# Patient Record
Sex: Male | Born: 1935 | Race: White | Hispanic: No | Marital: Married | Smoking: Former smoker
Health system: Southern US, Community
[De-identification: ages and names within clinical notes are randomized; demographics above are authoritative.]

## PROBLEM LIST (undated history)

## (undated) DIAGNOSIS — C61 Malignant neoplasm of prostate: Secondary | ICD-10-CM

## (undated) DIAGNOSIS — C801 Malignant (primary) neoplasm, unspecified: Secondary | ICD-10-CM

## (undated) DIAGNOSIS — E119 Type 2 diabetes mellitus without complications: Secondary | ICD-10-CM

## (undated) DIAGNOSIS — S32000A Wedge compression fracture of unspecified lumbar vertebra, initial encounter for closed fracture: Secondary | ICD-10-CM

## (undated) DIAGNOSIS — F419 Anxiety disorder, unspecified: Secondary | ICD-10-CM

## (undated) DIAGNOSIS — H269 Unspecified cataract: Secondary | ICD-10-CM

## (undated) DIAGNOSIS — I10 Essential (primary) hypertension: Secondary | ICD-10-CM

## (undated) DIAGNOSIS — G4733 Obstructive sleep apnea (adult) (pediatric): Secondary | ICD-10-CM

## (undated) DIAGNOSIS — F32A Depression, unspecified: Secondary | ICD-10-CM

## (undated) DIAGNOSIS — I951 Orthostatic hypotension: Secondary | ICD-10-CM

## (undated) DIAGNOSIS — I4891 Unspecified atrial fibrillation: Secondary | ICD-10-CM

## (undated) DIAGNOSIS — K219 Gastro-esophageal reflux disease without esophagitis: Secondary | ICD-10-CM

## (undated) DIAGNOSIS — G4730 Sleep apnea, unspecified: Secondary | ICD-10-CM

## (undated) DIAGNOSIS — M25511 Pain in right shoulder: Secondary | ICD-10-CM

## (undated) DIAGNOSIS — E7800 Pure hypercholesterolemia, unspecified: Secondary | ICD-10-CM

## (undated) DIAGNOSIS — M1612 Unilateral primary osteoarthritis, left hip: Secondary | ICD-10-CM

## (undated) DIAGNOSIS — R32 Unspecified urinary incontinence: Secondary | ICD-10-CM

## (undated) DIAGNOSIS — M109 Gout, unspecified: Secondary | ICD-10-CM

## (undated) DIAGNOSIS — N183 Chronic kidney disease, stage 3 unspecified: Secondary | ICD-10-CM

## (undated) HISTORY — DX: Depression, unspecified: F32.A

## (undated) HISTORY — DX: Gout, unspecified: M10.9

## (undated) HISTORY — PX: CHOLECYSTECTOMY, LAPAROSCOPIC: SHX56

## (undated) HISTORY — DX: Unspecified atrial fibrillation: I48.91

## (undated) HISTORY — DX: Malignant (primary) neoplasm, unspecified: C80.1

## (undated) HISTORY — DX: Unspecified urinary incontinence: R32

## (undated) HISTORY — PX: OTHER SURGICAL HISTORY: SHX169

## (undated) HISTORY — DX: Type 2 diabetes mellitus without complications: E11.9

## (undated) HISTORY — DX: Essential (primary) hypertension: I10

## (undated) HISTORY — DX: Unspecified cataract: H26.9

## (undated) HISTORY — DX: Wedge compression fracture of unspecified lumbar vertebra, initial encounter for closed fracture: S32.000A

## (undated) HISTORY — DX: Anxiety disorder, unspecified: F41.9

## (undated) HISTORY — DX: Sleep apnea, unspecified: G47.30

## (undated) HISTORY — DX: Pure hypercholesterolemia, unspecified: E78.00

## (undated) HISTORY — DX: Unilateral primary osteoarthritis, left hip: M16.12

## (undated) HISTORY — DX: Obstructive sleep apnea (adult) (pediatric): G47.33

## (undated) HISTORY — PX: ROBOT ASSISTED LAPAROSCOPIC RADICAL PROSTATECTOMY: SHX5141

## (undated) HISTORY — DX: Pain in right shoulder: M25.511

## (undated) HISTORY — DX: Orthostatic hypotension: I95.1

## (undated) HISTORY — PX: TOTAL KNEE ARTHROPLASTY: SHX125

## (undated) HISTORY — DX: Malignant neoplasm of prostate: C61

## (undated) HISTORY — PX: TRANSTHORACIC ECHOCARDIOGRAM: SHX275

## (undated) HISTORY — PX: ROTATOR CUFF REPAIR: SHX139

## (undated) HISTORY — DX: Gastro-esophageal reflux disease without esophagitis: K21.9

## (undated) HISTORY — DX: Chronic kidney disease, stage 3 unspecified: N18.30

---

## 2010-09-19 DIAGNOSIS — J209 Acute bronchitis, unspecified: Secondary | ICD-10-CM | POA: Insufficient documentation

## 2010-09-19 DIAGNOSIS — J301 Allergic rhinitis due to pollen: Secondary | ICD-10-CM | POA: Insufficient documentation

## 2010-09-19 DIAGNOSIS — J019 Acute sinusitis, unspecified: Secondary | ICD-10-CM | POA: Insufficient documentation

## 2010-09-19 DIAGNOSIS — R059 Cough, unspecified: Secondary | ICD-10-CM | POA: Insufficient documentation

## 2010-09-24 DIAGNOSIS — J9801 Acute bronchospasm: Secondary | ICD-10-CM | POA: Insufficient documentation

## 2016-05-31 DIAGNOSIS — H9193 Unspecified hearing loss, bilateral: Secondary | ICD-10-CM | POA: Insufficient documentation

## 2016-05-31 DIAGNOSIS — Z85828 Personal history of other malignant neoplasm of skin: Secondary | ICD-10-CM | POA: Insufficient documentation

## 2016-05-31 DIAGNOSIS — E559 Vitamin D deficiency, unspecified: Secondary | ICD-10-CM | POA: Insufficient documentation

## 2016-05-31 DIAGNOSIS — F411 Generalized anxiety disorder: Secondary | ICD-10-CM | POA: Insufficient documentation

## 2016-05-31 DIAGNOSIS — Z8546 Personal history of malignant neoplasm of prostate: Secondary | ICD-10-CM | POA: Insufficient documentation

## 2020-02-21 DIAGNOSIS — C61 Malignant neoplasm of prostate: Secondary | ICD-10-CM | POA: Insufficient documentation

## 2020-02-21 DIAGNOSIS — E663 Overweight: Secondary | ICD-10-CM | POA: Insufficient documentation

## 2020-02-21 DIAGNOSIS — I440 Atrioventricular block, first degree: Secondary | ICD-10-CM | POA: Insufficient documentation

## 2020-02-21 DIAGNOSIS — I454 Nonspecific intraventricular block: Secondary | ICD-10-CM | POA: Insufficient documentation

## 2020-08-21 DIAGNOSIS — H52223 Regular astigmatism, bilateral: Secondary | ICD-10-CM | POA: Diagnosis not present

## 2020-08-26 DIAGNOSIS — R918 Other nonspecific abnormal finding of lung field: Secondary | ICD-10-CM | POA: Diagnosis not present

## 2020-08-26 DIAGNOSIS — Z0100 Encounter for examination of eyes and vision without abnormal findings: Secondary | ICD-10-CM | POA: Diagnosis not present

## 2020-08-28 DIAGNOSIS — Z9181 History of falling: Secondary | ICD-10-CM | POA: Diagnosis not present

## 2020-08-28 DIAGNOSIS — E1142 Type 2 diabetes mellitus with diabetic polyneuropathy: Secondary | ICD-10-CM | POA: Diagnosis not present

## 2020-10-28 DIAGNOSIS — E119 Type 2 diabetes mellitus without complications: Secondary | ICD-10-CM | POA: Diagnosis not present

## 2020-10-28 DIAGNOSIS — I4460 Unspecified fascicular block: Secondary | ICD-10-CM | POA: Diagnosis not present

## 2020-10-28 DIAGNOSIS — I712 Thoracic aortic aneurysm, without rupture: Secondary | ICD-10-CM | POA: Diagnosis not present

## 2020-10-28 DIAGNOSIS — I440 Atrioventricular block, first degree: Secondary | ICD-10-CM | POA: Diagnosis not present

## 2020-10-28 DIAGNOSIS — I10 Essential (primary) hypertension: Secondary | ICD-10-CM | POA: Diagnosis not present

## 2020-10-28 DIAGNOSIS — I2510 Atherosclerotic heart disease of native coronary artery without angina pectoris: Secondary | ICD-10-CM | POA: Diagnosis not present

## 2020-10-28 DIAGNOSIS — E7800 Pure hypercholesterolemia, unspecified: Secondary | ICD-10-CM | POA: Diagnosis not present

## 2020-11-04 DIAGNOSIS — E119 Type 2 diabetes mellitus without complications: Secondary | ICD-10-CM | POA: Diagnosis not present

## 2020-12-09 DIAGNOSIS — H16221 Keratoconjunctivitis sicca, not specified as Sjogren's, right eye: Secondary | ICD-10-CM | POA: Diagnosis not present

## 2020-12-18 DIAGNOSIS — E1142 Type 2 diabetes mellitus with diabetic polyneuropathy: Secondary | ICD-10-CM | POA: Diagnosis not present

## 2021-01-15 DIAGNOSIS — Z79899 Other long term (current) drug therapy: Secondary | ICD-10-CM | POA: Diagnosis not present

## 2021-01-15 DIAGNOSIS — E1165 Type 2 diabetes mellitus with hyperglycemia: Secondary | ICD-10-CM | POA: Diagnosis not present

## 2021-01-15 DIAGNOSIS — M109 Gout, unspecified: Secondary | ICD-10-CM | POA: Diagnosis not present

## 2021-01-15 DIAGNOSIS — E7800 Pure hypercholesterolemia, unspecified: Secondary | ICD-10-CM | POA: Diagnosis not present

## 2021-01-15 DIAGNOSIS — E559 Vitamin D deficiency, unspecified: Secondary | ICD-10-CM | POA: Diagnosis not present

## 2021-01-29 DIAGNOSIS — M24812 Other specific joint derangements of left shoulder, not elsewhere classified: Secondary | ICD-10-CM | POA: Diagnosis not present

## 2021-01-29 DIAGNOSIS — I712 Thoracic aortic aneurysm, without rupture: Secondary | ICD-10-CM | POA: Diagnosis not present

## 2021-01-29 DIAGNOSIS — Z8546 Personal history of malignant neoplasm of prostate: Secondary | ICD-10-CM | POA: Diagnosis not present

## 2021-01-29 DIAGNOSIS — L570 Actinic keratosis: Secondary | ICD-10-CM | POA: Diagnosis not present

## 2021-01-29 DIAGNOSIS — M109 Gout, unspecified: Secondary | ICD-10-CM | POA: Diagnosis not present

## 2021-01-29 DIAGNOSIS — I10 Essential (primary) hypertension: Secondary | ICD-10-CM | POA: Diagnosis not present

## 2021-01-29 DIAGNOSIS — E113299 Type 2 diabetes mellitus with mild nonproliferative diabetic retinopathy without macular edema, unspecified eye: Secondary | ICD-10-CM | POA: Diagnosis not present

## 2021-01-29 DIAGNOSIS — I700 Atherosclerosis of aorta: Secondary | ICD-10-CM | POA: Diagnosis not present

## 2021-01-29 DIAGNOSIS — Z1211 Encounter for screening for malignant neoplasm of colon: Secondary | ICD-10-CM | POA: Diagnosis not present

## 2021-01-29 DIAGNOSIS — M1990 Unspecified osteoarthritis, unspecified site: Secondary | ICD-10-CM | POA: Diagnosis not present

## 2021-01-29 DIAGNOSIS — E1140 Type 2 diabetes mellitus with diabetic neuropathy, unspecified: Secondary | ICD-10-CM | POA: Diagnosis not present

## 2021-01-29 DIAGNOSIS — M25819 Other specified joint disorders, unspecified shoulder: Secondary | ICD-10-CM | POA: Diagnosis not present

## 2021-01-29 DIAGNOSIS — Z0000 Encounter for general adult medical examination without abnormal findings: Secondary | ICD-10-CM | POA: Diagnosis not present

## 2021-01-29 DIAGNOSIS — N183 Chronic kidney disease, stage 3 unspecified: Secondary | ICD-10-CM | POA: Diagnosis not present

## 2021-01-29 DIAGNOSIS — H8112 Benign paroxysmal vertigo, left ear: Secondary | ICD-10-CM | POA: Diagnosis not present

## 2021-01-29 DIAGNOSIS — G4733 Obstructive sleep apnea (adult) (pediatric): Secondary | ICD-10-CM | POA: Diagnosis not present

## 2021-01-29 DIAGNOSIS — E7800 Pure hypercholesterolemia, unspecified: Secondary | ICD-10-CM | POA: Diagnosis not present

## 2021-01-29 DIAGNOSIS — Z125 Encounter for screening for malignant neoplasm of prostate: Secondary | ICD-10-CM | POA: Diagnosis not present

## 2021-01-29 DIAGNOSIS — E559 Vitamin D deficiency, unspecified: Secondary | ICD-10-CM | POA: Diagnosis not present

## 2021-01-29 DIAGNOSIS — E1139 Type 2 diabetes mellitus with other diabetic ophthalmic complication: Secondary | ICD-10-CM | POA: Diagnosis not present

## 2021-01-29 DIAGNOSIS — T783XXA Angioneurotic edema, initial encounter: Secondary | ICD-10-CM | POA: Diagnosis not present

## 2021-01-29 DIAGNOSIS — H9193 Unspecified hearing loss, bilateral: Secondary | ICD-10-CM | POA: Diagnosis not present

## 2021-01-29 DIAGNOSIS — K219 Gastro-esophageal reflux disease without esophagitis: Secondary | ICD-10-CM | POA: Diagnosis not present

## 2021-01-29 DIAGNOSIS — Z8673 Personal history of transient ischemic attack (TIA), and cerebral infarction without residual deficits: Secondary | ICD-10-CM | POA: Diagnosis not present

## 2021-01-29 DIAGNOSIS — R69 Illness, unspecified: Secondary | ICD-10-CM | POA: Diagnosis not present

## 2021-01-29 DIAGNOSIS — E1122 Type 2 diabetes mellitus with diabetic chronic kidney disease: Secondary | ICD-10-CM | POA: Diagnosis not present

## 2021-01-29 DIAGNOSIS — Z85828 Personal history of other malignant neoplasm of skin: Secondary | ICD-10-CM | POA: Diagnosis not present

## 2021-01-29 DIAGNOSIS — Z79899 Other long term (current) drug therapy: Secondary | ICD-10-CM | POA: Diagnosis not present

## 2021-01-29 DIAGNOSIS — E1142 Type 2 diabetes mellitus with diabetic polyneuropathy: Secondary | ICD-10-CM | POA: Diagnosis not present

## 2021-03-27 DIAGNOSIS — W1830XA Fall on same level, unspecified, initial encounter: Secondary | ICD-10-CM | POA: Diagnosis not present

## 2021-03-27 DIAGNOSIS — Z743 Need for continuous supervision: Secondary | ICD-10-CM | POA: Diagnosis not present

## 2021-03-27 DIAGNOSIS — R220 Localized swelling, mass and lump, head: Secondary | ICD-10-CM | POA: Diagnosis not present

## 2021-03-27 DIAGNOSIS — S0990XA Unspecified injury of head, initial encounter: Secondary | ICD-10-CM | POA: Diagnosis not present

## 2021-03-27 DIAGNOSIS — R55 Syncope and collapse: Secondary | ICD-10-CM | POA: Diagnosis not present

## 2021-03-27 DIAGNOSIS — Y92511 Restaurant or cafe as the place of occurrence of the external cause: Secondary | ICD-10-CM | POA: Diagnosis not present

## 2021-03-27 DIAGNOSIS — I440 Atrioventricular block, first degree: Secondary | ICD-10-CM | POA: Diagnosis not present

## 2021-03-27 DIAGNOSIS — R42 Dizziness and giddiness: Secondary | ICD-10-CM | POA: Diagnosis not present

## 2021-03-27 DIAGNOSIS — I4510 Unspecified right bundle-branch block: Secondary | ICD-10-CM | POA: Diagnosis not present

## 2021-09-09 ENCOUNTER — Encounter: Payer: Self-pay | Admitting: Family Medicine

## 2021-09-09 ENCOUNTER — Ambulatory Visit (INDEPENDENT_AMBULATORY_CARE_PROVIDER_SITE_OTHER): Payer: Medicare HMO | Admitting: Family Medicine

## 2021-09-09 VITALS — BP 138/82 | HR 100 | Temp 98.1°F | Resp 16 | Ht 73.0 in | Wt 268.4 lb

## 2021-09-09 DIAGNOSIS — R42 Dizziness and giddiness: Secondary | ICD-10-CM | POA: Diagnosis not present

## 2021-09-09 DIAGNOSIS — I712 Thoracic aortic aneurysm, without rupture, unspecified: Secondary | ICD-10-CM

## 2021-09-09 DIAGNOSIS — M109 Gout, unspecified: Secondary | ICD-10-CM

## 2021-09-09 DIAGNOSIS — I10 Essential (primary) hypertension: Secondary | ICD-10-CM

## 2021-09-09 DIAGNOSIS — E1169 Type 2 diabetes mellitus with other specified complication: Secondary | ICD-10-CM

## 2021-09-09 DIAGNOSIS — E669 Obesity, unspecified: Secondary | ICD-10-CM

## 2021-09-09 DIAGNOSIS — Z9181 History of falling: Secondary | ICD-10-CM | POA: Diagnosis not present

## 2021-09-09 DIAGNOSIS — E785 Hyperlipidemia, unspecified: Secondary | ICD-10-CM | POA: Diagnosis not present

## 2021-09-09 LAB — MICROALBUMIN / CREATININE URINE RATIO
Creatinine,U: 243 mg/dL
Microalb Creat Ratio: 17.4 mg/g (ref 0.0–30.0)
Microalb, Ur: 42.4 mg/dL — ABNORMAL HIGH (ref 0.0–1.9)

## 2021-09-09 LAB — COMPREHENSIVE METABOLIC PANEL
ALT: 16 U/L (ref 0–53)
AST: 17 U/L (ref 0–37)
Albumin: 3.9 g/dL (ref 3.5–5.2)
Alkaline Phosphatase: 84 U/L (ref 39–117)
BUN: 18 mg/dL (ref 6–23)
CO2: 24 mEq/L (ref 19–32)
Calcium: 9.2 mg/dL (ref 8.4–10.5)
Chloride: 100 mEq/L (ref 96–112)
Creatinine, Ser: 1.4 mg/dL (ref 0.40–1.50)
GFR: 46.42 mL/min — ABNORMAL LOW (ref >60.00)
Glucose, Bld: 361 mg/dL — ABNORMAL HIGH (ref 70–99)
Potassium: 4.2 mEq/L (ref 3.5–5.1)
Sodium: 135 mEq/L (ref 135–145)
Total Bilirubin: 0.6 mg/dL (ref 0.2–1.2)
Total Protein: 6.7 g/dL (ref 6.0–8.3)

## 2021-09-09 LAB — CBC
HCT: 43.2 % (ref 39.0–52.0)
Hemoglobin: 13.8 g/dL (ref 13.0–17.0)
MCHC: 31.9 g/dL (ref 30.0–36.0)
MCV: 90.4 fl (ref 78.0–100.0)
Platelets: 289 10*3/uL (ref 150.0–400.0)
RBC: 4.78 Mil/uL (ref 4.22–5.81)
RDW: 15 % (ref 11.5–15.5)
WBC: 7.1 10*3/uL (ref 4.0–10.5)

## 2021-09-09 LAB — LIPID PANEL
Cholesterol: 139 mg/dL (ref 0–200)
HDL: 37.4 mg/dL — ABNORMAL LOW (ref >39.00)
LDL Cholesterol: 72 mg/dL (ref 0–99)
NonHDL: 101.91
Total CHOL/HDL Ratio: 4
Triglycerides: 149 mg/dL (ref 0.0–149.0)
VLDL: 29.8 mg/dL (ref 0.0–40.0)

## 2021-09-09 LAB — HEMOGLOBIN A1C: Hgb A1c MFr Bld: 7.5 % — ABNORMAL HIGH (ref 4.6–6.5)

## 2021-09-09 MED ORDER — METOPROLOL TARTRATE 25 MG PO TABS: 25.0000 mg | ORAL_TABLET | Freq: Two times a day (BID) | ORAL | 1 refills | Status: DC

## 2021-09-09 MED ORDER — MECLIZINE HCL 12.5 MG PO TABS: 12.5000 mg | ORAL_TABLET | Freq: Three times a day (TID) | ORAL | 1 refills | Status: DC

## 2021-09-09 MED ORDER — GLIMEPIRIDE 4 MG PO TABS: 4.0000 mg | ORAL_TABLET | Freq: Two times a day (BID) | ORAL | 1 refills | Status: DC

## 2021-09-09 MED ORDER — OMEPRAZOLE 20 MG PO CPDR: 20.0000 mg | DELAYED_RELEASE_CAPSULE | Freq: Every day | ORAL | 1 refills | Status: DC

## 2021-09-09 MED ORDER — ALLOPURINOL 300 MG PO TABS: 300.0000 mg | ORAL_TABLET | Freq: Every day | ORAL | 1 refills | Status: DC

## 2021-09-09 MED ORDER — PIOGLITAZONE HCL 15 MG PO TABS: 15.0000 mg | ORAL_TABLET | Freq: Every day | ORAL | 1 refills | Status: DC

## 2021-09-09 MED ORDER — ATORVASTATIN CALCIUM 40 MG PO TABS: 40.0000 mg | ORAL_TABLET | Freq: Every day | ORAL | 1 refills | Status: DC

## 2021-09-09 MED ORDER — METFORMIN HCL ER 500 MG PO TB24
1000.0000 mg | ORAL_TABLET | Freq: Two times a day (BID) | ORAL | 1 refills | Status: DC
Start: 2021-09-09 — End: 2022-01-15

## 2021-09-09 MED ORDER — LOSARTAN POTASSIUM 100 MG PO TABS: 100.0000 mg | ORAL_TABLET | Freq: Every day | ORAL | 1 refills | Status: DC

## 2021-09-09 NOTE — Patient Instructions (Addendum)
We will need to get a copy of prior CT, but will refer you to cardiology for ongoing monitoring.  Updated labs today, no med changes for now.  I will refer you to ENT to discuss the vertigo/dizziness.  Return to the clinic or go to the nearest emergency room if any of your symptoms worsen or new symptoms occur.  Recheck in 3 months depending on labs.   Thanks for coming in today and nice meeting you.   Fall Prevention in the Home, Adult Falls can cause injuries and affect people of all ages. There are many simple things that you can do to make your home safe and to help prevent falls. Ask for help when making these changes, if needed. What actions can I take to prevent falls? General instructions Use good lighting in all rooms. Replace any light bulbs that burn out, turn on lights if it is dark, and use night-lights. Place frequently used items in easy-to-reach places. Lower the shelves around your home if necessary. Set up furniture so that there are clear paths around it. Avoid moving your furniture around. Remove throw rugs and other tripping hazards from the floor. Avoid walking on wet floors. Fix any uneven floor surfaces. Add color or contrast paint or tape to grab bars and handrails in your home. Place contrasting color strips on the first and last steps of staircases. When you use a stepladder, make sure that it is completely opened and that the sides and supports are firmly locked. Have someone hold the ladder while you are using it. Do not climb a closed stepladder. Know where your pets are when moving through your home. What can I do in the bathroom?   Keep the floor dry. Immediately clean up any water that is on the floor. Remove soap buildup in the tub or shower regularly. Use nonskid mats or decals on the floor of the tub or shower. Attach bath mats securely with double-sided, nonslip rug tape. If you need to sit down while you are in the shower, use a plastic, nonslip  stool. Install grab bars by the toilet and in the tub and shower. Do not use towel bars as grab bars. What can I do in the bedroom? Make sure that a bedside light is easy to reach. Do not use oversized bedding that reaches the floor. Have a firm chair that has side arms to use for getting dressed. What can I do in the kitchen? Clean up any spills right away. If you need to reach for something above you, use a sturdy step stool that has a grab bar. Keep electrical cables out of the way. Do not use floor polish or wax that makes floors slippery. If you must use wax, make sure that it is non-skid floor wax. What can I do with my stairs? Do not leave any items on the stairs. Make sure that you have a light switch at the top and the bottom of the stairs. Have them installed if you do not have them. Make sure that there are handrails on both sides of the stairs. Fix handrails that are broken or loose. Make sure that handrails are as long as the staircases. Install non-slip stair treads on all stairs in your home. Avoid having throw rugs at the top or bottom of stairs, or secure the rugs with carpet tape to prevent them from moving. Choose a carpet design that does not hide the edge of steps on the stairs. Check any carpeting to  make sure that it is firmly attached to the stairs. Fix any carpet that is loose or worn. What can I do on the outside of my home? Use bright outdoor lighting. Regularly repair the edges of walkways and driveways and fix any cracks. Remove high doorway thresholds. Trim any shrubbery on the main path into your home. Regularly check that handrails are securely fastened and in good repair. Both sides of all steps should have handrails. Install guardrails along the edges of any raised decks or porches. Clear walkways of debris and clutter, including tools and rocks. Have leaves, snow, and ice cleared regularly. Use sand or salt on walkways during winter months. In the  garage, clean up any spills right away, including grease or oil spills. What other actions can I take? Wear closed-toe shoes that fit well and support your feet. Wear shoes that have rubber soles or low heels. Use mobility aids as needed, such as canes, walkers, scooters, and crutches. Review your medicines with your health care provider. Some medicines can cause dizziness or changes in blood pressure, which increase your risk of falling. Talk with your health care provider about other ways that you can decrease your risk of falls. This may include working with a physical therapist or trainer to improve your strength, balance, and endurance. Where to find more information Centers for Disease Control and Prevention, STEADI: http://www.wolf.info/ National Institute on Aging: http://kim-miller.com/ Contact a health care provider if: You are afraid of falling at home. You feel weak, drowsy, or dizzy at home. You fall at home. Summary There are many simple things that you can do to make your home safe and to help prevent falls. Ways to make your home safe include removing tripping hazards and installing grab bars in the bathroom. Ask for help when making these changes in your home. This information is not intended to replace advice given to you by your health care provider. Make sure you discuss any questions you have with your health care provider. Document Revised: 03/05/2020 Document Reviewed: 03/05/2020 Elsevier Patient Education  [place].

## 2021-09-09 NOTE — Progress Notes (Signed)
Subjective:  Patient ID: Jason [name], male    DOB: [birth year]  Age: 86 y.o. MRN: [id number]  CC:  Chief Complaint  Patient presents with   New Patient (Initial Visit)    Pt here to establish care, reports moved here from [facility] reports he is due for several refills today     HPI Jason [name] presents for   New patient to establish care. Prior  PCP in [place]. Moved from [name], [name]. Last visit 6-8 months ago. Moved here to be closer to family - daughter here locally.  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  Prior Social research officer, government - 4 years.   Hypertension: Losartan 100 mg daily, Lopressor 25 mg twice daily Hx of OSA on CPAP nightly. Working well.  No new med side effects.  Home readings: diastolic in 30'S.  No hx of MI/PTCA/CAD known  BP Readings from Last 3 Encounters:  09/09/21 138/82   No results found for: CREATININE  Hyperlipidemia: Lipitor 40mg  qd. No new myalgias/side effects.  No results found for: CHOL, HDL, LDLCALC, LDLDIRECT, TRIG, CHOLHDL No results found for: ALT, AST, GGT, ALKPHOS, BILITOT  GERD Controlled with daily omeprazole. Takes B12 supplement otc QD. Vit D supplement otc.   Diabetes: With obesity, no other complications.  Treated with Metformin, amaryl, actos.  On statin, ARB.  Home readings:  Fasting 158 today.  Postprandial - none.  No symptomatic lows.   No results found for: HGBA1C No results found for: GLUF, MICROALBUR, LDLCALC, CREATININE  Gout: Last flare: none recently.  Daily meds:allopurinol 300mg  qd.  Prn med: none needed.  No results found for: LABURIC  Vertigo: Past few years, with falls in past. Has discussed with prior PCP - treated with meclizine. Unsure if helps. Room spinning sensation.  Dizzy at times. Resolves with rest, no CP or palpitations.  No change in hearing - wears hearing aids. 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 Improves with hearing aids.   Aortic  aneurysm: Thoracic, Found on imaging in past, treated by cardiology in [place]. Had CT, advised he is due for repeat CT chest. Unknown prior measurements.   Health maintenance: Flu vaccine in September 2022.  Covid vaccine and booster, most recent booster 06/26/20 - bivalent booster recommended. Plans to receive.   History There are no problems to display for this patient.  Past Medical History:  Diagnosis Date   Cancer ([place])    Cataract    Diabetes mellitus without complication ([place])    Hypertension    Sleep apnea    History reviewed. No pertinent surgical history. No Known Allergies Prior to Admission medications   Medication Sig Start Date End Date Taking? Authorizing Provider  allopurinol (ZYLOPRIM) 300 MG tablet Take 1 tablet by mouth daily. 06/07/12  Yes [provider]  Ascorbic Acid (VITAMIN C) 100 MG tablet Take 100 mg by mouth daily.   Yes [provider]  aspirin 81 MG chewable tablet Chew by mouth daily.   Yes [provider]  atorvastatin (LIPITOR) 40 MG tablet Take 1 tablet by mouth daily. 05/01/21  Yes [provider]  Cholecalciferol (VITAMIN D3) 1.25 MG (50000 UT) CAPS Take by mouth.   Yes [provider]  Cobalamin Combinations (B-12) [phone] MCG SUBL Take 1 tablet by mouth daily.   Yes [provider]  glimepiride (AMARYL) 4 MG tablet Take 1 tablet by mouth in the morning and at bedtime. 05/06/20  Yes [provider]  losartan (COZAAR) 100 MG tablet Take 1 tablet by mouth daily. 02/21/20  Yes [provider]  meclizine (ANTIVERT) 12.5 MG tablet Take 1 tablet by mouth in the morning, at noon, and at bedtime.   Yes [provider]  metFORMIN (GLUCOPHAGE-XR) 500 MG 24 hr tablet Take 1,000 mg by mouth in the morning and at bedtime. 05/01/21  Yes [provider]  metoprolol tartrate (LOPRESSOR) 25 MG tablet Take 25 mg by mouth 2 (two) times daily. 08/25/21  Yes [provider]   omeprazole (PRILOSEC) 20 MG capsule Take 20 mg by mouth daily. 08/18/21  Yes [provider]  pioglitazone (ACTOS) 15 MG tablet Take 1 tablet by mouth daily.   Yes [provider]   Social History   Socioeconomic History   Marital status: Married    Spouse name: Not on file   Number of children: Not on file   Years of education: Not on file   Highest education level: Not on file  Occupational History   Not on file  Tobacco Use   Smoking status: Former    Types: Pipe   Smokeless tobacco: Never  Substance and Sexual Activity   Alcohol use: Yes    Alcohol/week: 2.0 standard drinks    Types: 2 Glasses of wine per week   Drug use: Never   Sexual activity: Never  Other Topics Concern   Not on file  Social History Narrative   Not on file   Social Determinants of Health   Financial Resource Strain: Not on file  Food Insecurity: Not on file  Transportation Needs: Not on file  Physical Activity: Not on file  Stress: Not on file  Social Connections: Not on file  Intimate Partner Violence: Not on file    Review of Systems  Constitutional:  Negative for fatigue and unexpected weight change.  HENT:  Positive for tinnitus.   Eyes:  Negative for visual disturbance.  Respiratory:  Negative for cough, chest tightness and shortness of breath.   Cardiovascular:  Negative for chest pain, palpitations and leg swelling.  Gastrointestinal:  Negative for abdominal pain and blood in stool.  Neurological:  Positive for dizziness (vertigo,  intermittent.). Negative for facial asymmetry, speech difficulty, weakness, light-headedness and headaches.    Objective:   Vitals:   09/09/21 0920  BP: 138/82  Pulse: 100  Resp: 16  Temp: 98.1 F (36.7 C)  TempSrc: Temporal  SpO2: 97%  Weight: 268 lb 6.4 oz (121.7 kg)  Height: 6\' 1"  (1.854 m)     Physical Exam Vitals reviewed.  Constitutional:      Appearance: He is well-developed. He is obese.  HENT:     Head:  Normocephalic and atraumatic.     Right Ear: Ear canal and external ear normal.     Left Ear: Ear canal and external ear normal.  Eyes:     Extraocular Movements: Extraocular movements intact.     Pupils: Pupils are equal, round, and reactive to light.  Neck:     Vascular: No carotid bruit or JVD.  Cardiovascular:     Rate and Rhythm: Normal rate and regular rhythm.     Heart sounds: Normal heart sounds. No murmur heard. Pulmonary:     Effort: Pulmonary effort is normal.     Breath sounds: Normal breath sounds. No rales.  Abdominal:     General: Abdomen is flat.     Tenderness: There is no abdominal tenderness.  Musculoskeletal:  Right lower leg: No edema.     Left lower leg: No edema.  Skin:    General: Skin is warm and dry.  Neurological:     General: No focal deficit present.     Mental Status: He is alert and oriented to person, place, and time.     Cranial Nerves: No dysarthria.     Motor: No weakness or pronator drift.     Coordination: Finger-Nose-Finger Test and Heel to [place] Test normal.     Gait: Gait is intact.  Psychiatric:        Mood and Affect: Mood normal.        Behavior: Behavior normal.       Assessment & Plan:  Jason [name] is a 86 y.o. male . Type 2 diabetes mellitus with obesity ([place]) - Plan: Comprehensive metabolic panel, Hemoglobin A1c, Microalbumin / creatinine urine ratio, glimepiride (AMARYL) 4 MG tablet  -Check updated labs.  Continue same med regimen for now.  Essential hypertension - Plan: Comprehensive metabolic panel  -Stable, continue same meds, check labs  Hyperlipidemia, unspecified hyperlipidemia type - Plan: Lipid panel, atorvastatin (LIPITOR) 40 MG tablet  - tolerating statin, continue same dose.  Check lipids.  Vertigo - Plan: Ambulatory referral to ENT, CBC, meclizine (ANTIVERT) 12.5 MG tablet History of fall  -Reports history of vertigo diagnosis but with persistent symptoms, recurrence of falls, will refer to ENT for further  evaluation and decision on other testing or imaging.  Nonfocal exam at present.  Continue meclizine for now.  RTC/ER precautions and fall precautions given on handout.  Thoracic aortic aneurysm without rupture, unspecified part - Plan: Ambulatory referral to Cardiology  -Refer to cardiology for ongoing monitoring and likely will need repeat imaging ordered.  Gout, unspecified cause, unspecified chronicity, unspecified site - Plan: allopurinol (ZYLOPRIM) 300 MG tablet  -Stable without recent flare on allopurinol.  Continue same.  Meds ordered this encounter  Medications   glimepiride (AMARYL) 4 MG tablet    Sig: Take 1 tablet (4 mg total) by mouth in the morning and at bedtime.    Dispense:  180 tablet    Refill:  1   meclizine (ANTIVERT) 12.5 MG tablet    Sig: Take 1 tablet (12.5 mg total) by mouth in the morning, at noon, and at bedtime.    Dispense:  90 tablet    Refill:  1   allopurinol (ZYLOPRIM) 300 MG tablet    Sig: Take 1 tablet (300 mg total) by mouth daily.    Dispense:  90 tablet    Refill:  1   atorvastatin (LIPITOR) 40 MG tablet    Sig: Take 1 tablet (40 mg total) by mouth daily.    Dispense:  90 tablet    Refill:  1   losartan (COZAAR) 100 MG tablet    Sig: Take 1 tablet (100 mg total) by mouth daily.    Dispense:  90 tablet    Refill:  1   pioglitazone (ACTOS) 15 MG tablet    Sig: Take 1 tablet (15 mg total) by mouth daily.    Dispense:  90 tablet    Refill:  1   metFORMIN (GLUCOPHAGE-XR) 500 MG 24 hr tablet    Sig: Take 2 tablets (1,000 mg total) by mouth in the morning and at bedtime.    Dispense:  360 tablet    Refill:  1   omeprazole (PRILOSEC) 20 MG capsule    Sig: Take 1 capsule (20 mg total) by mouth  daily.    Dispense:  90 capsule    Refill:  1   metoprolol tartrate (LOPRESSOR) 25 MG tablet    Sig: Take 1 tablet (25 mg total) by mouth 2 (two) times daily.    Dispense:  180 tablet    Refill:  1   Patient Instructions  We will need to get a copy of  prior CT, but will refer you to cardiology for ongoing monitoring.  Updated labs today, no med changes for now.  I will refer you to ENT to discuss the vertigo/dizziness.  Return to the clinic or go to the nearest emergency room if any of your symptoms worsen or new symptoms occur.  Recheck in 3 months depending on labs.   Thanks for coming in today and nice meeting you.   Fall Prevention in the Home, Adult Falls can cause injuries and affect people of all ages. There are many simple things that you can do to make your home safe and to help prevent falls. Ask for help when making these changes, if needed. What actions can I take to prevent falls? General instructions Use good lighting in all rooms. Replace any light bulbs that burn out, turn on lights if it is dark, and use night-lights. Place frequently used items in easy-to-reach places. Lower the shelves around your home if necessary. Set up furniture so that there are clear paths around it. Avoid moving your furniture around. Remove throw rugs and other tripping hazards from the floor. Avoid walking on wet floors. Fix any uneven floor surfaces. Add color or contrast paint or tape to grab bars and handrails in your home. Place contrasting color strips on the first and last steps of staircases. When you use a stepladder, make sure that it is completely opened and that the sides and supports are firmly locked. Have someone hold the ladder while you are using it. Do not climb a closed stepladder. Know where your pets are when moving through your home. What can I do in the bathroom?   Keep the floor dry. Immediately clean up any water that is on the floor. Remove soap buildup in the tub or shower regularly. Use nonskid mats or decals on the floor of the tub or shower. Attach bath mats securely with double-sided, nonslip rug tape. If you need to sit down while you are in the shower, use a plastic, nonslip stool. Install grab bars by the  toilet and in the tub and shower. Do not use towel bars as grab bars. What can I do in the bedroom? Make sure that a bedside light is easy to reach. Do not use oversized bedding that reaches the floor. Have a firm chair that has side arms to use for getting dressed. What can I do in the kitchen? Clean up any spills right away. If you need to reach for something above you, use a sturdy step stool that has a grab bar. Keep electrical cables out of the way. Do not use floor polish or wax that makes floors slippery. If you must use wax, make sure that it is non-skid floor wax. What can I do with my stairs? Do not leave any items on the stairs. Make sure that you have a light switch at the top and the bottom of the stairs. Have them installed if you do not have them. Make sure that there are handrails on both sides of the stairs. Fix handrails that are broken or loose. Make sure that handrails are  as long as the staircases. Install non-slip stair treads on all stairs in your home. Avoid having throw rugs at the top or bottom of stairs, or secure the rugs with carpet tape to prevent them from moving. Choose a carpet design that does not hide the edge of steps on the stairs. Check any carpeting to make sure that it is firmly attached to the stairs. Fix any carpet that is loose or worn. What can I do on the outside of my home? Use bright outdoor lighting. Regularly repair the edges of walkways and driveways and fix any cracks. Remove high doorway thresholds. Trim any shrubbery on the main path into your home. Regularly check that handrails are securely fastened and in good repair. Both sides of all steps should have handrails. Install guardrails along the edges of any raised decks or porches. Clear walkways of debris and clutter, including tools and rocks. Have leaves, snow, and ice cleared regularly. Use sand or salt on walkways during winter months. In the garage, clean up any spills right  away, including grease or oil spills. What other actions can I take? Wear closed-toe shoes that fit well and support your feet. Wear shoes that have rubber soles or low heels. Use mobility aids as needed, such as canes, walkers, scooters, and crutches. Review your medicines with your health care provider. Some medicines can cause dizziness or changes in blood pressure, which increase your risk of falling. Talk with your health care provider about other ways that you can decrease your risk of falls. This may include working with a physical therapist or trainer to improve your strength, balance, and endurance. Where to find more information Centers for Disease Control and Prevention, STEADI: http://www.wolf.info/ National Institute on Aging: http://kim-miller.com/ Contact a health care provider if: You are afraid of falling at home. You feel weak, drowsy, or dizzy at home. You fall at home. Summary There are many simple things that you can do to make your home safe and to help prevent falls. Ways to make your home safe include removing tripping hazards and installing grab bars in the bathroom. Ask for help when making these changes in your home. This information is not intended to replace advice given to you by your health care provider. Make sure you discuss any questions you have with your health care provider. Document Revised: 03/05/2020 Document Reviewed: 03/05/2020 Elsevier Patient Education  2022 [place],   Merri Ray, MD [place], [facility] 09/09/21 10:31 AM

## 2021-11-05 ENCOUNTER — Encounter (HOSPITAL_BASED_OUTPATIENT_CLINIC_OR_DEPARTMENT_OTHER): Payer: Self-pay

## 2021-11-09 DIAGNOSIS — I951 Orthostatic hypotension: Secondary | ICD-10-CM | POA: Insufficient documentation

## 2021-11-09 DIAGNOSIS — H903 Sensorineural hearing loss, bilateral: Secondary | ICD-10-CM | POA: Insufficient documentation

## 2021-11-09 DIAGNOSIS — R2689 Other abnormalities of gait and mobility: Secondary | ICD-10-CM | POA: Diagnosis not present

## 2021-11-12 DIAGNOSIS — H353131 Nonexudative age-related macular degeneration, bilateral, early dry stage: Secondary | ICD-10-CM | POA: Diagnosis not present

## 2021-12-09 ENCOUNTER — Ambulatory Visit (INDEPENDENT_AMBULATORY_CARE_PROVIDER_SITE_OTHER): Payer: Medicare HMO

## 2021-12-09 DIAGNOSIS — Z0000 Encounter for general adult medical examination without abnormal findings: Secondary | ICD-10-CM

## 2021-12-09 NOTE — Progress Notes (Signed)
? ?Subjective:  ? Jason [name] is a 86 y.o. male who presents for an subsequent  Medicare Annual Wellness Visit. ? ?I connected with Andric Kerce today by telephone and verified that I am speaking with the correct person using two identifiers. ?Location patient: home ?Location provider: work ?Persons participating in the virtual visit: patient, provider. ?  ?I discussed the limitations, risks, security and privacy concerns of performing an evaluation and management service by telephone and the availability of in person appointments. I also discussed with the patient that there may be a patient responsible charge related to this service. The patient expressed understanding and verbally consented to this telephonic visit.  ?  ?Interactive audio and video telecommunications were attempted between this provider and patient, however failed, due to patient having technical difficulties OR patient did not have access to video capability.  We continued and completed visit with audio only. ? ?  ?Review of Systems    ? ?Cardiac Risk Factors include: advanced age (>65mn, >>30women);diabetes mellitus;dyslipidemia;male gender;hypertension ? ?   ?Objective:  ?  ?Today's Vitals  ? ?There is no height or weight on file to calculate BMI. ? ? ?  12/09/2021  ?  1:12 PM  ?Advanced Directives  ?Does Patient Have a Medical Advance Directive? Yes  ?Type of [place] will  ?[place] of [place]? No - copy requested  ? ? ?Current Medications (verified) ?Outpatient Encounter Medications as of 12/09/2021  ?Medication Sig  ? allopurinol (ZYLOPRIM) 300 MG tablet Take 1 tablet (300 mg total) by mouth daily.  ? Ascorbic Acid (VITAMIN C) 100 MG tablet Take 100 mg by mouth daily.  ? aspirin 81 MG chewable tablet Chew by mouth daily.  ? atorvastatin (LIPITOR) 40 MG tablet Take 1 tablet (40 mg total) by mouth daily.  ? Cholecalciferol (VITAMIN D3) 1.25 MG (50000 UT) CAPS Take by mouth.  ?  Cobalamin Combinations (B-12) [phone] MCG SUBL Take 1 tablet by mouth daily.  ? glimepiride (AMARYL) 4 MG tablet Take 1 tablet (4 mg total) by mouth in the morning and at bedtime.  ? losartan (COZAAR) 100 MG tablet Take 1 tablet (100 mg total) by mouth daily.  ? meclizine (ANTIVERT) 12.5 MG tablet Take 1 tablet (12.5 mg total) by mouth in the morning, at noon, and at bedtime.  ? metFORMIN (GLUCOPHAGE-XR) 500 MG 24 hr tablet Take 2 tablets (1,000 mg total) by mouth in the morning and at bedtime.  ? metoprolol tartrate (LOPRESSOR) 25 MG tablet Take 1 tablet (25 mg total) by mouth 2 (two) times daily. (Patient taking differently: Take 50 mg by mouth 2 (two) times daily.)  ? omeprazole (PRILOSEC) 20 MG capsule Take 1 capsule (20 mg total) by mouth daily.  ? pioglitazone (ACTOS) 15 MG tablet Take 1 tablet (15 mg total) by mouth daily.  ? ?No facility-administered encounter medications on file as of 12/09/2021.  ? ? ?Allergies (verified) ?Patient has no known allergies.  ? ?History: ?Past Medical History:  ?Diagnosis Date  ? Cancer ([facility]   ? Cataract   ? Diabetes mellitus without complication ([place]   ? Hypertension   ? Sleep apnea   ? ?History reviewed. No pertinent surgical history. ?Family History  ?Problem Relation Age of Onset  ? Cancer Father   ? Cancer Sister   ? ?Social History  ? ?Socioeconomic History  ? Marital status: Married  ?  Spouse name: Not on file  ? Number of children: Not  on file  ? Years of education: Not on file  ? Highest education level: Not on file  ?Occupational History  ? Not on file  ?Tobacco Use  ? Smoking status: Former  ?  Types: Pipe  ? Smokeless tobacco: Never  ?Substance and Sexual Activity  ? Alcohol use: Yes  ?  Alcohol/week: 2.0 standard drinks  ?  Types: 2 Glasses of wine per week  ? Drug use: Never  ? Sexual activity: Never  ?Other Topics Concern  ? Not on file  ?Social History Narrative  ? Not on file  ? ?Social Determinants of Health  ? ?Financial Resource Strain: Low Risk   ?  Difficulty of Paying Living Expenses: Not hard at all  ?Food Insecurity: No Food Insecurity  ? Worried About Charity fundraiser in the Last Year: Never true  ? Ran Out of Food in the Last Year: Never true  ?Transportation Needs: No Transportation Needs  ? Lack of Transportation (Medical): No  ? Lack of Transportation (Non-Medical): No  ?Physical Activity: Insufficiently Active  ? Days of Exercise per Week: 2 days  ? Minutes of Exercise per Session: 30 min  ?Stress: No Stress Concern Present  ? Feeling of Stress : Not at all  ?Social Connections: Moderately Integrated  ? Frequency of Communication with Friends and Family: Three times a week  ? Frequency of Social Gatherings with Friends and Family: Three times a week  ? Attends Religious Services: Never  ? Active Member of Clubs or Organizations: Yes  ? Attends Archivist Meetings: More than 4 times per year  ? Marital Status: Married  ? ? ?Tobacco Counseling ?Counseling given: Not Answered ? ? ?Clinical Intake: ? ?Pre-visit preparation completed: Yes ? ?Pain : No/denies pain ? ?  ? ?Nutritional Risks: None ?Diabetes: Yes ?CBG done?: No ?Did pt. bring in CBG monitor from home?: No ? ?How often do you need to have someone help you when you read instructions, pamphlets, or other written materials from your doctor or pharmacy?: 1 - Never ?What is the last grade level you completed in school?: PHD ? ?Diabetic?yes ?Nutrition Risk Assessment: ? ?Has the patient had any N/V/D within the last 2 months?  No  ?Does the patient have any non-healing wounds?  No  ?Has the patient had any unintentional weight loss or weight gain?  No  ? ?Diabetes: ? ?Is the patient diabetic?  Yes  ?If diabetic, was a CBG obtained today?  No  ?Did the patient bring in their glucometer from home?  No  ?How often do you monitor your CBG's? Daily .  ? ?Financial Strains and Diabetes Management: ? ?Are you having any financial strains with the device, your supplies or your medication? No .   ?Does the patient want to be seen by Chronic Care Management for management of their diabetes?  No  ?Would the patient like to be referred to a Nutritionist or for Diabetic Management?  No  ? ?Diabetic Exams: ? ?Diabetic Eye Exam: Completed 10/2021 ?Diabetic Foot Exam: Overdue, Pt has been advised about the importance in completing this exam. Pt is scheduled for diabetic foot exam on next office visit . ? ? ?Interpreter Needed?: No ? ?Information entered by :: [email or web address] ? ? ?Activities of Daily Living ? ?  12/09/2021  ?  1:13 PM  ?In your present state of health, do you have any difficulty performing the following activities:  ?Hearing? 0  ?Vision? 0  ?Difficulty concentrating or making decisions?  0  ?Walking or climbing stairs? 0  ?Dressing or bathing? 0  ?Doing errands, shopping? 0  ?Preparing Food and eating ? N  ?Using the Toilet? N  ?In the past six months, have you accidently leaked urine? N  ?Do you have problems with loss of bowel control? N  ?Managing your Medications? N  ?Managing your Finances? N  ?Housekeeping or managing your Housekeeping? N  ? ? ?Patient Care Team: ?[name], MD as PCP - General (Family Medicine) ? ?Indicate any recent Medical Services you may have received from other than [facility] providers in the past year (date may be approximate). ? ?   ?Assessment:  ? This is a routine wellness examination for Jason [name]. ? ?Hearing/Vision screen ?Vision Screening - Comments:: Annual eye exams wear glasses  ? ?Dietary issues and exercise activities discussed: ?Current Exercise Habits: The patient does not participate in regular exercise at present, Type of exercise: walking, Time (Minutes): 30, Frequency (Times/Week): 3, Weekly Exercise (Minutes/Week): 90, Intensity: Mild, Exercise limited by: orthopedic condition(s) ? ? Goals Addressed   ?None ?  ? ?Depression Screen ? ?  12/09/2021  ?  1:13 PM 12/09/2021  ?  1:11 PM  ?PHQ 2/9 Scores  ?PHQ - 2 Score 0 0  ?  ?Fall Risk ? ?  12/09/2021  ?  1:14 PM  12/09/2021  ?  1:13 PM  ?Fall Risk   ?Falls in the past year? 0 0  ?Number falls in past yr: 0 0  ?Injury with Fall? 0 0  ?Follow up Falls evaluation completed Falls evaluation completed  ? ? ?[place]

## 2021-12-09 NOTE — Patient Instructions (Signed)
[name] , ?Thank you for taking time to come for your Medicare Wellness Visit. I appreciate your ongoing commitment to your health goals. Please review the following plan we discussed and let me know if I can assist you in the future.  ? ?Screening recommendations/referrals: ?Colonoscopy: no longer required  ?Recommended yearly ophthalmology/optometry visit for glaucoma screening and checkup ?Recommended yearly dental visit for hygiene and checkup ? ?Vaccinations: ?Influenza vaccine: completed  ?Pneumococcal vaccine: completed  ?Tdap vaccine: due  ?Shingles vaccine: completed [facility]    ? ?Advanced directives: yes  ? ?Conditions/risks identified: none  ? ?Next appointment: none  ? ?Preventive Care 35 Years and Older, Male ?Preventive care refers to lifestyle choices and visits with your health care provider that can promote health and wellness. ?What does preventive care include? ?A yearly physical exam. This is also called an annual well check. ?Dental exams once or twice a year. ?Routine eye exams. Ask your health care provider how often you should have your eyes checked. ?Personal lifestyle choices, including: ?Daily care of your teeth and gums. ?Regular physical activity. ?Eating a healthy diet. ?Avoiding tobacco and drug use. ?Limiting alcohol use. ?Practicing safe sex. ?Taking low doses of aspirin every day. ?Taking vitamin and mineral supplements as recommended by your health care provider. ?What happens during an annual well check? ?The services and screenings done by your health care provider during your annual well check will depend on your age, overall health, lifestyle risk factors, and family history of disease. ?Counseling  ?Your health care provider may ask you questions about your: ?Alcohol use. ?Tobacco use. ?Drug use. ?Emotional well-being. ?Home and relationship well-being. ?Sexual activity. ?Eating habits. ?History of falls. ?Memory and ability to understand (cognition). ?Work and work  Statistician. ?Screening  ?You may have the following tests or measurements: ?Height, weight, and BMI. ?Blood pressure. ?Lipid and cholesterol levels. These may be checked every 5 years, or more frequently if you are over 33 years old. ?Skin check. ?Lung cancer screening. You may have this screening every year starting at age 50 if you have a 30-pack-year history of smoking and currently smoke or have quit within the past 15 years. ?Fecal occult blood test (FOBT) of the stool. You may have this test every year starting at age 19. ?Flexible sigmoidoscopy or colonoscopy. You may have a sigmoidoscopy every 5 years or a colonoscopy every 10 years starting at age 68. ?Prostate cancer screening. Recommendations will vary depending on your family history and other risks. ?Hepatitis C blood test. ?Hepatitis B blood test. ?Sexually transmitted disease (STD) testing. ?Diabetes screening. This is done by checking your blood sugar (glucose) after you have not eaten for a while (fasting). You may have this done every 1-3 years. ?Abdominal aortic aneurysm (AAA) screening. You may need this if you are a current or former smoker. ?Osteoporosis. You may be screened starting at age 66 if you are at high risk. ?Talk with your health care provider about your test results, treatment options, and if necessary, the need for more tests. ?Vaccines  ?Your health care provider may recommend certain vaccines, such as: ?Influenza vaccine. This is recommended every year. ?Tetanus, diphtheria, and acellular pertussis (Tdap, Td) vaccine. You may need a Td booster every 10 years. ?Zoster vaccine. You may need this after age 84. ?Pneumococcal 13-valent conjugate (PCV13) vaccine. One dose is recommended after age 23. ?Pneumococcal polysaccharide (PPSV23) vaccine. One dose is recommended after age 33. ?Talk to your health care provider about which screenings and vaccines you need  and how often you need them. ?This information is not intended to replace  advice given to you by your health care provider. Make sure you discuss any questions you have with your health care provider. ?Document Released: 08/29/2015 Document Revised: 04/21/2016 Document Reviewed: 06/03/2015 ?Elsevier Interactive Patient Education ? 2017 [place]. ? ?Fall Prevention in the Home ?Falls can cause injuries. They can happen to people of all ages. There are many things you can do to make your home safe and to help prevent falls. ?What can I do on the outside of my home? ?Regularly fix the edges of walkways and driveways and fix any cracks. ?Remove anything that might make you trip as you walk through a door, such as a raised step or threshold. ?Trim any bushes or trees on the path to your home. ?Use bright outdoor lighting. ?Clear any walking paths of anything that might make someone trip, such as rocks or tools. ?Regularly check to see if handrails are loose or broken. Make sure that both sides of any steps have handrails. ?Any raised decks and porches should have guardrails on the edges. ?Have any leaves, snow, or ice cleared regularly. ?Use sand or salt on walking paths during winter. ?Clean up any spills in your garage right away. This includes oil or grease spills. ?What can I do in the bathroom? ?Use night lights. ?Install grab bars by the toilet and in the tub and shower. Do not use towel bars as grab bars. ?Use non-skid mats or decals in the tub or shower. ?If you need to sit down in the shower, use a plastic, non-slip stool. ?Keep the floor dry. Clean up any water that spills on the floor as soon as it happens. ?Remove soap buildup in the tub or shower regularly. ?Attach bath mats securely with double-sided non-slip rug tape. ?Do not have throw rugs and other things on the floor that can make you trip. ?What can I do in the bedroom? ?Use night lights. ?Make sure that you have a light by your bed that is easy to reach. ?Do not use any sheets or blankets that are too big for your bed.  They should not hang down onto the floor. ?Have a firm chair that has side arms. You can use this for support while you get dressed. ?Do not have throw rugs and other things on the floor that can make you trip. ?What can I do in the kitchen? ?Clean up any spills right away. ?Avoid walking on wet floors. ?Keep items that you use a lot in easy-to-reach places. ?If you need to reach something above you, use a strong step stool that has a grab bar. ?Keep electrical cords out of the way. ?Do not use floor polish or wax that makes floors slippery. If you must use wax, use non-skid floor wax. ?Do not have throw rugs and other things on the floor that can make you trip. ?What can I do with my stairs? ?Do not leave any items on the stairs. ?Make sure that there are handrails on both sides of the stairs and use them. Fix handrails that are broken or loose. Make sure that handrails are as long as the stairways. ?Check any carpeting to make sure that it is firmly attached to the stairs. Fix any carpet that is loose or worn. ?Avoid having throw rugs at the top or bottom of the stairs. If you do have throw rugs, attach them to the floor with carpet tape. ?Make sure that you  have a light switch at the top of the stairs and the bottom of the stairs. If you do not have them, ask someone to add them for you. ?What else can I do to help prevent falls? ?Wear shoes that: ?Do not have high heels. ?Have rubber bottoms. ?Are comfortable and fit you well. ?Are closed at the toe. Do not wear sandals. ?If you use a stepladder: ?Make sure that it is fully opened. Do not climb a closed stepladder. ?Make sure that both sides of the stepladder are locked into place. ?Ask someone to hold it for you, if possible. ?Clearly mark and make sure that you can see: ?Any grab bars or handrails. ?First and last steps. ?Where the edge of each step is. ?Use tools that help you move around (mobility aids) if they are needed. These  include: ?Canes. ?Walkers. ?Scooters. ?Crutches. ?Turn on the lights when you go into a dark area. Replace any light bulbs as soon as they burn out. ?Set up your furniture so you have a clear path. Avoid moving your furniture around. ?If an

## 2021-12-10 ENCOUNTER — Ambulatory Visit (INDEPENDENT_AMBULATORY_CARE_PROVIDER_SITE_OTHER): Payer: Medicare HMO | Admitting: Family Medicine

## 2021-12-10 ENCOUNTER — Ambulatory Visit (INDEPENDENT_AMBULATORY_CARE_PROVIDER_SITE_OTHER)
Admission: RE | Admit: 2021-12-10 | Discharge: 2021-12-10 | Disposition: A | Discharge status: Home / Self Care | Payer: Medicare HMO | Source: Ambulatory Visit | Attending: Family Medicine | Admitting: Family Medicine

## 2021-12-10 ENCOUNTER — Encounter: Payer: Self-pay | Admitting: Family Medicine

## 2021-12-10 VITALS — BP 136/76 | HR 70 | Temp 97.6°F | Resp 18 | Ht 73.0 in | Wt 276.2 lb

## 2021-12-10 DIAGNOSIS — R0981 Nasal congestion: Secondary | ICD-10-CM | POA: Diagnosis not present

## 2021-12-10 DIAGNOSIS — R739 Hyperglycemia, unspecified: Secondary | ICD-10-CM | POA: Diagnosis not present

## 2021-12-10 DIAGNOSIS — R931 Abnormal findings on diagnostic imaging of heart and coronary circulation: Secondary | ICD-10-CM | POA: Diagnosis not present

## 2021-12-10 DIAGNOSIS — R0600 Dyspnea, unspecified: Secondary | ICD-10-CM | POA: Diagnosis not present

## 2021-12-10 DIAGNOSIS — J849 Interstitial pulmonary disease, unspecified: Secondary | ICD-10-CM

## 2021-12-10 DIAGNOSIS — E669 Obesity, unspecified: Secondary | ICD-10-CM

## 2021-12-10 DIAGNOSIS — R062 Wheezing: Secondary | ICD-10-CM

## 2021-12-10 DIAGNOSIS — E1169 Type 2 diabetes mellitus with other specified complication: Secondary | ICD-10-CM | POA: Diagnosis not present

## 2021-12-10 LAB — CBC
HCT: 42.4 % (ref 39.0–52.0)
Hemoglobin: 14.1 g/dL (ref 13.0–17.0)
MCHC: 33.2 g/dL (ref 30.0–36.0)
MCV: 91.3 fl (ref 78.0–100.0)
Platelets: 212 10*3/uL (ref 150.0–400.0)
RBC: 4.64 Mil/uL (ref 4.22–5.81)
RDW: 15.6 % — ABNORMAL HIGH (ref 11.5–15.5)
WBC: 9.5 10*3/uL (ref 4.0–10.5)

## 2021-12-10 LAB — GLUCOSE, POCT (MANUAL RESULT ENTRY): POC Glucose: 282 mg/dl — AB (ref 70–99)

## 2021-12-10 MED ORDER — AZITHROMYCIN 250 MG PO TABS: ORAL_TABLET | ORAL | 0 refills | Status: AC

## 2021-12-10 MED ORDER — AMOXICILLIN-POT CLAVULANATE 875-125 MG PO TABS: 1.0000 | ORAL_TABLET | Freq: Two times a day (BID) | ORAL | 0 refills | Status: DC

## 2021-12-10 NOTE — Progress Notes (Signed)
? ?Subjective:  ?Patient ID: Jason [name], male    DOB: 10-Aug-1936  Age: 86 y.o. MRN: [id number] ? ?CC:  ?Chief Complaint  ?Patient presents with  ? Follow-up  ?  Patient states he is here for 3 month follow up on medication and lab work.  ? ? ?HPI ?Jason [name] presents for  ? ?Wheezing: ?Past few weeks. Some dyspnea with activity, sometimes at rest. No chest pains. No leg swelling. No hx of asthma, no allergies or meds for allergies in past, but sneezing and runny nose daily past few months. No treatments. No hx of CHF. Notices wheeze with lying down. No PND. Using CPAP - working well.  ?No orthopnea, 1-2 pillows ok.  ?Not wheezing today.  ?No fever.  ?Some phlegm in am coughed up.  ?Seen by cardiology at [facility], recent cardiology eval with CT of aorta. - note from cardiologist reviewed from 11/18/2021.  Coronary calcium score 340.  Advised that the aortic aneurysm only needs periodic follow-up, no intervention.  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  FFR was low likelihood of lesion specific ischemia.  Plan for aggressive risk modification with control of cholesterol and continued statin. Metoprolol dose increased to '50mg'$  earlier this month. Dr. Hillard Danker. Did not mention wheeze to cardiology.  ?Some persitent fatigue - walking for exercise.  ?Saw ent - thought to have orthostatic hypotension, vertigo - referred to therapy. Less dizzy on higher dose metoprolol.  ? ? ?BP Readings from Last 3 Encounters:  ?12/10/21 136/76  ?09/09/21 138/82  ? ?Diabetes: ?With obesity, hyperglycemia.  A1c slightly elevated in January, he was on metformin, Amaryl, Actos at that time.  He is on statin with Lipitor, ARB with losartan. ?Glucose 361 at last office visit, A1c up to 7.5. ?Microalbumin: Elevated at 42 on 09/09/2021, normal ratio. ?Optho, foot exam, pneumovax: Up-to-date.  ?Home readings 158 this am. No 200's, no lows.  ?Stopped taking metformin a week ago due to concerns  about what he read.  ? ? ? ?Lab Results  ?Component Value Date  ? HGBA1C 7.5 (H) 09/09/2021  ? ?Lab Results  ?Component Value Date  ? MICROALBUR 42.4 (H) 09/09/2021  ? [place] 72 09/09/2021  ? CREATININE 1.40 09/09/2021  ? ? ?History ?There are no problems to display for this patient. ? ?Past Medical History:  ?Diagnosis Date  ? Cancer [facility])   ? Cataract   ? Diabetes mellitus without complication ([place])   ? Hypertension   ? Sleep apnea   ? ?No past surgical history on file. ?No Known Allergies ?Prior to Admission medications   ?Medication Sig Start Date End Date Taking? Authorizing Provider  ?allopurinol (ZYLOPRIM) 300 MG tablet Take 1 tablet (300 mg total) by mouth daily. 09/09/21  Yes [name], MD  ?Ascorbic Acid (VITAMIN C) 100 MG tablet Take 100 mg by mouth daily.   Yes [provider]  ?aspirin 81 MG chewable tablet Chew by mouth daily.   Yes [provider]  ?atorvastatin (LIPITOR) 40 MG tablet Take 1 tablet (40 mg total) by mouth daily. 09/09/21  Yes [name], MD  ?Cholecalciferol (VITAMIN D3) 1.25 MG (50000 UT) CAPS Take by mouth.   Yes [provider]  ?Cobalamin Combinations (B-12) [phone] MCG SUBL Take 1 tablet by mouth daily.   Yes [provider]  ?glimepiride (AMARYL) 4 MG tablet Take 1 tablet (4 mg total) by mouth in the morning and at bedtime. 09/09/21  Yes [name], MD  ?losartan (COZAAR) 100 MG tablet Take 1 tablet (100 mg total) by mouth daily. 09/09/21  Yes [name], MD  ?meclizine (ANTIVERT) 12.5 MG tablet Take 1 tablet (12.5 mg total) by mouth in the morning, at noon, and at bedtime. 09/09/21  Yes [name], MD  ?metFORMIN (GLUCOPHAGE-XR) 500 MG 24 hr tablet Take 2 tablets (1,000 mg total) by mouth in the morning and at bedtime. 09/09/21  Yes [name], MD  ?metoprolol tartrate (LOPRESSOR) 25 MG tablet Take 1 tablet (25 mg total) by mouth 2 (two) times daily. ?Patient taking differently: Take 50 mg by mouth 2  (two) times daily. 09/09/21  Yes [name], MD  ?omeprazole (PRILOSEC) 20 MG capsule Take 1 capsule (20 mg total) by mouth daily. 09/09/21  Yes [name], MD  ?pioglitazone (ACTOS) 15 MG tablet Take 1 tablet (15 mg total) by mouth daily. 09/09/21  Yes [name], MD  ? ?Social History  ? ?Socioeconomic History  ? Marital status: Married  ?  Spouse name: Not on file  ? Number of children: Not on file  ? Years of education: Not on file  ? Highest education level: Not on file  ?Occupational History  ? Not on file  ?Tobacco Use  ? Smoking status: Former  ?  Types: Pipe  ? Smokeless tobacco: Never  ?Substance and Sexual Activity  ? Alcohol use: Yes  ?  Alcohol/week: 2.0 standard drinks  ?  Types: 2 Glasses of wine per week  ? Drug use: Never  ? Sexual activity: Never  ?Other Topics Concern  ? Not on file  ?Social History Narrative  ? Not on file  ? ?Social Determinants of Health  ? ?Financial Resource Strain: Low Risk   ? Difficulty of Paying Living Expenses: Not hard at all  ?Food Insecurity: No Food Insecurity  ? Worried About Charity fundraiser in the Last Year: Never true  ? Ran Out of Food in the Last Year: Never true  ?Transportation Needs: No Transportation Needs  ? Lack of Transportation (Medical): No  ? Lack of Transportation (Non-Medical): No  ?Physical Activity: Insufficiently Active  ? Days of Exercise per Week: 2 days  ? Minutes of Exercise per Session: 30 min  ?Stress: No Stress Concern Present  ? Feeling of Stress : Not at all  ?Social Connections: Moderately Integrated  ? Frequency of Communication with Friends and Family: Three times a week  ? Frequency of Social Gatherings with Friends and Family: Three times a week  ? Attends Religious Services: Never  ? Active Member of Clubs or Organizations: Yes  ? Attends Archivist Meetings: More than 4 times per year  ? Marital Status: Married  ?Intimate Partner Violence: Not At Risk  ? Fear of Current or Ex-Partner: No  ?  Emotionally Abused: No  ? Physically Abused: No  ? Sexually Abused: No  ? ? ?Review of Systems ?Per HPI.  ? ?Objective:  ? ?Vitals:  ? 12/10/21 1057  ?BP: 136/76  ?Pulse: 70  ?Resp: 18  ?Temp: 97.6 ?F (36.4 ?C)  ?TempSrc: Temporal  ?SpO2: 97%  ?Weight: 276 lb 3.2 oz (125.3 kg)  ?Height: '6\' 1"'$  (1.854 m)  ? ? ? ?Physical Exam ?Vitals reviewed.  ?Constitutional:   ?   General: He is not in acute distress. ?   Appearance: He is well-developed. He is not ill-appearing or diaphoretic.  ?HENT:  ?   Head:  Normocephalic and atraumatic.  ?Neck:  ?   Vascular: No carotid bruit or JVD.  ?Cardiovascular:  ?   Rate and Rhythm: Normal rate and regular rhythm.  ?   Heart sounds: Normal heart sounds. No murmur heard. ?Pulmonary:  ?   Effort: Pulmonary effort is normal. No respiratory distress.  ?   Breath sounds: Normal breath sounds. No wheezing, rhonchi or rales.  ?Musculoskeletal:  ?   Right lower leg: No edema.  ?   Left lower leg: No edema.  ?Skin: ?   General: Skin is warm and dry.  ?Neurological:  ?   Mental Status: He is alert and oriented to person, place, and time.  ?Psychiatric:     ?   Mood and Affect: Mood normal.  ? ? ?Results for orders placed or performed in visit on 12/10/21  ?CBC  ?Result Value Ref Range  ? WBC 9.5 4.0 - 10.5 K/uL  ? RBC 4.64 4.22 - 5.81 Mil/uL  ? Platelets 212.0 Repeated and verified X2. 150.0 - 400.0 K/uL  ? Hemoglobin 14.1 13.0 - 17.0 g/dL  ? HCT 42.4 39.0 - 52.0 %  ? MCV 91.3 78.0 - 100.0 fl  ? MCHC 33.2 30.0 - 36.0 g/dL  ? RDW 15.6 (H) 11.5 - 15.5 %  ?POCT glucose (manual entry)  ?Result Value Ref Range  ? POC Glucose 282 (A) 70 - 99 mg/dl  ? ? ?DG Chest 2 View ? ?Result Date: 12/10/2021 ?CLINICAL DATA:  Wheezing, dyspnea EXAM: CHEST - 2 VIEW COMPARISON:  None. FINDINGS: Transverse diameter of heart is within normal limits. Thoracic aorta is tortuous. There are no signs of alveolar pulmonary edema. Small patchy infiltrates are seen in the left parahilar region and left lower lung fields. There  is no significant pleural effusion or pneumothorax. There is possible small calcific density in the right upper lung fields suggesting healed granuloma. Postsurgical changes noted in the head of the right humerus. IM

## 2021-12-10 NOTE — Patient Instructions (Addendum)
Okay to try over-the-counter Flonase for now in case some of your symptoms may be related to allergies.  Please have chest x-ray done today or tomorrow at the latest to make sure there is not a sign of congestive heart failure or fluid within the lungs contributing to your symptoms.  I will also check some blood work to look into this cause.Return to the clinic or go to the nearest emergency room if any of your symptoms worsen or new symptoms occur -especially if you notice more wheezing.  Lungs were clear today. ? ?Jason [name] for xray ?Walk in 8:30-4:30 during weekdays, no appointment needed ?[address]  ?[place], [place] [address] ? ?Blood sugar in the office is much higher than your home readings.  I am not sure your home meter may be working effectively.  Please bring that with you to office visit in the next week.   I do recommend restarting metformin for now.  We can discuss further changes at that follow-up visit. ? ? ?If you have lab work done today you will be contacted with your lab results within the next 2 weeks.  If you have not heard from [place] then please contact us. The fastest way to get your results is to register for My Chart. ? ? ?IF you received an x-ray today, you will receive an invoice from [facility]. Please contact [facility] at [phone] with questions or concerns regarding your invoice.  ? ?IF you received labwork today, you will receive an invoice from [facility]. Please contact [facility] at [phone] with questions or concerns regarding your invoice.  ? ?Our billing staff will not be able to assist you with questions regarding bills from these companies. ? ?You will be contacted with the lab results as soon as they are available. The fastest way to get your results is to activate your My Chart account. Instructions are located on the last page of this paperwork. If you have not heard from [place] regarding the results in 2 weeks, please contact this office. ?  ? ? ?

## 2021-12-14 ENCOUNTER — Encounter: Payer: Self-pay | Admitting: Registered Nurse

## 2021-12-14 ENCOUNTER — Other Ambulatory Visit: Payer: Self-pay

## 2021-12-14 ENCOUNTER — Ambulatory Visit (INDEPENDENT_AMBULATORY_CARE_PROVIDER_SITE_OTHER): Payer: Medicare HMO | Admitting: Registered Nurse

## 2021-12-14 VITALS — BP 138/72 | HR 62 | Temp 98.1°F | Resp 17 | Ht 73.0 in | Wt 272.6 lb

## 2021-12-14 DIAGNOSIS — R0600 Dyspnea, unspecified: Secondary | ICD-10-CM

## 2021-12-14 DIAGNOSIS — E1169 Type 2 diabetes mellitus with other specified complication: Secondary | ICD-10-CM

## 2021-12-14 DIAGNOSIS — R062 Wheezing: Secondary | ICD-10-CM | POA: Diagnosis not present

## 2021-12-14 DIAGNOSIS — E669 Obesity, unspecified: Secondary | ICD-10-CM | POA: Diagnosis not present

## 2021-12-14 NOTE — Patient Instructions (Signed)
Jason [name] -  ? ?Great to meet you ? ?Continue augmentin even if feeling better. ? ?Keep your appt with Dr. Carlota Raspberry on Thursday. ? ?Do some deep breathing throughout the day ? ?Call me if things get worse ? ?Thank you ? ?Rich  ?

## 2021-12-14 NOTE — Progress Notes (Signed)
? ?Established Patient Office Visit ? ?Subjective:  ?Patient ID: Jason [name], male    DOB: May 28, 1936  Age: 86 y.o. MRN: [id number] ? ?CC:  ?Chief Complaint  ?Patient presents with  ? Follow-up  ?  Patient is here for a follow up because he has pneumonia.  ? ? ?HPI ?Jason [name] presents for follow up  ? ?Breathing and wheezing have been stable.  ?Finished z pack , still going on augmentin ?No worsening symptoms or side effects from medication ? ?Notes his sugars have been stable, low to mid 100s per his home meter. ?He will bring this back on Thursday when he sees Dr. Carlota Raspberry again. ? ?No new symptoms or concerns at this time.  ? ?Outpatient Medications Prior to Visit  ?Medication Sig Dispense Refill  ? allopurinol (ZYLOPRIM) 300 MG tablet Take 1 tablet (300 mg total) by mouth daily. 90 tablet 1  ? amoxicillin-clavulanate (AUGMENTIN) 875-125 MG tablet Take 1 tablet by mouth 2 (two) times daily. 20 tablet 0  ? Ascorbic Acid (VITAMIN C) 100 MG tablet Take 100 mg by mouth daily.    ? aspirin 81 MG chewable tablet Chew by mouth daily.    ? atorvastatin (LIPITOR) 40 MG tablet Take 1 tablet (40 mg total) by mouth daily. 90 tablet 1  ? azithromycin (ZITHROMAX) 250 MG tablet Take 2 tablets on day 1, then 1 tablet daily on days 2 through 5 6 tablet 0  ? Cholecalciferol (VITAMIN D3) 1.25 MG (50000 UT) CAPS Take by mouth.    ? Cobalamin Combinations (B-12) [phone] MCG SUBL Take 1 tablet by mouth daily.    ? glimepiride (AMARYL) 4 MG tablet Take 1 tablet (4 mg total) by mouth in the morning and at bedtime. 180 tablet 1  ? losartan (COZAAR) 100 MG tablet Take 1 tablet (100 mg total) by mouth daily. 90 tablet 1  ? meclizine (ANTIVERT) 12.5 MG tablet Take 1 tablet (12.5 mg total) by mouth in the morning, at noon, and at bedtime. 90 tablet 1  ? metFORMIN (GLUCOPHAGE-XR) 500 MG 24 hr tablet Take 2 tablets (1,000 mg total) by mouth in the morning and at bedtime. 360 tablet 1  ? metoprolol tartrate (LOPRESSOR) 25 MG tablet Take 1 tablet  (25 mg total) by mouth 2 (two) times daily. (Patient taking differently: Take 50 mg by mouth 2 (two) times daily.) 180 tablet 1  ? omeprazole (PRILOSEC) 20 MG capsule Take 1 capsule (20 mg total) by mouth daily. 90 capsule 1  ? pioglitazone (ACTOS) 15 MG tablet Take 1 tablet (15 mg total) by mouth daily. 90 tablet 1  ? ?No facility-administered medications prior to visit.  ? ? ?Review of Systems ?Per hpi  ?  ?Objective:  ?  ? ?BP 138/72   Pulse 62   Temp 98.1 ?F (36.7 ?C) (Temporal)   Resp 17   Ht '6\' 1"'$  (1.854 m)   Wt 272 lb 9.6 oz (123.7 kg)   SpO2 97%   BMI 35.97 kg/m?  ? ?Wt Readings from Last 3 Encounters:  ?12/14/21 272 lb 9.6 oz (123.7 kg)  ?12/10/21 276 lb 3.2 oz (125.3 kg)  ?09/09/21 268 lb 6.4 oz (121.7 kg)  ? ?Physical Exam ?Constitutional:   ?   General: He is not in acute distress. ?   Appearance: Normal appearance. He is normal weight. He is not ill-appearing, toxic-appearing or diaphoretic.  ?Cardiovascular:  ?   Rate and Rhythm: Normal rate and regular rhythm.  ?   Heart sounds: Normal  heart sounds. No murmur heard. ?  No friction rub. No gallop.  ?Pulmonary:  ?   Effort: Pulmonary effort is normal. No respiratory distress.  ?   Breath sounds: Normal breath sounds. No stridor. No wheezing, rhonchi or rales.  ?   Comments: CTA through all fields ?Chest:  ?   Chest wall: No tenderness.  ?Neurological:  ?   General: No focal deficit present.  ?   Mental Status: He is alert and oriented to person, place, and time. Mental status is at baseline.  ?Psychiatric:     ?   Mood and Affect: Mood normal.     ?   Behavior: Behavior normal.     ?   Thought Content: Thought content normal.     ?   Judgment: Judgment normal.  ? ? ?No results found for any visits on 12/14/21. ? ? ? ?The ASCVD Risk score (Arnett DK, et al., 2019) failed to calculate for the following reasons: ?  The 2019 ASCVD risk score is only valid for ages 12 to 60 ? ?  ?Assessment & Plan:  ? ?Problem List Items Addressed This Visit    ?None ?Visit Diagnoses   ? ? Wheezing    -  Primary  ? Dyspnea, unspecified type      ? Type 2 diabetes mellitus with obesity ([place])      ? ?  ? ? ?No orders of the defined types were placed in this encounter. ? ? ?Return if symptoms worsen or fail to improve.  ? ?PLAN ?Pt stable from previous visit per PCP note ?Continue augmentin. Deep breathing exercises advised.  ?Ok to use OTC nasal spray ?Keep follow up with PCP on Thursday. ?Patient encouraged to call clinic with any questions, comments, or concerns. ? ? ?Maximiano Coss, NP ?

## 2021-12-15 DIAGNOSIS — H04523 Eversion of bilateral lacrimal punctum: Secondary | ICD-10-CM | POA: Diagnosis not present

## 2021-12-15 DIAGNOSIS — D485 Neoplasm of uncertain behavior of skin: Secondary | ICD-10-CM | POA: Diagnosis not present

## 2021-12-15 DIAGNOSIS — H02135 Senile ectropion of left lower eyelid: Secondary | ICD-10-CM | POA: Diagnosis not present

## 2021-12-15 DIAGNOSIS — H02003 Unspecified entropion of right eye, unspecified eyelid: Secondary | ICD-10-CM | POA: Diagnosis not present

## 2021-12-15 DIAGNOSIS — H04203 Unspecified epiphora, bilateral lacrimal glands: Secondary | ICD-10-CM | POA: Diagnosis not present

## 2021-12-15 DIAGNOSIS — Z01818 Encounter for other preprocedural examination: Secondary | ICD-10-CM | POA: Diagnosis not present

## 2021-12-17 ENCOUNTER — Ambulatory Visit (INDEPENDENT_AMBULATORY_CARE_PROVIDER_SITE_OTHER): Payer: Medicare HMO | Admitting: Family Medicine

## 2021-12-17 VITALS — BP 144/80 | HR 66 | Temp 98.0°F | Resp 16 | Ht 73.0 in | Wt 273.2 lb

## 2021-12-17 DIAGNOSIS — E669 Obesity, unspecified: Secondary | ICD-10-CM | POA: Diagnosis not present

## 2021-12-17 DIAGNOSIS — R739 Hyperglycemia, unspecified: Secondary | ICD-10-CM

## 2021-12-17 DIAGNOSIS — E1169 Type 2 diabetes mellitus with other specified complication: Secondary | ICD-10-CM

## 2021-12-17 DIAGNOSIS — I10 Essential (primary) hypertension: Secondary | ICD-10-CM

## 2021-12-17 DIAGNOSIS — R5383 Other fatigue: Secondary | ICD-10-CM

## 2021-12-17 DIAGNOSIS — E785 Hyperlipidemia, unspecified: Secondary | ICD-10-CM | POA: Diagnosis not present

## 2021-12-17 DIAGNOSIS — J849 Interstitial pulmonary disease, unspecified: Secondary | ICD-10-CM

## 2021-12-17 DIAGNOSIS — R062 Wheezing: Secondary | ICD-10-CM | POA: Diagnosis not present

## 2021-12-17 LAB — POCT GLYCOSYLATED HEMOGLOBIN (HGB A1C): Hemoglobin A1C: 7.5 % — AB (ref 4.0–5.6)

## 2021-12-17 LAB — GLUCOSE, POCT (MANUAL RESULT ENTRY): POC Glucose: 178 mg/dl — AB (ref 70–99)

## 2021-12-17 NOTE — Progress Notes (Signed)
? ?Subjective:  ?Patient ID: Jason [name], male    DOB: 1935/11/30  Age: 86 y.o. MRN: [id number] ? ?CC:  ?Chief Complaint  ?Patient presents with  ? Diabetes  ?  Pt here for for recheck notes 141 BG when he woke up this am apx 7:30 pt reports has had only 1 cup black coffee this morning BG was 141 at home and 159 in office both with pt meter, office meter reading 178   ? ? ?HPI ?Jason [name] presents for  ?Diabetes: ?Follow-up from April 27.  Hyperglycemia discussed at that time.  Question of accuracy of home meter.  Restarted metformin (had been off for a month)  As above Home reading 141 this morning.  159 in office with his meter versus 178 on our testing. ?Still on actos. No symptomatic lows.  ? ?Lab Results  ?Component Value Date  ? HGBA1C 7.5 (A) 12/17/2021  ? HGBA1C 7.5 (H) 09/09/2021  ? ?Lab Results  ?Component Value Date  ? MICROALBUR 42.4 (H) 09/09/2021  ? [place] 72 09/09/2021  ? CREATININE 1.40 09/09/2021  ? ?Pneumonia ?Wheezing noted at his last visit April 27.  Chest x-ray indicated likely interstitial pneumonia -left mid and left lower lung fields.  Treated with Augmentin, azithromycin.  Follow-up with my colleague 3 days ago.  Improving at that time.  With O2 sat 97% and afebrile. ?No recent fever.  ?Less wheeze. Occasional cough. Improving. ?No se's with augmentin.  No diarrhea.  ?Some persistent fatigue. No chest pains.  ? ?Hypertension: ?No missed dose of BP meds or new side effects.  ?BP Readings from Last 3 Encounters:  ?12/17/21 (!) 144/80  ?12/14/21 138/72  ?12/10/21 136/76  ? ?Lab Results  ?Component Value Date  ? CREATININE 1.40 09/09/2021  ? ? ? ? ?History ?There are no problems to display for this patient. ? ?Past Medical History:  ?Diagnosis Date  ? Cancer [facility])   ? Cataract   ? Diabetes mellitus without complication ([place])   ? Hypertension   ? Sleep apnea   ? ?No past surgical history on file. ?No Known Allergies ?Prior to Admission medications   ?Medication Sig Start Date End Date Taking?  Authorizing Provider  ?allopurinol (ZYLOPRIM) 300 MG tablet Take 1 tablet (300 mg total) by mouth daily. 09/09/21  Yes Jason Agreste, MD  ?Ascorbic Acid (VITAMIN C) 100 MG tablet Take 100 mg by mouth daily.   Yes [provider]  ?aspirin 81 MG chewable tablet Chew by mouth daily.   Yes [provider]  ?atorvastatin (LIPITOR) 40 MG tablet Take 1 tablet (40 mg total) by mouth daily. 09/09/21  Yes Jason Agreste, MD  ?Cholecalciferol (VITAMIN D3) 1.25 MG (50000 UT) CAPS Take by mouth.   Yes [provider]  ?Cobalamin Combinations (B-12) [phone] MCG SUBL Take 1 tablet by mouth daily.   Yes [provider]  ?glimepiride (AMARYL) 4 MG tablet Take 1 tablet (4 mg total) by mouth in the morning and at bedtime. 09/09/21  Yes Jason Agreste, MD  ?losartan (COZAAR) 100 MG tablet Take 1 tablet (100 mg total) by mouth daily. 09/09/21  Yes Jason Agreste, MD  ?meclizine (ANTIVERT) 12.5 MG tablet Take 1 tablet (12.5 mg total) by mouth in the morning, at noon, and at bedtime. 09/09/21  Yes Jason Agreste, MD  ?metFORMIN (GLUCOPHAGE-XR) 500 MG 24 hr tablet Take 2 tablets (1,000 mg total) by mouth in the morning and at bedtime. 09/09/21  Yes Jason Agreste, MD  ?  metoprolol tartrate (LOPRESSOR) 25 MG tablet Take 1 tablet (25 mg total) by mouth 2 (two) times daily. ?Patient taking differently: Take 50 mg by mouth 2 (two) times daily. 09/09/21  Yes Jason Agreste, MD  ?omeprazole (PRILOSEC) 20 MG capsule Take 1 capsule (20 mg total) by mouth daily. 09/09/21  Yes Jason Agreste, MD  ?pioglitazone (ACTOS) 15 MG tablet Take 1 tablet (15 mg total) by mouth daily. 09/09/21  Yes Jason Agreste, MD  ?amoxicillin-clavulanate (AUGMENTIN) 875-125 MG tablet Take 1 tablet by mouth 2 (two) times daily. ?Patient not taking: Reported on 12/17/2021 12/10/21   Jason Agreste, MD  ? ?Social History  ? ?Socioeconomic History  ? Marital status: Married  ?  Spouse name: Not on file  ? Number of  children: Not on file  ? Years of education: Not on file  ? Highest education level: Not on file  ?Occupational History  ? Not on file  ?Tobacco Use  ? Smoking status: Former  ?  Types: Pipe  ? Smokeless tobacco: Never  ?Substance and Sexual Activity  ? Alcohol use: Yes  ?  Alcohol/week: 2.0 standard drinks  ?  Types: 2 Glasses of wine per week  ? Drug use: Never  ? Sexual activity: Never  ?Other Topics Concern  ? Not on file  ?Social History Narrative  ? Not on file  ? ?Social Determinants of Health  ? ?Financial Resource Strain: Low Risk   ? Difficulty of Paying Living Expenses: Not hard at all  ?Food Insecurity: No Food Insecurity  ? Worried About Charity fundraiser in the Last Year: Never true  ? Ran Out of Food in the Last Year: Never true  ?Transportation Needs: No Transportation Needs  ? Lack of Transportation (Medical): No  ? Lack of Transportation (Non-Medical): No  ?Physical Activity: Insufficiently Active  ? Days of Exercise per Week: 2 days  ? Minutes of Exercise per Session: 30 min  ?Stress: No Stress Concern Present  ? Feeling of Stress : Not at all  ?Social Connections: Moderately Integrated  ? Frequency of Communication with Friends and Family: Three times a week  ? Frequency of Social Gatherings with Friends and Family: Three times a week  ? Attends Religious Services: Never  ? Active Member of Clubs or Organizations: Yes  ? Attends Archivist Meetings: More than 4 times per year  ? Marital Status: Married  ?Intimate Partner Violence: Not At Risk  ? Fear of Current or Ex-Partner: No  ? Emotionally Abused: No  ? Physically Abused: No  ? Sexually Abused: No  ? ? ?Review of Systems ?Per HPI.  ? ?Objective:  ? ?Vitals:  ? 12/17/21 0909 12/17/21 0919  ?BP: (!) 148/80 (!) 144/80  ?Pulse: 66   ?Resp: 16   ?Temp: 98 ?F (36.7 ?C)   ?TempSrc: Temporal   ?SpO2: 98%   ?Weight: 273 lb 3.2 oz (123.9 kg)   ?Height: '6\' 1"'$  (1.854 m)   ?No missed doses BP meds.  ? ? ?Physical Exam ?Vitals reviewed.   ?Constitutional:   ?   Appearance: He is well-developed.  ?HENT:  ?   Head: Normocephalic and atraumatic.  ?Neck:  ?   Vascular: No carotid bruit or JVD.  ?Cardiovascular:  ?   Rate and Rhythm: Normal rate and regular rhythm.  ?   Heart sounds: Normal heart sounds. No murmur heard. ?Pulmonary:  ?   Effort: Pulmonary effort is normal. No respiratory distress.  ?  Breath sounds: Normal breath sounds. No wheezing, rhonchi or rales.  ?Musculoskeletal:  ?   Right lower leg: No edema.  ?   Left lower leg: No edema.  ?Skin: ?   General: Skin is warm and dry.  ?Neurological:  ?   Mental Status: He is alert and oriented to person, place, and time.  ?Psychiatric:     ?   Mood and Affect: Mood normal.  ? ?Results for orders placed or performed in visit on 12/17/21  ?POCT glycosylated hemoglobin (Hb A1C)  ?Result Value Ref Range  ? Hemoglobin A1C 7.5 (A) 4.0 - 5.6 %  ? HbA1c POC (<> result, manual entry)    ? HbA1c, POC (prediabetic range)    ? HbA1c, POC (controlled diabetic range)    ?POCT glucose (manual entry)  ?Result Value Ref Range  ? POC Glucose 178 (A) 70 - 99 mg/dl  ? ? ?Assessment & Plan:  ?Jason [name] is a 86 y.o. male . ?Type 2 diabetes mellitus with obesity ([place]) - Plan: POCT glycosylated hemoglobin (Hb A1C), POCT glucose (manual entry), Ambulatory referral to Cardiology ? -Overall A1c stable.  Continue metformin, Actos, glimepiride at current doses. ? ?Hyperglycemia ? -Likely related to missed metformin.  No med changes based on above. ? 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 -Improved, lungs clear on exam.  Symptomatically improving.  RTC precautions, no change in meds for now.  Continue antibiotic until completed, ? ?Hyperlipidemia, unspecified hyperlipidemia type - Plan: Ambulatory referral to Cardiology ?Fatigue, unspecified type - Plan: Ambulatory referral to Cardiology ? -Persistent fatigue with some cardiac disease risk factors.  Refer to cardiology to discuss symptoms further and decide on further  testing, ER/RTC precautions given if acute worsening. ? ?Essential hypertension ? -Slight higher reading in office.  Home monitoring recommended with RTC precautions if over 140/80.  Continue same dose of losartan, [place]

## 2021-12-17 NOTE — Patient Instructions (Addendum)
Continue antibiotic until completed. Glad to hear that cough/wheeze is better.  ? ?Please discuss fatigue with cardiology, especially if persists after lungs/cough improves.  ? ?Keep a record of your blood pressures outside of the office and if over 140/80 - please be seen.  ? ?Return to the clinic or go to the nearest emergency room if any of your symptoms worsen or new symptoms occur. ? ?

## 2021-12-18 ENCOUNTER — Encounter: Payer: Self-pay | Admitting: Family Medicine

## 2021-12-31 ENCOUNTER — Other Ambulatory Visit: Payer: Self-pay | Admitting: Family Medicine

## 2021-12-31 DIAGNOSIS — E669 Obesity, unspecified: Secondary | ICD-10-CM

## 2021-12-31 DIAGNOSIS — M109 Gout, unspecified: Secondary | ICD-10-CM

## 2022-01-13 DIAGNOSIS — H04523 Eversion of bilateral lacrimal punctum: Secondary | ICD-10-CM | POA: Diagnosis not present

## 2022-01-13 DIAGNOSIS — D485 Neoplasm of uncertain behavior of skin: Secondary | ICD-10-CM | POA: Diagnosis not present

## 2022-01-13 DIAGNOSIS — H02132 Senile ectropion of right lower eyelid: Secondary | ICD-10-CM | POA: Diagnosis not present

## 2022-01-13 DIAGNOSIS — H02003 Unspecified entropion of right eye, unspecified eyelid: Secondary | ICD-10-CM | POA: Diagnosis not present

## 2022-01-13 DIAGNOSIS — L919 Hypertrophic disorder of the skin, unspecified: Secondary | ICD-10-CM | POA: Diagnosis not present

## 2022-01-13 DIAGNOSIS — H04521 Eversion of right lacrimal punctum: Secondary | ICD-10-CM | POA: Diagnosis not present

## 2022-01-13 DIAGNOSIS — D23112 Other benign neoplasm of skin of right lower eyelid, including canthus: Secondary | ICD-10-CM | POA: Diagnosis not present

## 2022-01-15 ENCOUNTER — Encounter: Payer: Self-pay | Admitting: Family Medicine

## 2022-01-15 ENCOUNTER — Ambulatory Visit (INDEPENDENT_AMBULATORY_CARE_PROVIDER_SITE_OTHER): Payer: Medicare HMO | Admitting: Family Medicine

## 2022-01-15 VITALS — BP 138/82 | HR 60 | Temp 97.6°F | Resp 16 | Ht 73.0 in | Wt 272.6 lb

## 2022-01-15 DIAGNOSIS — E1169 Type 2 diabetes mellitus with other specified complication: Secondary | ICD-10-CM | POA: Diagnosis not present

## 2022-01-15 DIAGNOSIS — J849 Interstitial pulmonary disease, unspecified: Secondary | ICD-10-CM | POA: Diagnosis not present

## 2022-01-15 DIAGNOSIS — I10 Essential (primary) hypertension: Secondary | ICD-10-CM

## 2022-01-15 DIAGNOSIS — E669 Obesity, unspecified: Secondary | ICD-10-CM | POA: Diagnosis not present

## 2022-01-15 DIAGNOSIS — K5900 Constipation, unspecified: Secondary | ICD-10-CM

## 2022-01-15 DIAGNOSIS — E785 Hyperlipidemia, unspecified: Secondary | ICD-10-CM

## 2022-01-15 DIAGNOSIS — G4733 Obstructive sleep apnea (adult) (pediatric): Secondary | ICD-10-CM | POA: Diagnosis not present

## 2022-01-15 DIAGNOSIS — R5383 Other fatigue: Secondary | ICD-10-CM | POA: Diagnosis not present

## 2022-01-15 MED ORDER — METFORMIN HCL 500 MG PO TABS: 1000.0000 mg | ORAL_TABLET | Freq: Two times a day (BID) | ORAL | 1 refills | Status: DC

## 2022-01-15 NOTE — Progress Notes (Signed)
Subjective:  Patient ID: Jason [name], male    DOB: [birth year]  Age: 86 y.o. MRN: [id number]  CC:  Chief Complaint  Patient presents with   Hypertension    Pt presents for follow up.    Constipation    Pt states he has been constipated for 2 to 3 weeks.   Diabetes    Pt states his blood sugar this morning was 70.    HPI [name] presents for   Diabetes:  Follow-up from May 4.  Question of accuracy of home meter previously.  Metformin had been restarted prior to last visit as he had been off for 1 month.  Home reading 73 after 66 today - no new symptoms. Some persistent fatigue.  Usually in 140-150. Missed meals 2 days ago.  No symptomatic lows, not usually low.  Still some concern about metformin - received some emails about other natural products.  Taking metformin '500mg'$  - taking 4 in am.  Actos '15mg'$  qam Amaryl '4mg'$  BID.   Persistent fatigue with cardiac risk factors discussed at his last visit, referred to cardiology.  Appointment June 9 with Dr. Oval Linsey. Lab Results  Component Value Date   HGBA1C 7.5 (A) 12/17/2021   HGBA1C 7.5 (H) 09/09/2021   Lab Results  Component Value Date   MICROALBUR 42.4 (H) 09/09/2021   LDLCALC 72 09/09/2021   CREATININE 1.40 09/09/2021   Interstitial pneumonia With wheeze.  Chest x-ray April 27 with interstitial markings increased in the left mid and left lower lung fields.  Symptomatically improving at last visit, continued Augmentin to completion. Min cough. Better. No recent fever/dyspnea.   Hypertension: Higher reading in office last visit, home monitoring recommended and continue same dose of losartan, Lopressor. No recent home readings: BP 133/60 at eye eye specialist 2 days ago - had lower lid surgery. Some pains with this procedure - may be contributing to BP. Tylenol every 6 hours. No advil.  Cardiology appt in 1 week.   BP Readings from Last 3 Encounters:  01/15/22 138/82  12/17/21 (!) 144/80  12/14/21 138/72   Lab Results   Component Value Date   CREATININE 1.40 09/09/2021   Constipation Past 4 weeks, using glycerin suppository. BM every 3 days, hard stool straining. No other meds. No recent narcotic meds.  Drinking water- 4-6 glasses.  Cereal daily, no fiber.     History There are no problems to display for this patient.  Past Medical History:  Diagnosis Date   Cancer ([place])    Cataract    Diabetes mellitus without complication ([place])    Hypertension    Sleep apnea    No past surgical history on file. No Known Allergies Prior to Admission medications   Medication Sig Start Date End Date Taking? Authorizing Provider  allopurinol (ZYLOPRIM) 300 MG tablet TAKE 1 TABLET BY MOUTH EVERY DAY 12/31/21  Yes [name], MD  Ascorbic Acid (VITAMIN C) 100 MG tablet Take 100 mg by mouth daily.   Yes [provider]  aspirin 81 MG chewable tablet Chew by mouth daily.   Yes [provider]  atorvastatin (LIPITOR) 40 MG tablet Take 1 tablet (40 mg total) by mouth daily. 09/09/21  Yes [name], MD  Cholecalciferol (VITAMIN D3) 1.25 MG (50000 UT) CAPS Take by mouth.   Yes [provider]  Cobalamin Combinations (B-12) [id number] MCG SUBL Take 1 tablet by mouth daily.   Yes [provider]  glimepiride (AMARYL) 4 MG tablet TAKE 1 TABLET (  4 MG TOTAL) BY MOUTH IN THE MORNING AND AT BEDTIME 12/31/21  Yes [name], MD  losartan (COZAAR) 100 MG tablet TAKE 1 TABLET BY MOUTH EVERY DAY 12/31/21  Yes [name], MD  meclizine (ANTIVERT) 12.5 MG tablet Take 1 tablet (12.5 mg total) by mouth in the morning, at noon, and at bedtime. 09/09/21  Yes [name], MD  metFORMIN (GLUCOPHAGE-XR) 500 MG 24 hr tablet Take 2 tablets (1,000 mg total) by mouth in the morning and at bedtime. 09/09/21  Yes [name], MD  metoprolol tartrate (LOPRESSOR) 25 MG tablet Take 1 tablet (25 mg total) by mouth 2 (two) times daily. Patient taking differently: Take 50 mg by mouth  2 (two) times daily. 09/09/21  Yes [name], MD  omeprazole (PRILOSEC) 20 MG capsule Take 1 capsule (20 mg total) by mouth daily. 09/09/21  Yes [name], MD  pioglitazone (ACTOS) 15 MG tablet Take 1 tablet (15 mg total) by mouth daily. 09/09/21  Yes [name], MD  amoxicillin-clavulanate (AUGMENTIN) 875-125 MG tablet Take 1 tablet by mouth 2 (two) times daily. Patient not taking: Reported on 12/17/2021 12/10/21   [name], MD   Social History   Socioeconomic History   Marital status: Married    Spouse name: Not on file   Number of children: Not on file   Years of education: Not on file   Highest education level: Not on file  Occupational History   Not on file  Tobacco Use   Smoking status: Former    Types: Pipe   Smokeless tobacco: Never  Substance and Sexual Activity   Alcohol use: Yes    Alcohol/week: 2.0 standard drinks    Types: 2 Glasses of wine per week   Drug use: Never   Sexual activity: Never  Other Topics Concern   Not on file  Social History Narrative   Not on file   Social Determinants of Health   Financial Resource Strain: Low Risk    Difficulty of Paying Living Expenses: Not hard at all  Food Insecurity: No Food Insecurity   Worried About Charity fundraiser in the Last Year: Never true   [place] in the Last Year: Never true  Transportation Needs: No Transportation Needs   Lack of Transportation (Medical): No   Lack of Transportation (Non-Medical): No  Physical Activity: Insufficiently Active   Days of Exercise per Week: 2 days   Minutes of Exercise per Session: 30 min  Stress: No Stress Concern Present   Feeling of Stress : Not at all  Social Connections: Moderately Integrated   Frequency of Communication with Friends and Family: Three times a week   Frequency of Social Gatherings with Friends and Family: Three times a week   Attends Religious Services: Never   Active Member of Clubs or Organizations: Yes   Attends  Music therapist: More than 4 times per year   Marital Status: Married  Human resources officer Violence: Not At Risk   Fear of Current or Ex-Partner: No   Emotionally Abused: No   Physically Abused: No   Sexually Abused: No    Review of Systems  Constitutional:  Negative for fatigue and unexpected weight change.  Eyes:  Negative for visual disturbance.  Respiratory:  Negative for cough, chest tightness and shortness of breath.   Cardiovascular:  Negative for chest pain, palpitations and leg swelling.  Gastrointestinal:  Positive for constipation. Negative for abdominal  pain and blood in stool.  Neurological:  Positive for headaches (with eye procedure only.). Negative for dizziness and light-headedness.    Objective:   Vitals:   01/15/22 0943  BP: 138/82  Pulse: 60  Resp: 16  Temp: 97.6 F (36.4 C)  TempSrc: Temporal  SpO2: 97%  Weight: 272 lb 9.6 oz (123.7 kg)  Height: '6\' 1"'$  (1.854 m)     Physical Exam Vitals reviewed.  Constitutional:      Appearance: He is well-developed.  HENT:     Head: Normocephalic and atraumatic.  Eyes:     Comments: Ecchymosis  below R eye.   Neck:     Vascular: No carotid bruit or JVD.  Cardiovascular:     Rate and Rhythm: Normal rate and regular rhythm.     Heart sounds: Normal heart sounds. No murmur heard. Pulmonary:     Effort: Pulmonary effort is normal.     Breath sounds: Normal breath sounds. No rales.  Musculoskeletal:     Right lower leg: No edema.     Left lower leg: No edema.  Skin:    General: Skin is warm and dry.  Neurological:     Mental Status: He is alert and oriented to person, place, and time.  Psychiatric:        Mood and Affect: Mood normal.       Assessment & Plan:  Josie Mesa is a 86 y.o. male . Type 2 diabetes mellitus with obesity ([place]) - Plan: metFORMIN (GLUCOPHAGE) 500 MG tablet  -Borderline control but some concern with persistent fatigue.  We will try changing the metformin to twice daily  dosing instead of 2000 mg dose all in the morning.  Keep follow-up with cardiology as planned.  Watch for symptomatic lows and if it goes occur, decrease glimepiride.  RTC precautions.  Interstitial pneumonia ([place]) - Plan: DG Chest 2 View Symptomatically improved, updated chest x-ray ordered.  Fatigue, unspecified type Follow-up planned with cardiology.  Less likely diabetic cause.  Monitor readings, monitor for hypoglycemia, change in metformin dosing as above.  No other changes for now.  Essential hypertension Borderline, but pain with recent eye surgery.  Home monitoring with RTC precautions discussed, no med changes for now.  Constipation, unspecified constipation type Daily fiber, fluids discussed with MiraLAX temporarily as needed.  Handout given.  RTC precautions  Meds ordered this encounter  Medications   metFORMIN (GLUCOPHAGE) 500 MG tablet    Sig: Take 2 tablets (1,000 mg total) by mouth 2 (two) times daily with a meal.    Dispense:  180 tablet    Refill:  1   Patient Instructions  Repeat xray at [facility]: [facility] in 8:30-4:30 during weekdays, no appointment needed [place].  [place], [place] [address]  Blood pressure borderline today, but no changes. As eye pain improves, that should improve as well.   Start fiber supplement - citrucel or metamucil daily.  Miralax over the counter up to twice per day until soft stools. Can be taken daily.   Change metformin to 2 pills twice per day. If any further low readings let me know and we can decrease the glimepiride if needed.   Return to the clinic or go to the nearest emergency room if any of your symptoms worsen or new symptoms occur.  Constipation, Adult Constipation is when a person has fewer than three bowel movements in a week, has difficulty having a bowel movement, or has stools (feces) that are dry,  hard, or larger than normal. Constipation may be caused by an underlying condition. It may become worse  with age if a person takes certain medicines and does not take in enough fluids. Follow these instructions at home: Eating and drinking  Eat foods that have a lot of fiber, such as beans, whole grains, and fresh fruits and vegetables. Limit foods that are low in fiber and high in fat and processed sugars, such as fried or sweet foods. These include french fries, hamburgers, cookies, candies, and soda. Drink enough fluid to keep your urine pale yellow. General instructions Exercise regularly or as told by your health care provider. Try to do 150 minutes of moderate exercise each week. Use the bathroom when you have the urge to go. Do not hold it in. Take over-the-counter and prescription medicines only as told by your health care provider. This includes any fiber supplements. During bowel movements: Practice deep breathing while relaxing the lower abdomen. Practice pelvic floor relaxation. Watch your condition for any changes. Let your health care provider know about them. Keep all follow-up visits as told by your health care provider. This is important. Contact a health care provider if: You have pain that gets worse. You have a fever. You do not have a bowel movement after 4 days. You vomit. You are not hungry or you lose weight. You are bleeding from the opening between the buttocks (anus). You have thin, pencil-like stools. Get help right away if: You have a fever and your symptoms suddenly get worse. You leak stool or have blood in your stool. Your abdomen is bloated. You have severe pain in your abdomen. You feel dizzy or you faint. Summary Constipation is when a person has fewer than three bowel movements in a week, has difficulty having a bowel movement, or has stools (feces) that are dry, hard, or larger than normal. Eat foods that have a lot of fiber, such as beans, whole grains, and fresh fruits and vegetables. Drink enough fluid to keep your urine pale yellow. Take  over-the-counter and prescription medicines only as told by your health care provider. This includes any fiber supplements. This information is not intended to replace advice given to you by your health care provider. Make sure you discuss any questions you have with your health care provider. Document Revised: 06/20/2019 Document Reviewed: 06/20/2019 Elsevier Patient Education  [place],   Merri Ray, MD [place], [facility] 01/15/22 10:10 AM

## 2022-01-15 NOTE — Patient Instructions (Addendum)
Repeat xray at [facility]: [place] in 8:30-4:30 during weekdays, no appointment needed [place].  [place], [place] [address]  Blood pressure borderline today, but no changes. As eye pain improves, that should improve as well.   Start fiber supplement - citrucel or metamucil daily.  Miralax over the counter up to twice per day until soft stools. Can be taken daily.   Change metformin to 2 pills twice per day. If any further low readings let me know and we can decrease the glimepiride if needed.   Return to the clinic or go to the nearest emergency room if any of your symptoms worsen or new symptoms occur.  Constipation, Adult Constipation is when a person has fewer than three bowel movements in a week, has difficulty having a bowel movement, or has stools (feces) that are dry, hard, or larger than normal. Constipation may be caused by an underlying condition. It may become worse with age if a person takes certain medicines and does not take in enough fluids. Follow these instructions at home: Eating and drinking  Eat foods that have a lot of fiber, such as beans, whole grains, and fresh fruits and vegetables. Limit foods that are low in fiber and high in fat and processed sugars, such as fried or sweet foods. These include french fries, hamburgers, cookies, candies, and soda. Drink enough fluid to keep your urine pale yellow. General instructions Exercise regularly or as told by your health care provider. Try to do 150 minutes of moderate exercise each week. Use the bathroom when you have the urge to go. Do not hold it in. Take over-the-counter and prescription medicines only as told by your health care provider. This includes any fiber supplements. During bowel movements: Practice deep breathing while relaxing the lower abdomen. Practice pelvic floor relaxation. Watch your condition for any changes. Let your health care provider know about them. Keep all follow-up visits as  told by your health care provider. This is important. Contact a health care provider if: You have pain that gets worse. You have a fever. You do not have a bowel movement after 4 days. You vomit. You are not hungry or you lose weight. You are bleeding from the opening between the buttocks (anus). You have thin, pencil-like stools. Get help right away if: You have a fever and your symptoms suddenly get worse. You leak stool or have blood in your stool. Your abdomen is bloated. You have severe pain in your abdomen. You feel dizzy or you faint. Summary Constipation is when a person has fewer than three bowel movements in a week, has difficulty having a bowel movement, or has stools (feces) that are dry, hard, or larger than normal. Eat foods that have a lot of fiber, such as beans, whole grains, and fresh fruits and vegetables. Drink enough fluid to keep your urine pale yellow. Take over-the-counter and prescription medicines only as told by your health care provider. This includes any fiber supplements. This information is not intended to replace advice given to you by your health care provider. Make sure you discuss any questions you have with your health care provider. Document Revised: 06/20/2019 Document Reviewed: 06/20/2019 Elsevier Patient Education  [place].

## 2022-01-17 DIAGNOSIS — I10 Essential (primary) hypertension: Secondary | ICD-10-CM | POA: Insufficient documentation

## 2022-01-17 DIAGNOSIS — E119 Type 2 diabetes mellitus without complications: Secondary | ICD-10-CM | POA: Insufficient documentation

## 2022-01-17 DIAGNOSIS — E785 Hyperlipidemia, unspecified: Secondary | ICD-10-CM | POA: Insufficient documentation

## 2022-01-17 DIAGNOSIS — G4733 Obstructive sleep apnea (adult) (pediatric): Secondary | ICD-10-CM | POA: Insufficient documentation

## 2022-01-17 DIAGNOSIS — E1169 Type 2 diabetes mellitus with other specified complication: Secondary | ICD-10-CM | POA: Insufficient documentation

## 2022-01-18 ENCOUNTER — Ambulatory Visit (INDEPENDENT_AMBULATORY_CARE_PROVIDER_SITE_OTHER)
Admission: RE | Admit: 2022-01-18 | Discharge: 2022-01-18 | Disposition: A | Discharge status: Home / Self Care | Payer: Medicare HMO | Source: Ambulatory Visit | Attending: Family Medicine | Admitting: Family Medicine

## 2022-01-18 DIAGNOSIS — J189 Pneumonia, unspecified organism: Secondary | ICD-10-CM | POA: Diagnosis not present

## 2022-01-18 DIAGNOSIS — J849 Interstitial pulmonary disease, unspecified: Secondary | ICD-10-CM | POA: Diagnosis not present

## 2022-01-20 ENCOUNTER — Ambulatory Visit (HOSPITAL_BASED_OUTPATIENT_CLINIC_OR_DEPARTMENT_OTHER): Payer: Medicare HMO | Admitting: Cardiovascular Disease

## 2022-01-22 ENCOUNTER — Encounter (HOSPITAL_BASED_OUTPATIENT_CLINIC_OR_DEPARTMENT_OTHER): Payer: Self-pay

## 2022-01-22 ENCOUNTER — Encounter (HOSPITAL_BASED_OUTPATIENT_CLINIC_OR_DEPARTMENT_OTHER): Payer: Self-pay | Admitting: Cardiovascular Disease

## 2022-01-22 ENCOUNTER — Telehealth: Payer: Self-pay | Admitting: Family Medicine

## 2022-01-22 ENCOUNTER — Ambulatory Visit (HOSPITAL_BASED_OUTPATIENT_CLINIC_OR_DEPARTMENT_OTHER): Payer: Medicare HMO | Admitting: Cardiovascular Disease

## 2022-01-22 VITALS — BP 152/78 | HR 54 | Ht 73.0 in | Wt 271.8 lb

## 2022-01-22 DIAGNOSIS — I7121 Aneurysm of the ascending aorta, without rupture: Secondary | ICD-10-CM | POA: Diagnosis not present

## 2022-01-22 DIAGNOSIS — I712 Thoracic aortic aneurysm, without rupture, unspecified: Secondary | ICD-10-CM | POA: Insufficient documentation

## 2022-01-22 DIAGNOSIS — E785 Hyperlipidemia, unspecified: Secondary | ICD-10-CM | POA: Diagnosis not present

## 2022-01-22 DIAGNOSIS — R2681 Unsteadiness on feet: Secondary | ICD-10-CM | POA: Diagnosis not present

## 2022-01-22 DIAGNOSIS — I2510 Atherosclerotic heart disease of native coronary artery without angina pectoris: Secondary | ICD-10-CM

## 2022-01-22 DIAGNOSIS — I10 Essential (primary) hypertension: Secondary | ICD-10-CM

## 2022-01-22 HISTORY — DX: Atherosclerotic heart disease of native coronary artery without angina pectoris: I25.10

## 2022-01-22 HISTORY — DX: Aneurysm of the ascending aorta, without rupture: I71.21

## 2022-01-22 HISTORY — DX: Unsteadiness on feet: R26.81

## 2022-01-22 MED ORDER — METOPROLOL TARTRATE 25 MG PO TABS: 25.0000 mg | ORAL_TABLET | Freq: Two times a day (BID) | ORAL | 3 refills | Status: DC

## 2022-01-22 MED ORDER — AMLODIPINE BESYLATE 5 MG PO TABS: 5.0000 mg | ORAL_TABLET | Freq: Every day | ORAL | 3 refills | Status: DC

## 2022-01-22 NOTE — Progress Notes (Unsigned)
Cardiology Office Note   Date:  01/22/2022   ID:  Jason [name], DOB 02-Oct-1935, MRN [id number]  PCP:  [name], MD  Cardiologist:   Skeet Latch, MD   No chief complaint on file.   History of Present Illness: Jason [name] is a 86 y.o. male with non-obstructive CAD, mild ascending aortic aneuryms., OSA on CPAP, hypertension, hyperlipidemia, and diabetes who is being seen today for the evaluation of fatigue at the request of [name], MD. he saw Dr. Nyoka Cowden on 01/15/2022 and reported persistent fatigue.  Given his underlying risk factors he was referred for cardiology evaluation.  He also receives care at the [facility] and had a coronary CTA.  He had scattered plaque with mild stenosis in the LAD, moderate disease in the distal RCA, and mild to moderate disease in the left circumflex.  FFR CT was normal.  Coronary calcium score of 340.  His ascending aorta was 3.9 cm.  Lately he has been feeling well.  His wife notes that he gets somewhat short of breath and breathes heavily when he walks.  This is why the CT scan was performed.  He has no exertional chest pain or shortness of breath.  He doesn't get much exercise. He feels unstable on his feet and is afraid of falling.  At times he uses a cane.  He uses his CPAP faithfully.  He has been working on his diet because his wife is doing [employer] and he eats what ever she gives him.  He is unsure about his blood pressures at home.  When he checks that he does not feel that it is accurate.  He notices that lately his blood glucose has been in the 70s which is unusually low for him.  He plans to follow-up with Dr. Carlota Raspberry about this.  He gets care at the [facility].  When he last saw cardiologist there they increased metoprolol to 50 mg but he is unsure why.  He thinks that his balance has been worse since making that change.  Past Medical History:  Diagnosis Date   Ascending aortic aneurysm ([place]) 01/22/2022   CAD in  native artery 01/22/2022   Cancer [facility])    Cataract    Diabetes mellitus without complication ([place])    Gait instability 01/22/2022   Hypertension    Sleep apnea     No past surgical history on file.   Current Outpatient Medications  Medication Sig Dispense Refill   allopurinol (ZYLOPRIM) 300 MG tablet TAKE 1 TABLET BY MOUTH EVERY DAY 90 tablet 1   amLODipine (NORVASC) 5 MG tablet Take 1 tablet (5 mg total) by mouth daily. 90 tablet 3   Ascorbic Acid (VITAMIN C) 100 MG tablet Take 100 mg by mouth daily.     aspirin 81 MG chewable tablet Chew by mouth daily.     atorvastatin (LIPITOR) 40 MG tablet Take 1 tablet (40 mg total) by mouth daily. 90 tablet 1   Cholecalciferol (VITAMIN D3) 1.25 MG (50000 UT) CAPS Take by mouth.     Cobalamin Combinations (B-12) [phone] MCG SUBL Take 1 tablet by mouth daily.     glimepiride (AMARYL) 4 MG tablet TAKE 1 TABLET (4 MG TOTAL) BY MOUTH IN THE MORNING AND AT BEDTIME 180 tablet 1   losartan (COZAAR) 100 MG tablet TAKE 1 TABLET BY MOUTH EVERY DAY 90 tablet 1   meclizine (ANTIVERT) 12.5 MG tablet Take 1 tablet (12.5 mg total) by mouth  in the morning, at noon, and at bedtime. 90 tablet 1   metFORMIN (GLUCOPHAGE) 500 MG tablet Take 2 tablets (1,000 mg total) by mouth 2 (two) times daily with a meal. 180 tablet 1   omeprazole (PRILOSEC) 20 MG capsule Take 1 capsule (20 mg total) by mouth daily. 90 capsule 1   pioglitazone (ACTOS) 15 MG tablet Take 1 tablet (15 mg total) by mouth daily. 90 tablet 1   metoprolol tartrate (LOPRESSOR) 25 MG tablet Take 1 tablet (25 mg total) by mouth 2 (two) times daily. 180 tablet 3   No current facility-administered medications for this visit.    Allergies:   Lisinopril    Social History:  The patient  reports that he has quit smoking. His smoking use included pipe. He has never used smokeless tobacco. He reports current alcohol use of about 2.0 standard drinks of alcohol per week. He reports that he does not use drugs.    Family History:  The patient's family history includes Cancer in his father and sister; Heart attack in his mother.    ROS:  Please see the history of present illness.   Otherwise, review of systems are positive for none.   All other systems are reviewed and negative.    PHYSICAL EXAM: VS:  BP (!) 152/78 (BP Location: Right Arm, Patient Position: Sitting, Cuff Size: Normal)   Pulse (!) 54   Ht '6\' 1"'$  (1.854 m)   Wt 271 lb 12.8 oz (123.3 kg)   BMI 35.86 kg/m  , BMI Body mass index is 35.86 kg/m. GENERAL:  Well appearing HEENT:  Pupils equal round and reactive, fundi not visualized, oral mucosa unremarkable NECK:  No jugular venous distention, waveform within normal limits, carotid upstroke brisk and symmetric, no bruits, no thyromegaly LYMPHATICS:  No cervical adenopathy LUNGS:  Clear to auscultation bilaterally HEART:  RRR.  PMI not displaced or sustained,S1 and S2 within normal limits, no S3, no S4, no clicks, no rubs, *** murmurs ABD:  Flat, positive bowel sounds normal in frequency in pitch, no bruits, no rebound, no guarding, no midline pulsatile mass, no hepatomegaly, no splenomegaly EXT:  2 plus pulses throughout, no edema, no cyanosis no clubbing SKIN:  No rashes no nodules NEURO:  Cranial nerves II through XII grossly intact, motor grossly intact throughout PSYCH:  Cognitively intact, oriented to person place and time    EKG:  EKG is ordered today. The ekg ordered today demonstrates sinus bradycardia.  Rate 54 bpm.  First-degree AV block.  Right bundle branch block.  LAFB.  Cannot rule out prior inferior infarct.   Recent Labs: 09/09/2021: ALT 16; BUN 18; Creatinine, Ser 1.40; Potassium 4.2; Sodium 135 12/10/2021: Hemoglobin 14.1; Platelets 212.0 Repeated and verified X2.    Lipid Panel    Component Value Date/Time   CHOL 139 09/09/2021 1035   TRIG 149.0 09/09/2021 1035   HDL 37.40 (L) 09/09/2021 1035   CHOLHDL 4 09/09/2021 1035   VLDL 29.8 09/09/2021 1035    LDLCALC 72 09/09/2021 1035      Wt Readings from Last 3 Encounters:  01/22/22 271 lb 12.8 oz (123.3 kg)  01/15/22 272 lb 9.6 oz (123.7 kg)  12/17/21 273 lb 3.2 oz (123.9 kg)      ASSESSMENT AND PLAN:  Patient Active Problem List   Diagnosis Date Noted   CAD in native artery 01/22/2022   Ascending aortic aneurysm ([facility]) 01/22/2022   Gait instability 01/22/2022   Essential hypertension 01/17/2022   Type 2 diabetes  mellitus with obesity ([place]) 01/17/2022   HLD (hyperlipidemia) 01/17/2022   Obstructive sleep apnea 01/17/2022      Current medicines are reviewed at length with the patient today.  The patient does not have concerns regarding medicines.  The following changes have been made: Reduce metoprolol and add amlodipine  Labs/ tests ordered today include:   Orders Placed This Encounter  Procedures   Ambulatory referral to Physical Therapy   AMB Referral to [facility] Pharm-D   EKG 12-Lead     Disposition:   FU with Tyliyah Mcmeekin C. Oval Linsey, MD, [facility] in 1 year.  PharmD or APP in 1 month     Signed, Azel Gumina C. Oval Linsey, MD, [facility]  01/22/2022 10:37 AM    [facility]

## 2022-01-22 NOTE — Telephone Encounter (Signed)
Patient calling to inform Dr Nyoka Cowden his Blood sugar has been on the 97s.for the past 7-8 days -   No further action needed.

## 2022-01-22 NOTE — Assessment & Plan Note (Signed)
Referral to PT.  Once he completes PT he would be a good candidate for the prep program.

## 2022-01-22 NOTE — Telephone Encounter (Signed)
Pt reports last 8-10 days has had sugars in 70s noted has not had any physical sxs

## 2022-01-22 NOTE — Patient Instructions (Signed)
Medication Instructions:  DECREASE YOUR METOPROLOL TO 25 MG TWICE A DAY   START AMLODIPINE 5 MG DAILY   *If you need a refill on your cardiac medications before your next appointment, please call your pharmacy*   Lab Work: NONE If you have labs (blood work) drawn today and your tests are completely normal, you will receive your results only by: [place] (if you have MyChart) OR A paper copy in the mail If you have any lab test that is abnormal or we need to change your treatment, we will call you to review the results.   Testing/Procedures: Your physician has requested that you have an echocardiogram. Echocardiography is a painless test that uses sound waves to create images of your heart. It provides your doctor with information about the size and shape of your heart and how well your heart's chambers and valves are working. This procedure takes approximately one hour. There are no restrictions for this procedure. 1 YEAR A FEW DAYS PRIOR TO FOLLOW UP  Follow-Up: At [facility], you and your health needs are our priority.  As part of our continuing mission to provide you with exceptional heart care, we have created designated Provider Care Teams.  These Care Teams include your primary Cardiologist (physician) and Advanced Practice Providers (APPs -  Physician Assistants and Nurse Practitioners) who all work together to provide you with the care you need, when you need it.  We recommend signing up for the patient portal called "MyChart".  Sign up information is provided on this After Visit Summary.  MyChart is used to connect with patients for Virtual Visits (Telemedicine).  Patients are able to view lab/test results, encounter notes, upcoming appointments, etc.  Non-urgent messages can be sent to your provider as well.   To learn more about what you can do with MyChart, go to [email or web address].    Your next appointment:   12 month(s)  The format for your next appointment:    In Person  Provider:   Skeet Latch, MD   1 MONTH WITH PHARM D   Other Instructions MONITOR AND LOG YOUR BLOOD PRESSURE DAILY. BRING LOG AND MACHINE TO FOLLOW UP IN 1 MONTH   You have been referred to PHYSICAL THERAPY DOWNSTAIRS. IF YOU DO NOT HEAR FROM THEM IN 2 WEEKS PLEASE CALL THE OFFICE TO FOLLOW UP

## 2022-01-22 NOTE — Telephone Encounter (Signed)
Called pt back to discuss, LM to call back so that we can discuss if he has had any lightheaded dizzy spells, nausea, sweating or other symptoms when he is having these lows?

## 2022-01-22 NOTE — Assessment & Plan Note (Signed)
Nonobstructive CAD on coronary CTA performed at the [facility] 11/2021.  Continue aspirin and statin.  Reducing metoprolol as above.

## 2022-01-22 NOTE — Assessment & Plan Note (Signed)
3.9 cm on chest CT 11/2021.  We will get an echo in a year.  He is on a beta-blocker and we are working on blood pressure control as above.

## 2022-01-22 NOTE — Assessment & Plan Note (Addendum)
Blood pressure was very elevated today both initially and on repeat.  We will add amlodipine 5 mg daily.  Reduce metoprolol back to 25 mg as he is bradycardic today and I suspect it may be contributing to his dizziness.  He has both orthostatic symptoms but more significantly vertigo.  For now I would aim to get his blood pressure at least under 140/90 and then continue to aim for 130/80 if he tolerates it.  Continue losartan.  Would consider switching this to a more potent ARB if needed.  We can also consider switching metoprolol to carvedilol for improved blood pressure control and less heart rate effect.

## 2022-01-22 NOTE — Assessment & Plan Note (Signed)
Continue CPAP.  

## 2022-01-22 NOTE — Assessment & Plan Note (Signed)
Lipids are nearly controlled.  Continue atorvastatin.  His LDL needs to be less than 70.  Getting him more active and continue working on his diet.  If lipids remain above goal at follow-up would increase to 80 mg.

## 2022-01-23 ENCOUNTER — Encounter (HOSPITAL_BASED_OUTPATIENT_CLINIC_OR_DEPARTMENT_OTHER): Payer: Self-pay | Admitting: Cardiovascular Disease

## 2022-01-23 NOTE — Telephone Encounter (Signed)
Decrease amaryl to once per day for now and if any further readings below 100 - stop Amaryl. Keep me posted.

## 2022-01-25 NOTE — Telephone Encounter (Signed)
Pt has called back and was informed I requested that the pt call back with readings Friday as well to track this trend pt did report a 63 this am without sxs

## 2022-01-25 NOTE — Telephone Encounter (Signed)
Called LM to call back so we can discuss change in Amaryl directions

## 2022-01-26 ENCOUNTER — Other Ambulatory Visit (HOSPITAL_BASED_OUTPATIENT_CLINIC_OR_DEPARTMENT_OTHER): Payer: Self-pay | Admitting: *Deleted

## 2022-01-26 DIAGNOSIS — I7121 Aneurysm of the ascending aorta, without rupture: Secondary | ICD-10-CM

## 2022-01-28 ENCOUNTER — Telehealth: Payer: Self-pay | Admitting: Family Medicine

## 2022-01-28 NOTE — Telephone Encounter (Signed)
Pt called stating he woke up to pain from his ear down to his jaw line. I tried to get pt to book appt. Pt just want to talk to someone.

## 2022-01-28 NOTE — Telephone Encounter (Signed)
Called to update pt on this dosing but pt was unavailable and I could not leave a message will call again later

## 2022-01-28 NOTE — Telephone Encounter (Signed)
Called pt today to check on status as well as other phone note concern.  Pt reports BG on Monday of 63 this was the first day he took only 1 glimepiride. Tuesday was 101 Wednesday 97 Thursday 99, notes first thing in the morning prior to a meal

## 2022-01-28 NOTE — Telephone Encounter (Signed)
He will be going to the Urgent care on [address] due to no availability with the office next 2 days.

## 2022-01-28 NOTE — Telephone Encounter (Signed)
Pt reports pain from Rt ear down to jaw, notes has been consistent pain since waking this am, took ibuprofen about 1.5 hours ago. Pt wanted to know about walk in clinic. Asked about also seeing Dr Carlota Raspberry today

## 2022-01-28 NOTE — Telephone Encounter (Signed)
Great.  Thanks for the update.  Based on his readings can try decreasing to just one half of the glimepiride in the morning and follow readings on that dose.  If minimal change may be able to stop glimepiride altogether.  Keep me posted over the next 1 week.

## 2022-01-29 NOTE — Telephone Encounter (Signed)
Called pt and discussed reported this am was 110 will start half tablet dose Saturday morning 01/30/22 and I will follow up with pt next week

## 2022-02-12 ENCOUNTER — Other Ambulatory Visit: Payer: Self-pay | Admitting: Family Medicine

## 2022-02-12 DIAGNOSIS — R42 Dizziness and giddiness: Secondary | ICD-10-CM

## 2022-02-22 ENCOUNTER — Encounter: Payer: Self-pay | Admitting: Pharmacist

## 2022-02-22 ENCOUNTER — Ambulatory Visit: Payer: Medicare HMO | Admitting: Pharmacist

## 2022-02-22 VITALS — BP 130/66 | HR 68 | Wt 278.4 lb

## 2022-02-22 DIAGNOSIS — M25519 Pain in unspecified shoulder: Secondary | ICD-10-CM | POA: Insufficient documentation

## 2022-02-22 DIAGNOSIS — E7800 Pure hypercholesterolemia, unspecified: Secondary | ICD-10-CM | POA: Insufficient documentation

## 2022-02-22 DIAGNOSIS — R0602 Shortness of breath: Secondary | ICD-10-CM | POA: Insufficient documentation

## 2022-02-22 DIAGNOSIS — I10 Essential (primary) hypertension: Secondary | ICD-10-CM | POA: Diagnosis not present

## 2022-02-22 DIAGNOSIS — R5381 Other malaise: Secondary | ICD-10-CM | POA: Insufficient documentation

## 2022-02-22 DIAGNOSIS — M109 Gout, unspecified: Secondary | ICD-10-CM | POA: Insufficient documentation

## 2022-02-22 DIAGNOSIS — K219 Gastro-esophageal reflux disease without esophagitis: Secondary | ICD-10-CM | POA: Insufficient documentation

## 2022-02-22 DIAGNOSIS — E291 Testicular hypofunction: Secondary | ICD-10-CM | POA: Insufficient documentation

## 2022-02-22 DIAGNOSIS — E1169 Type 2 diabetes mellitus with other specified complication: Secondary | ICD-10-CM

## 2022-02-22 DIAGNOSIS — H353132 Nonexudative age-related macular degeneration, bilateral, intermediate dry stage: Secondary | ICD-10-CM | POA: Insufficient documentation

## 2022-02-22 DIAGNOSIS — E669 Obesity, unspecified: Secondary | ICD-10-CM

## 2022-02-22 DIAGNOSIS — H8110 Benign paroxysmal vertigo, unspecified ear: Secondary | ICD-10-CM | POA: Insufficient documentation

## 2022-02-22 NOTE — Progress Notes (Signed)
Patient ID: Jason [name]                 DOB: 05/14/1936                      MRN: [id number]     HPI: Jason [name] is a 86 y.o. male referred by Dr. Oval Linsey to HTN clinic. PMH is significant for HTN, CAD, T2DM, and HLD. At last visit with Dr Oval Linsey amlodipine was added and metoprolol was decreased due to bradycardia.  Patient presents today with wife in good spirits. Biggest complaint is he often feels tired.  Has not noticed any significant changes in his BP since adding amlodipine and decreasing metoprolol.   Uses a [employer] BP cuff he received from the [place] about a year ago.    Had patient take BP with home cuff: 163/80, pulse 73.  Checked BP with office cuff: 130/66.   Checked manually: 122/64.  Reports blood sugar has been much better controlled since PCP adjusted DM medications.  Diet is consistent.  Mornings are cereal with banana, lunch is cottage cheese or a salad, and dinner is whatever wife makes.    Current HTN meds:  Amlodipine 5 mg daily Metoprolol tartrate '25mg'$  BID Losartan '100mg'$  daily  Wt Readings from Last 3 Encounters:  01/22/22 271 lb 12.8 oz (123.3 kg)  01/15/22 272 lb 9.6 oz (123.7 kg)  12/17/21 273 lb 3.2 oz (123.9 kg)   BP Readings from Last 3 Encounters:  01/22/22 (!) 152/78  01/15/22 138/82  12/17/21 (!) 144/80   Pulse Readings from Last 3 Encounters:  01/22/22 (!) 54  01/15/22 60  12/17/21 66    Renal function: CrCl cannot be calculated (Patient's most recent lab result is older than the maximum 21 days allowed.).  Past Medical History:  Diagnosis Date   Ascending aortic aneurysm ([place]) 01/22/2022   CAD in native artery 01/22/2022   Cancer ([place])    Cataract    Diabetes mellitus without complication ([place])    Gait instability 01/22/2022   Hypertension    Sleep apnea     Current Outpatient Medications on File Prior to Visit  Medication Sig Dispense Refill   allopurinol (ZYLOPRIM) 300 MG tablet Take 1 tablet by mouth daily.     aspirin EC 81 MG  tablet Take 1 tablet every day by oral route.     atorvastatin (LIPITOR) 40 MG tablet Take 1 tablet by mouth daily.     metFORMIN (GLUCOPHAGE-XR) 500 MG 24 hr tablet Take 4 tablets by mouth daily.     amLODipine (NORVASC) 5 MG tablet Take 1 tablet (5 mg total) by mouth daily. 90 tablet 3   Ascorbic Acid (VITAMIN C) 100 MG tablet Take 100 mg by mouth daily.     atorvastatin (LIPITOR) 40 MG tablet Take 1 tablet (40 mg total) by mouth daily. 90 tablet 1   Cholecalciferol (VITAMIN D3) 1.25 MG (50000 UT) CAPS Take by mouth.     Cobalamin Combinations (B-12) [phone] MCG SUBL Take 1 tablet by mouth daily.     colchicine 0.6 MG tablet 2 tablets initially, then 1 one hour later; maintain 1 PO Q12hrs prn pain while active flare up     erythromycin ophthalmic ointment SMARTSIG:In Eye(s)     glimepiride (AMARYL) 4 MG tablet TAKE 1 TABLET (4 MG TOTAL) BY MOUTH IN THE MORNING AND AT BEDTIME 180 tablet 1   indomethacin (INDOCIN) 50 MG capsule TK ONE C PO TID PRN  levalbuterol (XOPENEX HFA) 45 MCG/ACT inhaler INL 2 PFS PO Q 6 H     lisinopril-hydrochlorothiazide (ZESTORETIC) 20-25 MG tablet Take 1 tablet by mouth daily.     loratadine (CLARITIN) 10 MG tablet TK 1 T PO D     losartan (COZAAR) 100 MG tablet TAKE 1 TABLET BY MOUTH EVERY DAY 90 tablet 1   meclizine (ANTIVERT) 12.5 MG tablet TAKE 1 TABLET (12.5 MG TOTAL) BY MOUTH IN THE MORNING, AT NOON, AND AT BEDTIME. 90 tablet 1   metFORMIN (GLUCOPHAGE) 500 MG tablet Take 2 tablets (1,000 mg total) by mouth 2 (two) times daily with a meal. 180 tablet 1   metoprolol tartrate (LOPRESSOR) 25 MG tablet Take 1 tablet (25 mg total) by mouth 2 (two) times daily. 180 tablet 3   omeprazole (PRILOSEC) 20 MG capsule Take 1 capsule (20 mg total) by mouth daily. 90 capsule 1   pioglitazone (ACTOS) 15 MG tablet Take 1 tablet (15 mg total) by mouth daily. 90 tablet 1   predniSONE (DELTASONE) 5 MG tablet Take 1 package every day by oral route for 6 days.     scopolamine  (TRANSDERM SCOP, 1.5 MG,) 1 MG/3DAYS UNWRAP AND APPLY 1 PATCH TO SKIN AS DIRECTED     simvastatin (ZOCOR) 40 MG tablet Take 1 tablet every day by oral route.     sitaGLIPtin (JANUVIA) 50 MG tablet Take 1 tablet by mouth daily.     sitaGLIPtin-metformin (JANUMET) 50-500 MG tablet Take 1 tablet twice a day by oral route.     triamcinolone cream (KENALOG) 0.5 % APP AA BID     venlafaxine (EFFEXOR) 75 MG tablet Take 1 tablet by mouth daily.     No current facility-administered medications on file prior to visit.    Allergies  Allergen Reactions   Lisinopril Swelling and Cough     Assessment/Plan:  1. Hypertension -  Patient BP in room 130/66 which is at goal of <140/90.  Home reading much more elevated and home cuff read 30-40 points higher.  Patient home cuff may not be accurate.  Recommended purchasing a new cuff and gave list of recommended [employer] cuffs.  Since patient reports occasional dizziness, will reduce metoprolol at this time to 12.'5mg'$  BID and to continue to monitor at home. Recommended he check BP and BG if he feels tired or dizzy to make sure he is not hypotensive or hypoglycemic.  Patient voiced understanding.   Continue amlodipine '5mg'$  daily Continue losartan '100mg'$  daily Reduce metoprolol to 12.'5mg'$  BID Recheck in 4 weeks  Karren Cobble, PharmD, [place], [place], [place], [place], [place], [address] Phone: [phone], Fax: [phone]

## 2022-02-22 NOTE — Patient Instructions (Addendum)
It was nice meeting you two today  We would like your blood pressure to be less than 140/90  Please continue your amlodipine '5mg'$  once a day and losartan '100mg'$  once a day  Reduce metoprolol to 12.'5mg'$  (1/2 of your '25mg'$  tablet) twice a day  If you feel dizzy or tired, check your blood pressure and your blood sugar  The blood pressure cuffs we recommend are made by [employer].  We recommend the 3 Series or higher  Karren Cobble, PharmD, [place], [place], [place], [place], [place], [address] Phone: [phone], Fax: [phone]

## 2022-02-23 ENCOUNTER — Encounter (HOSPITAL_BASED_OUTPATIENT_CLINIC_OR_DEPARTMENT_OTHER): Payer: Self-pay | Admitting: Physical Therapy

## 2022-02-23 ENCOUNTER — Ambulatory Visit (HOSPITAL_BASED_OUTPATIENT_CLINIC_OR_DEPARTMENT_OTHER): Payer: Medicare HMO | Attending: Cardiovascular Disease | Admitting: Physical Therapy

## 2022-02-23 DIAGNOSIS — R42 Dizziness and giddiness: Secondary | ICD-10-CM | POA: Insufficient documentation

## 2022-02-23 DIAGNOSIS — R2681 Unsteadiness on feet: Secondary | ICD-10-CM

## 2022-02-23 DIAGNOSIS — R262 Difficulty in walking, not elsewhere classified: Secondary | ICD-10-CM | POA: Insufficient documentation

## 2022-02-23 NOTE — Therapy (Deleted)
OUTPATIENT PHYSICAL THERAPY LOWER EXTREMITY EVALUATION   Patient Name: Jason [name] MRN: [id number] DOB:1936/06/21, 86 y.o., male Today's Date: 02/23/2022    Past Medical History:  Diagnosis Date   Ascending aortic aneurysm ([place]) 01/22/2022   CAD in native artery 01/22/2022   Cancer [facility])    Cataract    Diabetes mellitus without complication ([place])    Gait instability 01/22/2022   Hypertension    Sleep apnea    No past surgical history on file. 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   Gastroesophageal reflux disease 02/22/2022   Gout 02/22/2022   Benign paroxysmal positional vertigo 02/22/2022   CAD in native artery 01/22/2022   Ascending aortic aneurysm ([place]) 01/22/2022   Gait instability 01/22/2022   Essential hypertension 01/17/2022   Type 2 diabetes mellitus with obesity ([place]) 01/17/2022   HLD (hyperlipidemia) 01/17/2022   Obstructive sleep apnea 01/17/2022   Sensorineural hearing loss (SNHL), bilateral 11/09/2021   Orthostatic hypotension 11/09/2021   Imbalance 11/09/2021   Overweight 02/21/2020   First degree heart block 02/21/2020   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   Vitamin D deficiency 05/31/2016   History of malignant neoplasm of prostate 05/31/2016   History of malignant neoplasm of skin 05/31/2016   Bilateral hearing loss 05/31/2016   Anxiety state 05/31/2016   Bronchospasm 09/24/2010   Cough 09/19/2010   Acute bronchitis 09/19/2010   Allergic rhinitis due to pollen 09/19/2010   Acute sinusitis 09/19/2010    PCP: ***  REFERRING PROVIDER: ***  REFERRING DIAG: ***  THERAPY DIAG:  No diagnosis found.  Rationale for Evaluation and Treatment {HABREHAB:27488}  ONSET  DATE: ***  SUBJECTIVE:   SUBJECTIVE STATEMENT: ***  PERTINENT HISTORY: ***  PAIN:  Are you having pain? {OPRCPAIN:27236}  PRECAUTIONS: {Therapy precautions:24002}  WEIGHT BEARING RESTRICTIONS {Yes ***/No:24003}  FALLS:  Has patient fallen in last 6 months? {fallsyesno:27318}  LIVING ENVIRONMENT: Lives with: {OPRC lives with:25569::"lives with their family"} Lives in: {Lives in:25570} Stairs: {opstairs:27293} Has following equipment at home: {Assistive devices:23999}  OCCUPATION: ***  PLOF: {PLOF:24004}  PATIENT GOALS ***   OBJECTIVE:   DIAGNOSTIC FINDINGS: ***  PATIENT SURVEYS:  {rehab surveys:24030}  COGNITION:  Overall cognitive status: {cognition:24006}     SENSATION: {sensation:27233}  EDEMA:  {edema:24020}  MUSCLE LENGTH: Hamstrings: Right *** deg; Left *** deg Thomas test: Right *** deg; Left *** deg  POSTURE: {posture:25561}  PALPATION: ***  LOWER EXTREMITY ROM:  {AROM/PROM:27142} ROM Right eval Left eval  Hip flexion    Hip extension    Hip abduction    Hip adduction    Hip internal rotation    Hip external rotation    Knee flexion    Knee extension    Ankle dorsiflexion    Ankle plantarflexion    Ankle inversion    Ankle eversion     (Blank rows = not tested)  LOWER EXTREMITY MMT:  MMT Right eval Left eval  Hip flexion    Hip extension    Hip abduction    Hip adduction    Hip internal rotation    Hip external rotation    Knee flexion    Knee extension    Ankle dorsiflexion    Ankle plantarflexion    Ankle inversion  Ankle eversion     (Blank rows = not tested)  LOWER EXTREMITY SPECIAL TESTS:  {LEspecialtests:26242}  FUNCTIONAL TESTS:  {Functional tests:24029}  GAIT: Distance walked: *** Assistive device utilized: {Assistive devices:23999} Level of assistance: {Levels of assistance:24026} Comments: ***    TODAY'S TREATMENT: ***   PATIENT EDUCATION:  Education details: *** Person educated:  {Person educated:25204} Education method: {Education Method:25205} Education comprehension: {Education Comprehension:25206}   HOME EXERCISE PROGRAM: ***  ASSESSMENT:  CLINICAL IMPRESSION: Patient is a *** y.o. *** who was seen today for physical therapy evaluation and treatment for ***.    OBJECTIVE IMPAIRMENTS {opptimpairments:25111}.   ACTIVITY LIMITATIONS {activitylimitations:27494}  PARTICIPATION LIMITATIONS: {participationrestrictions:25113}  PERSONAL FACTORS {Personal factors:25162} are also affecting patient's functional outcome.   REHAB POTENTIAL: {rehabpotential:25112}  CLINICAL DECISION MAKING: {clinical decision making:25114}  EVALUATION COMPLEXITY: {Evaluation complexity:25115}   GOALS: Goals reviewed with patient? {yes/no:20286}  SHORT TERM GOALS: Target date: {follow up:25551}  *** Baseline: Goal status: {GOALSTATUS:25110}  2.  *** Baseline:  Goal status: {GOALSTATUS:25110}  3.  *** Baseline:  Goal status: {GOALSTATUS:25110}  4.  *** Baseline:  Goal status: {GOALSTATUS:25110}  5.  *** Baseline:  Goal status: {GOALSTATUS:25110}  6.  *** Baseline:  Goal status: {GOALSTATUS:25110}  LONG TERM GOALS: Target date: {follow up:25551}   *** Baseline:  Goal status: {GOALSTATUS:25110}  2.  *** Baseline:  Goal status: {GOALSTATUS:25110}  3.  *** Baseline:  Goal status: {GOALSTATUS:25110}  4.  *** Baseline:  Goal status: {GOALSTATUS:25110}  5.  *** Baseline:  Goal status: {GOALSTATUS:25110}  6.  *** Baseline:  Goal status: {GOALSTATUS:25110}   PLAN: PT FREQUENCY: {rehab frequency:25116}  PT DURATION: {rehab duration:25117}  PLANNED INTERVENTIONS: {rehab planned interventions:25118::"Therapeutic exercises","Therapeutic activity","Neuromuscular re-education","Balance training","Gait training","Patient/Family education","Joint mobilization"}  PLAN FOR NEXT SESSION: ***   Carney Living, PT 02/23/2022, 1:37 PM

## 2022-02-23 NOTE — Therapy (Incomplete)
OUTPATIENT PHYSICAL THERAPY LOWER EXTREMITY EVALUATION   Patient Name: Jason [name] MRN: [id number] DOB:Feb 16, 1936, 86 y.o., male Today's Date: 02/23/2022    Past Medical History:  Diagnosis Date   Ascending aortic aneurysm ([place]) 01/22/2022   CAD in native artery 01/22/2022   Cancer [facility])    Cataract    Diabetes mellitus without complication ([place])    Gait instability 01/22/2022   Hypertension    Sleep apnea    No past surgical history on file. 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   Gastroesophageal reflux disease 02/22/2022   Gout 02/22/2022   Benign paroxysmal positional vertigo 02/22/2022   CAD in native artery 01/22/2022   Ascending aortic aneurysm ([place]) 01/22/2022   Gait instability 01/22/2022   Essential hypertension 01/17/2022   Type 2 diabetes mellitus with obesity ([place]) 01/17/2022   HLD (hyperlipidemia) 01/17/2022   Obstructive sleep apnea 01/17/2022   Sensorineural hearing loss (SNHL), bilateral 11/09/2021   Orthostatic hypotension 11/09/2021   Imbalance 11/09/2021   Overweight 02/21/2020   First degree heart block 02/21/2020   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   Vitamin D deficiency 05/31/2016   History of malignant neoplasm of prostate 05/31/2016   History of malignant neoplasm of skin 05/31/2016   Bilateral hearing loss 05/31/2016   Anxiety state 05/31/2016   Bronchospasm 09/24/2010   Cough 09/19/2010   Acute bronchitis 09/19/2010   Allergic rhinitis due to pollen 09/19/2010   Acute sinusitis 09/19/2010    PCP: [name], MD  REFERRING PROVIDER: Skeet Latch, MD  REFERRING DIAG: ***  THERAPY DIAG:  No diagnosis found.  Rationale for Evaluation and  Treatment Rehabilitation  ONSET DATE: 1 year ago  SUBJECTIVE:   SUBJECTIVE STATEMENT: Patient reports an insidious onset of generalized LE weakness and balance deficits that began approx 1 year ago. He   PERTINENT HISTORY: ***  PAIN:  Are you having pain? No  PRECAUTIONS: None  WEIGHT BEARING RESTRICTIONS No  FALLS:  Has patient fallen in last 6 months? Yes. Number of falls 3  LIVING ENVIRONMENT: Lives with: lives with their spouse Lives in: House/apartment Stairs: No Has following equipment at home: Single point cane  OCCUPATION: Retired  PLOF: Independent  PATIENT GOALS  Improve walking, fatigue   OBJECTIVE:    VITALS  HR 77     SPO2 97 BP 129/71  Vitals assessed in seated following 10 minutes of rest while taking subjective and history.  BP 98/61 Assessed after immediately after standing.   DIAGNOSTIC FINDINGS: ***  Chest x-ray, 01/18/22 IMPRESSION: No acute cardiopulmonary findings. Stable left basilar scarring changes.  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  EDEMA:  {edema:24020}  POSTURE: {posture:25561}   LOWER EXTREMITY ROM:  Active ROM Right eval Left eval  Hip flexion    Hip extension    Hip abduction    Hip adduction    Hip internal rotation    Hip external rotation    Knee flexion    Knee extension    Ankle dorsiflexion    Ankle plantarflexion    Ankle inversion    Ankle eversion     (  Blank rows = not tested)  LOWER EXTREMITY MMT:  MMT Right eval Left eval  Hip flexion 20.7  20.9  Hip extension    Hip abduction 49.6 43.6  Hip adduction    Hip internal rotation    Hip external rotation    Knee flexion 43.3 39.2  Knee extension 41.8 36.2  Ankle dorsiflexion 48.4 46.8  Ankle plantarflexion    Ankle inversion    Ankle eversion     (Blank rows = not tested)    Grip  strength: R 70  L 55   GAIT:    TODAY'S TREATMENT:    PATIENT EDUCATION:  Education details: *** Person educated: Patient Education method: Explanation Education comprehension: verbalized understanding   HOME EXERCISE PROGRAM: ***  ASSESSMENT:  CLINICAL IMPRESSION: Patient is a 86 y.o. M who was seen today for physical therapy evaluation and treatment for ***.    OBJECTIVE IMPAIRMENTS cardiopulmonary status limiting activity, decreased balance, difficulty walking, decreased ROM, decreased strength, and impaired sensation.   ACTIVITY LIMITATIONS sitting, standing, and locomotion level  PARTICIPATION LIMITATIONS: community activity and exercise routine  PERSONAL FACTORS Age, Fitness, and Time since onset of injury/illness/exacerbation are also affecting patient's functional outcome.   REHAB POTENTIAL: Good  CLINICAL DECISION MAKING: Evolving/moderate complexity  EVALUATION COMPLEXITY: Moderate   GOALS: Goals reviewed with patient? Yes  SHORT TERM GOALS: Target date: {follow up:25551}  *** Baseline: Goal status: {GOALSTATUS:25110}  2.  *** Baseline:  Goal status: {GOALSTATUS:25110}  3.  *** Baseline:  Goal status: {GOALSTATUS:25110}  4.  *** Baseline:  Goal status: {GOALSTATUS:25110}  5.  *** Baseline:  Goal status: {GOALSTATUS:25110}  6.  *** Baseline:  Goal status: {GOALSTATUS:25110}  LONG TERM GOALS: Target date: {follow up:25551}   *** Baseline:  Goal status: {GOALSTATUS:25110}  2.  *** Baseline:  Goal status: {GOALSTATUS:25110}  3.  *** Baseline:  Goal status: {GOALSTATUS:25110}  4.  *** Baseline:  Goal status: {GOALSTATUS:25110}  5.  *** Baseline:  Goal status: {GOALSTATUS:25110}  6.  *** Baseline:  Goal status: {GOALSTATUS:25110}   PLAN: PT FREQUENCY: 1-2x/week  PT DURATION: 6 weeks  PLANNED INTERVENTIONS: Therapeutic exercises, Therapeutic activity, Neuromuscular re-education, Balance training, Gait training,  Patient/Family education, Joint mobilization, Aquatic Therapy, Dry Needling, Cryotherapy, Moist heat, and Ionotophoresis '4mg'$ /ml Dexamethasone  PLAN FOR NEXT SESSION: BERG, 6MWT   Lenell Antu, Student-PT 02/23/2022, 1:43 PM

## 2022-02-24 NOTE — Therapy (Signed)
OUTPATIENT PHYSICAL THERAPY LOWER EXTREMITY EVALUATION   Patient Name: Jason [name] MRN: [id number] DOB:14-Feb-1936, 86 y.o., male Today's Date: 02/24/2022   PT End of Session - 02/24/22 0828     Visit Number 1    Number of Visits 13    Date for PT Re-Evaluation 04/07/22    PT Start Time 7672    PT Stop Time 1427    PT Time Calculation (min) 44 min    Activity Tolerance Patient tolerated treatment well    Behavior During Therapy [facility] for tasks assessed/performed             Past Medical History:  Diagnosis Date   Ascending aortic aneurysm ([place]) 01/22/2022   CAD in native artery 01/22/2022   Cancer [facility])    Cataract    Diabetes mellitus without complication ([place])    Gait instability 01/22/2022   Hypertension    Sleep apnea    History reviewed. No pertinent surgical history. 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   Gastroesophageal reflux disease 02/22/2022   Gout 02/22/2022   Benign paroxysmal positional vertigo 02/22/2022   CAD in native artery 01/22/2022   Ascending aortic aneurysm ([place]) 01/22/2022   Gait instability 01/22/2022   Essential hypertension 01/17/2022   Type 2 diabetes mellitus with obesity ([place]) 01/17/2022   HLD (hyperlipidemia) 01/17/2022   Obstructive sleep apnea 01/17/2022   Sensorineural hearing loss (SNHL), bilateral 11/09/2021   Orthostatic hypotension 11/09/2021   Imbalance 11/09/2021   Overweight 02/21/2020   First degree heart block 02/21/2020   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   Vitamin D deficiency 05/31/2016   History of malignant neoplasm of prostate 05/31/2016   History of malignant neoplasm of skin 05/31/2016   Bilateral hearing loss 05/31/2016    Anxiety state 05/31/2016   Bronchospasm 09/24/2010   Cough 09/19/2010   Acute bronchitis 09/19/2010   Allergic rhinitis due to pollen 09/19/2010   Acute sinusitis 09/19/2010    PCP: [name], MD  REFERRING PROVIDER: Skeet Latch, MD  REFERRING DIAG: R26.81 (ICD-10-CM) - Gait instability  THERAPY DIAG:  Unsteadiness on feet  Difficulty in walking, not elsewhere classified  Dizziness and giddiness  Rationale for Evaluation and Treatment Rehabilitation  ONSET DATE: 1 year ago  SUBJECTIVE:   SUBJECTIVE STATEMENT: Patient reports an insidious onset of generalized LE weakness and balance deficits that began approx 1 year ago. He has noticed a general decline in his functional activity as a result - he has stopped playing golf and walking for exercise since. He also reports dizziness and lightheadedness when first getting out of bed or a chair; which he attributes to fluctuating blood pressure and medications. He reports multiple falls in the past few months as a result; no significant injuries associated. He reports most of his physical activity is walking to mailbox/trash can and back, approx 157f. He feels winded and fatigued following. He denies any shortness of breath or chest pains with activity. He wants to improve his strength, endurance, and balance to improve function and generalized fitness.   PERTINENT HISTORY: CAD, ascending aortic aneurysm, T2DM, HTN, sleep apnea, orthostatic hypotension, gout  PAIN:  Are you having pain? No  PRECAUTIONS: None  WEIGHT BEARING RESTRICTIONS No  FALLS:  Has patient fallen  in last 6 months? Yes. Number of falls 3  LIVING ENVIRONMENT: Lives with: lives with their spouse Lives in: House/apartment Stairs: No Has following equipment at home: Single point cane  OCCUPATION: Retired  PLOF: Independent  PATIENT GOALS  Improve walking, fatigue   OBJECTIVE:    VITALS  HR 77     SPO2 97 BP 129/71  Vitals assessed  in seated following 10 minutes of rest while taking subjective and history.  BP 98/61 Assessed after immediately after standing.   DIAGNOSTIC FINDINGS:   Chest x-ray, 01/18/22 IMPRESSION: No acute cardiopulmonary findings. Stable left basilar scarring changes.  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  POSTURE: rounded shoulders and increased lumbar lordosis   LOWER EXTREMITY ROM:  Active ROM Right eval Left eval  Hip flexion    Hip extension    Hip abduction    Hip adduction    Hip internal rotation    Hip external rotation    Knee flexion    Knee extension WNL WNL  Ankle dorsiflexion WNL WNL  Ankle plantarflexion    Ankle inversion    Ankle eversion     (Blank rows = not tested)  LOWER EXTREMITY MMT:  MMT Right eval Left eval  Hip flexion 20.7  20.9  Hip extension    Hip abduction 49.6 43.6  Hip adduction    Hip internal rotation    Hip external rotation    Knee flexion 43.3 39.2  Knee extension 41.8 36.2  Ankle dorsiflexion 48.4 46.8  Ankle plantarflexion    Ankle inversion    Ankle eversion     (Blank rows = not tested)    Grip strength: R 70, L 55   GAIT:  Patient ambulates with bil Trendelenburg pattern, decreased stride length and stance time, L>R. He has moderate difficulty turning in place.   TODAY'S TREATMENT:  Eval Seated marches, LAQ, ankle pumps, bicep curls - given instructions to perform at EOB or seated before standing to increase BP.  PATIENT EDUCATION:  Education details: PT plan of care, HEP, orthostatic hypotension symptom monitoring Person educated: Patient Education method: Explanation Education comprehension: verbalized understanding   HOME EXERCISE PROGRAM: Access Code: [email or web address] URL: [email or web address] Date:  02/24/2022 Prepared by: Carolyne Littles  ASSESSMENT:  CLINICAL IMPRESSION: Patient is a 86 y.o. M who was seen today for physical therapy evaluation and treatment for gait instability. He presents with decreased strength, ROM, static and dynamic balance, cardiovascular endurance, and function. He has a drop in BP from sit to stand from 129/71 to 98/61, consistent with orthostatic hypotension. He reported some dizziness that subsided after approx 1 min. He discussed medications with his primary day prior and has made adjustments. He will gain most benefit from therapy to address balance, stability, and posture in standing and walking. He will also need education on proper gait mechanics and safety with ambulation/standing activities. He will benefit from skilled therapy to address impairments, improve function, and increase aerobic fitness.   OBJECTIVE IMPAIRMENTS cardiopulmonary status limiting activity, decreased balance, difficulty walking, decreased ROM, decreased strength, and impaired sensation.   ACTIVITY LIMITATIONS sitting, standing, and locomotion level  PARTICIPATION LIMITATIONS: community activity and exercise routine  PERSONAL FACTORS Age, Fitness, and Time since onset of injury/illness/exacerbation are also affecting patient's functional outcome.   REHAB POTENTIAL: Good  CLINICAL DECISION  MAKING: Evolving/moderate complexity  EVALUATION COMPLEXITY: Moderate   GOALS: Goals reviewed with patient? Yes  SHORT TERM GOALS: Target date:  03/16/2022 Patient will improve bil LE strength by 20%. Baseline: Goal status: INITIAL  2.  Patient will be able to stand from normal height chair without use of UE or reported symptoms of dizziness. Baseline:  Goal status: INITIAL    LONG TERM GOALS: Target date: 04/06/2022 Patient will be able to walk to and from mailbox without fatigue or loss of balance. Baseline:  Goal status: INITIAL  2.  Patient will be independent with exercise  routine to maintain and improve level of fitness. Baseline:  Goal status: INITIAL   Additional short and long term goals to be made based on scoring on BERG and 6MWT assessments to be performed at next visit.   PLAN: PT FREQUENCY: 1-2x/week  PT DURATION: 6 weeks  PLANNED INTERVENTIONS: Therapeutic exercises, Therapeutic activity, Neuromuscular re-education, Balance training, Gait training, Patient/Family education, Joint mobilization, Aquatic Therapy, Dry Needling, Cryotherapy, Moist heat, and Ionotophoresis '4mg'$ /ml Dexamethasone  PLAN FOR NEXT SESSION: BERG, 6MWT. Create goals accordingly. On subsequent visits begin interval training and balance training. Talk to the patient about the pool for balance. General strengthening; advance HEP. Monitor B/P RPE and HR with interval training    Carney Living, PT 02/24/2022, 10:22 AM   Lenell Antu, Student-PT During this treatment session, the therapist was present, participating in and directing the treatment.

## 2022-02-26 ENCOUNTER — Ambulatory Visit (HOSPITAL_BASED_OUTPATIENT_CLINIC_OR_DEPARTMENT_OTHER): Payer: Medicare HMO | Admitting: Physical Therapy

## 2022-02-26 DIAGNOSIS — R42 Dizziness and giddiness: Secondary | ICD-10-CM

## 2022-02-26 DIAGNOSIS — R262 Difficulty in walking, not elsewhere classified: Secondary | ICD-10-CM | POA: Diagnosis not present

## 2022-02-26 DIAGNOSIS — R2681 Unsteadiness on feet: Secondary | ICD-10-CM

## 2022-02-26 NOTE — Therapy (Signed)
OUTPATIENT PHYSICAL THERAPY LOWER EXTREMITY Treatment    Patient Name: Jason [name] MRN: [id number] DOB:[birth year], 86 y.o., male Today's Date: 03/01/2022   PT End of Session - 03/01/22 0735     Visit Number 2    Number of Visits 13    Date for PT Re-Evaluation 04/07/22    PT Start Time 3154    PT Stop Time 1228    PT Time Calculation (min) 43 min    Activity Tolerance Patient tolerated treatment well    Behavior During Therapy [facility] for tasks assessed/performed              Past Medical History:  Diagnosis Date   Ascending aortic aneurysm ([place]) 01/22/2022   CAD in native artery 01/22/2022   Cancer [facility])    Cataract    Diabetes mellitus without complication ([place])    Gait instability 01/22/2022   Hypertension    Sleep apnea    History reviewed. No pertinent surgical history. 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   Gastroesophageal reflux disease 02/22/2022   Gout 02/22/2022   Benign paroxysmal positional vertigo 02/22/2022   CAD in native artery 01/22/2022   Ascending aortic aneurysm ([place]) 01/22/2022   Gait instability 01/22/2022   Essential hypertension 01/17/2022   Type 2 diabetes mellitus with obesity ([place]) 01/17/2022   HLD (hyperlipidemia) 01/17/2022   Obstructive sleep apnea 01/17/2022   Sensorineural hearing loss (SNHL), bilateral 11/09/2021   Orthostatic hypotension 11/09/2021   Imbalance 11/09/2021   Overweight 02/21/2020   First degree heart block 02/21/2020   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   Vitamin D deficiency 05/31/2016   History of malignant neoplasm of prostate 05/31/2016   History of malignant neoplasm of skin 05/31/2016   Bilateral hearing loss 05/31/2016    Anxiety state 05/31/2016   Bronchospasm 09/24/2010   Cough 09/19/2010   Acute bronchitis 09/19/2010   Allergic rhinitis due to pollen 09/19/2010   Acute sinusitis 09/19/2010    PCP: [name], MD  REFERRING PROVIDER: [name], MD  REFERRING DIAG: R26.81 (ICD-10-CM) - Gait instability  THERAPY DIAG:  Unsteadiness on feet  Difficulty in walking, not elsewhere classified  Dizziness and giddiness  Rationale for Evaluation and Treatment Rehabilitation  ONSET DATE: 1 year ago  SUBJECTIVE:   SUBJECTIVE STATEMENT: The patient has no new complaints today. He reports no pain or issues with his HEP . He is motivated to get stronger.   PERTINENT HISTORY: CAD, ascending aortic aneurysm, T2DM, HTN, sleep apnea, orthostatic hypotension, gout  PAIN:  Are you having pain? No  PRECAUTIONS: None  WEIGHT BEARING RESTRICTIONS No  FALLS:  Has patient fallen in last 6 months? Yes. Number of falls 3  LIVING ENVIRONMENT: Lives with: lives with their spouse Lives in: House/apartment Stairs: No Has following equipment at home: Single point cane  OCCUPATION: Retired  PLOF: Independent  PATIENT GOALS  Improve walking, fatigue   OBJECTIVE:    VITALS 7/14  HR 77     SPO2 97 BP 159/82  After 6 min walk 160/88 HR 78  Sao2 97      DIAGNOSTIC FINDINGS:   Chest x-ray, 01/18/22 IMPRESSION: No acute cardiopulmonary findings. Stable left basilar scarring changes.  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  POSTURE: rounded shoulders and increased lumbar lordosis   LOWER EXTREMITY ROM:  Active ROM Right eval Left eval  Hip flexion    Hip extension    Hip abduction    Hip adduction    Hip internal rotation    Hip external rotation    Knee flexion    Knee extension WNL WNL   Ankle dorsiflexion WNL WNL  Ankle plantarflexion    Ankle inversion    Ankle eversion     (Blank rows = not tested)  LOWER EXTREMITY MMT:  MMT Right eval Left eval  Hip flexion 20.7  20.9  Hip extension    Hip abduction 49.6 43.6  Hip adduction    Hip internal rotation    Hip external rotation    Knee flexion 43.3 39.2  Knee extension 41.8 36.2  Ankle dorsiflexion 48.4 46.8  Ankle plantarflexion    Ankle inversion    Ankle eversion     (Blank rows = not tested)    Grip strength: R 70, L 55   GAIT:  Patient ambulates with bil Trendelenburg pattern, decreased stride length and stance time, L>R. He has moderate difficulty turning in place.   TODAY'S TREATMENT: 7/17  Berg balance testing: see flow sheet score 41/57  6 min walk test: 815 ft with 1 40 sec seated rest break. No major change in HR or B/P   Eval Seated marches, LAQ, ankle pumps, bicep curls - given instructions to perform at EOB or seated before standing to increase BP.  PATIENT EDUCATION:  Education details: PT plan of care, HEP, orthostatic hypotension symptom monitoring Person educated: Patient Education method: Explanation Education comprehension: verbalized understanding   HOME EXERCISE PROGRAM: Access Code: [email or web address] URL: [email or web address] Date: 02/24/2022 Prepared by: Carolyne Littles  BERG Balance Test          Date:   Sit to Stand   Standing unsupported   Sitting with back unsupported but feet supported   Stand to sit    Transfers    Standing unsupported with eyes closed   Standing unsupported feet together   From standing position, reach forward with outstretched arm   From standing position, pick up object from floor   From standing position, turn and look behind over each shoulder   Turn 360   Standing unsupported, alternately place foot on step   Standing unsupported, one foot in front   Standing on one leg   Total:       ASSESSMENT:  CLINICAL  IMPRESSION: The patient required CGA for 6 min walk test. He required one short seated rest break. He had less fluctuation in his B/P today with activity. His balance is fair accorsing to BERG balancetesting. Next visit we will expand his HEP and begin balance and endurance training.  OBJECTIVE IMPAIRMENTS cardiopulmonary status limiting activity, decreased balance, difficulty walking, decreased ROM, decreased strength, and impaired sensation.   ACTIVITY LIMITATIONS sitting, standing, and locomotion level  PARTICIPATION LIMITATIONS: community activity and exercise routine  PERSONAL FACTORS Age, Fitness, and Time since onset of injury/illness/exacerbation are also affecting patient's functional outcome.   REHAB POTENTIAL: Good  CLINICAL DECISION MAKING: Evolving/moderate complexity  EVALUATION COMPLEXITY: Moderate   GOALS: Goals reviewed with patient? Yes  SHORT TERM GOALS: Target date:  03/16/2022 Patient will improve bil LE strength by 20%.  Baseline: Goal status: INITIAL  2.  Patient will be able to stand from normal height chair without use of UE or reported symptoms of dizziness. Baseline:  Goal status: INITIAL  3.  Patient will increase BERG score by 4 points  Baseline:  Goal status: INITIAL   4.  Patient will increase 6 min walk test by 120'  Baseline:  Goal status: INITIAL     LONG TERM GOALS: Target date: 04/06/2022 Patient will be able to walk to and from mailbox without fatigue or loss of balance. Baseline:  Goal status: INITIAL  2.  Patient will be independent with exercise routine to maintain and improve level of fitness. Baseline:  Goal status: INITIAL  3.  Patient's BERG score will be >52 in order to demonstrate a decreased fall risk Baseline:  Goal status: INITIAL    Additional short and long term goals to be made based on scoring on BERG and 6MWT assessments to be performed at next visit.   PLAN: PT FREQUENCY: 1-2x/week  PT DURATION: 6  weeks  PLANNED INTERVENTIONS: Therapeutic exercises, Therapeutic activity, Neuromuscular re-education, Balance training, Gait training, Patient/Family education, Joint mobilization, Aquatic Therapy, Dry Needling, Cryotherapy, Moist heat, and Ionotophoresis '4mg'$ /ml Dexamethasone  PLAN FOR NEXT SESSION: BERG, 6MWT consider light interval training 1 1/2 to 2 min intervals while monitoring the patients HR and potential syncope; consider static balance training although the patient had more trouble with dynamic movements on the BERG; consider an UE or LE seated series for his HEP.   Carney Living, PT 03/01/2022, 7:39 AM   Lenell Antu, Student-PT During this treatment session, the therapist was present, participating in and directing the treatment.

## 2022-03-01 ENCOUNTER — Encounter (HOSPITAL_BASED_OUTPATIENT_CLINIC_OR_DEPARTMENT_OTHER): Payer: Self-pay | Admitting: Physical Therapy

## 2022-03-02 ENCOUNTER — Encounter (HOSPITAL_BASED_OUTPATIENT_CLINIC_OR_DEPARTMENT_OTHER): Payer: Self-pay | Admitting: Physical Therapy

## 2022-03-02 ENCOUNTER — Telehealth: Payer: Self-pay | Admitting: Family Medicine

## 2022-03-02 ENCOUNTER — Ambulatory Visit (HOSPITAL_BASED_OUTPATIENT_CLINIC_OR_DEPARTMENT_OTHER): Payer: Medicare HMO | Admitting: Physical Therapy

## 2022-03-02 DIAGNOSIS — R262 Difficulty in walking, not elsewhere classified: Secondary | ICD-10-CM

## 2022-03-02 DIAGNOSIS — R2681 Unsteadiness on feet: Secondary | ICD-10-CM

## 2022-03-02 DIAGNOSIS — R42 Dizziness and giddiness: Secondary | ICD-10-CM

## 2022-03-02 NOTE — Telephone Encounter (Signed)
Called pt to move appointment to sooner date

## 2022-03-02 NOTE — Telephone Encounter (Signed)
Contacted by patient's physical therapist, orthostatic during treatment today.  Concern for possible clonus.  Please have him check his blood pressures at home and if readings below 253 systolic should be seen right away.  Make sure he is drinking sufficient fluids, and is not taking any additional blood pressure medicine as his blood pressure looked okay in the office last visit.  Appears he has a visit with me in August, but let's see if we can get him in within the next week for an office visit with blood pressure check and orthostatics.  Let me know if there are questions.

## 2022-03-02 NOTE — Therapy (Signed)
OUTPATIENT PHYSICAL THERAPY LOWER EXTREMITY Treatment    Patient Name: Jason [name] MRN: [id number] DOB:November 07, 1935, 86 y.o., male Today's Date: 03/02/2022   PT End of Session - 03/02/22 1039     Visit Number 3    Number of Visits 13    Date for PT Re-Evaluation 04/07/22    PT Start Time 1017    PT Stop Time 1057    PT Time Calculation (min) 40 min    Activity Tolerance Patient tolerated treatment well    Behavior During Therapy [facility] for tasks assessed/performed               Past Medical History:  Diagnosis Date   Ascending aortic aneurysm ([place]) 01/22/2022   CAD in native artery 01/22/2022   Cancer [facility])    Cataract    Diabetes mellitus without complication ([place])    Gait instability 01/22/2022   Hypertension    Sleep apnea    History reviewed. No pertinent surgical history. 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   Gastroesophageal reflux disease 02/22/2022   Gout 02/22/2022   Benign paroxysmal positional vertigo 02/22/2022   CAD in native artery 01/22/2022   Ascending aortic aneurysm ([place]) 01/22/2022   Gait instability 01/22/2022   Essential hypertension 01/17/2022   Type 2 diabetes mellitus with obesity ([place]) 01/17/2022   HLD (hyperlipidemia) 01/17/2022   Obstructive sleep apnea 01/17/2022   Sensorineural hearing loss (SNHL), bilateral 11/09/2021   Orthostatic hypotension 11/09/2021   Imbalance 11/09/2021   Overweight 02/21/2020   First degree heart block 02/21/2020   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   Vitamin D deficiency 05/31/2016   History of malignant neoplasm of prostate 05/31/2016   History of malignant neoplasm of skin 05/31/2016   Bilateral hearing loss 05/31/2016    Anxiety state 05/31/2016   Bronchospasm 09/24/2010   Cough 09/19/2010   Acute bronchitis 09/19/2010   Allergic rhinitis due to pollen 09/19/2010   Acute sinusitis 09/19/2010    PCP: [name], MD  REFERRING PROVIDER: [name], MD  REFERRING DIAG: R26.81 (ICD-10-CM) - Gait instability  THERAPY DIAG:  Unsteadiness on feet  Difficulty in walking, not elsewhere classified  Dizziness and giddiness  Rationale for Evaluation and Treatment Rehabilitation  ONSET DATE: 1 year ago  SUBJECTIVE:   SUBJECTIVE STATEMENT: I'm doing OK, I overslept a bit. Felt OK after last session. BP is still on the high side, I did not take my pressure this morning I usually do but I did not today bc I was so off my routine. I'd like you to check my orthostatic BP today just in case.   PERTINENT HISTORY: CAD, ascending aortic aneurysm, T2DM, HTN, sleep apnea, orthostatic hypotension, gout  PAIN:  Are you having pain? No "just tired"   PRECAUTIONS: None  WEIGHT BEARING RESTRICTIONS No  FALLS:  Has patient fallen in last 6 months? Yes. Number of falls 3  LIVING ENVIRONMENT: Lives with: lives with their spouse Lives in: House/apartment Stairs: No Has following equipment at home: Single point cane  OCCUPATION: Retired  PLOF: Independent  PATIENT GOALS  Improve walking, fatigue   7/18  BP 110/70 manual cuff sitting  BP standing 80/60 standing x3 minutes symptomatic   Bridges with red TB 1x15 2  second holds  Supine clams 1x15 red TB  Bridge + clam 1x10 red TB  Sidelying hip ABD 1x10 B   Took 2nd sitting BP 130/80        OBJECTIVE:    VITALS 7/14  HR 77     SPO2 97 BP 159/82  After 6 min walk 160/88 HR 78  Sao2 97      DIAGNOSTIC FINDINGS:   Chest x-ray, 01/18/22 IMPRESSION: No acute cardiopulmonary findings. Stable left basilar scarring changes.  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  POSTURE: rounded shoulders and increased lumbar lordosis   LOWER EXTREMITY ROM:  Active ROM Right eval Left eval  Hip flexion    Hip extension    Hip abduction    Hip adduction    Hip internal rotation    Hip external rotation    Knee flexion    Knee extension WNL WNL  Ankle dorsiflexion WNL WNL  Ankle plantarflexion    Ankle inversion    Ankle eversion     (Blank rows = not tested)  LOWER EXTREMITY MMT:  MMT Right eval Left eval  Hip flexion 20.7  20.9  Hip extension    Hip abduction 49.6 43.6  Hip adduction    Hip internal rotation    Hip external rotation    Knee flexion 43.3 39.2  Knee extension 41.8 36.2  Ankle dorsiflexion 48.4 46.8  Ankle plantarflexion    Ankle inversion    Ankle eversion     (Blank rows = not tested)    Grip strength: R 70, L 55   GAIT:  Patient ambulates with bil Trendelenburg pattern, decreased stride length and stance time, L>R. He has moderate difficulty turning in place.   TODAY'S TREATMENT: 7/17  Berg balance testing: see flow sheet score 41/57  6 min walk test: 815 ft with 1 40 sec seated rest break. No major change in HR or B/P   Eval Seated marches, LAQ, ankle pumps, bicep curls - given instructions to perform at EOB or seated before standing to increase BP.  PATIENT EDUCATION:  Education details: education on orthostatic BPs, monitor BP and keep an eye out for any other symptoms through the day, f/u with PCP due to BPs today and clonus noted  Person educated: Patient Education method: Explanation Education comprehension: verbalized understanding   HOME EXERCISE PROGRAM: Access Code: [email or web address] URL: [email or web address] Date: 02/24/2022 Prepared by: Carolyne Littles  BERG Balance Test          Date:   Sit to Stand   Standing unsupported   Sitting with back unsupported  but feet supported   Stand to sit    Transfers    Standing unsupported with eyes closed   Standing unsupported feet together   From standing position, reach forward with outstretched arm   From standing position, pick up object from floor   From standing position, turn and look behind over each shoulder   Turn 360   Standing unsupported, alternately place foot on step   Standing unsupported, one foot in front   Standing on one leg   Total:       ASSESSMENT:  CLINICAL IMPRESSION:  Letroy arrives today doing well, didn't check BP today and asked me to do so. Sitting 110/70, standing x2 minutes 80/60 and symptomatic. Educated  on possible outcomes of OH BP, encouraged him to f/u with PCP. We worked on supine and sitting tasks for hip and ankle strength to try to help mm involved in balance, did not really do anything in standing today. Will continue to monitor BPs and progress as able/tolerated. Also noted some ataxia with open chain exercises, clonus + BLEs but no other neurological signs noted, advised him to monitor for anything unusual.   OBJECTIVE IMPAIRMENTS cardiopulmonary status limiting activity, decreased balance, difficulty walking, decreased ROM, decreased strength, and impaired sensation.   ACTIVITY LIMITATIONS sitting, standing, and locomotion level  PARTICIPATION LIMITATIONS: community activity and exercise routine  PERSONAL FACTORS Age, Fitness, and Time since onset of injury/illness/exacerbation are also affecting patient's functional outcome.   REHAB POTENTIAL: Good  CLINICAL DECISION MAKING: Evolving/moderate complexity  EVALUATION COMPLEXITY: Moderate   GOALS: Goals reviewed with patient? Yes  SHORT TERM GOALS: Target date:  03/16/2022 Patient will improve bil LE strength by 20%. Baseline: Goal status: INITIAL  2.  Patient will be able to stand from normal height chair without use of UE or reported symptoms of dizziness. Baseline:  Goal status: INITIAL  3.   Patient will increase BERG score by 4 points  Baseline:  Goal status: INITIAL   4.  Patient will increase 6 min walk test by 120'  Baseline:  Goal status: INITIAL     LONG TERM GOALS: Target date: 04/06/2022 Patient will be able to walk to and from mailbox without fatigue or loss of balance. Baseline:  Goal status: INITIAL  2.  Patient will be independent with exercise routine to maintain and improve level of fitness. Baseline:  Goal status: INITIAL  3.  Patient's BERG score will be >52 in order to demonstrate a decreased fall risk Baseline:  Goal status: INITIAL    Additional short and long term goals to be made based on scoring on BERG and 6MWT assessments to be performed at next visit.   PLAN: PT FREQUENCY: 1-2x/week  PT DURATION: 6 weeks  PLANNED INTERVENTIONS: Therapeutic exercises, Therapeutic activity, Neuromuscular re-education, Balance training, Gait training, Patient/Family education, Joint mobilization, Aquatic Therapy, Dry Needling, Cryotherapy, Moist heat, and Ionotophoresis '4mg'$ /ml Dexamethasone  PLAN FOR NEXT SESSION: BERG, 6MWT consider light interval training 1 1/2 to 2 min intervals while monitoring the patients HR and potential syncope; consider static balance training although the patient had more trouble with dynamic movements on the BERG; consider an UE or LE seated series for his HEP.    Ann Lions PT DPT PN2  03/02/2022, 10:57 AM

## 2022-03-03 ENCOUNTER — Other Ambulatory Visit: Payer: Self-pay | Admitting: Family Medicine

## 2022-03-03 DIAGNOSIS — E785 Hyperlipidemia, unspecified: Secondary | ICD-10-CM

## 2022-03-04 ENCOUNTER — Ambulatory Visit (INDEPENDENT_AMBULATORY_CARE_PROVIDER_SITE_OTHER): Payer: Medicare HMO | Admitting: Family Medicine

## 2022-03-04 ENCOUNTER — Ambulatory Visit (HOSPITAL_BASED_OUTPATIENT_CLINIC_OR_DEPARTMENT_OTHER): Payer: Medicare HMO | Admitting: Physical Therapy

## 2022-03-04 ENCOUNTER — Encounter: Payer: Self-pay | Admitting: Family Medicine

## 2022-03-04 VITALS — BP 138/68 | HR 82 | Temp 97.6°F | Resp 20 | Ht 73.0 in | Wt 271.4 lb

## 2022-03-04 DIAGNOSIS — E669 Obesity, unspecified: Secondary | ICD-10-CM

## 2022-03-04 DIAGNOSIS — K219 Gastro-esophageal reflux disease without esophagitis: Secondary | ICD-10-CM | POA: Diagnosis not present

## 2022-03-04 DIAGNOSIS — Z8679 Personal history of other diseases of the circulatory system: Secondary | ICD-10-CM | POA: Diagnosis not present

## 2022-03-04 DIAGNOSIS — E1169 Type 2 diabetes mellitus with other specified complication: Secondary | ICD-10-CM

## 2022-03-04 DIAGNOSIS — E785 Hyperlipidemia, unspecified: Secondary | ICD-10-CM | POA: Diagnosis not present

## 2022-03-04 DIAGNOSIS — I10 Essential (primary) hypertension: Secondary | ICD-10-CM | POA: Diagnosis not present

## 2022-03-04 MED ORDER — AMLODIPINE BESYLATE 5 MG PO TABS: 5.0000 mg | ORAL_TABLET | Freq: Every day | ORAL | 3 refills | Status: DC

## 2022-03-04 MED ORDER — METFORMIN HCL 500 MG PO TABS: 1000.0000 mg | ORAL_TABLET | Freq: Two times a day (BID) | ORAL | 1 refills | Status: DC

## 2022-03-04 MED ORDER — OMEPRAZOLE 20 MG PO CPDR: 20.0000 mg | DELAYED_RELEASE_CAPSULE | Freq: Every day | ORAL | 1 refills | Status: DC

## 2022-03-04 MED ORDER — LOSARTAN POTASSIUM 100 MG PO TABS: 100.0000 mg | ORAL_TABLET | Freq: Every day | ORAL | 1 refills | Status: DC

## 2022-03-04 NOTE — Progress Notes (Signed)
Subjective:  Patient ID: Jason [name], male    DOB: 04/18/1936  Age: 86 y.o. MRN: [id number]  CC:  Chief Complaint  Patient presents with   Hypertension    Follow up from rehab fluctuating bp, fatigue    Gastroesophageal Reflux   Diabetes    HPI Jason [name] presents for   Hypertension: With hypotension, orthostasis.  I was recently contacted by his physical therapist 2 days ago regarding some orthostatic blood pressures during his treatment.  Per review of PT note, blood pressure 110/70 with a manual cuff sitting, 80/60 standing on July 18.  Repeat blood pressure sitting 130/80. Orthostatic blood pressures today 142/76 supine, sitting 136/70, standing 130/72, standing 134/72 with pulse  80, 78, 86, 83. Medication adjustment June 9 with cardiology, metoprolol decreased to 25 mg twice daily, start amlodipine 5 mg daily. Notes he did skip breakfast day of PT. Not typical.  Home readings:146/80 yesterday.  BP Readings from Last 3 Encounters:  03/04/22 138/68  02/22/22 130/66  01/22/22 (!) 152/78   Lab Results  Component Value Date   CREATININE 1.40 09/09/2021    Diabetes: With obesity, hyperglycemia.  Currently under care of physical therapy for unsteadiness/instability, referred by cardiology.  Question of clonus at recent PT eval.  No leq weakness. No falls.  Question of accuracy of home meter previously.  Has been treated with metformin, Amaryl, Actos.  He is on statin and ARB.  Had previously stopped metformin temporarily due to concerns on what he had read about that medication, but restarted, and then at last visit June 2 adjusted his dosing from 4 pills in the morning to 2 pills twice per day, 500 mg each.  Option to decreased glimepiride low home readings. Taking metformin '1000mg'$  BID, 1/2 glimepiride in the morning ('2mg'$  dose). Actos '15mg'$  qd.  Home readings: Fasting 100-130, usually low 100's. no further lows on lower dose glimepiride. Highest 150.  Microalbumin 42 on 09/09/2021  with normal ratio. Lab Results  Component Value Date   HGBA1C 7.5 (A) 12/17/2021   HGBA1C 7.5 (H) 09/09/2021   Lab Results  Component Value Date   MICROALBUR 42.4 (H) 09/09/2021   LDLCALC 72 09/09/2021   CREATININE 1.40 09/09/2021   GERD Gerd stable with daily omeprazole. No breakthrough sx's.    History 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   Gastroesophageal reflux disease 02/22/2022   Gout 02/22/2022   Benign paroxysmal positional vertigo 02/22/2022   CAD in native artery 01/22/2022   Ascending aortic aneurysm ([place]) 01/22/2022   Gait instability 01/22/2022   Essential hypertension 01/17/2022   Type 2 diabetes mellitus with obesity ([place]) 01/17/2022   HLD (hyperlipidemia) 01/17/2022   Obstructive sleep apnea 01/17/2022   Sensorineural hearing loss (SNHL), bilateral 11/09/2021   Orthostatic hypotension 11/09/2021   Imbalance 11/09/2021   Overweight 02/21/2020   First degree heart block 02/21/2020   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   Vitamin D deficiency 05/31/2016   History of malignant neoplasm of prostate 05/31/2016   History of malignant neoplasm of skin 05/31/2016   Bilateral hearing loss 05/31/2016   Anxiety state 05/31/2016   Bronchospasm 09/24/2010   Cough 09/19/2010   Acute bronchitis 09/19/2010   Allergic rhinitis due to pollen 09/19/2010   Acute  sinusitis 09/19/2010   Past Medical History:  Diagnosis Date   Ascending aortic aneurysm ([place]) 01/22/2022   CAD in native artery 01/22/2022   Cancer [facility])    Cataract    Diabetes mellitus without complication ([place])    Gait instability 01/22/2022   Hypertension    Sleep apnea    No past surgical history on file. Allergies   Allergen Reactions   Lisinopril Swelling and Cough   Prior to Admission medications   Medication Sig Start Date End Date Taking? Authorizing Provider  allopurinol (ZYLOPRIM) 300 MG tablet Take 1 tablet by mouth daily. 03/05/20  Yes [provider]  amLODipine (NORVASC) 5 MG tablet Take 1 tablet (5 mg total) by mouth daily. 01/22/22  Yes Skeet Latch, MD  aspirin EC 81 MG tablet Take 1 tablet every day by oral route. 02/21/20  Yes [provider]  atorvastatin (LIPITOR) 40 MG tablet TAKE 1 TABLET BY MOUTH EVERY DAY 03/03/22  Yes [name], MD  Cholecalciferol (VITAMIN D3) 1.25 MG (50000 UT) CAPS Take by mouth.   Yes [provider]  Cobalamin Combinations (B-12) [phone] MCG SUBL Take 1 tablet by mouth daily.   Yes [provider]  glimepiride (AMARYL) 4 MG tablet Take 1/2 tablet by mouth once a day 02/22/22  Yes Pavero, Harrell Gave, RPH  indomethacin (INDOCIN) 50 MG capsule    Yes [provider]  losartan (COZAAR) 100 MG tablet TAKE 1 TABLET BY MOUTH EVERY DAY 12/31/21  Yes [name], MD  meclizine (ANTIVERT) 12.5 MG tablet TAKE 1 TABLET (12.5 MG TOTAL) BY MOUTH IN THE MORNING, AT NOON, AND AT BEDTIME. 02/12/22  Yes [name], MD  metFORMIN (GLUCOPHAGE) 500 MG tablet Take 2 tablets (1,000 mg total) by mouth 2 (two) times daily with a meal. 01/15/22  Yes [name], MD  metoprolol tartrate (LOPRESSOR) 25 MG tablet Take 12.5 mg by mouth 2 (two) times daily. 1/2 tablet 2 times daily 02/22/22  Yes Skeet Latch, MD  omeprazole (PRILOSEC) 20 MG capsule Take 1 capsule (20 mg total) by mouth daily. 09/09/21  Yes [name], MD  pioglitazone (ACTOS) 15 MG tablet TAKE 1 TABLET (15 MG TOTAL) BY MOUTH DAILY. 03/03/22  Yes [name], MD  venlafaxine (EFFEXOR) 75 MG tablet Take 1 tablet by mouth daily.   Yes [provider]  Ascorbic Acid (VITAMIN C) 100 MG tablet Take 100 mg by mouth daily. Patient not taking:  Reported on 03/04/2022    [provider]  colchicine 0.6 MG tablet 2 tablets initially, then 1 one hour later; maintain 1 PO Q12hrs prn pain while active flare up Patient not taking: Reported on 02/23/2022    [provider]  erythromycin ophthalmic ointment SMARTSIG:In Eye(s) 02/12/22   [provider]  levalbuterol (XOPENEX HFA) 45 MCG/ACT inhaler INL 2 PFS PO Q 6 H Patient not taking: Reported on 02/23/2022    [provider]  loratadine (CLARITIN) 10 MG tablet TK 1 T PO D Patient not taking: Reported on 02/23/2022    [provider]  scopolamine (TRANSDERM SCOP, 1.5 MG,) 1 MG/3DAYS UNWRAP AND APPLY 1 PATCH TO SKIN AS DIRECTED Patient not taking: Reported on 03/04/2022    [provider]  triamcinolone cream (KENALOG) 0.5 % APP AA BID Patient not taking: Reported on 03/04/2022    [provider]   Social History   Socioeconomic History   Marital status: Married    Spouse name: Not on file  Number of children: Not on file   Years of education: Not on file   Highest education level: Not on file  Occupational History   Not on file  Tobacco Use   Smoking status: Former    Types: Pipe   Smokeless tobacco: Never  Substance and Sexual Activity   Alcohol use: Yes    Alcohol/week: 2.0 standard drinks of alcohol    Types: 2 Glasses of wine per week   Drug use: Never   Sexual activity: Never  Other Topics Concern   Not on file  Social History Narrative   Not on file   Social Determinants of Health   Financial Resource Strain: Low Risk  (12/09/2021)   Overall Financial Resource Strain (CARDIA)    Difficulty of Paying Living Expenses: Not hard at all  Food Insecurity: No Food Insecurity (12/09/2021)   Hunger Vital Sign    Worried About Running Out of Food in the Last Year: Never true    Ran Out of Food in the Last Year: Never true  Transportation Needs: No Transportation Needs (12/09/2021)   PRAPARE - Civil engineer, contracting (Medical): No    Lack of Transportation (Non-Medical): No  Physical Activity: Insufficiently Active (12/09/2021)   Exercise Vital Sign    Days of Exercise per Week: 2 days    Minutes of Exercise per Session: 30 min  Stress: No Stress Concern Present (12/09/2021)   [place]    Feeling of Stress : Not at all  Social Connections: Moderately Integrated (12/09/2021)   Social Connection and Isolation Panel [NHANES]    Frequency of Communication with Friends and Family: Three times a week    Frequency of Social Gatherings with Friends and Family: Three times a week    Attends Religious Services: Never    Active Member of Clubs or Organizations: Yes    Attends Archivist Meetings: More than 4 times per year    Marital Status: Married  Human resources officer Violence: Not At Risk (12/09/2021)   Humiliation, Afraid, Rape, and Kick questionnaire    Fear of Current or Ex-Partner: No    Emotionally Abused: No    Physically Abused: No    Sexually Abused: No    Review of Systems  Constitutional:  Negative for fatigue and unexpected weight change.  Eyes:  Negative for visual disturbance.  Respiratory:  Negative for cough, chest tightness and shortness of breath.   Cardiovascular:  Negative for chest pain, palpitations and leg swelling.  Gastrointestinal:  Negative for abdominal pain and blood in stool.  Neurological:  Negative for dizziness, light-headedness and headaches.     Objective:   Vitals:   03/04/22 0908  BP: 138/68  Pulse: 82  Resp: 20  Temp: 97.6 F (36.4 C)  TempSrc: Oral  SpO2: 97%  Weight: 271 lb 6.4 oz (123.1 kg)  Height: '6\' 1"'$  (1.854 m)     Physical Exam Vitals reviewed.  Constitutional:      Appearance: He is well-developed. He is obese.  HENT:     Head: Normocephalic and atraumatic.  Neck:     Vascular: No carotid bruit or JVD.  Cardiovascular:     Rate and Rhythm:  Normal rate and regular rhythm.     Heart sounds: Normal heart sounds. No murmur heard. Pulmonary:     Effort: Pulmonary effort is normal.     Breath sounds: Normal breath sounds. No rales.  Musculoskeletal:  Right lower leg: No edema.     Left lower leg: No edema.  Skin:    General: Skin is warm and dry.  Neurological:     Mental Status: He is alert and oriented to person, place, and time.     Comments: Lower extremity reflexes 1+ at patella, Achilles without clonus appreciated on my exam  Psychiatric:        Mood and Affect: Mood normal.        Assessment & Plan:  Beni Turrell is a 86 y.o. male . Essential hypertension - Plan: losartan (COZAAR) 100 MG tablet, amLODipine (NORVASC) 5 MG tablet History of hypotension  -Episode of hypotension/orthostasis at physical therapy likely related to decreased p.o. intake that day.  Blood pressure stable currently including orthostats in office.  No medication changes for now.  RTC precautions given if repeat low blood pressure and importance of regular meals, fluids discussed.  Type 2 diabetes mellitus with obesity ([place]) - Plan: metFORMIN (GLUCOPHAGE) 500 MG tablet, Hemoglobin A1c  -Improved home readings without recent lows.  Plan for A1c at lab only visit in the next few weeks and medication adjustment accordingly.  Hyperlipidemia, unspecified hyperlipidemia type - Plan: Comprehensive metabolic panel, Lipid panel Tolerating statin, check labs next few weeks.  Continue same, adjust medications accordingly based on results  Gastroesophageal reflux disease, unspecified whether esophagitis present - Plan: omeprazole (PRILOSEC) 20 MG capsule Stable with current dose Prilosec.  Continue same.  Continue physical therapy for unsteadiness as ordered by cardiology.  RTC precautions if persistent symptoms or not improving with PT.   Meds ordered this encounter  Medications   losartan (COZAAR) 100 MG tablet    Sig: Take 1 tablet (100 mg total)  by mouth daily.    Dispense:  90 tablet    Refill:  1   omeprazole (PRILOSEC) 20 MG capsule    Sig: Take 1 capsule (20 mg total) by mouth daily.    Dispense:  90 capsule    Refill:  1   metFORMIN (GLUCOPHAGE) 500 MG tablet    Sig: Take 2 tablets (1,000 mg total) by mouth 2 (two) times daily with a meal.    Dispense:  180 tablet    Refill:  1   amLODipine (NORVASC) 5 MG tablet    Sig: Take 1 tablet (5 mg total) by mouth daily.    Dispense:  90 tablet    Refill:  3   Patient Instructions  Make sure you are drinking fluids and sufficient meals during the day. Symptoms at PT may have been due to skipped meal. If low pressures again, follow up to look at other causes.  Return to the clinic or go to the nearest emergency room if any of your symptoms worsen or new symptoms occur.  Fasting lab visit after August 4.  Follow-up with me in 3 months.  If any medication changes needed in your labs I will let you know.  Return to the clinic or go to the nearest emergency room if any of your symptoms worsen or new symptoms occur.      Signed,   Merri Ray, MD [place] 03/04/22 10:02 AM

## 2022-03-04 NOTE — Patient Instructions (Addendum)
Make sure you are drinking fluids and sufficient meals during the day. Symptoms at PT may have been due to skipped meal. If low pressures again, follow up to look at other causes.  Return to the clinic or go to the nearest emergency room if any of your symptoms worsen or new symptoms occur.  Fasting lab visit after August 4.  Follow-up with me in 3 months.  If any medication changes needed in your labs I will let you know.  Return to the clinic or go to the nearest emergency room if any of your symptoms worsen or new symptoms occur.

## 2022-03-09 ENCOUNTER — Ambulatory Visit (HOSPITAL_BASED_OUTPATIENT_CLINIC_OR_DEPARTMENT_OTHER): Payer: Medicare HMO | Admitting: Physical Therapy

## 2022-03-09 ENCOUNTER — Encounter (HOSPITAL_BASED_OUTPATIENT_CLINIC_OR_DEPARTMENT_OTHER): Payer: Self-pay | Admitting: Physical Therapy

## 2022-03-09 DIAGNOSIS — R42 Dizziness and giddiness: Secondary | ICD-10-CM

## 2022-03-09 DIAGNOSIS — R262 Difficulty in walking, not elsewhere classified: Secondary | ICD-10-CM | POA: Diagnosis not present

## 2022-03-09 DIAGNOSIS — R2681 Unsteadiness on feet: Secondary | ICD-10-CM

## 2022-03-09 NOTE — Therapy (Signed)
OUTPATIENT PHYSICAL THERAPY LOWER EXTREMITY Treatment    Patient Name: Jason [name] MRN: [id number] DOB:1936-01-10, 86 y.o., male Today's Date: 03/09/2022   PT End of Session - 03/09/22 1450     Visit Number 4    Number of Visits 13    Date for PT Re-Evaluation 04/07/22    PT Start Time 1432    PT Stop Time 1514    PT Time Calculation (min) 42 min    Activity Tolerance Patient tolerated treatment well    Behavior During Therapy [facility] for tasks assessed/performed                Past Medical History:  Diagnosis Date   Ascending aortic aneurysm ([place]) 01/22/2022   CAD in native artery 01/22/2022   Cancer [facility])    Cataract    Diabetes mellitus without complication ([place])    Gait instability 01/22/2022   Hypertension    Sleep apnea    History reviewed. No pertinent surgical history. 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   Gastroesophageal reflux disease 02/22/2022   Gout 02/22/2022   Benign paroxysmal positional vertigo 02/22/2022   CAD in native artery 01/22/2022   Ascending aortic aneurysm ([place]) 01/22/2022   Gait instability 01/22/2022   Essential hypertension 01/17/2022   Type 2 diabetes mellitus with obesity ([place]) 01/17/2022   HLD (hyperlipidemia) 01/17/2022   Obstructive sleep apnea 01/17/2022   Sensorineural hearing loss (SNHL), bilateral 11/09/2021   Orthostatic hypotension 11/09/2021   Imbalance 11/09/2021   Overweight 02/21/2020   First degree heart block 02/21/2020   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   Vitamin D deficiency 05/31/2016   History of malignant neoplasm of prostate 05/31/2016   History of malignant neoplasm of skin 05/31/2016   Bilateral hearing loss 05/31/2016    Anxiety state 05/31/2016   Bronchospasm 09/24/2010   Cough 09/19/2010   Acute bronchitis 09/19/2010   Allergic rhinitis due to pollen 09/19/2010   Acute sinusitis 09/19/2010    PCP: [name], MD  REFERRING PROVIDER: [name], MD  REFERRING DIAG: R26.81 (ICD-10-CM) - Gait instability  THERAPY DIAG:  Unsteadiness on feet  Difficulty in walking, not elsewhere classified  Dizziness and giddiness  Rationale for Evaluation and Treatment Rehabilitation  ONSET DATE: 1 year ago  SUBJECTIVE:   SUBJECTIVE STATEMENT: Thank you for getting ahold of my doctor, he worked me up and felt my symptoms last session were from not eating breakfast/low intake and told me to be careful with this. On Saturday I was going to get a haircut and stepping in a low area and fell, hit a guidewire and some ladies had to help me get up. Yesterday, I was going out to my car and saw a lizard and I fell right on my face/nose trying to drop a bag of charcoal on top of it. My eyesight and equilibrium are screwed up right now.   PERTINENT HISTORY: CAD, ascending aortic aneurysm, T2DM, HTN, sleep apnea, orthostatic hypotension, gout  PAIN:  Are you having pain? No and Yes: NPRS scale: 2/10 Pain location: nose  Pain description: sore Aggravating factors: nothing Relieving factors: nothing    PRECAUTIONS: None  WEIGHT BEARING RESTRICTIONS No  FALLS:  Has patient fallen in last 6 months? Yes.  Number of falls 3  LIVING ENVIRONMENT: Lives with: lives with their spouse Lives in: House/apartment Stairs: No Has following equipment at home: Single point cane  OCCUPATION: Retired  PLOF: Independent  PATIENT GOALS  Improve walking, fatigue   7/25  At rest  HR/SpO2 WNL on RA 125/87 BPM sitting; 103/71 after standing for 3 minutes asymptomatic   Nustep L5 x6 minutes BLEs only   Sit to stands 8# 1x15 Tandem stance 3x30 seconds B  Step ups on 4 inch box forward 1x10 B U UE support on  counter Min-Mod A for balance intermittently Narrow BOS blue foam pad 3x30 seconds EO               Backwards walking 2x36f min guard     OBJECTIVE:    VITALS 7/14  HR 77     SPO2 97 BP 159/82  After 6 min walk 160/88 HR 78  Sao2 97      DIAGNOSTIC FINDINGS:   Chest x-ray, 01/18/22 IMPRESSION: No acute cardiopulmonary findings. Stable left basilar scarring changes.  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  POSTURE: rounded shoulders and increased lumbar lordosis   LOWER EXTREMITY ROM:  Active ROM Right eval Left eval  Hip flexion    Hip extension    Hip abduction    Hip adduction    Hip internal rotation    Hip external rotation    Knee flexion    Knee extension WNL WNL  Ankle dorsiflexion WNL WNL  Ankle plantarflexion    Ankle inversion    Ankle eversion     (Blank rows = not tested)  LOWER EXTREMITY MMT:  MMT Right eval Left eval  Hip flexion 20.7  20.9  Hip extension    Hip abduction 49.6 43.6  Hip adduction    Hip internal rotation    Hip external rotation    Knee flexion 43.3 39.2  Knee extension 41.8 36.2  Ankle dorsiflexion 48.4 46.8  Ankle plantarflexion    Ankle inversion    Ankle eversion     (Blank rows = not tested)    Grip strength: R 70, L 55   GAIT:  Patient ambulates with bil Trendelenburg pattern, decreased stride length and stance time, L>R. He has moderate difficulty turning in place.   TODAY'S TREATMENT: 7/17  Berg balance testing: see flow sheet score 41/57  6 min walk test: 815 ft with 1 40 sec seated rest break. No major change in HR or B/P   Eval Seated marches, LAQ, ankle pumps, bicep curls - given instructions to perform at EOB or seated before standing to increase BP.  PATIENT EDUCATION:  Education details: recommend use of RW  to reduce fall risk until we can improve functional status in therapy  Person educated: Patient Education method: Explanation Education comprehension: verbalized understanding   HOME EXERCISE PROGRAM: Access Code: [email or web address] URL: [email or web address] Date: 02/24/2022 Prepared by: DCarolyne Littles BERG Balance Test          Date:   Sit to Stand   Standing unsupported   Sitting with back unsupported but feet supported   Stand to sit    Transfers    Standing unsupported with eyes closed   Standing unsupported feet together   From standing position, reach forward with outstretched arm   From  standing position, pick up object from floor   From standing position, turn and look behind over each shoulder   Turn 360   Standing unsupported, alternately place foot on step   Standing unsupported, one foot in front   Standing on one leg   Total:       ASSESSMENT:  CLINICAL IMPRESSION:    Michial arrives today doing OK, had a lot of falls over the weekend unfortunately. Took BPs first (see above), monitored PRN during session today. Able to work a bit more on balance, functional activity tolerance, and general strengthening today but easily fatigued and needed a lot of rest breaks. Did discuss potential use of RW to help reduce falls since this has been happening quite frequently recently. Will continue to progress as able and tolerated.   OBJECTIVE IMPAIRMENTS cardiopulmonary status limiting activity, decreased balance, difficulty walking, decreased ROM, decreased strength, and impaired sensation.   ACTIVITY LIMITATIONS sitting, standing, and locomotion level  PARTICIPATION LIMITATIONS: community activity and exercise routine  PERSONAL FACTORS Age, Fitness, and Time since onset of injury/illness/exacerbation are also affecting patient's functional outcome.   REHAB POTENTIAL: Good  CLINICAL DECISION MAKING: Evolving/moderate complexity  EVALUATION COMPLEXITY:  Moderate   GOALS: Goals reviewed with patient? Yes  SHORT TERM GOALS: Target date:  03/16/2022 Patient will improve bil LE strength by 20%. Baseline: Goal status: INITIAL  2.  Patient will be able to stand from normal height chair without use of UE or reported symptoms of dizziness. Baseline:  Goal status: INITIAL  3.  Patient will increase BERG score by 4 points  Baseline:  Goal status: INITIAL   4.  Patient will increase 6 min walk test by 120'  Baseline:  Goal status: INITIAL     LONG TERM GOALS: Target date: 04/06/2022 Patient will be able to walk to and from mailbox without fatigue or loss of balance. Baseline:  Goal status: INITIAL  2.  Patient will be independent with exercise routine to maintain and improve level of fitness. Baseline:  Goal status: INITIAL  3.  Patient's BERG score will be >52 in order to demonstrate a decreased fall risk Baseline:  Goal status: INITIAL    Additional short and long term goals to be made based on scoring on BERG and 6MWT assessments to be performed at next visit.   PLAN: PT FREQUENCY: 1-2x/week  PT DURATION: 6 weeks  PLANNED INTERVENTIONS: Therapeutic exercises, Therapeutic activity, Neuromuscular re-education, Balance training, Gait training, Patient/Family education, Joint mobilization, Aquatic Therapy, Dry Needling, Cryotherapy, Moist heat, and Ionotophoresis '4mg'$ /ml Dexamethasone  PLAN FOR NEXT SESSION: BERG, 6MWT consider light interval training 1 1/2 to 2 min intervals while monitoring the patients HR and potential syncope; consider static balance training although the patient had more trouble with dynamic movements on the BERG; consider an UE or LE seated series for his HEP. F/u on getting a RW to reduce fall risk    Antolin Belsito U PT DPT PN2  03/09/2022, 3:25 PM

## 2022-03-16 ENCOUNTER — Encounter (HOSPITAL_BASED_OUTPATIENT_CLINIC_OR_DEPARTMENT_OTHER): Payer: Self-pay | Admitting: Physical Therapy

## 2022-03-16 ENCOUNTER — Ambulatory Visit (HOSPITAL_BASED_OUTPATIENT_CLINIC_OR_DEPARTMENT_OTHER): Payer: Medicare HMO | Attending: Cardiovascular Disease | Admitting: Physical Therapy

## 2022-03-16 DIAGNOSIS — R2681 Unsteadiness on feet: Secondary | ICD-10-CM | POA: Insufficient documentation

## 2022-03-16 DIAGNOSIS — R42 Dizziness and giddiness: Secondary | ICD-10-CM | POA: Diagnosis not present

## 2022-03-16 DIAGNOSIS — R262 Difficulty in walking, not elsewhere classified: Secondary | ICD-10-CM | POA: Diagnosis not present

## 2022-03-16 NOTE — Therapy (Addendum)
OUTPATIENT PHYSICAL THERAPY LOWER EXTREMITY Treatment/discharge   Patient Name: Jason [name] MRN: [id number] DOB:[birth year], 86 y.o., male Today's Date: 03/16/2022   PT End of Session - 03/16/22 0926     Visit Number 5    Number of Visits 13    Date for PT Re-Evaluation 04/07/22    PT Start Time 0845    PT Stop Time 0920   could only tolerate 35 min   PT Time Calculation (min) 35 min    Activity Tolerance Patient tolerated treatment well    Behavior During Therapy [facility] for tasks assessed/performed                 Past Medical History:  Diagnosis Date   Ascending aortic aneurysm ([place]) 01/22/2022   CAD in native artery 01/22/2022   Cancer [facility])    Cataract    Diabetes mellitus without complication ([place])    Gait instability 01/22/2022   Hypertension    Sleep apnea    History reviewed. No pertinent surgical history. 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   Gastroesophageal reflux disease 02/22/2022   Gout 02/22/2022   Benign paroxysmal positional vertigo 02/22/2022   CAD in native artery 01/22/2022   Ascending aortic aneurysm ([place]) 01/22/2022   Gait instability 01/22/2022   Essential hypertension 01/17/2022   Type 2 diabetes mellitus with obesity ([place]) 01/17/2022   HLD (hyperlipidemia) 01/17/2022   Obstructive sleep apnea 01/17/2022   Sensorineural hearing loss (SNHL), bilateral 11/09/2021   Orthostatic hypotension 11/09/2021   Imbalance 11/09/2021   Overweight 02/21/2020   First degree heart block 02/21/2020   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   Vitamin D deficiency 05/31/2016   History of malignant neoplasm of prostate 05/31/2016   History of malignant neoplasm of skin 05/31/2016    Bilateral hearing loss 05/31/2016   Anxiety state 05/31/2016   Bronchospasm 09/24/2010   Cough 09/19/2010   Acute bronchitis 09/19/2010   Allergic rhinitis due to pollen 09/19/2010   Acute sinusitis 09/19/2010    PCP: [name], MD  REFERRING PROVIDER: [name], MD  REFERRING DIAG: R26.81 (ICD-10-CM) - Gait instability  THERAPY DIAG:  Unsteadiness on feet  Difficulty in walking, not elsewhere classified  Dizziness and giddiness  Rationale for Evaluation and Treatment Rehabilitation  ONSET DATE: 1 year ago  SUBJECTIVE:   SUBJECTIVE STATEMENT: The patient comes in today again feeling syncopal. He  PERTINENT HISTORY: CAD, ascending aortic aneurysm, T2DM, HTN, sleep apnea, orthostatic hypotension, gout  PAIN:  Are you having pain? No 8/1 Pain location: nose  Pain description: sore Aggravating factors: nothing Relieving factors: nothing   PRECAUTIONS: None  WEIGHT BEARING RESTRICTIONS No  FALLS:  Has patient fallen in last 6 months? Yes. Number of falls 3  LIVING ENVIRONMENT: Lives with: lives with their spouse Lives in: House/apartment Stairs: No Has following equipment at home: Single point cane  OCCUPATION: Retired  PLOF: Independent  PATIENT GOALS  Improve walking, fatigue      OBJECTIVE:    VITALS 7/14  HR 77     SPO2 97 BP 159/82  After 6 min walk 160/88 HR 78  Sao2 97      DIAGNOSTIC FINDINGS:   Chest x-ray, 01/18/22 IMPRESSION: No acute cardiopulmonary findings. Stable left  basilar scarring changes.  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  POSTURE: rounded shoulders and increased lumbar lordosis   LOWER EXTREMITY ROM:  Active ROM Right eval Left eval  Hip flexion    Hip extension    Hip abduction    Hip adduction    Hip  internal rotation    Hip external rotation    Knee flexion    Knee extension WNL WNL  Ankle dorsiflexion WNL WNL  Ankle plantarflexion    Ankle inversion    Ankle eversion     (Blank rows = not tested)  LOWER EXTREMITY MMT:  MMT Right eval Left eval  Hip flexion 20.7  20.9  Hip extension    Hip abduction 49.6 43.6  Hip adduction    Hip internal rotation    Hip external rotation    Knee flexion 43.3 39.2  Knee extension 41.8 36.2  Ankle dorsiflexion 48.4 46.8  Ankle plantarflexion    Ankle inversion    Ankle eversion     (Blank rows = not tested)    Grip strength: R 70, L 55   GAIT:  Patient ambulates with bil Trendelenburg pattern, decreased stride length and stance time, L>R. He has moderate difficulty turning in place.   TODAY'S TREATMENT: 8/1 Seated  144/87  Standing 97/66  Seated exercises:  Hip abduction green 2x15  Seated March 2x15 red   Bilateral ER 2x15 red  Bilateral horizontal abduction 2x15  red  Shoulder flexion red 2x10   Re-assessed orthosttics   Seated 137/87  Standing 88/66       7/25  At rest  HR/SpO2 WNL on RA 125/87 BPM sitting; 103/71 after standing for 3 minutes asymptomatic   Nustep L5 x6 minutes BLEs only   Sit to stands 8# 1x15 Tandem stance 3x30 seconds B  Step ups on 4 inch box forward 1x10 B U UE support on counter Min-Mod A for balance intermittently Narrow BOS blue foam pad 3x30 seconds EO               Backwards walking 2x68ft min guard    7/17  Berg balance testing: see flow sheet score 41/57  6 min walk test: 815 ft with 1 40 sec seated rest break. No major change in HR or B/P   Eval Seated marches, LAQ, ankle pumps, bicep curls - given instructions to perform at EOB or seated before standing to increase BP.  PATIENT EDUCATION:  Education details: recommend use of RW to reduce fall risk until we can improve functional status in therapy  Person educated: Patient Education method:  Explanation Education comprehension: verbalized understanding   HOME EXERCISE PROGRAM: Access Code: [email or web address] URL: [email or web address] Date: 02/24/2022 Prepared by: Carolyne Littles  BERG Balance Test          Date:   Sit to Stand   Standing unsupported   Sitting with back unsupported but feet supported   Stand to sit    Transfers    Standing unsupported with eyes closed   Standing unsupported feet together   From standing position, reach forward with outstretched arm   From standing position, pick up object from floor   From standing position, turn and look behind over each shoulder   Turn 360   Standing unsupported, alternately place foot on step  Standing unsupported, one foot in front   Standing on one leg   Total:       ASSESSMENT:  CLINICAL IMPRESSION:    The patient continues to have significant fluctuations in his blood pressure particularly when he comes in symptomatic like he did today. He reports he did not eat much again. He was advised next visit to eat before he comes in. He did have a granola bar and a V8. Therapy assessed his blood pressure which fluctuated significantly. He then performed exercises with the hope that it may improve with activity. It remained significantly ortho static. No balance exercises performed today 2nd to the patient being syncopal prior to activity. He will see his MD for lab work on Thursday.   OBJECTIVE IMPAIRMENTS cardiopulmonary status limiting activity, decreased balance, difficulty walking, decreased ROM, decreased strength, and impaired sensation.   ACTIVITY LIMITATIONS sitting, standing, and locomotion level  PARTICIPATION LIMITATIONS: community activity and exercise routine  PERSONAL FACTORS Age, Fitness, and Time since onset of injury/illness/exacerbation are also affecting patient's functional outcome.   REHAB POTENTIAL: Good  CLINICAL DECISION MAKING: Evolving/moderate complexity  EVALUATION COMPLEXITY:  Moderate   GOALS: Goals reviewed with patient? Yes  SHORT TERM GOALS: Target date:  03/16/2022 Patient will improve bil LE strength by 20%. Baseline: Goal status: INITIAL  2.  Patient will be able to stand from normal height chair without use of UE or reported symptoms of dizziness. Baseline:  Goal status: INITIAL  3.  Patient will increase BERG score by 4 points  Baseline:  Goal status: INITIAL   4.  Patient will increase 6 min walk test by 120'  Baseline:  Goal status: INITIAL     LONG TERM GOALS: Target date: 04/06/2022 Patient will be able to walk to and from mailbox without fatigue or loss of balance. Baseline:  Goal status: INITIAL  2.  Patient will be independent with exercise routine to maintain and improve level of fitness. Baseline:  Goal status: INITIAL  3.  Patient's BERG score will be >52 in order to demonstrate a decreased fall risk Baseline:  Goal status: INITIAL    Additional short and long term goals to be made based on scoring on BERG and 6MWT assessments to be performed at next visit.   PLAN: PT FREQUENCY: 1-2x/week  PT DURATION: 6 weeks  PLANNED INTERVENTIONS: Therapeutic exercises, Therapeutic activity, Neuromuscular re-education, Balance training, Gait training, Patient/Family education, Joint mobilization, Aquatic Therapy, Dry Needling, Cryotherapy, Moist heat, and Ionotophoresis 4mg /ml Dexamethasone  PLAN FOR NEXT SESSION: BERG, 6MWT consider light interval training 1 1/2 to 2 min intervals while monitoring the patients HR and potential syncope; consider static balance training although the patient had more trouble with dynamic movements on the BERG; consider an UE or LE seated series for his HEP. F/u on getting a RW to reduce fall risk   PHYSICAL THERAPY DISCHARGE SUMMARY  Visits from Start of Care: 5  Current functional level related to goals / functional outcomes:  Patient did not return for last visit    Remaining deficits: Unknown    Education / Equipment: HEP    Patient agrees to discharge. Patient goals were not met. Patient is being discharged due to not returning since the last visit.   Carolyne Littles PT DPT  03/16/2022, 9:27 AM

## 2022-03-17 ENCOUNTER — Ambulatory Visit: Payer: Medicare HMO | Admitting: Family Medicine

## 2022-03-18 ENCOUNTER — Other Ambulatory Visit (INDEPENDENT_AMBULATORY_CARE_PROVIDER_SITE_OTHER): Payer: Medicare HMO

## 2022-03-18 DIAGNOSIS — E669 Obesity, unspecified: Secondary | ICD-10-CM | POA: Diagnosis not present

## 2022-03-18 DIAGNOSIS — E785 Hyperlipidemia, unspecified: Secondary | ICD-10-CM

## 2022-03-18 DIAGNOSIS — E1169 Type 2 diabetes mellitus with other specified complication: Secondary | ICD-10-CM

## 2022-03-18 LAB — COMPREHENSIVE METABOLIC PANEL
ALT: 16 U/L (ref 0–53)
AST: 17 U/L (ref 0–37)
Albumin: 4.1 g/dL (ref 3.5–5.2)
Alkaline Phosphatase: 75 U/L (ref 39–117)
BUN: 24 mg/dL — ABNORMAL HIGH (ref 6–23)
CO2: 24 mEq/L (ref 19–32)
Calcium: 9.4 mg/dL (ref 8.4–10.5)
Chloride: 101 mEq/L (ref 96–112)
Creatinine, Ser: 1.25 mg/dL (ref 0.40–1.50)
GFR: 52.98 mL/min — ABNORMAL LOW (ref >60.00)
Glucose, Bld: 141 mg/dL — ABNORMAL HIGH (ref 70–99)
Potassium: 4.6 mEq/L (ref 3.5–5.1)
Sodium: 135 mEq/L (ref 135–145)
Total Bilirubin: 0.4 mg/dL (ref 0.2–1.2)
Total Protein: 7.1 g/dL (ref 6.0–8.3)

## 2022-03-18 LAB — HEMOGLOBIN A1C: Hgb A1c MFr Bld: 7.3 % — ABNORMAL HIGH (ref 4.6–6.5)

## 2022-03-18 LAB — LIPID PANEL
Cholesterol: 144 mg/dL (ref 0–200)
HDL: 42.5 mg/dL (ref >39.00)
LDL Cholesterol: 71 mg/dL (ref 0–99)
NonHDL: 101.59
Total CHOL/HDL Ratio: 3
Triglycerides: 153 mg/dL — ABNORMAL HIGH (ref 0.0–149.0)
VLDL: 30.6 mg/dL (ref 0.0–40.0)

## 2022-03-19 ENCOUNTER — Ambulatory Visit (HOSPITAL_BASED_OUTPATIENT_CLINIC_OR_DEPARTMENT_OTHER): Payer: Medicare HMO | Admitting: Physical Therapy

## 2022-03-19 ENCOUNTER — Encounter (HOSPITAL_BASED_OUTPATIENT_CLINIC_OR_DEPARTMENT_OTHER): Payer: Self-pay

## 2022-03-25 ENCOUNTER — Encounter: Payer: Self-pay | Admitting: Pharmacist Clinician (PhC)/ Clinical Pharmacy Specialist

## 2022-03-25 ENCOUNTER — Ambulatory Visit: Payer: Medicare HMO | Admitting: Pharmacist Clinician (PhC)/ Clinical Pharmacy Specialist

## 2022-03-25 DIAGNOSIS — I10 Essential (primary) hypertension: Secondary | ICD-10-CM | POA: Diagnosis not present

## 2022-03-25 NOTE — Progress Notes (Signed)
03/25/2022 Khush Pasion Apr 24, 1936 [id number]   HPI:  Jason [name] is a 86 y.o. male patient of Dr Oval Linsey, with a [place] below who presents today for hypertension clinic evaluation.  He was first referred to cardiology in June for symptoms of fatigue.  Patient reported that the [place] had recently increased metoprolol to 50 mg bid, but felt this was making him more unsteady on his feet.  She decreased it back to 25 mg bid and added amlodipine 5 mg daily.   She suggested to start with goal of < 140/90, then down to <130/80 if he tolerates the change.  He was then seen by Karren Cobble PharmD last month and pressure in office had improved to 130/66.  At that time he was still reporting occasional dizziness and the metoprolol was further decreased to 12.5 mg bid  Today he returns for follow up.  He was working with PT and they checked orthostatic BP readings earlier this week.  Discovered his pressure drops around 40 points systolic within a minute of standing.  Lowest orthostatic reading was 96/69.  He does feel some unsteadiness with this.     Past Medical History: CAD CAC score 340 (39th percentile)  OSA Uses CPAP regularly  hyperlipidemia 8/23 LDL 71 on   DM2 8/23 A1c 7.3, down from 7.5 3 months ago;      Blood Pressure Goal:  130/80  Current Medications: losartan 100 mg qd - am, amlodipine 5 mg qd - am, metoprolol tart 12.5 mg bid  Social Hx: no tobacco,  occasional alcohol - maybe wine 1-2 times per week; coffee 1 cup per day  Diet: wife on [employer] - he eats whatever she gives him  Exercise: equillibruim problems, not able to exercise much  Home BP readings:   146/81 sitting 108/71 standing 135/82 sitting  96/59 standing 135/85 sitting  96/69 standing  Intolerances: lisinopril - swelling, cough  Labs: 8/23: Na 135, K 4.6, Glu 141, BUN 24, SCr 1.25, GFR 52.98   Wt Readings from Last 3 Encounters:  03/04/22 271 lb 6.4 oz (123.1 kg)  02/22/22 278 lb 6.4 oz (126.3 kg)   01/22/22 271 lb 12.8 oz (123.3 kg)   BP Readings from Last 3 Encounters:  03/25/22 118/78  03/04/22 138/68  02/22/22 130/66   Pulse Readings from Last 3 Encounters:  03/25/22 77  03/04/22 82  02/22/22 68    Current Outpatient Medications  Medication Sig Dispense Refill   allopurinol (ZYLOPRIM) 300 MG tablet Take 1 tablet by mouth daily.     amLODipine (NORVASC) 5 MG tablet Take 1 tablet (5 mg total) by mouth daily. 90 tablet 3   Ascorbic Acid (VITAMIN C) 100 MG tablet Take 100 mg by mouth daily.     aspirin EC 81 MG tablet Take 1 tablet every day by oral route.     atorvastatin (LIPITOR) 40 MG tablet TAKE 1 TABLET BY MOUTH EVERY DAY 90 tablet 1   glimepiride (AMARYL) 4 MG tablet Take 1/2 tablet by mouth once a day 180 tablet 1   losartan (COZAAR) 100 MG tablet Take 1 tablet (100 mg total) by mouth daily. 90 tablet 1   meclizine (ANTIVERT) 12.5 MG tablet TAKE 1 TABLET (12.5 MG TOTAL) BY MOUTH IN THE MORNING, AT NOON, AND AT BEDTIME. 90 tablet 1   metFORMIN (GLUCOPHAGE) 500 MG tablet Take 2 tablets (1,000 mg total) by mouth 2 (two) times daily with a meal. 180 tablet 1   metoprolol tartrate (  LOPRESSOR) 25 MG tablet Take 12.5 mg by mouth 2 (two) times daily. 1/2 tablet 2 times daily 180 tablet 3   omeprazole (PRILOSEC) 20 MG capsule Take 1 capsule (20 mg total) by mouth daily. 90 capsule 1   pioglitazone (ACTOS) 15 MG tablet TAKE 1 TABLET (15 MG TOTAL) BY MOUTH DAILY. 90 tablet 1   venlafaxine (EFFEXOR) 75 MG tablet Take 1 tablet by mouth daily.     No current facility-administered medications for this visit.    Allergies  Allergen Reactions   Lisinopril Swelling and Cough    Past Medical History:  Diagnosis Date   Ascending aortic aneurysm ([place]) 01/22/2022   CAD in native artery 01/22/2022   Cancer [facility])    Cataract    Diabetes mellitus without complication ([place])    Gait instability 01/22/2022   Hypertension    Sleep apnea     Blood pressure 118/78, pulse 77.  Essential  hypertension Patient with orthostatic hypotension superimposed on essential hypertension.  Systolic pressure dropped 41 points from sitting to standing in the office today.  He has noticed similar drops with PT therapist, with systolic dropping to < 283 on several occasions.  He does use a cane for stability, but admits to having a fall last week.  Will have him move amlodipine to evenings and continue losartan in the mornings.  Discussed the challenges of keeping BP from being too high as well as dropping too low when he stands.  No changes to medications today and he will continue to monitor.  Will move amlodipine to evenings, better balance of medications and maybe can keep morning orthostatic readings from dropping below 100.  We will see him back in 3 months for follow up.     Tommy Medal PharmD CPP [facility] [address] [place], [place] [address] [phone]

## 2022-03-25 NOTE — Assessment & Plan Note (Signed)
Patient with orthostatic hypotension superimposed on essential hypertension.  Systolic pressure dropped 41 points from sitting to standing in the office today.  He has noticed similar drops with PT therapist, with systolic dropping to < 539 on several occasions.  He does use a cane for stability, but admits to having a fall last week.  Will have him move amlodipine to evenings and continue losartan in the mornings.  Discussed the challenges of keeping BP from being too high as well as dropping too low when he stands.  No changes to medications today and he will continue to monitor.  Will move amlodipine to evenings, better balance of medications and maybe can keep morning orthostatic readings from dropping below 100.  We will see him back in 3 months for follow up.

## 2022-03-25 NOTE — Patient Instructions (Signed)
Return for a a follow up appointment November 16 at 10:30 am  Check your blood pressure at home a few times each week and keep record of the readings.  Take your BP meds as follows:  Move amlodipine to evenings starting tomorrow.  Continue with all other medications.  Bring all of your meds, your BP cuff and your record of home blood pressures to your next appointment.  Exercise as you're able, try to walk approximately 30 minutes per day.  Keep salt intake to a minimum, especially watch canned and prepared boxed foods.  Eat more fresh fruits and vegetables and fewer canned items.  Avoid eating in fast food restaurants.    HOW TO TAKE YOUR BLOOD PRESSURE: Rest 5 minutes before taking your blood pressure.  Don't smoke or drink caffeinated beverages for at least 30 minutes before. Take your blood pressure before (not after) you eat. Sit comfortably with your back supported and both feet on the floor (don't cross your legs). Elevate your arm to heart level on a table or a desk. Use the proper sized cuff. It should fit smoothly and snugly around your bare upper arm. There should be enough room to slip a fingertip under the cuff. The bottom edge of the cuff should be 1 inch above the crease of the elbow. Ideally, take 3 measurements at one sitting and record the average.

## 2022-03-26 ENCOUNTER — Encounter (HOSPITAL_BASED_OUTPATIENT_CLINIC_OR_DEPARTMENT_OTHER): Payer: Self-pay | Admitting: Physical Therapy

## 2022-03-29 ENCOUNTER — Encounter (HOSPITAL_BASED_OUTPATIENT_CLINIC_OR_DEPARTMENT_OTHER): Payer: Self-pay

## 2022-03-29 ENCOUNTER — Ambulatory Visit (HOSPITAL_BASED_OUTPATIENT_CLINIC_OR_DEPARTMENT_OTHER): Payer: Medicare HMO | Admitting: Physical Therapy

## 2022-03-29 ENCOUNTER — Other Ambulatory Visit: Payer: Self-pay | Admitting: Family Medicine

## 2022-03-31 ENCOUNTER — Ambulatory Visit (HOSPITAL_BASED_OUTPATIENT_CLINIC_OR_DEPARTMENT_OTHER): Payer: Medicare HMO | Admitting: Physical Therapy

## 2022-04-05 ENCOUNTER — Telehealth: Payer: Self-pay

## 2022-04-05 NOTE — Telephone Encounter (Signed)
Patient states he would like call back to discuss BP.

## 2022-04-05 NOTE — Telephone Encounter (Signed)
Pt reports BP has had some lows, 135/85 sitting > 96/69 standing, 101/56 sitting > 85/58 standing, 142/88 sitting > 88/63 standing  3 consecutive days took these readings, also notes a similar incident 2 years ago where he had been dehydrated and had similar feelings and readings, I advised he increase fluid intake and see if that changes how he feels while we wait to hear back directions from provider

## 2022-04-05 NOTE — Telephone Encounter (Signed)
Agree with increased fluids, but needs office visit 8/22 with any provider for BP check and likely labs.

## 2022-04-06 ENCOUNTER — Telehealth: Payer: Self-pay | Admitting: Family Medicine

## 2022-04-06 NOTE — Telephone Encounter (Signed)
error 

## 2022-04-07 ENCOUNTER — Ambulatory Visit (INDEPENDENT_AMBULATORY_CARE_PROVIDER_SITE_OTHER): Payer: Medicare HMO | Admitting: Family Medicine

## 2022-04-07 ENCOUNTER — Encounter: Payer: Self-pay | Admitting: Family Medicine

## 2022-04-07 VITALS — BP 110/72 | HR 60 | Temp 98.0°F | Resp 17 | Ht 73.0 in | Wt 272.0 lb

## 2022-04-07 DIAGNOSIS — I10 Essential (primary) hypertension: Secondary | ICD-10-CM | POA: Diagnosis not present

## 2022-04-07 DIAGNOSIS — Z8679 Personal history of other diseases of the circulatory system: Secondary | ICD-10-CM

## 2022-04-07 NOTE — Progress Notes (Signed)
Subjective:  Patient ID: Jason [name], male    DOB: 1935-10-23  Age: 86 y.o. MRN: [id number]  CC:  Chief Complaint  Patient presents with   Hypertension    Fluctuating  bp from sitting to standing, been going on a couple of months, taking bp meds regularly     HPI Jason [name] presents for    Hypertension: With orthostasis, hypotension.  On last visit in July.  PT had noted drop in BP down to 80/60 with treatment.  Orthostatic blood pressures in the office looked okay.  Thought to have been related to decreased p.o. intake on day of physical therapy.  His metoprolol has been decreased to '25mg'$ , then ultimately 12.5 mg twice daily and started amlodipine by cardiology in June.  Maintenance of hydration discussed.  Recent labs on August 3 with stable renal function. See recent phone message from 2 days ago.  Blood pressures dropped to 96/69 standing, 85/58 standing, 88/63 standing.  Currently taking amlodipine 5 mg daily  - now taking at night, losartan 100 mg daily, metoprolol 25 mg twice daily.  Has been trying to drink more water.  Still dropping lower at times. 142/81 sitting, 86/67 standing this morning. Slight lightheadedness. No syncope. No chest pain.  Highest reading 162/91 earlier today.  Using [employer] BP machine - upper arm.  Has not discussed these readings with cardiologist.   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   Gastroesophageal reflux disease 02/22/2022   Gout 02/22/2022   Benign paroxysmal positional vertigo  02/22/2022   CAD in native artery 01/22/2022   Ascending aortic aneurysm ([place]) 01/22/2022   Gait instability 01/22/2022   Essential hypertension 01/17/2022   Type 2 diabetes mellitus with obesity ([place]) 01/17/2022   HLD (hyperlipidemia) 01/17/2022   Obstructive sleep apnea 01/17/2022   Sensorineural hearing loss (SNHL), bilateral 11/09/2021   Orthostatic hypotension 11/09/2021   Imbalance 11/09/2021   Overweight 02/21/2020   First degree heart block 02/21/2020   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   Vitamin D deficiency 05/31/2016   History of malignant neoplasm of prostate 05/31/2016   History of malignant neoplasm of skin 05/31/2016   Bilateral hearing loss 05/31/2016   Anxiety state 05/31/2016   Bronchospasm 09/24/2010   Cough 09/19/2010   Acute bronchitis 09/19/2010   Allergic rhinitis due to pollen 09/19/2010   Acute sinusitis 09/19/2010   Past Medical History:  Diagnosis Date   Ascending aortic aneurysm ([place]) 01/22/2022   CAD in native artery 01/22/2022   Cancer [facility])    Cataract    Diabetes mellitus without complication ([place])    Gait instability 01/22/2022   Hypertension    Sleep apnea    No past surgical history on file. Allergies  Allergen Reactions   Lisinopril Swelling and Cough   Prior to Admission medications   Medication Sig Start Date End Date Taking? Authorizing Provider  allopurinol (ZYLOPRIM) 300 MG tablet Take 1 tablet by mouth daily. 03/05/20   [provider]  amLODipine (NORVASC) 5 MG tablet Take 1 tablet (5 mg total) by mouth  daily. 03/04/22   [name], MD  Ascorbic Acid (VITAMIN C) 100 MG tablet Take 100 mg by mouth daily.    [provider]  aspirin EC 81 MG tablet Take 1 tablet every day by oral route. 02/21/20   [provider]  atorvastatin (LIPITOR) 40 MG tablet TAKE 1 TABLET BY MOUTH EVERY DAY 03/03/22   [name], MD  glimepiride (AMARYL) 4 MG tablet Take 1/2 tablet by mouth once a day 02/22/22   Pavero,  Harrell Gave, RPH  losartan (COZAAR) 100 MG tablet Take 1 tablet (100 mg total) by mouth daily. 03/04/22   [name], MD  meclizine (ANTIVERT) 12.5 MG tablet TAKE 1 TABLET (12.5 MG TOTAL) BY MOUTH IN THE MORNING, AT NOON, AND AT BEDTIME. 02/12/22   [name], MD  metFORMIN (GLUCOPHAGE) 500 MG tablet Take 2 tablets (1,000 mg total) by mouth 2 (two) times daily with a meal. 03/04/22   [name], MD  metoprolol tartrate (LOPRESSOR) 25 MG tablet Take 12.5 mg by mouth 2 (two) times daily. 1/2 tablet 2 times daily 02/22/22   Skeet Latch, MD  omeprazole (PRILOSEC) 20 MG capsule Take 1 capsule (20 mg total) by mouth daily. 03/04/22   [name], MD  pioglitazone (ACTOS) 15 MG tablet TAKE 1 TABLET (15 MG TOTAL) BY MOUTH DAILY. 03/03/22   [name], MD  venlafaxine (EFFEXOR) 75 MG tablet TAKE 1 TABLET BY MOUTH EVERY DAY 03/29/22   [name], MD   Social History   Socioeconomic History   Marital status: Married    Spouse name: Not on file   Number of children: Not on file   Years of education: Not on file   Highest education level: Not on file  Occupational History   Not on file  Tobacco Use   Smoking status: Former    Types: Pipe   Smokeless tobacco: Never  Substance and Sexual Activity   Alcohol use: Yes    Alcohol/week: 2.0 standard drinks of alcohol    Types: 2 Glasses of wine per week   Drug use: Never   Sexual activity: Never  Other Topics Concern   Not on file  Social History Narrative   Not on file   Social Determinants of Health   Financial Resource Strain: Low Risk  (12/09/2021)   Overall Financial Resource Strain (CARDIA)    Difficulty of Paying Living Expenses: Not hard at all  Food Insecurity: No Food Insecurity (12/09/2021)   Hunger Vital Sign    Worried About Running Out of Food in the Last Year: Never true    Ran Out of Food in the Last Year: Never true  Transportation Needs: No Transportation Needs (12/09/2021)   PRAPARE -  Hydrologist (Medical): No    Lack of Transportation (Non-Medical): No  Physical Activity: Insufficiently Active (12/09/2021)   Exercise Vital Sign    Days of Exercise per Week: 2 days    Minutes of Exercise per Session: 30 min  Stress: No Stress Concern Present (12/09/2021)   [place]    Feeling of Stress : Not at all  Social Connections: Moderately Integrated (12/09/2021)   Social Connection and Isolation Panel [NHANES]    Frequency of Communication with Friends and Family: Three times a week    Frequency of Social Gatherings with Friends and Family: Three times a week    Attends Religious Services: Never  Active Member of Clubs or Organizations: Yes    Attends Archivist Meetings: More than 4 times per year    Marital Status: Married  Human resources officer Violence: Not At Risk (12/09/2021)   Humiliation, Afraid, Rape, and Kick questionnaire    Fear of Current or Ex-Partner: No    Emotionally Abused: No    Physically Abused: No    Sexually Abused: No    Review of Systems Per HPI.   Objective:   Vitals:   04/07/22 0811 04/07/22 0829  BP: (!) 140/82 110/72  Pulse: 60   Resp: 17   Temp: 98 F (36.7 C)   TempSrc: Oral   SpO2: 95%   Weight: 272 lb (123.4 kg)   Height: '6\' 1"'$  (1.854 m)     Physical Exam Vitals reviewed.  Constitutional:      Appearance: He is well-developed.  HENT:     Head: Normocephalic and atraumatic.  Neck:     Vascular: No carotid bruit or JVD.  Cardiovascular:     Rate and Rhythm: Normal rate and regular rhythm.     Heart sounds: Normal heart sounds. No murmur heard.    Comments: Distant heart sounds but regular rhythm. Pulmonary:     Effort: Pulmonary effort is normal.     Breath sounds: Normal breath sounds. No rales.  Musculoskeletal:     Right lower leg: No edema.     Left lower leg: No edema.  Skin:    General: Skin is warm and dry.   Neurological:     Mental Status: He is alert and oriented to person, place, and time.  Psychiatric:        Mood and Affect: Mood normal.      Orthostatic VS for the past 24 hrs (Last 3 readings):  BP- Sitting BP- Standing at 0 minutes BP- Standing at 3 minutes  04/07/22 0816 140/82 152/82 110/80   8:36 AM Standing  Assessment & Plan:  Jason [name] is a 86 y.o. male . Essential hypertension  History of hypotension History of orthostatic hypotension.  Lower readings at home with slight lightheadedness, orthostatic but blood pressure only dropped to 110/80 range here in the office, much different than his home readings.  Has been having some elevated readings at times as well.  I am hesitant to decrease medications further at this time given the elevated blood pressures.  He does report maintaining hydration, no new symptoms, no chest pains.  No med changes today.  Asked that he follow-up with cardiology and he obtain an appointment while in the office.  Advised to bring his blood pressure meter to that visit to make sure it is reading accurately.  RTC/ER precautions.  No orders of the defined types were placed in this encounter.  Patient Instructions  I would like you to call your cardiologist to discuss the blood pressure variations you are experiencing.  In the office the blood pressure did not drop as you are experiencing at home.  Based on the borderline elevated readings I am hesitant to change medications at this time.  Make sure to bring your meter to your next visit to verify accuracy. Continue to drink sufficient fluids Return to the clinic or go to the nearest emergency room if any of your symptoms worsen or new symptoms occur.     Signed,   Merri Ray, MD [place] 04/07/22 8:36 AM

## 2022-04-07 NOTE — Patient Instructions (Addendum)
I would like you to call your cardiologist to discuss the blood pressure variations you are experiencing.  In the office the blood pressure did not drop as you are experiencing at home.  Based on the borderline elevated readings I am hesitant to change medications at this time.  Make sure to bring your meter to your next visit to verify accuracy. Continue to drink sufficient fluids Return to the clinic or go to the nearest emergency room if any of your symptoms worsen or new symptoms occur.

## 2022-04-12 ENCOUNTER — Ambulatory Visit (HOSPITAL_BASED_OUTPATIENT_CLINIC_OR_DEPARTMENT_OTHER): Payer: Medicare HMO | Admitting: Family

## 2022-04-12 ENCOUNTER — Encounter (HOSPITAL_BASED_OUTPATIENT_CLINIC_OR_DEPARTMENT_OTHER): Payer: Self-pay | Admitting: Family

## 2022-04-12 VITALS — BP 113/67 | HR 69 | Ht 73.0 in | Wt 277.3 lb

## 2022-04-12 DIAGNOSIS — D519 Vitamin B12 deficiency anemia, unspecified: Secondary | ICD-10-CM | POA: Diagnosis not present

## 2022-04-12 DIAGNOSIS — G9332 Myalgic encephalomyelitis/chronic fatigue syndrome: Secondary | ICD-10-CM | POA: Diagnosis not present

## 2022-04-12 DIAGNOSIS — I25118 Atherosclerotic heart disease of native coronary artery with other forms of angina pectoris: Secondary | ICD-10-CM

## 2022-04-12 DIAGNOSIS — I10 Essential (primary) hypertension: Secondary | ICD-10-CM | POA: Diagnosis not present

## 2022-04-12 DIAGNOSIS — R5382 Chronic fatigue, unspecified: Secondary | ICD-10-CM | POA: Diagnosis not present

## 2022-04-12 DIAGNOSIS — I951 Orthostatic hypotension: Secondary | ICD-10-CM

## 2022-04-12 DIAGNOSIS — E538 Deficiency of other specified B group vitamins: Secondary | ICD-10-CM | POA: Diagnosis not present

## 2022-04-12 MED ORDER — LOSARTAN POTASSIUM 25 MG PO TABS: 25.0000 mg | ORAL_TABLET | Freq: Two times a day (BID) | ORAL | 2 refills | Status: DC

## 2022-04-12 NOTE — Progress Notes (Signed)
Office Visit    Patient Name: Jason [name] Date of Encounter: 04/12/2022  PCP:  [name], MD   [place]  Cardiologist:  Skeet Latch, MD  Advanced Practice Provider:  No care team member to display Electrophysiologist:  None   Chief Complaint    Jason [name] is a 86 y.o. male presents today for hypotension   Past Medical History    Past Medical History:  Diagnosis Date   Ascending aortic aneurysm ([place]) 01/22/2022   CAD in native artery 01/22/2022   Cancer [facility])    Cataract    Diabetes mellitus without complication ([place])    Gait instability 01/22/2022   Hypertension    Sleep apnea    History reviewed. No pertinent surgical history.  Allergies  Allergies  Allergen Reactions   Lisinopril Swelling and Cough    History of Present Illness    [name] is a 86 y.o. male with a hx of CAD, mild ascending aortic aneurysm, OSA on CPAP, HTN, HLD, DM2 last seen 03/25/22.  Prior CTA 11/2021 at the [facility] with scattered plaque with mild stenosis in the LAD, moderate disease in distal RCA, mild to moderate disease in LCx with normal FFR.  Coronary calcium score 340.  Ascending aorta 3.9 cm.  Established with Dr. Oval Linsey 01/22/2022.  Blood pressure elevated on initial and repeat.  Amlodipine 5 mg daily.  Metoprolol reduced to 25 mg due to bradycardia which was presumed to be contributing to dizziness.  BP goal less than 140/90 and then continue to aim for 130/80 if he tolerates.  At follow up with PharmD 02/22/22 Metoprolol reduced to 12.'5mg'$  QD due to persistent dizziness. Seen 03/25/22 with persistent dizziness and Amlodipine moved to evening.   He presents today for follow up.  Notes generalized fatigue which has persisted despite reduced dose of metoprolol.  Even in the morning will wake up feeling tired and have to take an hour nap.  Wears CPAP regularly and reports no difficulty with usage.  Checking blood pressure at home with arm on arm cuff which is found  to be accurate in clinic today. SBP as low as 80s/60s while standing symptomatic with lightheadedness. No near syncope, syncope.   EKGs/Labs/Other Studies Reviewed:   The following studies were reviewed today:  EKG:  No EKG today  Recent Labs: 12/10/2021: Hemoglobin 14.1; Platelets 212.0 Repeated and verified X2. 03/18/2022: ALT 16; BUN 24; Creatinine, Ser 1.25; Potassium 4.6; Sodium 135  Recent Lipid Panel    Component Value Date/Time   CHOL 144 03/18/2022 0801   TRIG 153.0 (H) 03/18/2022 0801   HDL 42.50 03/18/2022 0801   CHOLHDL 3 03/18/2022 0801   VLDL 30.6 03/18/2022 0801   LDLCALC 71 03/18/2022 0801   Home Medications   Current Meds  Medication Sig   allopurinol (ZYLOPRIM) 300 MG tablet Take 1 tablet by mouth daily.   amLODipine (NORVASC) 5 MG tablet Take 1 tablet (5 mg total) by mouth daily.   aspirin EC 81 MG tablet Take 1 tablet every day by oral route.   atorvastatin (LIPITOR) 40 MG tablet TAKE 1 TABLET BY MOUTH EVERY DAY   glimepiride (AMARYL) 4 MG tablet Take 1/2 tablet by mouth once a day   losartan (COZAAR) 25 MG tablet Take 1 tablet (25 mg total) by mouth in the morning and at bedtime.   meclizine (ANTIVERT) 12.5 MG tablet TAKE 1 TABLET (12.5 MG TOTAL) BY MOUTH IN THE MORNING, AT NOON, AND AT BEDTIME.  metFORMIN (GLUCOPHAGE) 500 MG tablet Take 2 tablets (1,000 mg total) by mouth 2 (two) times daily with a meal.   metoprolol tartrate (LOPRESSOR) 25 MG tablet Take 12.5 mg by mouth 2 (two) times daily. 1/2 tablet 2 times daily   omeprazole (PRILOSEC) 20 MG capsule Take 1 capsule (20 mg total) by mouth daily.   pioglitazone (ACTOS) 15 MG tablet TAKE 1 TABLET (15 MG TOTAL) BY MOUTH DAILY.   venlafaxine (EFFEXOR) 75 MG tablet TAKE 1 TABLET BY MOUTH EVERY DAY   [DISCONTINUED] losartan (COZAAR) 100 MG tablet Take 1 tablet (100 mg total) by mouth daily.     Review of Systems      All other systems reviewed and are otherwise negative except as noted above.  Physical  Exam    VS:  BP 113/67 (BP Location: Left Arm, Patient Position: Sitting, Cuff Size: Normal)   Pulse 69   Ht '6\' 1"'$  (1.854 m)   Wt 277 lb 4.8 oz (125.8 kg)   SpO2 98%   BMI 36.59 kg/m  , BMI Body mass index is 36.59 kg/m.  Wt Readings from Last 3 Encounters:  04/12/22 277 lb 4.8 oz (125.8 kg)  04/07/22 272 lb (123.4 kg)  03/04/22 271 lb 6.4 oz (123.1 kg)    GEN: Well nourished, well developed, in no acute distress. HEENT: normal. Neck: Supple, no JVD, carotid bruits, or masses. Cardiac: RRR, no murmurs, rubs, or gallops. No clubbing, cyanosis, edema.  Radials/PT 2+ and equal bilaterally.  Respiratory:  Respirations regular and unlabored, clear to auscultation bilaterally. GI: Soft, nontender, nondistended. MS: No deformity or atrophy. Skin: Warm and dry, no rash. Neuro:  Strength and sensation are intact. Psych: Normal affect.  Assessment & Plan    Hypertension / Orthostatic hypotension - Significant orthostasis in clinic and at home associated with lightheadedness but no syncope. Reduce Losartan from '100mg'$  QD to '25mg'$   BID. Continue Amloidpine '5mg'$  QHS, Metoprolol Tartrate 12.'5mg'$  BID. Stays well hydrated, eats regular meals. If symptoms do not improve with reduction of medication, consider echo. Once hypotensive symptoms resolve, plan to refer to PREP. Education provided on orthostatic precautions: Stay well hydrated, eat regular meals, wear compression socks, with position changes slowly.   Orthostatic VS for the past 24 hrs (Last 3 readings):  BP- Lying Pulse- Lying BP- Sitting Pulse- Sitting BP- Standing at 0 minutes Pulse- Standing at 0 minutes BP- Standing at 3 minutes Pulse- Standing at 3 minutes  04/12/22 1019 137/80 66 113/73 68 (!) 87/53 59 90/61 81     OSA- CPAP compliance encouraged. Endorses using regularly.  Fatigue - Not improved since reduced dose Metoprolol. Previous B12 deficiency not presently on supplement. Update CBC, B12, vitamin D, thyroid panel. To assess  for etiology of fatigue.  CAD - Nonobstructive by CTA 11/2021. Stable with no anginal symptoms. No indication for ischemic evaluation.  GDMT aspirin, atorvastatin, metoprolol. Heart healthy diet and regular cardiovascular exercise encouraged.     HLD, LDL goal less than 70 - Continue Atorvastatin '40mg'$  QD.  Ascending aortic aneurysm -3.9 cm on chest CT 11/2021.  Repeat echo 11/2022 for monitoring already ordered.          Disposition: Follow up  as scheduled  with Skeet Latch, MD or APP.  Signed, Loel Dubonnet, NP 04/12/2022, 11:59 AM [place]

## 2022-04-12 NOTE — Patient Instructions (Signed)
Medication Instructions:  Your physician has recommended you make the following change in your medication:    STOP Losartan '100mg'$  daily  START Losartan '25mg'$  twice daily  *take about 12 hours apart with your Metoprolol  *If you need a refill on your cardiac medications before your next appointment, please call your pharmacy*   Lab Work: Your physician recommends that you return for lab work today: vitamin D level, vitamin B12, thyroid panel, CBC  If you have labs (blood work) drawn today and your tests are completely normal, you will receive your results only by: [place] (if you have MyChart) OR A paper copy in the mail If you have any lab test that is abnormal or we need to change your treatment, we will call you to review the results.   Testing/Procedures: None ordered today.    Follow-Up: At [facility], you and your health needs are our priority.  As part of our continuing mission to provide you with exceptional heart care, we have created designated Provider Care Teams.  These Care Teams include your primary Cardiologist (physician) and Advanced Practice Providers (APPs -  Physician Assistants and Nurse Practitioners) who all work together to provide you with the care you need, when you need it.  We recommend signing up for the patient portal called "MyChart".  Sign up information is provided on this After Visit Summary.  MyChart is used to connect with patients for Virtual Visits (Telemedicine).  Patients are able to view lab/test results, encounter notes, upcoming appointments, etc.  Non-urgent messages can be sent to your provider as well.   To learn more about what you can do with MyChart, go to [email or web address].    Your next appointment:   As scheduled  Other Instructions  Orthostatic Hypotension Blood pressure is a measurement of how strongly, or weakly, your circulating blood is pressing against the walls of your arteries. Orthostatic  hypotension is a drop in blood pressure that can happen when you change positions, such as when you go from lying down to standing. Arteries are blood vessels that carry blood from your heart throughout your body. When blood pressure is too low, you may not get enough blood to your brain or to the rest of your organs. Orthostatic hypotension can cause light-headedness, sweating, rapid heartbeat, blurred vision, and fainting. These symptoms require further investigation into the cause. What are the causes? Orthostatic hypotension can be caused by many things, including: Sudden changes in posture, such as standing up quickly after you have been sitting or lying down. Loss of blood (anemia) or loss of body fluids (dehydration). Heart problems, neurologic problems, or hormone problems. Pregnancy. Aging. The risk for this condition increases as you get older. Severe infection (sepsis). Certain medicines, such as medicines for high blood pressure or medicines that make the body lose excess fluids (diuretics). What are the signs or symptoms? Symptoms of this condition may include: Weakness, light-headedness, or dizziness. Sweating. Blurred vision. Tiredness (fatigue). Rapid heartbeat. Fainting, in severe cases. How is this diagnosed? This condition is diagnosed based on: Your symptoms and medical history. Your blood pressure measurements. Your health care provider will check your blood pressure when you are: Lying down. Sitting. Standing. A blood pressure reading is recorded as two numbers, such as "120 over 80" (or 120/80). The first ("top") number is called the systolic pressure. It is a measure of the pressure in your arteries as your heart beats. The second ("bottom") number is called the diastolic pressure. It is  a measure of the pressure in your arteries when your heart relaxes between beats. Blood pressure is measured in a unit called mmHg. Healthy blood pressure for most adults is 120/80  mmHg. Orthostatic hypotension is defined as a 20 mmHg drop in systolic pressure or a 10 mmHg drop in diastolic pressure within 3 minutes of standing. Other information or tests that may be used to diagnose orthostatic hypotension include: Your other vital signs, such as your heart rate and temperature. Blood tests. An electrocardiogram (ECG) or echocardiogram. A Holter monitor. This is a device you wear that records your heart rhythm continuously, usually for 24-48 hours. Tilt table test. For this test, you will be safely secured to a table that moves you from a lying position to an upright position. Your heart rhythm and blood pressure will be monitored during the test. How is this treated? This condition may be treated by: Changing your diet. This may involve eating more salt (sodium) or drinking more water. Changing the dosage of certain medicines you are taking that might be lowering your blood pressure. Correcting the underlying reason for the orthostatic hypotension. Wearing compression stockings. Taking medicines to raise your blood pressure. Avoiding actions that trigger symptoms. Follow these instructions at home: Medicines Take over-the-counter and prescription medicines only as told by your health care provider. Follow instructions from your health care provider about changing the dosage of your current medicines, if this applies. Do not stop or adjust any of your medicines on your own. Eating and drinking  Drink enough fluid to keep your urine pale yellow. Eat extra salt only as directed. Do not add extra salt to your diet unless advised by your health care provider. Eat frequent, small meals. Avoid standing up suddenly after eating. General instructions  Get up slowly from lying down or sitting positions. This gives your blood pressure a chance to adjust. Avoid hot showers and excessive heat as directed by your health care provider. Engage in regular physical activity as  directed by your health care provider. If you have compression stockings, wear them as told. Keep all follow-up visits. This is important. Contact a health care provider if: You have a fever for more than 2-3 days. You feel more thirsty than usual. You feel dizzy or weak. Get help right away if: You have chest pain. You have a fast or irregular heartbeat. You become sweaty or feel light-headed. You feel short of breath. You faint. You have any symptoms of a stroke. "BE FAST" is an easy way to remember the main warning signs of a stroke: B - Balance. Signs are dizziness, sudden trouble walking, or loss of balance. E - Eyes. Signs are trouble seeing or a sudden change in vision. F - Face. Signs are sudden weakness or numbness of the face, or the face or eyelid drooping on one side. A - Arms. Signs are weakness or numbness in an arm. This happens suddenly and usually on one side of the body. S - Speech. Signs are sudden trouble speaking, slurred speech, or trouble understanding what people say. T - Time. Time to call emergency services. Write down what time symptoms started. You have other signs of a stroke, such as: A sudden, severe headache with no known cause. Nausea or vomiting. Seizure. These symptoms may represent a serious problem that is an emergency. Do not wait to see if the symptoms will go away. Get medical help right away. Call your local emergency services (911 in the [place] Do not drive  yourself to the hospital. Summary Orthostatic hypotension is a sudden drop in blood pressure. It can cause light-headedness, sweating, rapid heartbeat, blurred vision, and fainting. Orthostatic hypotension can be diagnosed by having your blood pressure taken while lying down, sitting, and then standing. Treatment may involve changing your diet, wearing compression stockings, sitting up slowly, adjusting your medicines, or correcting the underlying reason for the orthostatic hypotension. Get  help right away if you have chest pain, a fast or irregular heartbeat, or symptoms of a stroke. This information is not intended to replace advice given to you by your health care provider. Make sure you discuss any questions you have with your health care provider. Document Revised: 10/16/2020 Document Reviewed: 10/16/2020 Elsevier Patient Education  [place].

## 2022-04-13 DIAGNOSIS — E119 Type 2 diabetes mellitus without complications: Secondary | ICD-10-CM | POA: Diagnosis not present

## 2022-04-18 LAB — CBC
Hematocrit: 39.3 % (ref 37.5–51.0)
Hemoglobin: 13 g/dL (ref 13.0–17.7)
MCH: 29.7 pg (ref 26.6–33.0)
MCHC: 33.1 g/dL (ref 31.5–35.7)
MCV: 90 fL (ref 79–97)
Platelets: 275 10*3/uL (ref 150–450)
RBC: 4.37 x10E6/uL (ref 4.14–5.80)
RDW: 13.1 % (ref 11.6–15.4)
WBC: 8.9 10*3/uL (ref 3.4–10.8)

## 2022-04-18 LAB — THYROID PANEL WITH TSH
Free Thyroxine Index: 2.1 (ref 1.2–4.9)
T3 Uptake Ratio: 29 % (ref 24–39)
T4, Total: 7.1 ug/dL (ref 4.5–12.0)
TSH: 3.41 u[IU]/mL (ref 0.450–4.500)

## 2022-04-18 LAB — VITAMIN D 1,25 DIHYDROXY
Vitamin D 1, 25 (OH)2 Total: 29 pg/mL
Vitamin D2 1, 25 (OH)2: <10 pg/mL
Vitamin D3 1, 25 (OH)2: 29 pg/mL

## 2022-04-18 LAB — B12 AND FOLATE PANEL
Folate: 18.5 ng/mL (ref >3.0)
Vitamin B-12: 422 pg/mL (ref 232–1245)

## 2022-05-07 DIAGNOSIS — H04123 Dry eye syndrome of bilateral lacrimal glands: Secondary | ICD-10-CM | POA: Diagnosis not present

## 2022-05-07 DIAGNOSIS — D3132 Benign neoplasm of left choroid: Secondary | ICD-10-CM | POA: Diagnosis not present

## 2022-05-07 DIAGNOSIS — H353131 Nonexudative age-related macular degeneration, bilateral, early dry stage: Secondary | ICD-10-CM | POA: Diagnosis not present

## 2022-05-26 ENCOUNTER — Other Ambulatory Visit: Payer: Self-pay | Admitting: Family Medicine

## 2022-05-26 DIAGNOSIS — R42 Dizziness and giddiness: Secondary | ICD-10-CM

## 2022-05-31 DIAGNOSIS — M7541 Impingement syndrome of right shoulder: Secondary | ICD-10-CM | POA: Diagnosis not present

## 2022-05-31 DIAGNOSIS — M75101 Unspecified rotator cuff tear or rupture of right shoulder, not specified as traumatic: Secondary | ICD-10-CM | POA: Diagnosis not present

## 2022-06-23 DIAGNOSIS — H02135 Senile ectropion of left lower eyelid: Secondary | ICD-10-CM | POA: Diagnosis not present

## 2022-06-24 ENCOUNTER — Other Ambulatory Visit (HOSPITAL_BASED_OUTPATIENT_CLINIC_OR_DEPARTMENT_OTHER): Payer: Self-pay | Admitting: Family

## 2022-06-24 ENCOUNTER — Other Ambulatory Visit: Payer: Self-pay | Admitting: Family Medicine

## 2022-06-24 ENCOUNTER — Telehealth: Payer: Self-pay

## 2022-06-24 DIAGNOSIS — M109 Gout, unspecified: Secondary | ICD-10-CM

## 2022-06-24 DIAGNOSIS — I10 Essential (primary) hypertension: Secondary | ICD-10-CM

## 2022-06-24 DIAGNOSIS — I951 Orthostatic hypotension: Secondary | ICD-10-CM

## 2022-06-24 DIAGNOSIS — E785 Hyperlipidemia, unspecified: Secondary | ICD-10-CM

## 2022-06-24 NOTE — Telephone Encounter (Signed)
Rx request sent to pharmacy.  

## 2022-06-24 NOTE — Telephone Encounter (Signed)
Filled out handicap parking placard and placed in your sign folder

## 2022-06-24 NOTE — Telephone Encounter (Signed)
Paperwork completed and placed in CMA bin at back nurse station.  Initially completed for 71-monthtemporary placard for neurologic/orthopedic condition, but can extend that if needed at that time depending on any continued issues.  Let me know if there are questions.

## 2022-06-25 NOTE — Telephone Encounter (Signed)
Called patient and informed him his form is ready for pick up or mailing, patient states he will come by today and pick this up.  Placed in designated pick up file cabinet

## 2022-07-01 ENCOUNTER — Ambulatory Visit: Payer: Medicare HMO

## 2022-07-07 ENCOUNTER — Other Ambulatory Visit: Payer: Self-pay | Admitting: Family Medicine

## 2022-07-07 DIAGNOSIS — E1169 Type 2 diabetes mellitus with other specified complication: Secondary | ICD-10-CM

## 2022-07-12 DIAGNOSIS — M7541 Impingement syndrome of right shoulder: Secondary | ICD-10-CM | POA: Diagnosis not present

## 2022-07-12 DIAGNOSIS — M75101 Unspecified rotator cuff tear or rupture of right shoulder, not specified as traumatic: Secondary | ICD-10-CM | POA: Diagnosis not present

## 2022-07-14 ENCOUNTER — Encounter: Payer: Self-pay | Admitting: Family Medicine

## 2022-07-14 ENCOUNTER — Ambulatory Visit (INDEPENDENT_AMBULATORY_CARE_PROVIDER_SITE_OTHER): Payer: Medicare HMO | Admitting: Family Medicine

## 2022-07-14 VITALS — BP 128/60 | HR 75 | Temp 97.8°F | Ht 73.0 in | Wt 278.2 lb

## 2022-07-14 DIAGNOSIS — E162 Hypoglycemia, unspecified: Secondary | ICD-10-CM | POA: Diagnosis not present

## 2022-07-14 DIAGNOSIS — I951 Orthostatic hypotension: Secondary | ICD-10-CM | POA: Diagnosis not present

## 2022-07-14 DIAGNOSIS — E669 Obesity, unspecified: Secondary | ICD-10-CM

## 2022-07-14 DIAGNOSIS — E1169 Type 2 diabetes mellitus with other specified complication: Secondary | ICD-10-CM

## 2022-07-14 DIAGNOSIS — R5383 Other fatigue: Secondary | ICD-10-CM | POA: Diagnosis not present

## 2022-07-14 LAB — POCT GLYCOSYLATED HEMOGLOBIN (HGB A1C): Hemoglobin A1C: 7.5 % — AB (ref 4.0–5.6)

## 2022-07-14 LAB — GLUCOSE, POCT (MANUAL RESULT ENTRY): POC Glucose: 155 mg/dl — AB (ref 70–99)

## 2022-07-14 NOTE — Progress Notes (Signed)
Subjective:  Patient ID: Jason [name], male    DOB: [birth year]  Age: 86 y.o. MRN: [id number]  CC:  Chief Complaint  Patient presents with   Hypertension    Pt states there are times where he checks his blood pressure and its really low along with his sugars, he states that he is always tired      HPI Jason [name] presents for   Hypotension, hypoglycemia, fatigue As above.  Last visit with me in August.   Blood pressure 110/72 at that time.  previously was hypotensive therapy, down to 80/60 with treatment. with physical initially thought to be due to decreased p.o. intake on the day of PT.  His metoprolol had been decreased to 25 mg and ultimately 12.5 mg twice daily and started on amlodipine by cardiology in June with recommendations of continued hydration/maintenance of hydration.  Stable renal function in August.  Repeat low blood pressures in August, and at that time was taking amlodipine 5 mg, losartan 100 mg, metoprolol 25 mg (twice per day).  There was some discrepancy of an office readings versus home readings.  He had some elevated readings at times as well, was hesitant to decrease medications further given his elevated blood pressures.  Asked that he follow-up with his cardiologist to adjust medications further.  He was seen by cardiology August 28.  Losartan was decreased from 100 mg daily to 25 mg twice daily.  Continue amlodipine 5 mg nightly, metoprolol at 12.5 mg twice daily.  Option of echo if symptoms do not improve.  Updated labs also ordered with vitamin D 29, B12 normal at 422, TSH normal at 3.4, CBC normal with hemoglobin 13.   Reports 4 episodes of lower BP in evening past few weeks. Walking with cane. Never feels completely stable.  BP 116/60, similar readings the other times. felt lightheaded, but no syncope. Notes after sitting for awhile and then standing, notes later in day. No chest pain. Higher readings of 160/83 at times as well.  Taking 12.'5mg'$  metoprolol BID, amlodipine  '5mg'$  qd, losartan '25mg'$  BID - no missed or additional doses.  Using pill box/minder.  Drinking fluids. Electrolytes at times. Regular meals.   BP Readings from Last 3 Encounters:  07/14/22 128/60  04/12/22 113/67  04/07/22 110/72    Orhtostatics: 100/70 - left arm. 130/80 R arm Sitting, 88/62 standing.   Diabetes: As above, concern of low blood sugars.  No recent A1c, last discussed diabetes in July.  Treated with metformin, Amaryl, Actos.  We discusse decreased glimepiride dosing if hypoglycemia, was on 2 mg dose in the morning at that visit.  Metformin 1000 mg twice daily, Actos 15 mg daily. Still on same doses.  Has some low readings at times. 4 in past month. Down to 60, 59. Feels lightheaded,. Fatigue. Improves with juice, rest.  Usual blood sugars 110-120 on home readings.   Lab Results  Component Value Date   HGBA1C 7.3 (H) 03/18/2022   HGBA1C 7.5 (A) 12/17/2021   HGBA1C 7.5 (H) 09/09/2021   Lab Results  Component Value Date   MICROALBUR 42.4 (H) 09/09/2021   LDLCALC 71 03/18/2022   CREATININE 1.25 03/18/2022      History 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  Gastroesophageal reflux disease 02/22/2022   Gout 02/22/2022   Benign paroxysmal positional vertigo 02/22/2022   CAD in native artery 01/22/2022   Ascending aortic aneurysm ([place]) 01/22/2022   Gait instability 01/22/2022   Essential hypertension 01/17/2022   Type 2 diabetes mellitus with obesity ([place]) 01/17/2022   HLD (hyperlipidemia) 01/17/2022   Obstructive sleep apnea 01/17/2022   Sensorineural hearing loss (SNHL), bilateral 11/09/2021   Orthostatic hypotension 11/09/2021   Imbalance 11/09/2021   Overweight  02/21/2020   First degree heart block 02/21/2020   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   Vitamin D deficiency 05/31/2016   History of malignant neoplasm of prostate 05/31/2016   History of malignant neoplasm of skin 05/31/2016   Bilateral hearing loss 05/31/2016   Anxiety state 05/31/2016   Bronchospasm 09/24/2010   Cough 09/19/2010   Acute bronchitis 09/19/2010   Allergic rhinitis due to pollen 09/19/2010   Acute sinusitis 09/19/2010   Past Medical History:  Diagnosis Date   Ascending aortic aneurysm ([place]) 01/22/2022   CAD in native artery 01/22/2022   Cancer [facility])    Cataract    Diabetes mellitus without complication ([place])    Gait instability 01/22/2022   Hypertension    Sleep apnea    No past surgical history on file. Allergies  Allergen Reactions   Lisinopril Cough, Swelling and Other (See Comments)   Prior to Admission medications   Medication Sig Start Date End Date Taking? Authorizing Provider  allopurinol (ZYLOPRIM) 300 MG tablet TAKE 1 TABLET BY MOUTH EVERY DAY 06/24/22  Yes [name], MD  amLODipine (NORVASC) 5 MG tablet Take 1 tablet (5 mg total) by mouth daily. 03/04/22  Yes [name], MD  aspirin EC 81 MG tablet Take 1 tablet every day by oral route. 02/21/20  Yes [provider]  atorvastatin (LIPITOR) 40 MG tablet TAKE 1 TABLET BY MOUTH EVERY DAY 06/24/22  Yes [name], MD  glimepiride (AMARYL) 4 MG tablet Take 1/2 tablet by mouth once a day 02/22/22  Yes Pavero, Harrell Gave, RPH  losartan (COZAAR) 25 MG tablet TAKE 1 TABLET (25 MG) BY MOUTH IN THE MORNING AND AT BEDTIME 06/24/22  Yes Skeet Latch, MD  meclizine (ANTIVERT) 12.5 MG tablet TAKE 1 TABLET (12.5 MG TOTAL) BY MOUTH IN THE MORNING, AT NOON, AND AT BEDTIME. 05/26/22  Yes [name], MD  metFORMIN (GLUCOPHAGE) 500 MG tablet TAKE 2 TABLETS (1,000 MG TOTAL) BY MOUTH 2 (TWO) TIMES DAILY WITH A MEAL. 07/07/22  Yes [name], MD  metoprolol tartrate (LOPRESSOR) 25 MG tablet  Take 12.5 mg by mouth 2 (two) times daily. 1/2 tablet 2 times daily 02/22/22  Yes Skeet Latch, MD  omeprazole (PRILOSEC) 20 MG capsule Take 1 capsule (20 mg total) by mouth daily. 03/04/22  Yes [name], MD  pioglitazone (ACTOS) 15 MG tablet TAKE 1 TABLET (15 MG TOTAL) BY MOUTH DAILY. 06/24/22  Yes [name], MD  venlafaxine (EFFEXOR) 75 MG tablet TAKE 1 TABLET BY MOUTH EVERY DAY 03/29/22  Yes [name], MD   Social History   Socioeconomic History   Marital status: Married    Spouse name: Not on file   Number of children: Not on file   Years of education: Not on file   Highest education level: Not on file  Occupational History   Not on file  Tobacco Use   Smoking status: Former    Types: Pipe   Smokeless tobacco: Never  Substance and Sexual Activity  Alcohol use: Yes    Alcohol/week: 2.0 standard drinks of alcohol    Types: 2 Glasses of wine per week   Drug use: Never   Sexual activity: Never  Other Topics Concern   Not on file  Social History Narrative   Not on file   Social Determinants of Health   Financial Resource Strain: Low Risk  (12/09/2021)   Overall Financial Resource Strain (CARDIA)    Difficulty of Paying Living Expenses: Not hard at all  Food Insecurity: No Food Insecurity (12/09/2021)   Hunger Vital Sign    Worried About Running Out of Food in the Last Year: Never true    Ran Out of Food in the Last Year: Never true  Transportation Needs: No Transportation Needs (12/09/2021)   PRAPARE - Hydrologist (Medical): No    Lack of Transportation (Non-Medical): No  Physical Activity: Insufficiently Active (12/09/2021)   Exercise Vital Sign    Days of Exercise per Week: 2 days    Minutes of Exercise per Session: 30 min  Stress: No Stress Concern Present (12/09/2021)   [place]    Feeling of Stress : Not at all  Social Connections: Moderately  Integrated (12/09/2021)   Social Connection and Isolation Panel [NHANES]    Frequency of Communication with Friends and Family: Three times a week    Frequency of Social Gatherings with Friends and Family: Three times a week    Attends Religious Services: Never    Active Member of Clubs or Organizations: Yes    Attends Archivist Meetings: More than 4 times per year    Marital Status: Married  Human resources officer Violence: Not At Risk (12/09/2021)   Humiliation, Afraid, Rape, and Kick questionnaire    Fear of Current or Ex-Partner: No    Emotionally Abused: No    Physically Abused: No    Sexually Abused: No    Review of Systems Per HPI.   Objective:   Vitals:   07/14/22 1521  BP: 128/60  Pulse: 75  Temp: 97.8 F (36.6 C)  SpO2: 96%  Weight: 278 lb 3.2 oz (126.2 kg)  Height: '6\' 1"'$  (1.854 m)     Physical Exam Vitals reviewed.  Constitutional:      Appearance: He is well-developed.  HENT:     Head: Normocephalic and atraumatic.  Neck:     Vascular: No carotid bruit or JVD.  Cardiovascular:     Rate and Rhythm: Normal rate and regular rhythm.     Heart sounds: Normal heart sounds. No murmur heard. Pulmonary:     Effort: Pulmonary effort is normal.     Breath sounds: Normal breath sounds. No rales.  Musculoskeletal:     Right lower leg: No edema.     Left lower leg: No edema.  Skin:    General: Skin is warm and dry.  Neurological:     Mental Status: He is alert and oriented to person, place, and time.  Psychiatric:        Mood and Affect: Mood normal.      Results for orders placed or performed in visit on 07/14/22  POCT glucose (manual entry)  Result Value Ref Range   POC Glucose 155 (A) 70 - 99 mg/dl  POCT glycosylated hemoglobin (Hb A1C)  Result Value Ref Range   Hemoglobin A1C 7.5 (A) 4.0 - 5.6 %   HbA1c POC (<> result, manual entry)  HbA1c, POC (prediabetic range)     HbA1c, POC (controlled diabetic range)       Assessment & Plan:  Jason [name] is a 86 y.o. male . Type 2 diabetes mellitus with obesity ([place]) - Plan: Basic metabolic panel, POCT glucose (manual entry), POCT glycosylated hemoglobin (Hb A1C) Hypoglycemia  -Episodes of hypoglycemia as above, overall stable A1c.  Stop sulfonylurea at this time with home monitoring, recheck in the next 2 weeks.  Regular meals discussed.  ER precautions discussed.  Orthostatic hypotension Fatigue  -Difficult situation, history of orthostatic hypotension, previously with med adjustment at cardiology, appears to have return of similar symptoms.  Orthostatic in office as above.  However he has had some elevated readings at home as well, I am hesitant to decrease meds at this time.  Asked that he call his cardiologist to discuss meds and changes, may need acute visit.  Regular meals, fluids and slow standing from seated position with use of assistive device for now.  Orthostasis precautions.  ER/911 precautions given. No orders of the defined types were placed in this encounter.  Patient Instructions  I would recommend discussing your blood pressure meds with cardiology as some high readings with low readings.  Make sure to continue to drink plenty of fluids.     Signed,   Merri Ray, MD [place] 07/14/22 4:30 PM  +

## 2022-07-14 NOTE — Patient Instructions (Addendum)
I would recommend discussing your blood pressure meds with cardiology as some high readings with low readings.  You may need to have echo as planned with cardiology. Make sure to continue to drink plenty of fluids.  Get up slowly form seated position for now until discussing with cardiology. Call cardiology tomorrow.   Stop the glimepiride completely for now. No other changes for now. If any return of low blodd sugars off the glimepiride, let me know.   Return to the clinic or go to the nearest emergency room if any of your symptoms worsen or new symptoms occur.

## 2022-07-15 LAB — BASIC METABOLIC PANEL
BUN: 32 mg/dL — ABNORMAL HIGH (ref 6–23)
CO2: 27 mEq/L (ref 19–32)
Calcium: 9.3 mg/dL (ref 8.4–10.5)
Chloride: 104 mEq/L (ref 96–112)
Creatinine, Ser: 1.42 mg/dL (ref 0.40–1.50)
GFR: 45.36 mL/min — ABNORMAL LOW (ref >60.00)
Glucose, Bld: 145 mg/dL — ABNORMAL HIGH (ref 70–99)
Potassium: 5.3 mEq/L — ABNORMAL HIGH (ref 3.5–5.1)
Sodium: 138 mEq/L (ref 135–145)

## 2022-07-19 ENCOUNTER — Encounter: Payer: Self-pay | Admitting: Nurse Practitioner

## 2022-07-19 ENCOUNTER — Ambulatory Visit: Payer: Medicare HMO | Attending: Nurse Practitioner | Admitting: Nurse Practitioner

## 2022-07-19 VITALS — BP 126/72 | HR 76 | Ht 73.0 in | Wt 275.2 lb

## 2022-07-19 DIAGNOSIS — I7121 Aneurysm of the ascending aorta, without rupture: Secondary | ICD-10-CM

## 2022-07-19 DIAGNOSIS — E669 Obesity, unspecified: Secondary | ICD-10-CM

## 2022-07-19 DIAGNOSIS — E785 Hyperlipidemia, unspecified: Secondary | ICD-10-CM

## 2022-07-19 DIAGNOSIS — I951 Orthostatic hypotension: Secondary | ICD-10-CM | POA: Diagnosis not present

## 2022-07-19 DIAGNOSIS — G4733 Obstructive sleep apnea (adult) (pediatric): Secondary | ICD-10-CM | POA: Diagnosis not present

## 2022-07-19 DIAGNOSIS — E1169 Type 2 diabetes mellitus with other specified complication: Secondary | ICD-10-CM

## 2022-07-19 DIAGNOSIS — E119 Type 2 diabetes mellitus without complications: Secondary | ICD-10-CM

## 2022-07-19 DIAGNOSIS — I25118 Atherosclerotic heart disease of native coronary artery with other forms of angina pectoris: Secondary | ICD-10-CM

## 2022-07-19 DIAGNOSIS — I10 Essential (primary) hypertension: Secondary | ICD-10-CM | POA: Diagnosis not present

## 2022-07-19 NOTE — Progress Notes (Signed)
Office Visit    Patient Name: Jason [name] Date of Encounter: 07/19/2022  Primary Care Provider:  [name], MD Primary Cardiologist:  Skeet Latch, MD  Chief Complaint    86 year old male with a history of CAD, mild ascending aortic aneurysm, hypertension, orthostatic hypotension, hyperlipidemia, type 2 diabetes, and OSA on CPAP who presents for follow-up related to orthostatic hypotension.  Past Medical History    Past Medical History:  Diagnosis Date   Ascending aortic aneurysm ([place]) 01/22/2022   CAD in native artery 01/22/2022   [facility])    Cataract    Diabetes mellitus without complication ([place])    Gait instability 01/22/2022   Hypertension    Sleep apnea    No past surgical history on file.  Allergies  Allergies  Allergen Reactions   Lisinopril Cough, Swelling and Other (See Comments)    History of Present Illness    86 year old male with the above past medical history including CAD, mild ascending aortic aneurysm, hypertension, orthostatic hypotension, hyperlipidemia, type 2 diabetes, and OSA on CPAP.  Prior coronary CTA in 11/2021 at the [facility] showed scattered plaque with mild stenosis in the LAD, moderate disease in the distal RCA, mild to moderate disease in the left circumflex with normal FFR, coronary calcium score of 340, dilated ascending aorta measuring 3.9 cm.  He established care with Dr. Oval Linsey in June 2023.  BP was elevated at the time.  He noted intermittent dizziness.  Metoprolol was reduced in the setting of bradycardia.  He was last seen in the office on 04/12/2022 and noted persistent dizziness, generalized fatigue.  He was noted to be severely orthostatic, BP in the 80s/60s while standing, with associated symptoms of lightheadedness.  Losartan was decreased from 100 mg daily to 25 mg twice daily.  He was advised to take his amlodipine at the evening.  Low dose metoprolol was continued.  Including CBC, thyroid panel, B12, folate, and vitamin D were  unremarkable.  He saw his PCP on 07/14/2022 and noted ongoing episodes of low BP, lightheadedness.  He denied syncope.   He presents today for follow-up.  Since his last visit he notes ongoing symptomatic orthostatic hypotension. Essentially every time he stands he feels lightheaded, and his legs feel weak as if he may fall.  He also notes generalized tiredness.  He denies chest pain, dyspnea, palpitations, dizziness, presyncope, syncope, edema, PND, orthopnea, weight gain.    Home Medications    Current Outpatient Medications  Medication Sig Dispense Refill   allopurinol (ZYLOPRIM) 300 MG tablet TAKE 1 TABLET BY MOUTH EVERY DAY 90 tablet 1   aspirin EC 81 MG tablet Take 1 tablet every day by oral route.     atorvastatin (LIPITOR) 40 MG tablet TAKE 1 TABLET BY MOUTH EVERY DAY 90 tablet 1   losartan (COZAAR) 25 MG tablet TAKE 1 TABLET (25 MG) BY MOUTH IN THE MORNING AND AT BEDTIME 180 tablet 1   meclizine (ANTIVERT) 12.5 MG tablet TAKE 1 TABLET (12.5 MG TOTAL) BY MOUTH IN THE MORNING, AT NOON, AND AT BEDTIME. 90 tablet 1   metFORMIN (GLUCOPHAGE) 500 MG tablet TAKE 2 TABLETS (1,000 MG TOTAL) BY MOUTH 2 (TWO) TIMES DAILY WITH A MEAL. 180 tablet 1   metoprolol tartrate (LOPRESSOR) 25 MG tablet Take 12.5 mg by mouth 2 (two) times daily. 1/2 tablet 2 times daily 180 tablet 3   omeprazole (PRILOSEC) 20 MG capsule Take 1 capsule (20 mg total) by mouth daily. 90 capsule 1  pioglitazone (ACTOS) 15 MG tablet TAKE 1 TABLET (15 MG TOTAL) BY MOUTH DAILY. 90 tablet 1   venlafaxine (EFFEXOR) 75 MG tablet TAKE 1 TABLET BY MOUTH EVERY DAY 90 tablet 1   No current facility-administered medications for this visit.     Review of Systems    He denies chest pain, palpitations, dyspnea, pnd, orthopnea, n, v, dizziness, syncope, edema, weight gain, or early satiety. All other systems reviewed and are otherwise negative except as noted above.   Physical Exam    VS:  BP 126/72   Pulse 76   Ht '6\' 1"'$  (1.854 m)    Wt 275 lb 3.2 oz (124.8 kg)   SpO2 96%   BMI 36.31 kg/m  GEN: Well nourished, well developed, in no acute distress. HEENT: normal. Neck: Supple, no JVD, carotid bruits, or masses. Cardiac: RRR, no murmurs, rubs, or gallops. No clubbing, cyanosis, edema.  Radials/DP/PT 2+ and equal bilaterally.  Respiratory:  Respirations regular and unlabored, clear to auscultation bilaterally. GI: Soft, nontender, nondistended, BS + x 4. MS: no deformity or atrophy. Skin: warm and dry, no rash. Neuro:  Strength and sensation are intact. Psych: Normal affect.  Accessory Clinical Findings    ECG personally reviewed by me today -no EKG in office today.   Lab Results  Component Value Date   WBC 8.9 04/12/2022   HGB 13.0 04/12/2022   HCT 39.3 04/12/2022   MCV 90 04/12/2022   PLT 275 04/12/2022   Lab Results  Component Value Date   CREATININE 1.42 07/14/2022   BUN 32 (H) 07/14/2022   NA 138 07/14/2022   K 5.3 No hemolysis seen (H) 07/14/2022   CL 104 07/14/2022   CO2 27 07/14/2022   Lab Results  Component Value Date   ALT 16 03/18/2022   AST 17 03/18/2022   ALKPHOS 75 03/18/2022   BILITOT 0.4 03/18/2022   Lab Results  Component Value Date   CHOL 144 03/18/2022   HDL 42.50 03/18/2022   LDLCALC 71 03/18/2022   TRIG 153.0 (H) 03/18/2022   CHOLHDL 3 03/18/2022    Lab Results  Component Value Date   HGBA1C 7.5 (A) 07/14/2022    Assessment & Plan    1. Hypertension/orthostatic hypotension: Ongoing severe symptomatic orthostatic hypotension.  Will discontinue amlodipine. He will report BP consistently greater than 140/90 (some permissive hypertension given severe orthostasis). Discussed ongoing management with adequate hydration, gradual position changes, thigh sleeves and abdominal binder.  Patient verbalized understanding.  Also has noted some low blood sugars during these episodes.  His PCP recently discontinued his glimepiride. Discussed ED precautions.  Continue losartan,  metoprolol.  2. Nonobstructive CAD:  Coronary [facility] in 11/[facility] at the [facility] showed scattered plaque with mild stenosis in the LAD, moderate disease in the distal RCA, mild to moderate disease in the left circumflex with normal FFR, coronary calcium score of 340, dilated ascending aorta measuring 3.9 cm. Stable with no anginal symptoms. No indication for ischemic evaluation.  Continue aspirin, losartan, metoprolol, Lipitor.  3. Ascending aortic aneurysm: 3.9 cm on coronary CTA in 11/2021.  He is scheduled for repeat echo in 11/2022.  4. Hyperlipidemia: LDL was 71 in 03/2022.  Continue aspirin, Lipitor.  5. Type 2 diabetes: A1C was 7.5 in 06/2022.  Monitored and managed per PCP.  6. OSA: Adherent to CPAP.  7. Disposition: Follow-up with Pharm.D. as scheduled, follow-up with APP in 2 months.     Lenna Sciara, NP 07/19/2022, 2:08 PM

## 2022-07-19 NOTE — Patient Instructions (Signed)
Medication Instructions:  Stop Amlodipine as directed   *If you need a refill on your cardiac medications before your next appointment, please call your pharmacy*   Lab Work: NONE ordered at this time of appointment   If you have labs (blood work) drawn today and your tests are completely normal, you will receive your results only by: [place] (if you have MyChart) OR A paper copy in the mail If you have any lab test that is abnormal or we need to change your treatment, we will call you to review the results.   Testing/Procedures: NONE ordered at this time of appointment     Follow-Up: At [facility], you and your health needs are our priority.  As part of our continuing mission to provide you with exceptional heart care, we have created designated Provider Care Teams.  These Care Teams include your primary Cardiologist (physician) and Advanced Practice Providers (APPs -  Physician Assistants and Nurse Practitioners) who all work together to provide you with the care you need, when you need it.  We recommend signing up for the patient portal called "MyChart".  Sign up information is provided on this After Visit Summary.  MyChart is used to connect with patients for Virtual Visits (Telemedicine).  Patients are able to view lab/test results, encounter notes, upcoming appointments, etc.  Non-urgent messages can be sent to your provider as well.   To learn more about what you can do with MyChart, go to [email or web address].    Your next appointment:   2 month(s)  The format for your next appointment:   In Person  Provider:   Diona Browner, NP        Other Instructions Report blood pressure consistently greater than 130/90  Orthostatic Hypotension Blood pressure is a measurement of how strongly, or weakly, your circulating blood is pressing against the walls of your arteries. Orthostatic hypotension is a drop in blood pressure that can happen when you change  positions, such as when you go from lying down to standing. Arteries are blood vessels that carry blood from your heart throughout your body. When blood pressure is too low, you may not get enough blood to your brain or to the rest of your organs. Orthostatic hypotension can cause light-headedness, sweating, rapid heartbeat, blurred vision, and fainting. These symptoms require further investigation into the cause. What are the causes? Orthostatic hypotension can be caused by many things, including: Sudden changes in posture, such as standing up quickly after you have been sitting or lying down. Loss of blood (anemia) or loss of body fluids (dehydration). Heart problems, neurologic problems, or hormone problems. Pregnancy. Aging. The risk for this condition increases as you get older. Severe infection (sepsis). Certain medicines, such as medicines for high blood pressure or medicines that make the body lose excess fluids (diuretics). What are the signs or symptoms? Symptoms of this condition may include: Weakness, light-headedness, or dizziness. Sweating. Blurred vision. Tiredness (fatigue). Rapid heartbeat. Fainting, in severe cases. How is this diagnosed? This condition is diagnosed based on: Your symptoms and medical history. Your blood pressure measurements. Your health care provider will check your blood pressure when you are: Lying down. Sitting. Standing. A blood pressure reading is recorded as two numbers, such as "120 over 80" (or 120/80). The first ("top") number is called the systolic pressure. It is a measure of the pressure in your arteries as your heart beats. The second ("bottom") number is called the diastolic pressure. It is a measure of  the pressure in your arteries when your heart relaxes between beats. Blood pressure is measured in a unit called mmHg. Healthy blood pressure for most adults is 120/80 mmHg. Orthostatic hypotension is defined as a 20 mmHg drop in systolic  pressure or a 10 mmHg drop in diastolic pressure within 3 minutes of standing. Other information or tests that may be used to diagnose orthostatic hypotension include: Your other vital signs, such as your heart rate and temperature. Blood tests. An electrocardiogram (ECG) or echocardiogram. A Holter monitor. This is a device you wear that records your heart rhythm continuously, usually for 24-48 hours. Tilt table test. For this test, you will be safely secured to a table that moves you from a lying position to an upright position. Your heart rhythm and blood pressure will be monitored during the test. How is this treated? This condition may be treated by: Changing your diet. This may involve eating more salt (sodium) or drinking more water. Changing the dosage of certain medicines you are taking that might be lowering your blood pressure. Correcting the underlying reason for the orthostatic hypotension. Wearing compression stockings. Taking medicines to raise your blood pressure. Avoiding actions that trigger symptoms. Follow these instructions at home: Medicines Take over-the-counter and prescription medicines only as told by your health care provider. Follow instructions from your health care provider about changing the dosage of your current medicines, if this applies. Do not stop or adjust any of your medicines on your own. Eating and drinking  Drink enough fluid to keep your urine pale yellow. Eat extra salt only as directed. Do not add extra salt to your diet unless advised by your health care provider. Eat frequent, small meals. Avoid standing up suddenly after eating. General instructions  Get up slowly from lying down or sitting positions. This gives your blood pressure a chance to adjust. Avoid hot showers and excessive heat as directed by your health care provider. Engage in regular physical activity as directed by your health care provider. If you have compression  stockings, wear them as told. Keep all follow-up visits. This is important. Contact a health care provider if: You have a fever for more than 2-3 days. You feel more thirsty than usual. You feel dizzy or weak. Get help right away if: You have chest pain. You have a fast or irregular heartbeat. You become sweaty or feel light-headed. You feel short of breath. You faint. You have any symptoms of a stroke. "BE FAST" is an easy way to remember the main warning signs of a stroke: B - Balance. Signs are dizziness, sudden trouble walking, or loss of balance. E - Eyes. Signs are trouble seeing or a sudden change in vision. F - Face. Signs are sudden weakness or numbness of the face, or the face or eyelid drooping on one side. A - Arms. Signs are weakness or numbness in an arm. This happens suddenly and usually on one side of the body. S - Speech. Signs are sudden trouble speaking, slurred speech, or trouble understanding what people say. T - Time. Time to call emergency services. Write down what time symptoms started. You have other signs of a stroke, such as: A sudden, severe headache with no known cause. Nausea or vomiting. Seizure. These symptoms may represent a serious problem that is an emergency. Do not wait to see if the symptoms will go away. Get medical help right away. Call your local emergency services (911 in the [place] Do not drive yourself to the  hospital. Summary Orthostatic hypotension is a sudden drop in blood pressure. It can cause light-headedness, sweating, rapid heartbeat, blurred vision, and fainting. Orthostatic hypotension can be diagnosed by having your blood pressure taken while lying down, sitting, and then standing. Treatment may involve changing your diet, wearing compression stockings, sitting up slowly, adjusting your medicines, or correcting the underlying reason for the orthostatic hypotension. Get help right away if you have chest pain, a fast or irregular  heartbeat, or symptoms of a stroke. This information is not intended to replace advice given to you by your health care provider. Make sure you discuss any questions you have with your health care provider. Document Revised: 10/16/2020 Document Reviewed: 10/16/2020 Elsevier Patient Education  [place].  May add abdominal binder and thigh sleeve.   Important Information About Sugar

## 2022-07-20 ENCOUNTER — Other Ambulatory Visit: Payer: Self-pay | Admitting: Family Medicine

## 2022-07-20 DIAGNOSIS — E875 Hyperkalemia: Secondary | ICD-10-CM

## 2022-07-20 NOTE — Progress Notes (Signed)
See lab results.  Hyperkalemia, repeat labs at lab only visit.

## 2022-07-22 MED ORDER — AMLODIPINE BESYLATE 2.5 MG PO TABS: 2.5000 mg | ORAL_TABLET | Freq: Every day | ORAL | 3 refills | Status: DC

## 2022-07-22 NOTE — Telephone Encounter (Signed)
Patient is following up, requesting to speak directly with Diona Browner, NP regarding BP readings if at all possible.

## 2022-07-28 ENCOUNTER — Ambulatory Visit: Payer: Medicare HMO | Admitting: Family Medicine

## 2022-08-02 ENCOUNTER — Encounter: Payer: Self-pay | Admitting: Family Medicine

## 2022-08-02 ENCOUNTER — Ambulatory Visit (INDEPENDENT_AMBULATORY_CARE_PROVIDER_SITE_OTHER): Payer: Medicare HMO | Admitting: Family Medicine

## 2022-08-02 VITALS — Temp 97.9°F

## 2022-08-02 DIAGNOSIS — E1169 Type 2 diabetes mellitus with other specified complication: Secondary | ICD-10-CM

## 2022-08-02 DIAGNOSIS — I951 Orthostatic hypotension: Secondary | ICD-10-CM | POA: Diagnosis not present

## 2022-08-02 DIAGNOSIS — E669 Obesity, unspecified: Secondary | ICD-10-CM | POA: Diagnosis not present

## 2022-08-02 DIAGNOSIS — C61 Malignant neoplasm of prostate: Secondary | ICD-10-CM | POA: Insufficient documentation

## 2022-08-02 NOTE — Progress Notes (Unsigned)
Subjective:  Patient ID: Jason [name], [name]    DOB: [birth year]  Age: 86 y.o. MRN: [id number]  CC:  Chief Complaint  Patient presents with   Hypertension    Pt states the cardiologist chanes his bp meds again, pt states his bp has been all over the place and he is dizzy    Diabetes    Pt states all is well    HPI Jason [name] presents for  Follow-up from November 2019 visit  Hypotension  multiple episodes discussed at his last visit.  However he also reported some elevated readings in the 160/80 range at times.  He was taking metoprolol 12.5 mg twice daily, amlodipine 5 mg daily and losartan 25 mg twice daily at that time (dosage was lowered by cardiology in August).  Had denied any additional doses or missed doses.  Was eating regular meals and drinking fluids with electrolytes at times. Last visit blood pressure 100/70, 130/80, then orthostatic with drop to 88/62 standing.  Given variability in blood pressures and previous evaluation by cardiology, asked that he call his cardiologist to discuss medication changes as possible need for echo based on August 28 visit.  Note reviewed from cardiology December 4.  Symptomatic orthostatic hypotension discussed.  Amlodipine discontinued.  Permissive hypertension as option given severe orthostasis.  Coronary CTA in April through the [place] with scattered plaque, moderate disease in distal RCA, mild stenosis in LAD, mild to moderate disease in left circumflex with normal FFR, and was thought to be stable without anginal symptoms, no indication for ischemic evaluation and was continued on aspirin, losartan, metoprolol, Lipitor.  Note from patient on December 7 to cardiology, blood pressures were reading higher at 160/96 up to 184/100.  Added back amlodipine at 2.5 mg daily.  Plan for pharmacist follow-up on the 27th.   Still had symptomatic orthostasis (92/56) when off amlodipine, but elevated readings as above necessitating restart at 1/2 dose.  Still  lightheaded with standing. Overall feels about the same back on 1/2 dose amlodipine. 163/98 this morning. 146/92 yesterday (seated).  Nearsyncope, unsteady with standing, no syncope.  No chest pain.  No falls, has cane to stabilize.  Eating and drinking ok.  Has not tracked amount. 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  No melena/hematochezia, abd pain, focal weakness.   BP today - 138/88 sitting, 160/86 supine, 80/60 standing - felt lightheaded.   Diabetes With hypoglycemia, discussed last visit.  Readings down to 60, 70.  Usual blood sugars 110-120.  We stopped the sulfonylurea and continue just metformin and Actos. No further lows off the glimepiride.  Around 140 usually.  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   Gastroesophageal reflux disease 02/22/2022   Benign paroxysmal positional vertigo 02/22/2022   CAD in native artery 01/22/2022   Thoracic aortic aneurysm, without rupture, unspecified ([place]) 01/22/2022   Gait instability 01/22/2022   Essential hypertension 01/17/2022   Type 2 diabetes mellitus without complications ([place]) 77/82/4235   HLD (hyperlipidemia) 01/17/2022   Obstructive sleep apnea 01/17/2022   Sensorineural  hearing loss, bilateral 11/09/2021   Orthostatic hypotension 11/09/2021   Imbalance 11/09/2021   Overweight 02/21/2020   First degree heart block 02/21/2020   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   Malignant tumor of prostate ([place]) 02/21/2020   Vitamin D deficiency 05/31/2016    History of malignant neoplasm of prostate 05/31/2016   History of malignant neoplasm of skin 05/31/2016   Bilateral hearing loss 05/31/2016   Anxiety state 05/31/2016   Bronchospasm 09/24/2010   Cough 09/19/2010   Acute bronchitis 09/19/2010   Allergic rhinitis due to pollen 09/19/2010   Acute sinusitis 09/19/2010   Past Medical History:  Diagnosis Date   Ascending aortic aneurysm ([place]) 01/22/2022   CAD in native artery 01/22/2022   Cancer [facility])    Cataract    Diabetes mellitus without complication ([place])    Gait instability 01/22/2022   Hypertension    Sleep apnea    No past surgical history on file. Allergies  Allergen Reactions   Lisinopril Cough, Swelling and Other (See Comments)   Prior to Admission medications   Medication Sig Start Date End Date Taking? Authorizing Provider  allopurinol (ZYLOPRIM) 300 MG tablet TAKE 1 TABLET BY MOUTH EVERY DAY 06/24/22  Yes Jason Agreste, MD  amLODipine (NORVASC) 2.5 MG tablet Take 1 tablet (2.5 mg total) by mouth daily. 07/22/22 11/19/22 Yes [name], Jason Gunther, NP  aspirin EC 81 MG tablet Take 1 tablet every day by oral route. 02/21/20  Yes [provider]  atorvastatin (LIPITOR) 40 MG tablet TAKE 1 TABLET BY MOUTH EVERY DAY 06/24/22  Yes Jason Agreste, MD  losartan (COZAAR) 25 MG tablet TAKE 1 TABLET (25 MG) BY MOUTH IN THE MORNING AND AT BEDTIME 06/24/22  Yes Jason Latch, MD  meclizine (ANTIVERT) 12.5 MG tablet TAKE 1 TABLET (12.5 MG TOTAL) BY MOUTH IN THE MORNING, AT NOON, AND AT BEDTIME. 05/26/22  Yes Jason Agreste, MD  metFORMIN (GLUCOPHAGE) 500 MG tablet TAKE 2 TABLETS (1,000 MG TOTAL) BY MOUTH 2 (TWO) TIMES DAILY WITH A MEAL. 07/07/22  Yes Jason Agreste, MD  metoprolol tartrate (LOPRESSOR) 25 MG tablet Take 12.5 mg by mouth 2 (two) times daily. 1/2 tablet 2 times daily 02/22/22  Yes Jason Latch, MD  omeprazole (PRILOSEC) 20 MG capsule Take 1 capsule (20 mg total) by mouth daily. 03/04/22  Yes Jason Agreste, MD   pioglitazone (ACTOS) 15 MG tablet TAKE 1 TABLET (15 MG TOTAL) BY MOUTH DAILY. 06/24/22  Yes Jason Agreste, MD  venlafaxine (EFFEXOR) 75 MG tablet TAKE 1 TABLET BY MOUTH EVERY DAY 03/29/22  Yes Jason Agreste, MD   Social History   Socioeconomic History   Marital status: Married    Spouse name: Not on file   Number of children: Not on file   Years of education: Not on file   Highest education level: Not on file  Occupational History   Not on file  Tobacco Use   Smoking status: Former    Types: Pipe   Smokeless tobacco: Never  Substance and Sexual Activity   Alcohol use: Yes    Alcohol/week: 2.0 standard drinks of alcohol    Types: 2 Glasses of wine per week   Drug use: Never   Sexual activity: Never  Other Topics Concern   Not on file  Social History Narrative   Not on file   Social Determinants of Health   Financial Resource Strain: Low Risk  (12/09/2021)   Overall Financial Resource Strain (CARDIA)  Difficulty of Paying Living Expenses: Not hard at all  Food Insecurity: No Food Insecurity (12/09/2021)   Hunger Vital Sign    Worried About Running Out of Food in the Last Year: Never true    Ran Out of Food in the Last Year: Never true  Transportation Needs: No Transportation Needs (12/09/2021)   PRAPARE - Hydrologist (Medical): No    Lack of Transportation (Non-Medical): No  Physical Activity: Insufficiently Active (12/09/2021)   Exercise Vital Sign    Days of Exercise per Week: 2 days    Minutes of Exercise per Session: 30 min  Stress: No Stress Concern Present (12/09/2021)   [place]    Feeling of Stress : Not at all  Social Connections: Moderately Integrated (12/09/2021)   Social Connection and Isolation Panel [NHANES]    Frequency of Communication with Friends and Family: Three times a week    Frequency of Social Gatherings with Friends and Family: Three times a  week    Attends Religious Services: Never    Active Member of Clubs or Organizations: Yes    Attends Archivist Meetings: More than 4 times per year    Marital Status: Married  Human resources officer Violence: Not At Risk (12/09/2021)   Humiliation, Afraid, Rape, and Kick questionnaire    Fear of Current or Ex-Partner: No    Emotionally Abused: No    Physically Abused: No    Sexually Abused: No    Review of Systems  Constitutional:  Negative for fatigue and unexpected weight change.  Eyes:  Negative for visual disturbance.  Respiratory:  Negative for cough, chest tightness and shortness of breath.   Cardiovascular:  Negative for chest pain, palpitations and leg swelling.  Gastrointestinal:  Negative for abdominal pain and blood in stool.  Neurological:  Positive for light-headedness. Negative for dizziness and headaches.    Objective:   Vitals:   08/02/22 1040 08/02/22 1053 08/02/22 1054 08/02/22 1055  Temp: 97.9 F (36.6 C)     SpO2: 96% 96% 96% 96%     Physical Exam Vitals reviewed.  Constitutional:      Appearance: Normal appearance. He is well-developed. He is not ill-appearing or diaphoretic.  HENT:     Head: Normocephalic and atraumatic.  Neck:     Vascular: No carotid bruit or JVD.  Cardiovascular:     Rate and Rhythm: Normal rate and regular rhythm.     Heart sounds: Normal heart sounds. No murmur heard. Pulmonary:     Effort: Pulmonary effort is normal.     Breath sounds: Normal breath sounds. No rales.  Musculoskeletal:     Right lower leg: No edema.     Left lower leg: No edema.  Skin:    General: Skin is warm and dry.  Neurological:     Mental Status: He is alert and oriented to person, place, and time.  Psychiatric:        Mood and Affect: Mood normal.        Assessment & Plan:  Jason [name] is a 86 y.o. [name] . Type 2 diabetes mellitus with obesity (HCC)  Orthostatic hypotension   No orders of the defined types were placed in this  encounter.  Patient Instructions  Track actual ounces of fluids obtained in 1 day. Should be 48-64 ounce per day. Water is best.  Will need to discuss plan with cardiology.   For diabetes, glad to hear  no further low blood sugars. Will not restart the glimepiride.   Recheck in 6 weeks.  Return to the clinic or go to the nearest emergency room if any of your symptoms worsen or new symptoms occur.     Signed,   Merri Ray, MD [place] 08/02/22 11:23 AM

## 2022-08-02 NOTE — Patient Instructions (Addendum)
Track actual ounces of fluids obtained in 1 day. Should be 48-64 ounce per day. Water is best.  Will need to discuss plan with cardiology.   For diabetes, glad to hear no further low blood sugars. Will not restart the glimepiride at this point.   Recheck in 6 weeks.  Return to the clinic or go to the nearest emergency room if any of your symptoms worsen or new symptoms occur.

## 2022-08-04 ENCOUNTER — Encounter: Payer: Self-pay | Admitting: Family Medicine

## 2022-08-11 ENCOUNTER — Encounter: Payer: Self-pay | Admitting: Pharmacist

## 2022-08-11 ENCOUNTER — Ambulatory Visit: Payer: Medicare HMO | Attending: Cardiovascular Disease | Admitting: Pharmacist

## 2022-08-11 VITALS — BP 122/72 | HR 70

## 2022-08-11 DIAGNOSIS — I951 Orthostatic hypotension: Secondary | ICD-10-CM

## 2022-08-11 DIAGNOSIS — E119 Type 2 diabetes mellitus without complications: Secondary | ICD-10-CM | POA: Insufficient documentation

## 2022-08-11 DIAGNOSIS — E1121 Type 2 diabetes mellitus with diabetic nephropathy: Secondary | ICD-10-CM | POA: Insufficient documentation

## 2022-08-11 DIAGNOSIS — G4733 Obstructive sleep apnea (adult) (pediatric): Secondary | ICD-10-CM | POA: Insufficient documentation

## 2022-08-11 DIAGNOSIS — E785 Hyperlipidemia, unspecified: Secondary | ICD-10-CM | POA: Insufficient documentation

## 2022-08-11 DIAGNOSIS — N1831 Chronic kidney disease, stage 3a: Secondary | ICD-10-CM | POA: Insufficient documentation

## 2022-08-11 DIAGNOSIS — E669 Obesity, unspecified: Secondary | ICD-10-CM | POA: Insufficient documentation

## 2022-08-11 DIAGNOSIS — I10 Essential (primary) hypertension: Secondary | ICD-10-CM | POA: Diagnosis not present

## 2022-08-11 DIAGNOSIS — F419 Anxiety disorder, unspecified: Secondary | ICD-10-CM | POA: Insufficient documentation

## 2022-08-11 MED ORDER — MIDODRINE HCL 10 MG PO TABS: ORAL_TABLET | ORAL | 2 refills | Status: DC

## 2022-08-11 NOTE — Patient Instructions (Addendum)
It was nice meeting you today  Please continue your losartan '25mg'$  twice a day and your metoprolol 12.'5mg'$  twice a day  We can discontinue amlodipine at this time  I would like to start a different medication to try to keep your blood pressure from dropping.  It is called midodrine and I would like you take one tablet when you wake up, one around noon, and one around 4pm.    Continue to check your blood pressure and pulse rate at home  We will see you back in a few weeks  Karren Cobble, PharmD, [place], [place], [place], [place], [place], [address] Phone: [phone], Fax: [phone]

## 2022-08-11 NOTE — Progress Notes (Signed)
Patient ID: Jason [name]                 DOB: 10-11-1935                      MRN: [id number]     HPI: Jason [name] is a 86 y.o. male referred by Diona Browner to HTN clinic. PMH is significant for CAD, T2DM, CKD, HTN and orthostatic hypotension.  Seen by Diona Browner on 07/19/22 and reported dizziness and low BP. Amlodipine was discontinued.  Patient was educated on lifestyle changes such as drinking more water and wearing compression stockings and abdominal binders.  Patient called back on 12/7 and reported hypertension. Low dose amlodipine was restarted at 2.'5mg'$ .  Saw Dr Carlota Raspberry on 12/18 with similar symptoms. Glimepiride was previously discontinued due to hypoglycemia which has resolved.  Patient presents today with dizziness and lightheadedness. Difficulty orienting himself from sitting in waiting room chair to standing position. Walks with a cane.  Took him a few minutes while in exam room chair to feel normal.  Had an Ensure this morning for breakfast but did not take any BP meds out of fear of hypotension.  Checked blood glucose in room to confirm patient not having hypoglycemic episode: 209.   Reports he feels fine driving. Home life has become more challenging recently as wife had knee surgery and is having trouble recovering. Is wearing compression stockings and has increased how much water he is drinking. Also purchased an electrolyte powder to add to his water.  He has fallen twice in the past 2 weeks. Last time he was unable to get up and was able to call his children to help him.  Current HTN meds:  Amlodipine 2.'5mg'$  daily in the evening Losartan '25mg'$  twice a day Metoprolol 12.'5mg'$  BID   Wt Readings from Last 3 Encounters:  07/19/22 275 lb 3.2 oz (124.8 kg)  07/14/22 278 lb 3.2 oz (126.2 kg)  04/12/22 277 lb 4.8 oz (125.8 kg)   BP Readings from Last 3 Encounters:  07/19/22 126/72  07/14/22 128/60  04/12/22 113/67   Pulse Readings from Last 3 Encounters:  07/19/22 76  07/14/22  75  04/12/22 69    Renal function: CrCl cannot be calculated (Patient's most recent lab result is older than the maximum 21 days allowed.).  Past Medical History:  Diagnosis Date   Ascending aortic aneurysm ([place]) 01/22/2022   CAD in native artery 01/22/2022   Cancer [facility])    Cataract    Diabetes mellitus without complication ([place])    Gait instability 01/22/2022   Hypertension    Sleep apnea     Current Outpatient Medications on File Prior to Visit  Medication Sig Dispense Refill   Multiple Vitamins-Minerals (ICAPS AREDS 2 PO) TAKE 1 CAPSULE BY MOUTH TWICE A DAY FOR MACULAR DEGENERATION     allopurinol (ZYLOPRIM) 300 MG tablet TAKE 1 TABLET BY MOUTH EVERY DAY 90 tablet 1   amLODipine (NORVASC) 2.5 MG tablet Take 1 tablet (2.5 mg total) by mouth daily. 30 tablet 3   aspirin EC 81 MG tablet Take 1 tablet every day by oral route.     atorvastatin (LIPITOR) 40 MG tablet TAKE 1 TABLET BY MOUTH EVERY DAY 90 tablet 1   losartan (COZAAR) 25 MG tablet TAKE 1 TABLET (25 MG) BY MOUTH IN THE MORNING AND AT BEDTIME 180 tablet 1   meclizine (ANTIVERT) 12.5 MG tablet TAKE 1 TABLET (12.5 MG TOTAL) BY MOUTH IN THE MORNING,  AT NOON, AND AT BEDTIME. 90 tablet 1   metFORMIN (GLUCOPHAGE) 500 MG tablet TAKE 2 TABLETS (1,000 MG TOTAL) BY MOUTH 2 (TWO) TIMES DAILY WITH A MEAL. 180 tablet 1   metoprolol tartrate (LOPRESSOR) 25 MG tablet Take 12.5 mg by mouth 2 (two) times daily. 1/2 tablet 2 times daily 180 tablet 3   omeprazole (PRILOSEC) 20 MG capsule Take 1 capsule (20 mg total) by mouth daily. 90 capsule 1   pioglitazone (ACTOS) 15 MG tablet TAKE 1 TABLET (15 MG TOTAL) BY MOUTH DAILY. 90 tablet 1   venlafaxine (EFFEXOR) 75 MG tablet TAKE 1 TABLET BY MOUTH EVERY DAY 90 tablet 1   No current facility-administered medications on file prior to visit.    Allergies  Allergen Reactions   Lisinopril Cough, Swelling and Other (See Comments)     Assessment/Plan:  1. Orthostatic hypotension - Patient BP while  sitting 137/80 and rechecked 10 minutes later 122/72. Had patient stand: 82/54. Patient visibly unstable while standing.  Has already made lifestyle changes such as stockings and drinking more water. Is not hypoglycemic. BG 209.  Will discontinue amlodipine at this time. Likely will need further medications discontinued or dosages reduced but will taper slowly and for now continue losartan and metoprolol due to DM and CAD.  Will start midodrine '10mg'$  TID: before rising out of bed or soon after, at noon, and around 4pm. Advised to continue to monitor home BP readings and pulse rates.    Patient will call or message if symptoms improve or remain the same.   Start midodrine '10mg'$  TID Continue losartan '25mg'$  BID Continue metoprolol 12.'5mg'$  BID Recheck in 2-3 weeks  Karren Cobble, PharmD, [place], [place], [place], [place], [place], [address] Phone: [phone], Fax: [phone]

## 2022-08-12 ENCOUNTER — Encounter: Payer: Self-pay | Admitting: Pharmacist

## 2022-08-12 DIAGNOSIS — E119 Type 2 diabetes mellitus without complications: Secondary | ICD-10-CM

## 2022-08-12 MED ORDER — ONETOUCH DELICA LANCETS 33G MISC: 5 refills | Status: DC

## 2022-08-12 MED ORDER — GLUCOSE BLOOD VI STRP: ORAL_STRIP | 12 refills | Status: AC

## 2022-08-13 IMAGING — DX DG CHEST 2V
2 series · 2 of 2 positions shown · non-contrast
Comparison: 12/10/2021

CLINICAL DATA: Follow-up pneumonia.

EXAM:
CHEST - 2 VIEW

[chest pa]
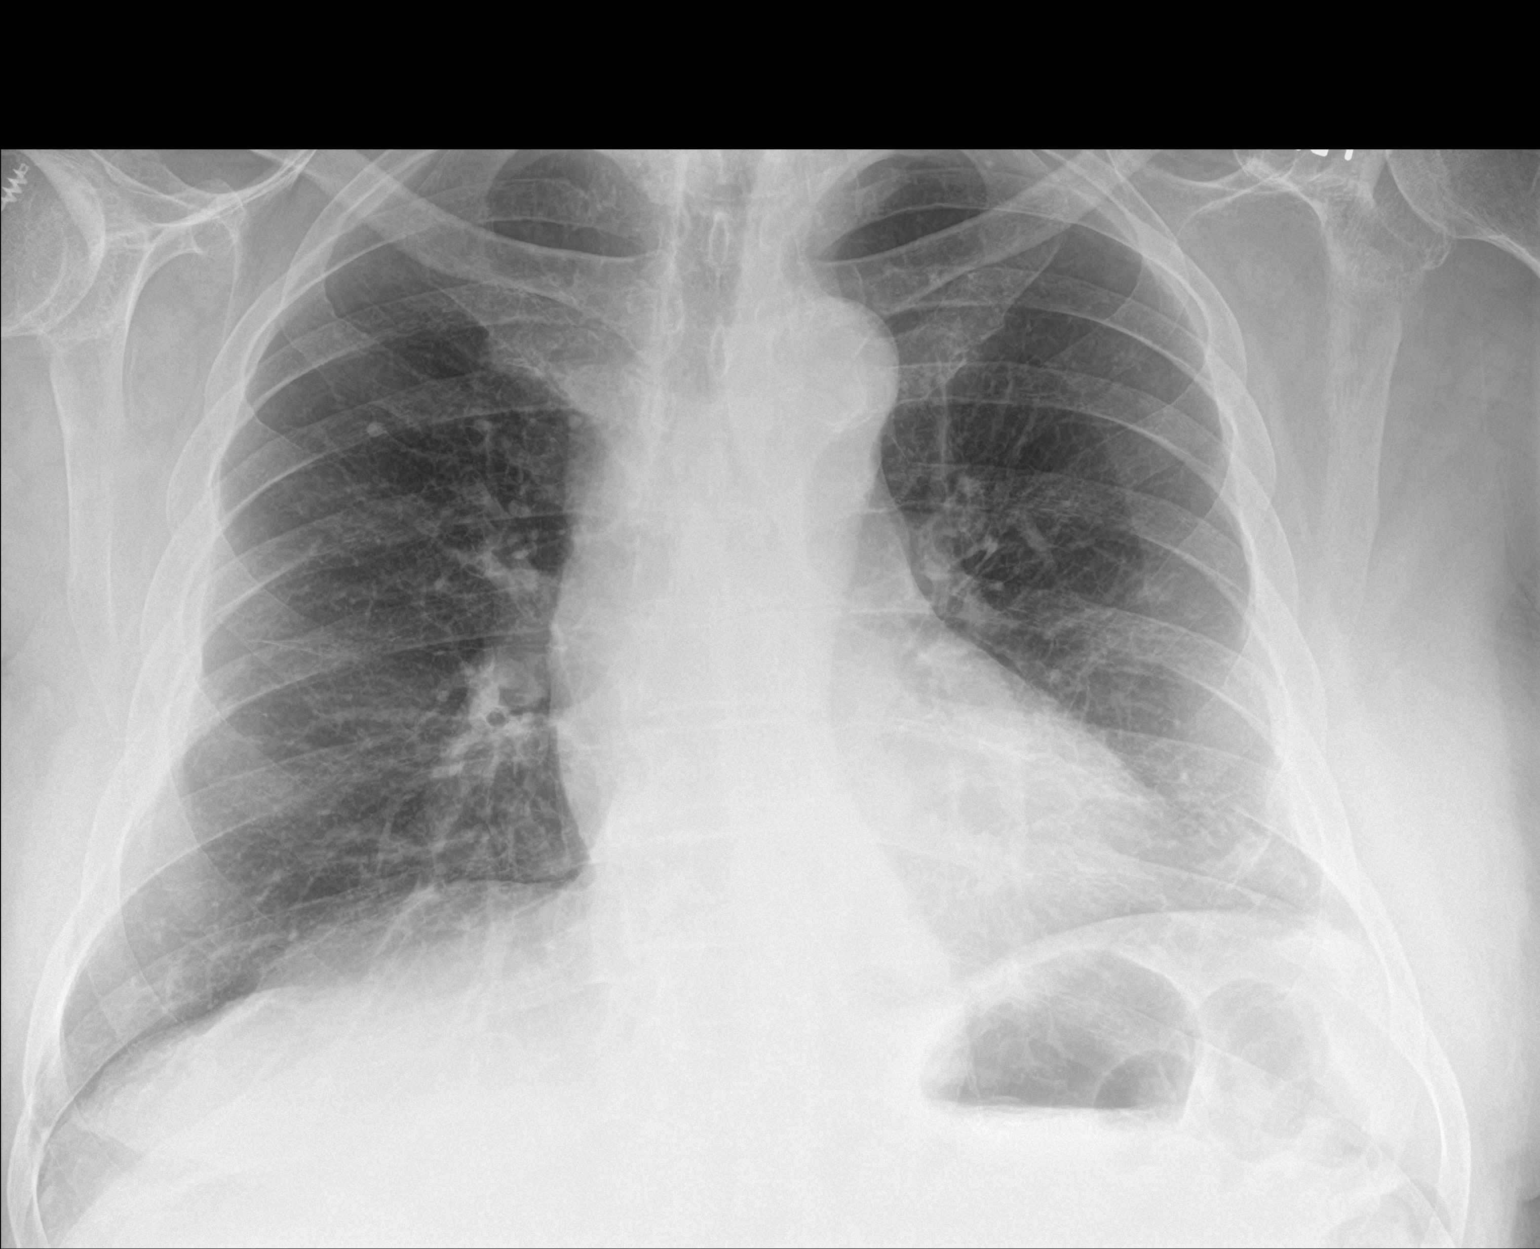

[chest lat]
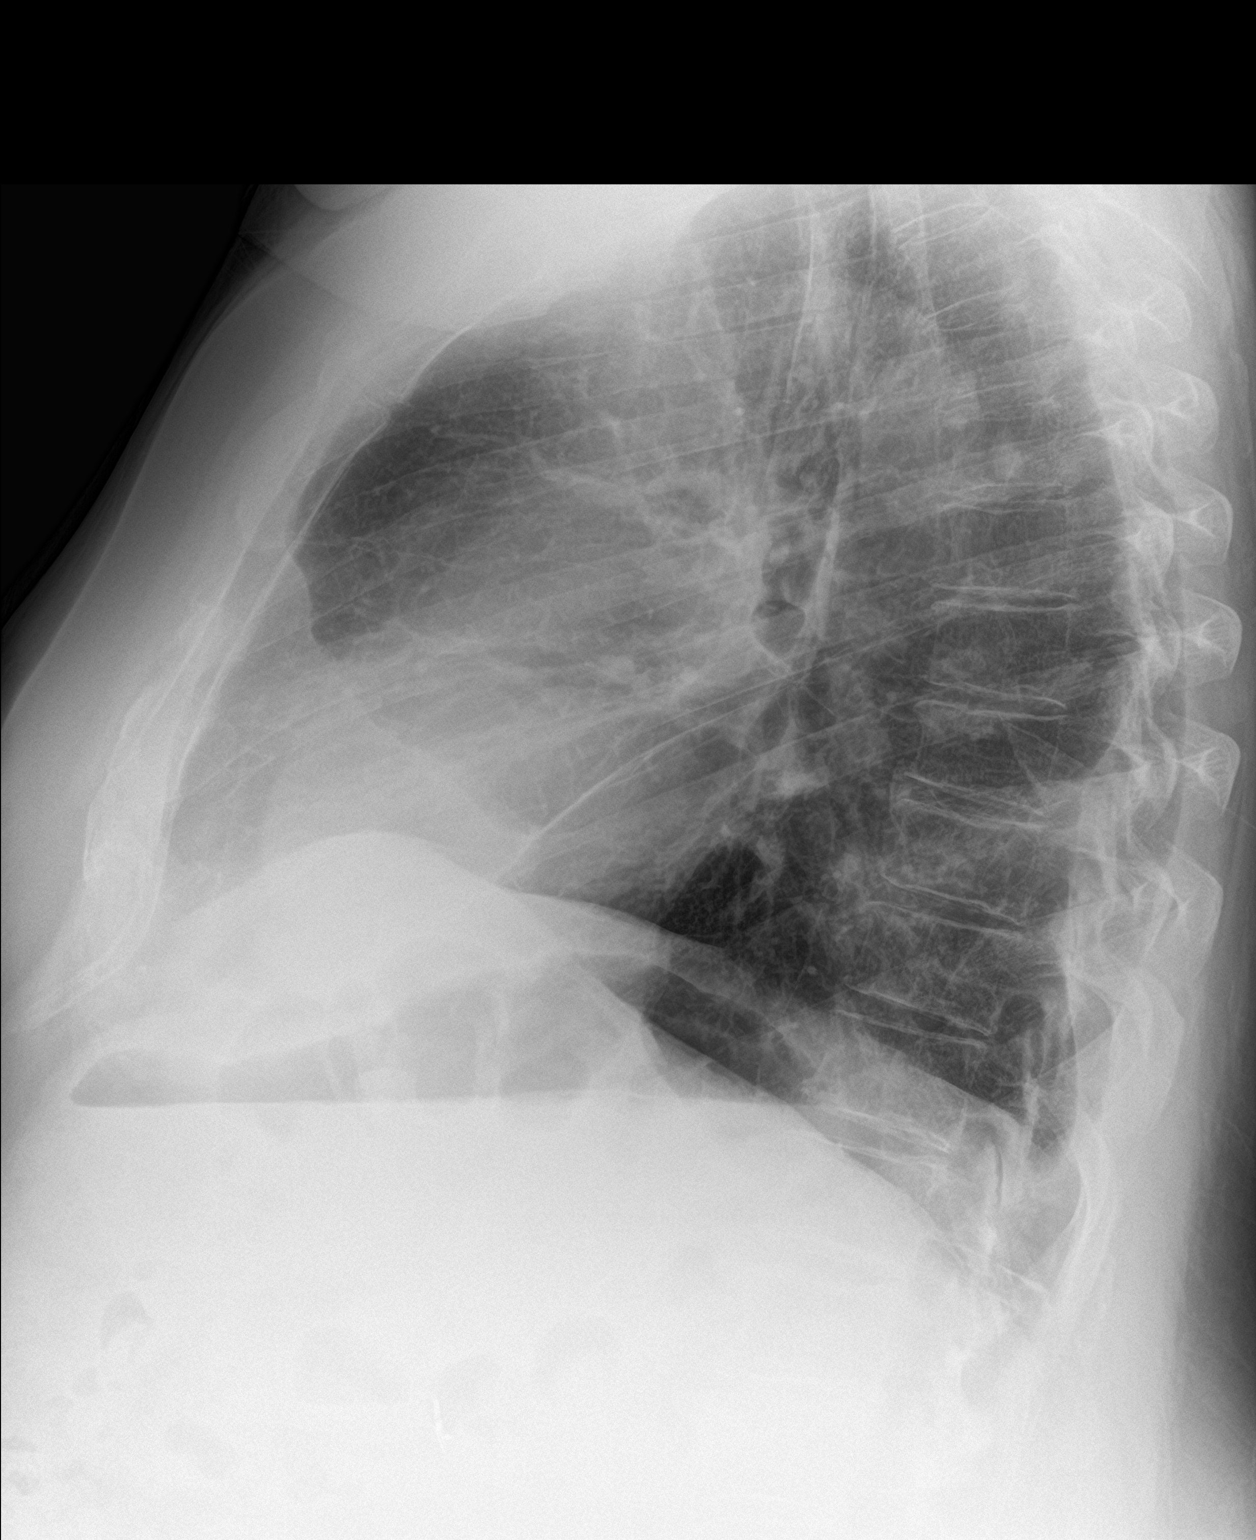

[2 of 2 positions shown; findings below may reference images not displayed]

FINDINGS: The cardiac silhouette, mediastinal and hilar contours are within
normal limits and stable. Stable mild tortuosity and calcification
of the thoracic aorta.

Stable left basilar scarring changes. No acute pulmonary findings.
No pulmonary lesions. The bony thorax is intact.
IMPRESSION: No acute cardiopulmonary findings. Stable left basilar scarring
changes.

## 2022-08-15 ENCOUNTER — Encounter: Payer: Self-pay | Admitting: Family Medicine

## 2022-08-17 MED ORDER — GLIMEPIRIDE 1 MG PO TABS: 1.0000 mg | ORAL_TABLET | Freq: Every day | ORAL | 0 refills | Status: DC

## 2022-08-17 NOTE — Telephone Encounter (Signed)
Left pt a VM stating  Dr Vonna Kotyk message to check his blood sugars twice a day and let us know in a week of the readings .That if any low blood sugars start to stop the Glimepiride

## 2022-08-17 NOTE — Telephone Encounter (Signed)
Okay.  I have ordered low-dose glimepiride once per day, but he has dropped low on higher doses of that  medicine in the past.  Check blood sugar at least 2 times per day and if any low blood sugar symptoms or lows stop glimepiride right away.  Give me an update on home readings in the next week and we can make further changes.  Thanks.

## 2022-08-17 NOTE — Telephone Encounter (Signed)
Noted.  I am concerned about adding additional medication with history of low blood sugars.  I looked at the cardiology note and they did not change any of his diabetic medications.  Please verify that he is still taking the metformin 1000 mg twice per day as well as the Actos 15 mg a day.  If so, then we can discuss possible additional medication with close monitoring of blood sugar.  Let me know.  Thanks.

## 2022-08-17 NOTE — Telephone Encounter (Signed)
Spoke with pt notes Cards recently changed some meds, advised he call and speak with him about these spikes if they made changes but that I would also forward them to PCP

## 2022-08-17 NOTE — Telephone Encounter (Signed)
Pt confirmed he has been taking both Metformin and the Actos as directed stopped amlodipine as directed by cardiology   Please advise adjustment/additional meds

## 2022-08-19 NOTE — Telephone Encounter (Signed)
Noted  

## 2022-09-02 ENCOUNTER — Other Ambulatory Visit: Payer: Self-pay | Admitting: Family Medicine

## 2022-09-03 ENCOUNTER — Encounter: Payer: Self-pay | Admitting: Pharmacist Clinician (PhC)/ Clinical Pharmacy Specialist

## 2022-09-03 ENCOUNTER — Ambulatory Visit: Payer: Medicare HMO | Attending: Cardiology | Admitting: Pharmacist Clinician (PhC)/ Clinical Pharmacy Specialist

## 2022-09-03 VITALS — BP 102/65

## 2022-09-03 DIAGNOSIS — I951 Orthostatic hypotension: Secondary | ICD-10-CM | POA: Diagnosis not present

## 2022-09-03 NOTE — Progress Notes (Signed)
Office Visit    Patient Name: Jason [name] Date of Encounter: 09/03/2022  Primary Care Provider:  [name], MD Primary Cardiologist:  Skeet Latch, MD  Chief Complaint    Hypertension  Significant Past Medical History   CAD Non-obstructive, calcium score 340, no indication for ischemic evaluation  Aneurysm Ascending aortic - monitored annually  DM2 11/23 A1c 7.5  OSA Uses CPAP       Allergies  Allergen Reactions   Lisinopril Cough, Swelling and Other (See Comments)    History of Present Illness    Jason [name] is a 87 y.o. male patient of Dr Oval Linsey, in the office today to follow up on issues with orthostatic hypotension.  He was most recently seen by Karren Cobble PharmD and started on midodrine 10 mg tid.  Patient has done well with this, and has not had any significant orthostatic hypotension.  He reports only one fall since his last visit, was sitting on the edge of his bed and slipped off (more due to mattress weakness than anything he did).  He wears compression socks most days and is careful with making positional changes.  Does his best to stay hydrated.   He does not have an abdominal binder at this time.   Today he notes that when he started midodrine his blood sugars increased some and he had to go back on a low dose of glimepiride.   Overall he is feeling better, now that he is avoiding hypotensive episodes, although he still complains of weakness.  Admits today that he misses the afternoon midodrine dose a couple of times each week, but otherwise no compliance problems.    Blood Pressure Goal:  130/80  Current Medications:  midodrine 10 mg tid (7 am, noon, 4 pm)  Losartan 25 mg bid, metoprolol 12.5 mg bid  Previously tried:  amlodipine stopped 2/2 orthostatic hypotension  Social Hx:      Tobacco: no  Alcohol: occasional wine  Caffeine: 1 coffee/day  Diet: wife on Weight Watchers - he eats whatever she gives him   Exercise: equillibruim problems, not  able to exercise much   Home BP readings:  no readings with him today, but states at home mostly in the 035 systolic and 90 diastolic range.    Accessory Clinical Findings    Lab Results  Component Value Date   CREATININE 1.42 07/14/2022   BUN 32 (H) 07/14/2022   NA 138 07/14/2022   K 5.3 No hemolysis seen (H) 07/14/2022   CL 104 07/14/2022   CO2 27 07/14/2022   Lab Results  Component Value Date   ALT 16 03/18/2022   AST 17 03/18/2022   ALKPHOS 75 03/18/2022   BILITOT 0.4 03/18/2022   Lab Results  Component Value Date   HGBA1C 7.5 (A) 07/14/2022    Home Medications    Current Outpatient Medications  Medication Sig Dispense Refill   allopurinol (ZYLOPRIM) 300 MG tablet TAKE 1 TABLET BY MOUTH EVERY DAY 90 tablet 1   aspirin EC 81 MG tablet Take 1 tablet every day by oral route.     atorvastatin (LIPITOR) 40 MG tablet TAKE 1 TABLET BY MOUTH EVERY DAY 90 tablet 1   glimepiride (AMARYL) 1 MG tablet TAKE 1 TABLET BY MOUTH DAILY WITH BREAKFAST. 30 tablet 0   glucose blood test strip E11.9 Use to test blood sugar twice daily or as needed 100 each 12   losartan (COZAAR) 25 MG tablet TAKE 1 TABLET (25 MG) BY MOUTH  IN THE MORNING AND AT BEDTIME 180 tablet 1   meclizine (ANTIVERT) 12.5 MG tablet TAKE 1 TABLET (12.5 MG TOTAL) BY MOUTH IN THE MORNING, AT NOON, AND AT BEDTIME. 90 tablet 1   metFORMIN (GLUCOPHAGE) 500 MG tablet TAKE 2 TABLETS (1,000 MG TOTAL) BY MOUTH 2 (TWO) TIMES DAILY WITH A MEAL. 180 tablet 1   metoprolol tartrate (LOPRESSOR) 25 MG tablet Take 12.5 mg by mouth 2 (two) times daily. 1/2 tablet 2 times daily 180 tablet 3   midodrine (PROAMATINE) 10 MG tablet Take 1 tablet by mouth before getting out of bed or soon after, 1 tablet at noon, and 1 tablet around 4pm 90 tablet 2   Multiple Vitamins-Minerals (ICAPS AREDS 2 PO) TAKE 1 CAPSULE BY MOUTH TWICE A DAY FOR MACULAR DEGENERATION     omeprazole (PRILOSEC) 20 MG capsule Take 1 capsule (20 mg total) by mouth daily. 90  capsule 1   OneTouch Delica Lancets 17B MISC E11.9 Use to test blood sugar twice daily or as needed 100 each 5   pioglitazone (ACTOS) 15 MG tablet TAKE 1 TABLET (15 MG TOTAL) BY MOUTH DAILY. 90 tablet 1   venlafaxine (EFFEXOR) 75 MG tablet TAKE 1 TABLET BY MOUTH EVERY DAY 90 tablet 1   No current facility-administered medications for this visit.     Assessment & Plan    Orthostatic hypotension Assessment: BP is uncontrolled in office while seated; 152/82; dropped to 96/74 when stood up then 102/65 after 2 minutes standing Wearing compression socks most days of the week Tolerates midodrine well without any side effects Denies SOB, palpitation, chest pain, headaches,or swelling Only one recent fall - slipped off edge of bed  Plan:  Stop taking late afternoon dose of midodrine unless you have specifically low evening BP readings Continue taking all other medications Patient to keep record of BP readings with heart rate and report to [place] at the next visit Patient to follow up with Dr. Caryl Comes in February for follow up  Labs ordered today:  none   Tommy Medal PharmD CPP [place]  [address] [place], [place] [address] [phone]

## 2022-09-03 NOTE — Patient Instructions (Signed)
Follow up appointment: February 12 with Dr. Caryl Comes at the [facility] [address] office  Take your BP meds as follows:    Continue with morning and noon doses of midodrine, take the afternoon doses only if you see more low BP readings  Continue with all other medications.  Check your blood pressure at home daily (if able) and keep record of the readings.  Hypertension "High blood pressure"  Hypertension is often called "The Silent Killer." It rarely causes symptoms until it is extremely  high or has done damage to other organs in the body. For this reason, you should have your  blood pressure checked regularly by your physician. We will check your blood pressure  every time you see a provider at one of our offices.   Your blood pressure reading consists of two numbers. Ideally, blood pressure should be  below 120/80. The first ("top") number is called the systolic pressure. It measures the  pressure in your arteries as your heart beats. The second ("bottom") number is called the diastolic pressure. It measures the pressure in your arteries as the heart relaxes between beats.  The benefits of getting your blood pressure under control are enormous. A 10-point  reduction in systolic blood pressure can reduce your risk of stroke by 27% and heart failure by 28%  Your blood pressure goal is <140/90  To check your pressure at home you will need to:  1. Sit up in a chair, with feet flat on the floor and back supported. Do not cross your ankles or legs. 2. Rest your left arm so that the cuff is about heart level. If the cuff goes on your upper arm,  then just relax the arm on the table, arm of the chair or your lap. If you have a wrist cuff, we  suggest relaxing your wrist against your chest (think of it as Pledging the Flag with the  wrong arm).  3. Place the cuff snugly around your arm, about 1 inch above the crook of your elbow. The  cords should be inside the groove of your elbow.  4. Sit  quietly, with the cuff in place, for about 5 minutes. After that 5 minutes press the power  button to start a reading. 5. Do not talk or move while the reading is taking place.  6. Record your readings on a sheet of paper. Although most cuffs have a memory, it is often  easier to see a pattern developing when the numbers are all in front of you.  7. You can repeat the reading after 1-3 minutes if it is recommended  Make sure your bladder is empty and you have not had caffeine or tobacco within the last 30 min  Always bring your blood pressure log with you to your appointments. If you have not brought your monitor in to be double checked for accuracy, please bring it to your next appointment.  You can find a list of quality blood pressure cuffs at validatebp.org

## 2022-09-03 NOTE — Assessment & Plan Note (Signed)
Assessment: BP is uncontrolled in office while seated; 152/82; dropped to 96/74 when stood up then 102/65 after 2 minutes standing Wearing compression socks most days of the week Tolerates midodrine well without any side effects Denies SOB, palpitation, chest pain, headaches,or swelling Only one recent fall - slipped off edge of bed  Plan:  Stop taking late afternoon dose of midodrine unless you have specifically low evening BP readings Continue taking all other medications Patient to keep record of BP readings with heart rate and report to [place] at the next visit Patient to follow up with Dr. Caryl Comes in February for follow up  Labs ordered today:  none

## 2022-09-13 ENCOUNTER — Encounter: Payer: Self-pay | Admitting: Family Medicine

## 2022-09-13 ENCOUNTER — Ambulatory Visit (INDEPENDENT_AMBULATORY_CARE_PROVIDER_SITE_OTHER): Payer: Medicare HMO | Admitting: Family Medicine

## 2022-09-13 ENCOUNTER — Other Ambulatory Visit: Payer: Self-pay | Admitting: Family Medicine

## 2022-09-13 VITALS — BP 128/62 | HR 79 | Temp 97.7°F | Resp 18 | Ht 73.0 in | Wt 277.1 lb

## 2022-09-13 DIAGNOSIS — E1169 Type 2 diabetes mellitus with other specified complication: Secondary | ICD-10-CM

## 2022-09-13 DIAGNOSIS — I951 Orthostatic hypotension: Secondary | ICD-10-CM | POA: Diagnosis not present

## 2022-09-13 DIAGNOSIS — E669 Obesity, unspecified: Secondary | ICD-10-CM | POA: Diagnosis not present

## 2022-09-13 LAB — POCT GLUCOSE (DEVICE FOR HOME USE)
Glucose Fasting, POC: 177 mg/dL — AB (ref 70–99)
POC Glucose: 177 mg/dl — AB (ref 70–99)

## 2022-09-13 MED ORDER — GLIMEPIRIDE 1 MG PO TABS: 1.0000 mg | ORAL_TABLET | Freq: Every day | ORAL | 0 refills | Status: DC

## 2022-09-13 NOTE — Progress Notes (Signed)
Subjective:  Patient ID: Jason [name], male    DOB: April 12, 1936  Age: 87 y.o. MRN: [id number]  CC:  Chief Complaint  Patient presents with   Diabetes    Follow up  Pt states he has been doing well since the change on his  Glimepiride     HPI Jason [name] presents for   Diabetes: With history of hypoglycemia.  We have stopped sulfonylurea and continue just metformin and Actos, no further lows off glimepiride.  Borderline control with A1c in November.  Plan for recheck in next month. Pharmacy note reviewed, did have to restart glimepiride temporarily for some high blood sugars with starting midodrine. Has been back on glimepiride '1mg'$  qd.  No recent low blood sugars. 130-150. Still on actos and metformin. Doing well.   Lab Results  Component Value Date   HGBA1C 7.5 (A) 07/14/2022   HGBA1C 7.3 (H) 03/18/2022   HGBA1C 7.5 (A) 12/17/2021   Lab Results  Component Value Date   MICROALBUR 42.4 (H) 09/09/2021   LDLCALC 71 03/18/2022   CREATININE 1.42 07/14/2022   Orthostatic hypotension See prior visits, persistent orthostasis, also managed by cardiology.  Recommended tracking fluid intake at his last visit in December. He was started on midodrine by cardiology.  10 mg 3 times daily.  That has been helpful.  Most recent visit with pharmacist January 19 at cardiology.  Blood pressure 102/65 at that time.  Occasional afternoon missed dose but otherwise compliant with midodrine.  Late afternoon dose of midodrine was discontinued unless low evening blood pressure readings.  Appointment planned February 12 with cardiologist.  Doing ok without evening dose. No low blood pressure in evenings. No further low BP. No new weakness.  Wearing compression stockings.  BP Readings from Last 3 Encounters:  09/13/22 128/62  09/03/22 102/65  08/11/22 122/72      History Patient Active Problem List   Diagnosis Date Noted   Obstructive sleep apnea of adult 08/11/2022   Diabetic nephropathy with  proteinuria ([place]) 08/11/2022   Obesity 08/11/2022   Hyperlipidemia 08/11/2022   Chronic kidney disease, stage 3a ([place]) 08/11/2022   Anxiety disorder, unspecified 08/11/2022   Diabetes mellitus ([place]) 08/11/2022   Primary malignant neoplasm of prostate ([place]) 08/02/2022   Carcinoma of prostate ([place]) 08/02/2022   Shortness of breath 02/22/2022   Pure hypercholesterolemia 02/22/2022   Pain in joint, shoulder region 02/22/2022   Other malaise and fatigue 02/22/2022   Nonexudative age-related macular degeneration, bilateral, intermediate dry stage 02/22/2022   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   Gastroesophageal reflux disease 02/22/2022   Benign paroxysmal positional vertigo 02/22/2022   CAD in native artery 01/22/2022   Thoracic aortic aneurysm, without rupture, unspecified ([place]) 01/22/2022   Gait instability 01/22/2022   Essential hypertension 01/17/2022   Type 2 diabetes mellitus without complications ([place]) 94/80/1655   HLD (hyperlipidemia) 01/17/2022   Obstructive sleep apnea 01/17/2022   Sensorineural hearing loss, bilateral 11/09/2021   Orthostatic hypotension 11/09/2021   Imbalance 11/09/2021   Overweight 02/21/2020   First degree heart block 02/21/2020   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   Malignant tumor of prostate ([place]) 02/21/2020   Vitamin D deficiency 05/31/2016   History of malignant neoplasm of prostate 05/31/2016   History of malignant neoplasm of skin 05/31/2016   Bilateral hearing loss 05/31/2016   Anxiety state 05/31/2016   Bronchospasm 09/24/2010   Cough 09/19/2010   Acute bronchitis 09/19/2010   Allergic rhinitis due to pollen 09/19/2010   Acute sinusitis 09/19/2010   Past Medical  History:  Diagnosis Date   Ascending aortic aneurysm ([place]) 01/22/2022   CAD in native artery 01/22/2022   Cancer [facility])    Cataract    Diabetes mellitus without complication ([place])    Gait instability 01/22/2022   Hypertension    Sleep apnea    No past surgical history on file. Allergies   Allergen Reactions   Lisinopril Cough, Swelling and Other (See Comments)   Prior to Admission medications   Medication Sig Start Date End Date Taking? Authorizing Provider  allopurinol (ZYLOPRIM) 300 MG tablet TAKE 1 TABLET BY MOUTH EVERY DAY 06/24/22  Yes [name], MD  aspirin EC 81 MG tablet Take 1 tablet every day by oral route. 02/21/20  Yes [provider]  atorvastatin (LIPITOR) 40 MG tablet TAKE 1 TABLET BY MOUTH EVERY DAY 06/24/22  Yes [name], MD  glimepiride (AMARYL) 1 MG tablet TAKE 1 TABLET BY MOUTH DAILY WITH BREAKFAST. 09/03/22  Yes [name], MD  glucose blood test strip E11.9 Use to test blood sugar twice daily or as needed 08/12/22  Yes Pavero, Harrell Gave, RPH  losartan (COZAAR) 25 MG tablet TAKE 1 TABLET (25 MG) BY MOUTH IN THE MORNING AND AT BEDTIME 06/24/22  Yes Skeet Latch, MD  meclizine (ANTIVERT) 12.5 MG tablet TAKE 1 TABLET (12.5 MG TOTAL) BY MOUTH IN THE MORNING, AT NOON, AND AT BEDTIME. 05/26/22  Yes [name], MD  metFORMIN (GLUCOPHAGE) 500 MG tablet TAKE 2 TABLETS (1,000 MG TOTAL) BY MOUTH 2 (TWO) TIMES DAILY WITH A MEAL. 07/07/22  Yes [name], MD  metoprolol tartrate (LOPRESSOR) 25 MG tablet Take 12.5 mg by mouth 2 (two) times daily. 1/2 tablet 2 times daily 02/22/22  Yes Skeet Latch, MD  midodrine (PROAMATINE) 10 MG tablet Take 1 tablet by mouth before getting out of bed or soon after, 1 tablet at noon, and 1 tablet around 4pm 08/11/22  Yes Monge, Helane Gunther, NP  Multiple Vitamins-Minerals (ICAPS AREDS 2 PO) TAKE 1 CAPSULE BY MOUTH TWICE A DAY FOR MACULAR DEGENERATION 08/05/22  Yes [provider]  omeprazole (PRILOSEC) 20 MG capsule Take 1 capsule (20 mg total) by mouth daily. 03/04/22  Yes [name], MD  OneTouch Delica Lancets 70J MISC E11.9 Use to test blood sugar twice daily or as needed 08/12/22  Yes Pavero, Harrell Gave, RPH  pioglitazone (ACTOS) 15 MG tablet TAKE 1 TABLET (15 MG TOTAL) BY  MOUTH DAILY. 06/24/22  Yes [name], MD  venlafaxine (EFFEXOR) 75 MG tablet TAKE 1 TABLET BY MOUTH EVERY DAY 03/29/22  Yes [name], MD   Social History   Socioeconomic History   Marital status: Married    Spouse name: Not on file   Number of children: Not on file   Years of education: Not on file   Highest education level: Not on file  Occupational History   Not on file  Tobacco Use   Smoking status: Former    Types: Pipe   Smokeless tobacco: Never  Substance and Sexual Activity   Alcohol use: Yes    Alcohol/week: 2.0 standard drinks of alcohol    Types: 2 Glasses of wine per week   Drug use: Never   Sexual activity: Never  Other Topics Concern   Not on file  Social History Narrative   Not on file   Social Determinants of Health   Financial Resource Strain: Low Risk  (12/09/2021)   Overall Financial Resource Strain (CARDIA)  Difficulty of Paying Living Expenses: Not hard at all  Food Insecurity: No Food Insecurity (12/09/2021)   Hunger Vital Sign    Worried About Running Out of Food in the Last Year: Never true    Ran Out of Food in the Last Year: Never true  Transportation Needs: No Transportation Needs (12/09/2021)   PRAPARE - Hydrologist (Medical): No    Lack of Transportation (Non-Medical): No  Physical Activity: Insufficiently Active (12/09/2021)   Exercise Vital Sign    Days of Exercise per Week: 2 days    Minutes of Exercise per Session: 30 min  Stress: No Stress Concern Present (12/09/2021)   [place]    Feeling of Stress : Not at all  Social Connections: Moderately Integrated (12/09/2021)   Social Connection and Isolation Panel [NHANES]    Frequency of Communication with Friends and Family: Three times a week    Frequency of Social Gatherings with Friends and Family: Three times a week    Attends Religious Services: Never    Active Member of Clubs  or Organizations: Yes    Attends Archivist Meetings: More than 4 times per year    Marital Status: Married  Human resources officer Violence: Not At Risk (12/09/2021)   Humiliation, Afraid, Rape, and Kick questionnaire    Fear of Current or Ex-Partner: No    Emotionally Abused: No    Physically Abused: No    Sexually Abused: No    Review of Systems  Constitutional:  Negative for fatigue and unexpected weight change.  Eyes:  Negative for visual disturbance.  Respiratory:  Negative for cough, chest tightness and shortness of breath.   Cardiovascular:  Negative for chest pain, palpitations and leg swelling.  Gastrointestinal:  Negative for abdominal pain and blood in stool.  Neurological:  Negative for dizziness, light-headedness and headaches.     Objective:   Vitals:   09/13/22 1000  BP: 128/62  Pulse: 79  Resp: 18  Temp: 97.7 F (36.5 C)  TempSrc: Oral  SpO2: 97%  Weight: 277 lb 2 oz (125.7 kg)  Height: '6\' 1"'$  (1.854 m)     Physical Exam Vitals reviewed.  Constitutional:      Appearance: He is well-developed.  HENT:     Head: Normocephalic and atraumatic.  Neck:     Vascular: No carotid bruit or JVD.  Cardiovascular:     Rate and Rhythm: Normal rate and regular rhythm.     Heart sounds: Normal heart sounds. No murmur heard. Pulmonary:     Effort: Pulmonary effort is normal.     Breath sounds: Normal breath sounds. No rales.  Musculoskeletal:     Right lower leg: No edema.     Left lower leg: No edema.  Skin:    General: Skin is warm and dry.  Neurological:     Mental Status: He is alert and oriented to person, place, and time.  Psychiatric:        Mood and Affect: Mood normal.     Results for orders placed or performed in visit on 09/13/22  POCT Glucose (Device for Home Use)  Result Value Ref Range   Glucose Fasting, POC 177 (A) 70 - 99 mg/dL   POC Glucose 177 (A) 70 - 99 mg/dl      Assessment & Plan:  Amer Alcindor is a 87 y.o. male . Type 2  diabetes mellitus with obesity ([place]) - Plan: POCT  Glucose (Device for Home Use) -Borderline control last A1c but did have some hypoglycemia previously.  Tolerating low-dose glimepiride without hypoglycemia, home readings overall mid 100s.  Recheck in 6 weeks with A1c, labs at that time, no changes for now.  RTC precautions if return of hypoglycemia or hyperglycemia  Orthostatic hypotension -Improved now on midodrine.  Stable off evening dose.  Continue follow-up with cardiology as planned.  No med changes today.  No orders of the defined types were placed in this encounter.  Patient Instructions  Glad to hear you are doing well.  No med changes for now.  Recheck with me in 6 weeks, sooner if any new symptoms.  Take care!    Signed,   Merri Ray, MD [place] 09/13/22 11:01 AM

## 2022-09-13 NOTE — Patient Instructions (Signed)
Glad to hear you are doing well.  No med changes for now.  Recheck with me in 6 weeks, sooner if any new symptoms.  Take care!

## 2022-09-21 ENCOUNTER — Other Ambulatory Visit: Payer: Self-pay | Admitting: Family Medicine

## 2022-09-24 ENCOUNTER — Ambulatory Visit: Payer: Medicare HMO | Admitting: Nurse Practitioner

## 2022-09-27 ENCOUNTER — Institutional Professional Consult (permissible substitution): Payer: Medicare HMO | Admitting: Internal Medicine

## 2022-09-27 DIAGNOSIS — I951 Orthostatic hypotension: Secondary | ICD-10-CM

## 2022-09-27 NOTE — Progress Notes (Deleted)
ELECTROPHYSIOLOGY CONSULT NOTE  Patient ID: Deontrey Maish, MRN: [id number], DOB/AGE: [birth year] 85 y.o. Admit date: (Not on file) Date of Consult: 09/27/2022  Primary Physician: [name], MD Primary Cardiologist: ***     Lochlain Bigner is a 87 y.o. male who is being seen today for the evaluation of *** at the request of ***.    HPI Andin Hafey is a 87 y.o. male referred for orthostatic hypotension    DATE TEST EF   ***/*** ***  *** %   4/23 (CE) CTA   Nonobstructive CAD        Date Cr K Hgb  8/23   13.0  11/23 1.42 5.3              Past Medical History:  Diagnosis Date   Ascending aortic aneurysm ([place]) 01/22/2022   CAD in native artery 01/22/2022   Cancer ([place])    Cataract    Diabetes mellitus without complication ([place])    DM (diabetes mellitus) ([place])    Gait instability 01/22/2022   Hypertension    Sleep apnea       Surgical History: No past surgical history on file.   Home Meds: No outpatient medications have been marked as taking for the 09/27/22 encounter (Appointment) with Deboraha Sprang, MD.    Allergies:  Allergies  Allergen Reactions   Lisinopril Cough, Swelling and Other (See Comments)    Social History   Socioeconomic History   Marital status: Married    Spouse name: Not on file   Number of children: Not on file   Years of education: Not on file   Highest education level: Not on file  Occupational History   Not on file  Tobacco Use   Smoking status: Former    Types: Pipe   Smokeless tobacco: Never  Substance and Sexual Activity   Alcohol use: Yes    Alcohol/week: 2.0 standard drinks of alcohol    Types: 2 Glasses of wine per week   Drug use: Never   Sexual activity: Never  Other Topics Concern   Not on file  Social History Narrative   Not on file   Social Determinants of Health   Financial Resource Strain: Low Risk  (12/09/2021)   Overall Financial Resource Strain (CARDIA)    Difficulty of Paying Living Expenses: Not  hard at all  Food Insecurity: No Food Insecurity (12/09/2021)   Hunger Vital Sign    Worried About Running Out of Food in the Last Year: Never true    Ran Out of Food in the Last Year: Never true  Transportation Needs: No Transportation Needs (12/09/2021)   PRAPARE - Hydrologist (Medical): No    Lack of Transportation (Non-Medical): No  Physical Activity: Insufficiently Active (12/09/2021)   Exercise Vital Sign    Days of Exercise per Week: 2 days    Minutes of Exercise per Session: 30 min  Stress: No Stress Concern Present (12/09/2021)   [place]    Feeling of Stress : Not at all  Social Connections: Moderately Integrated (12/09/2021)   Social Connection and Isolation Panel [NHANES]    Frequency of Communication with Friends and Family: Three times a week    Frequency of Social Gatherings with Friends and Family: Three times a week    Attends Religious Services: Never    Active Member of Clubs or Organizations: Yes    Attends  Club or Organization Meetings: More than 4 times per year    Marital Status: Married  Human resources officer Violence: Not At Risk (12/09/2021)   Humiliation, Afraid, Rape, and Kick questionnaire    Fear of Current or Ex-Partner: No    Emotionally Abused: No    Physically Abused: No    Sexually Abused: No     Family History  Problem Relation Age of Onset   Heart attack Mother    Cancer Father    Cancer Sister      ROS:  Please see the history of present illness.   {ros [id number]}  All other systems reviewed and negative.    Physical Exam:*** There were no vitals taken for this visit. General: Well developed, well nourished male in no acute distress. Head: Normocephalic, atraumatic, sclera non-icteric, no xanthomas, nares are without discharge. EENT: normal  Lymph Nodes:  none Neck: Negative for carotid bruits. JVD not elevated. Back:without scoliosis  kyphosis*** Lungs: Clear bilaterally to auscultation without wheezes, rales, or rhonchi. Breathing is unlabored. Heart: RRR with S1 S2. No *** ***/6 systolic*** murmur . No rubs, or gallops appreciated. Abdomen: Soft, non-tender, non-distended with normoactive bowel sounds. No hepatomegaly. No rebound/guarding. No obvious abdominal masses. Msk:  Strength and tone appear normal for age. Extremities: No clubbing or cyanosis. No*** ***+*** edema.  Distal pedal pulses are 2+ and equal bilaterally. Skin: Warm and Dry Neuro: Alert and oriented X 3. CN III-XII intact Grossly normal sensory and motor function . Psych:  Responds to questions appropriately with a normal affect.        EKG: ***   Assessment and Plan: *** [place]

## 2022-10-10 ENCOUNTER — Other Ambulatory Visit: Payer: Self-pay | Admitting: Family Medicine

## 2022-10-18 ENCOUNTER — Other Ambulatory Visit: Payer: Self-pay | Admitting: Nurse Practitioner

## 2022-10-25 ENCOUNTER — Encounter: Payer: Self-pay | Admitting: Internal Medicine

## 2022-10-25 ENCOUNTER — Ambulatory Visit (INDEPENDENT_AMBULATORY_CARE_PROVIDER_SITE_OTHER): Payer: Medicare HMO | Admitting: Family Medicine

## 2022-10-25 ENCOUNTER — Ambulatory Visit (INDEPENDENT_AMBULATORY_CARE_PROVIDER_SITE_OTHER)
Admission: RE | Admit: 2022-10-25 | Discharge: 2022-10-25 | Disposition: A | Discharge status: Home / Self Care | Payer: Medicare HMO | Source: Ambulatory Visit | Attending: Family Medicine | Admitting: Family Medicine

## 2022-10-25 ENCOUNTER — Encounter: Payer: Self-pay | Admitting: Family Medicine

## 2022-10-25 ENCOUNTER — Ambulatory Visit: Payer: Medicare HMO | Attending: Internal Medicine | Admitting: Internal Medicine

## 2022-10-25 VITALS — BP 132/60 | HR 74 | Temp 97.5°F | Ht 73.0 in | Wt 274.0 lb

## 2022-10-25 VITALS — BP 104/66 | HR 86 | Ht 73.0 in | Wt 274.2 lb

## 2022-10-25 DIAGNOSIS — R2681 Unsteadiness on feet: Secondary | ICD-10-CM

## 2022-10-25 DIAGNOSIS — I10 Essential (primary) hypertension: Secondary | ICD-10-CM

## 2022-10-25 DIAGNOSIS — E854 Organ-limited amyloidosis: Secondary | ICD-10-CM | POA: Diagnosis not present

## 2022-10-25 DIAGNOSIS — I43 Cardiomyopathy in diseases classified elsewhere: Secondary | ICD-10-CM | POA: Diagnosis not present

## 2022-10-25 DIAGNOSIS — E669 Obesity, unspecified: Secondary | ICD-10-CM

## 2022-10-25 DIAGNOSIS — R0609 Other forms of dyspnea: Secondary | ICD-10-CM | POA: Diagnosis not present

## 2022-10-25 DIAGNOSIS — R062 Wheezing: Secondary | ICD-10-CM | POA: Diagnosis not present

## 2022-10-25 DIAGNOSIS — I951 Orthostatic hypotension: Secondary | ICD-10-CM | POA: Diagnosis not present

## 2022-10-25 DIAGNOSIS — I4510 Unspecified right bundle-branch block: Secondary | ICD-10-CM | POA: Diagnosis not present

## 2022-10-25 DIAGNOSIS — E1169 Type 2 diabetes mellitus with other specified complication: Secondary | ICD-10-CM | POA: Diagnosis not present

## 2022-10-25 DIAGNOSIS — E785 Hyperlipidemia, unspecified: Secondary | ICD-10-CM | POA: Diagnosis not present

## 2022-10-25 DIAGNOSIS — R053 Chronic cough: Secondary | ICD-10-CM | POA: Diagnosis not present

## 2022-10-25 DIAGNOSIS — R0602 Shortness of breath: Secondary | ICD-10-CM | POA: Diagnosis not present

## 2022-10-25 LAB — LIPID PANEL
Cholesterol: 156 mg/dL (ref 0–200)
HDL: 47.9 mg/dL (ref >39.00)
LDL Cholesterol: 82 mg/dL (ref 0–99)
NonHDL: 107.74
Total CHOL/HDL Ratio: 3
Triglycerides: 131 mg/dL (ref 0.0–149.0)
VLDL: 26.2 mg/dL (ref 0.0–40.0)

## 2022-10-25 LAB — COMPREHENSIVE METABOLIC PANEL
ALT: 22 U/L (ref 0–53)
AST: 26 U/L (ref 0–37)
Albumin: 4 g/dL (ref 3.5–5.2)
Alkaline Phosphatase: 70 U/L (ref 39–117)
BUN: 29 mg/dL — ABNORMAL HIGH (ref 6–23)
CO2: 26 mEq/L (ref 19–32)
Calcium: 10.2 mg/dL (ref 8.4–10.5)
Chloride: 103 mEq/L (ref 96–112)
Creatinine, Ser: 1.33 mg/dL (ref 0.40–1.50)
GFR: 48.97 mL/min — ABNORMAL LOW (ref >60.00)
Glucose, Bld: 169 mg/dL — ABNORMAL HIGH (ref 70–99)
Potassium: 4.8 mEq/L (ref 3.5–5.1)
Sodium: 139 mEq/L (ref 135–145)
Total Bilirubin: 0.4 mg/dL (ref 0.2–1.2)
Total Protein: 7.4 g/dL (ref 6.0–8.3)

## 2022-10-25 LAB — POCT GLYCOSYLATED HEMOGLOBIN (HGB A1C): Hemoglobin A1C: 6.9 % — AB (ref 4.0–5.6)

## 2022-10-25 LAB — MICROALBUMIN / CREATININE URINE RATIO
Creatinine,U: 130.5 mg/dL
Microalb Creat Ratio: 16.7 mg/g (ref 0.0–30.0)
Microalb, Ur: 21.8 mg/dL — ABNORMAL HIGH (ref 0.0–1.9)

## 2022-10-25 LAB — GLUCOSE, POCT (MANUAL RESULT ENTRY): POC Glucose: 174 mg/dl — AB (ref 70–99)

## 2022-10-25 MED ORDER — LOSARTAN POTASSIUM 25 MG PO TABS: 25.0000 mg | ORAL_TABLET | Freq: Every day | ORAL | 3 refills | Status: DC

## 2022-10-25 NOTE — Progress Notes (Signed)
Subjective:  Patient ID: Jason [name], male    DOB: Aug 17, 1935  Age: 87 y.o. MRN: [id number]  CC:  Chief Complaint  Patient presents with   Diabetes    Pt notes doing well, thinks he is doing better BG wise    Form Completion    Pt requesting form for permanent handicap plate   Immunizations    about when to get COVID    Referral    Pt asking if he can do therapy to improve balance.   Wheezing    When laying in certain positions pt will get some wheezing in his chest but notes will then cough up congestion.      HPI Jason [name] presents for   Diabetes: With prior history of hypoglycemia.  Microalbuminuria.   sulfonylurea discontinued in the past, continued metformin, Actos.  Hypoglycemia resolved off glimepiride, then had to restart at 1 mg daily with higher blood sugars with starting midodrine.  Borderline control in November.  Home readings - 130-140. No symptomatic lows.  Microalbumin: 42.4 on 09/09/2021 Optho, foot exam, pneumovax:  Ophthalmology: due - appt next month.   Lab Results  Component Value Date   HGBA1C 7.5 (A) 07/14/2022   HGBA1C 7.3 (H) 03/18/2022   HGBA1C 7.5 (A) 12/17/2021   Lab Results  Component Value Date   MICROALBUR 42.4 (H) 09/09/2021   LDLCALC 71 03/18/2022   CREATININE 1.42 07/14/2022   Orthostatic hypotension See prior visits.  Evaluated by cardiology, started on midodrine 10 mg initially 3 times daily that has been helpful.  Has been okay taking 2 times per day, has not needed to evening dosing of midodrine.  Prior coronary CT CA with scattered plaque, mild stenosis in the LAD, moderate disease in the distal RCA mild to moderate disease in left circumflex, FFR CT was normal, coronary calcium score 340, takes Lipitor 40 mg daily. No lightheadedness, dizziness, or return of low blood pressures.  No chest pain.  BP Readings from Last 3 Encounters:  10/25/22 132/60  09/13/22 128/62  09/03/22 102/65   Wheezing Noted with certain positions as  above.  No known history of asthma, but bronchospasm noted on problem list.  No known history of CHF, echocardiogram ordered last June by cardiology, has not been performed.  He does have an appointment later today with cardiology/electrophysiology, Dr. Caryl Comes. Wheezing with lying down for months. No recent changes. Uses cpap, 1 pillow. No orthopnea, just slight wheeze. No PND. No heartburn. Wheezing improves with sitting up. No prior use of inhaler. Wheezing with interstitial pneumonia last year. Some slight shortness with walking only -not at rest. Follow up CXR 01/18/22: IMPRESSION: No acute cardiopulmonary findings. Stable left basilar scarring changes.  Gait instability Referred to physical therapy last year by cardiology. Improved after PT.  Requests handicap placard. Still feels somewhat. unsteady. Uses cane. Needs handicaop placard. More stable with improved blood pressure. No focal weakness.   History Patient Active Problem List   Diagnosis Date Noted   Obstructive sleep apnea of adult 08/11/2022   Diabetic nephropathy with proteinuria ([place]) 08/11/2022   Obesity 08/11/2022   Hyperlipidemia 08/11/2022   Chronic kidney disease, stage 3a ([place]) 08/11/2022   Anxiety disorder, unspecified 08/11/2022   Diabetes mellitus ([place]) 08/11/2022   Primary malignant neoplasm of prostate ([place]) 08/02/2022   Carcinoma of prostate ([place]) 08/02/2022   Shortness of breath 02/22/2022   Pure hypercholesterolemia 02/22/2022   Pain in joint, shoulder region 02/22/2022   Other malaise and fatigue 02/22/2022  Nonexudative age-related macular degeneration, bilateral, intermediate dry stage 02/22/2022   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   Gastroesophageal reflux disease 02/22/2022   Benign paroxysmal positional vertigo 02/22/2022   CAD in native artery 01/22/2022   Thoracic aortic aneurysm, without rupture, unspecified ([place]) 01/22/2022   Gait instability 01/22/2022   Essential hypertension 01/17/2022   Type 2  diabetes mellitus without complications ([place]) [id number]   HLD (hyperlipidemia) 01/17/2022   Obstructive sleep apnea 01/17/2022   Sensorineural hearing loss, bilateral 11/09/2021   Orthostatic hypotension 11/09/2021   Imbalance 11/09/2021   Overweight 02/21/2020   First degree heart block 02/21/2020   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   Malignant tumor of prostate ([place]) 02/21/2020   Vitamin D deficiency 05/31/2016   History of malignant neoplasm of prostate 05/31/2016   History of malignant neoplasm of skin 05/31/2016   Bilateral hearing loss 05/31/2016   Anxiety state 05/31/2016   Bronchospasm 09/24/2010   Cough 09/19/2010   Acute bronchitis 09/19/2010   Allergic rhinitis due to pollen 09/19/2010   Acute sinusitis 09/19/2010   Past Medical History:  Diagnosis Date   Ascending aortic aneurysm ([place]) 01/22/2022   CAD in native artery 01/22/2022   Cancer ([place])    Cataract    Diabetes mellitus without complication ([place])    DM (diabetes mellitus) ([place])    Gait instability 01/22/2022   Hypertension    Sleep apnea    No past surgical history on file. Allergies  Allergen Reactions   Lisinopril Cough, Swelling and Other (See Comments)   Prior to Admission medications   Medication Sig Start Date End Date Taking? Authorizing Provider  allopurinol (ZYLOPRIM) 300 MG tablet TAKE 1 TABLET BY MOUTH EVERY DAY 06/24/22  Yes [name], MD  aspirin EC 81 MG tablet Take 1 tablet every day by oral route. 02/21/20  Yes [provider]  atorvastatin (LIPITOR) 40 MG tablet TAKE 1 TABLET BY MOUTH EVERY DAY 06/24/22  Yes [name], MD  b complex vitamins capsule Take 1 capsule by mouth daily.   Yes [provider]  glimepiride (AMARYL) 1 MG tablet TAKE 1 TABLET BY MOUTH DAILY WITH BREAKFAST. 10/11/22  Yes [name], MD  glucose blood test strip E11.9 Use to test blood sugar twice daily or as needed 08/12/22  Yes Pavero, Harrell Gave, RPH  losartan (COZAAR) 25 MG  tablet TAKE 1 TABLET (25 MG) BY MOUTH IN THE MORNING AND AT BEDTIME 06/24/22  Yes Skeet Latch, MD  meclizine (ANTIVERT) 12.5 MG tablet TAKE 1 TABLET (12.5 MG TOTAL) BY MOUTH IN THE MORNING, AT NOON, AND AT BEDTIME. 05/26/22  Yes [name], MD  metFORMIN (GLUCOPHAGE) 500 MG tablet TAKE 2 TABLETS (1,000 MG TOTAL) BY MOUTH 2 (TWO) TIMES DAILY WITH A MEAL. 07/07/22  Yes [name], MD  metoprolol tartrate (LOPRESSOR) 25 MG tablet Take 12.5 mg by mouth 2 (two) times daily. 1/2 tablet 2 times daily 02/22/22  Yes Skeet Latch, MD  midodrine (PROAMATINE) 10 MG tablet Take 1 tablet by mouth before getting out of bed or soon after, 1 tablet at noon, and 1 tablet around 4pm Patient taking differently: Take 10 mg by mouth in the morning and at bedtime. Take 1 tablet by mouth before getting out of bed or soon after, 1 tablet at noon, and 1 tablet around 4pm 08/11/22  Yes Monge, Helane Gunther, NP  Multiple Vitamins-Minerals (ICAPS AREDS 2 PO) TAKE 1 CAPSULE BY MOUTH TWICE A DAY FOR MACULAR DEGENERATION 08/05/22  Yes [provider]  omeprazole (PRILOSEC) 20 MG capsule Take 1 capsule (20 mg total) by mouth daily. 03/04/22  Yes [name], MD  OneTouch Delica Lancets 99991111 MISC E11.9 Use to test blood sugar twice daily or as needed 08/12/22  Yes Pavero, Harrell Gave, RPH  pioglitazone (ACTOS) 15 MG tablet TAKE 1 TABLET (15 MG TOTAL) BY MOUTH DAILY. 06/24/22  Yes [name], MD  venlafaxine (EFFEXOR) 75 MG tablet TAKE 1 TABLET BY MOUTH EVERY DAY 09/21/22  Yes [name], MD   Social History   Socioeconomic History   Marital status: Married    Spouse name: Not on file   Number of children: Not on file   Years of education: Not on file   Highest education level: Not on file  Occupational History   Not on file  Tobacco Use   Smoking status: Former    Types: Pipe   Smokeless tobacco: Never  Substance and Sexual Activity   Alcohol use: Yes    Alcohol/week: 2.0 standard  drinks of alcohol    Types: 2 Glasses of wine per week   Drug use: Never   Sexual activity: Never  Other Topics Concern   Not on file  Social History Narrative   Not on file   Social Determinants of Health   Financial Resource Strain: Low Risk  (12/09/2021)   Overall Financial Resource Strain (CARDIA)    Difficulty of Paying Living Expenses: Not hard at all  Food Insecurity: No Food Insecurity (12/09/2021)   Hunger Vital Sign    Worried About Running Out of Food in the Last Year: Never true    Ran Out of Food in the Last Year: Never true  Transportation Needs: No Transportation Needs (12/09/2021)   PRAPARE - Hydrologist (Medical): No    Lack of Transportation (Non-Medical): No  Physical Activity: Insufficiently Active (12/09/2021)   Exercise Vital Sign    Days of Exercise per Week: 2 days    Minutes of Exercise per Session: 30 min  Stress: No Stress Concern Present (12/09/2021)   [place]    Feeling of Stress : Not at all  Social Connections: Moderately Integrated (12/09/2021)   Social Connection and Isolation Panel [NHANES]    Frequency of Communication with Friends and Family: Three times a week    Frequency of Social Gatherings with Friends and Family: Three times a week    Attends Religious Services: Never    Active Member of Clubs or Organizations: Yes    Attends Archivist Meetings: More than 4 times per year    Marital Status: Married  Human resources officer Violence: Not At Risk (12/09/2021)   Humiliation, Afraid, Rape, and Kick questionnaire    Fear of Current or Ex-Partner: No    Emotionally Abused: No    Physically Abused: No    Sexually Abused: No    Review of Systems   Objective:   Vitals:   10/25/22 0900  BP: 132/60  Pulse: 74  Temp: (!) 97.5 F (36.4 C)  TempSrc: Temporal  SpO2: 95%  Weight: 274 lb (124.3 kg)  Height: '6\' 1"'$  (1.854 m)     Physical  Exam Vitals reviewed.  Constitutional:      Appearance: He is well-developed. He is obese.  HENT:     Head: Normocephalic and atraumatic.  Neck:     Vascular: No carotid bruit or JVD.  Cardiovascular:     Rate  and Rhythm: Normal rate and regular rhythm.     Heart sounds: Normal heart sounds. No murmur heard. Pulmonary:     Effort: Pulmonary effort is normal.     Breath sounds: Normal breath sounds. No rales.  Musculoskeletal:     Right lower leg: No edema.     Left lower leg: No edema.  Skin:    General: Skin is warm and dry.  Neurological:     General: No focal deficit present.     Mental Status: He is alert and oriented to person, place, and time.     Comments: Ambulating with cane, no focal deficits.  Baseline  Psychiatric:        Mood and Affect: Mood normal.     Results for orders placed or performed in visit on 09/13/22  POCT Glucose (Device for Home Use)  Result Value Ref Range   Glucose Fasting, POC 177 (A) 70 - 99 mg/dL   POC Glucose 177 (A) 70 - 99 mg/dl     Assessment & Plan:  Jason [name] is a 87 y.o. male . Type 2 diabetes mellitus with obesity ([place]) - Plan: Microalbumin / creatinine urine ratio, POCT glucose (manual entry), POCT glycosylated hemoglobin (Hb A1C)  -Stable with current med regimen without hypoglycemia, improved A1c.  Watch for hypoglycemia, and if that occurs we will need to stop glimepiride.  No changes for now.  35-monthfollow-up.  Orthostatic hypotension  -Improved with midodrine twice daily.  Has follow-up with electrophysiology today.  No med changes for now.  Hyperlipidemia, unspecified hyperlipidemia type - Plan: Lipid panel, Comprehensive metabolic panel  -Tolerating current med regimen, check labs.  Unsteadiness - Plan: Ambulatory referral to Physical Therapy  -Possible component of deconditioning, will refer to physical therapy again for assessment of postural strength, gait and treatment/strengthening.  Handicap placard also  completed.  Wheezing - Plan: DG Chest 2 View, Pro b natriuretic peptide DOE (dyspnea on exertion) - Plan: DG Chest 2 View, Pro b natriuretic peptide  -Chronic dyspnea with exertion, intermittent wheeze as above with lying down.  Denies chest pain.  Prior interstitial pneumonia with wheeze.  Improved on repeat x-ray last year.  Question component of CHF.  Denies paroxysmal nocturnal dyspnea or true orthopnea but previous echo ordered, not completed.  Check BNP, chest x-ray, then may need to proceed with echo as previously ordered by cardiology.  Can also discuss with electrophysiology today.  RTC/ER precautions given.  No orders of the defined types were placed in this encounter.  Patient Instructions  Congratulations on improved diabetes control.  If any low blood sugars let me know right away, otherwise no changes in meds for now.  343-monthecheck. [facility] if [facility] as previously you had a pneumonia with wheezing.  I will check some blood work including a heart failure test today, but discussed with cardiology whether you need to still have the echocardiogram that was ordered previously.  That can look to see if there is any component of congestive heart failure.  If any worsening symptoms, be seen right away. I will refer you to physical therapy.  Continue to use cane, handicap placard completed.  Take care!  [facility] or xray: Walk in 8:30-4:30 during weekdays, no appointment needed [address] [id number]   Signed,   JeMerri RayMD [facility] 10/25/22 9:28 AM

## 2022-10-25 NOTE — Progress Notes (Signed)
ELECTROPHYSIOLOGY CONSULT NOTE  Patient ID: Jason [name], MRN: [id number], DOB/AGE: [birth year] 85 y.o. Admit date: (Not on file) Date of Consult: 10/25/2022  Primary Physician: [name], MD Primary Cardiologist: TR     Chevas Colyer is a 87 y.o. male who is being seen today for the evaluation of orthostatic hypotension at the request of TR.    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  Notable particularly upon arising from bed and in the shower, not so much from rising from the commode.  He is heat intolerant.  Has had a history of recurrent orthostatic falls.  More remotely he had been treated with meclizine without benefit, he remains on it.  Significant systolic hypertension for which she been treated with losartan.  No known heart disease.  Denies dyspnea but he is relatively limited his ability to get around.  No edema nocturnal dyspnea.  He does have treated sleep apnea.   Date Cr K Hgb  8/23   13.0  3/24 1.33 4.8             Past Medical History:  Diagnosis Date   Ascending aortic aneurysm ([place]) 01/22/2022   CAD in native artery 01/22/2022   Cancer [facility])    Cataract    Diabetes mellitus without complication ([place])    DM (diabetes mellitus) ([place])    Gait instability 01/22/2022   Hypertension    Sleep apnea       Surgical History: History reviewed. No pertinent surgical history.   Home Meds: Current Meds  Medication Sig   allopurinol (ZYLOPRIM) 300 MG tablet TAKE 1 TABLET BY MOUTH EVERY DAY   aspirin EC 81 MG tablet Take 1 tablet every day by oral route.   atorvastatin (LIPITOR) 40 MG tablet TAKE 1 TABLET BY MOUTH EVERY DAY   b complex vitamins capsule Take 1 capsule by mouth daily.   glimepiride (AMARYL) 1 MG tablet TAKE 1 TABLET BY MOUTH DAILY WITH BREAKFAST.   glucose blood test strip E11.9 Use to test blood sugar twice daily or as needed    metFORMIN (GLUCOPHAGE) 500 MG tablet TAKE 2 TABLETS (1,000 MG TOTAL) BY MOUTH 2 (TWO) TIMES DAILY WITH A MEAL.   metoprolol tartrate (LOPRESSOR) 25 MG tablet Take 12.5 mg by mouth 2 (two) times daily. 1/2 tablet 2 times daily   midodrine (PROAMATINE) 10 MG tablet Take 1 tablet by mouth before getting out of bed or soon after, 1 tablet at noon, and 1 tablet around 4pm (Patient taking differently: Take 10 mg by mouth in the morning and at bedtime.)   Multiple Vitamins-Minerals (ICAPS AREDS 2 PO) TAKE 1 CAPSULE BY MOUTH TWICE A DAY FOR MACULAR DEGENERATION   omeprazole (PRILOSEC) 20 MG capsule Take 1 capsule (20 mg total) by mouth daily.   OneTouch Delica Lancets 99991111 MISC E11.9 Use to test blood sugar twice daily or as needed   pioglitazone (ACTOS) 15 MG tablet TAKE 1 TABLET (15 MG TOTAL) BY MOUTH DAILY.   venlafaxine (EFFEXOR) 75 MG tablet TAKE 1 TABLET BY MOUTH EVERY DAY   [DISCONTINUED] losartan (COZAAR) 25 MG tablet TAKE 1 TABLET (25 MG) BY MOUTH IN THE MORNING AND AT BEDTIME   [DISCONTINUED] meclizine (ANTIVERT) 12.5 MG tablet TAKE 1 TABLET (12.5 MG TOTAL) BY MOUTH IN THE MORNING, AT NOON, AND AT BEDTIME.    Allergies:  Allergies  Allergen Reactions  Lisinopril Cough, Swelling and Other (See Comments)    Social History   Socioeconomic History   Marital status: Married    Spouse name: Not on file   Number of children: Not on file   Years of education: Not on file   Highest education level: Not on file  Occupational History   Not on file  Tobacco Use   Smoking status: Former    Types: Pipe   Smokeless tobacco: Never  Substance and Sexual Activity   Alcohol use: Yes    Alcohol/week: 2.0 standard drinks of alcohol    Types: 2 Glasses of wine per week   Drug use: Never   Sexual activity: Never  Other Topics Concern   Not on file  Social History Narrative   Not on file   Social Determinants of Health   Financial Resource Strain: Low Risk  (12/09/2021)   Overall Financial  Resource Strain (CARDIA)    Difficulty of Paying Living Expenses: Not hard at all  Food Insecurity: No Food Insecurity (12/09/2021)   Hunger Vital Sign    Worried About Running Out of Food in the Last Year: Never true    Ran Out of Food in the Last Year: Never true  Transportation Needs: No Transportation Needs (12/09/2021)   PRAPARE - Hydrologist (Medical): No    Lack of Transportation (Non-Medical): No  Physical Activity: Insufficiently Active (12/09/2021)   Exercise Vital Sign    Days of Exercise per Week: 2 days    Minutes of Exercise per Session: 30 min  Stress: No Stress Concern Present (12/09/2021)   [place]    Feeling of Stress : Not at all  Social Connections: Moderately Integrated (12/09/2021)   Social Connection and Isolation Panel [NHANES]    Frequency of Communication with Friends and Family: Three times a week    Frequency of Social Gatherings with Friends and Family: Three times a week    Attends Religious Services: Never    Active Member of Clubs or Organizations: Yes    Attends Archivist Meetings: More than 4 times per year    Marital Status: Married  Human resources officer Violence: Not At Risk (12/09/2021)   Humiliation, Afraid, Rape, and Kick questionnaire    Fear of Current or Ex-Partner: No    Emotionally Abused: No    Physically Abused: No    Sexually Abused: No     Family History  Problem Relation Age of Onset   Heart attack Mother    Cancer Father    Cancer Sister      ROS:  Please see the history of present illness.   All he has an aneurysm to all other systems reviewed and negative.    Physical Exam  Blood pressure 104/66, pulse 86, height '6\' 1"'$  (1.854 m), weight 274 lb 3.2 oz (124.4 kg), SpO2 97 %. General: Well developed, well nourished male in no acute distress. Head: Normocephalic, atraumatic, sclera non-icteric, no xanthomas, nares are without  discharge. EENT: normal  Lymph Nodes:  none Neck: Negative for carotid bruits. JVD not elevated. Back:without scoliosis kyphosis discussed Over there, or 3 we will get Lungs: Clear bilaterally to auscultation without wheezes, rales, or rhonchi. Breathing is unlabored. Heart: RRR with S1 S2. No  /6 systolic murmur . No rubs, or gallops appreciated. Abdomen: Soft, non-tender, non-distended with normoactive bowel sounds. No hepatomegaly. No rebound/guarding. No obvious abdominal masses. Msk:  Strength and tone  appear normal for age. Extremities: No clubbing or cyanosis. + edema.  Distal pedal pulses are 2+ and equal bilaterally. Skin: Warm and Dry Neuro: Alert and oriented X 3. CN III-XII intact Grossly normal sensory and motor function . Psych:  Responds to questions appropriately with a normal affect.        EKG: Sinus at 71 Intervals 28/12/46 Axis left -52 Possible IMI   Assessment and Plan:  Orthostatic hypotension  Possible systemic autonomic failure  Supine systolic hypertension  Trifascicular block  Diabetes  Morbid obesity  Possible inferior wall MI  Aortic aneurysm   The patient has orthostatic hypotension that is quite profound and notably without a marked compensatory change in heart rate suggesting autonomic insufficiency.  This is further supported by his constipation dry eyes and dry mouth.  We discussed the physiology of orthostatic intolerance including gravitational fluid shifts and the impact of hypertensive vascular disease on orthostasis and treatment options.  We discussed pharmacological options including midodrine, pyridostigmine, .  We discussed nonpharmacological options including raising the HOB, isometric contraction upon standing, abdominal binders  thigh sleeves. We emphasized the importance of recognizing the prodrome and sitting prior to falling, safety in the shower and in the bathroom and the avoidance of dehydration     We will begin with a  nonpharmacological approach with raising the head of the bed, compression of thigh and or abdomen.  He can continue to wear Compression if he desires.  We will leave his midodrine dosing to 10 mg upon awakening from sleep and from his nap, and have encouraged him to take 12 to 16 ounces of water with that medication.  Moreover, discussed the importance of a shower chair, he has not been able to exercise because of the vertical, he does use a CUBII and suggested that the pool may also be a possibility and or an exercise bicycle with the caveat being that post exercise is associated with vasodilatation  In the event that we cannot mitigate his symptoms sufficiently he continues pyridostigmine.  He has been taking and for this dizziness for many years we will stop it.  He does not need daytime losartan, we will have him take it at bedtime to try to mitigate some systolic supine hypertension  With his trifascicular block, it raises the possibility of a other systemic disorders including amyloid.  Will get an echocardiogram to look at left ventricular wall thicknesses and a PYP scan     Virl Axe

## 2022-10-25 NOTE — Patient Instructions (Addendum)
Medication Instructions:  Your physician has recommended you make the following change in your medication:   ** Stop Losartan - morning dose  ** Stop Antivert  *If you need a refill on your cardiac medications before your next appointment, please call your pharmacy*   Lab Work: None ordered.  If you have labs (blood work) drawn today and your tests are completely normal, you will receive your results only by: [place] (if you have MyChart) OR A paper copy in the mail If you have any lab test that is abnormal or we need to change your treatment, we will call you to review the results.   Testing/Procedures: Your physician has requested that you have an echocardiogram. Echocardiography is a painless test that uses sound waves to create images of your heart. It provides your doctor with information about the size and shape of your heart and how well your heart's chambers and valves are working. This procedure takes approximately one hour. There are no restrictions for this procedure. Please do NOT wear cologne, perfume, aftershave, or lotions (deodorant is allowed). Please arrive 15 minutes prior to your appointment time.  ** Dr Caryl Comes would like for you to have a Myocardial Amyloid Scan    Follow-Up: At [facility], you and your health needs are our priority.  As part of our continuing mission to provide you with exceptional heart care, we have created designated Provider Care Teams.  These Care Teams include your primary Cardiologist (physician) and Advanced Practice Providers (APPs -  Physician Assistants and Nurse Practitioners) who all work together to provide you with the care you need, when you need it.  We recommend signing up for the patient portal called "MyChart".  Sign up information is provided on this After Visit Summary.  MyChart is used to connect with patients for Virtual Visits (Telemedicine).  Patients are able to view lab/test results, encounter notes,  upcoming appointments, etc.  Non-urgent messages can be sent to your provider as well.   To learn more about what you can do with MyChart, go to [email or web address].    Your next appointment:  3 months with Dr Caryl Comes   Other Instructions Abdominal binder and thigh sleeves as discussed by Dr Caryl Comes.  Raise head of bed 4-6 inches

## 2022-10-25 NOTE — Patient Instructions (Addendum)
Congratulations on improved diabetes control.  If any low blood sugars let me know right away, otherwise no changes in meds for now.  31-monthrecheck. [facility] as previously you had a pneumonia with wheezing.  I will check some blood work including a heart failure test today, but discussed with cardiology whether you need to still have the echocardiogram that was ordered previously.  That can look to see if there is any component of congestive heart failure.  If any worsening symptoms, be seen right away. I will refer you to physical therapy.  Continue to use cane, handicap placard completed.  Take care!  [facility] or xray: Walk in 8:30-4:30 during weekdays, no appointment needed [address]  [place] [id number]

## 2022-10-26 ENCOUNTER — Encounter: Payer: Self-pay | Admitting: Internal Medicine

## 2022-10-27 LAB — PRO B NATRIURETIC PEPTIDE: NT-Pro BNP: 501 pg/mL — ABNORMAL HIGH (ref 0–486)

## 2022-10-29 ENCOUNTER — Other Ambulatory Visit: Payer: Self-pay | Admitting: Family Medicine

## 2022-10-29 DIAGNOSIS — E1169 Type 2 diabetes mellitus with other specified complication: Secondary | ICD-10-CM

## 2022-10-30 ENCOUNTER — Other Ambulatory Visit: Payer: Self-pay | Admitting: Family Medicine

## 2022-10-30 DIAGNOSIS — K219 Gastro-esophageal reflux disease without esophagitis: Secondary | ICD-10-CM

## 2022-11-04 ENCOUNTER — Ambulatory Visit: Payer: Medicare HMO | Admitting: Physical Therapy

## 2022-11-05 DIAGNOSIS — D3132 Benign neoplasm of left choroid: Secondary | ICD-10-CM | POA: Diagnosis not present

## 2022-11-05 DIAGNOSIS — H353131 Nonexudative age-related macular degeneration, bilateral, early dry stage: Secondary | ICD-10-CM | POA: Diagnosis not present

## 2022-11-05 DIAGNOSIS — H04123 Dry eye syndrome of bilateral lacrimal glands: Secondary | ICD-10-CM | POA: Diagnosis not present

## 2022-11-07 ENCOUNTER — Other Ambulatory Visit: Payer: Self-pay | Admitting: Nurse Practitioner

## 2022-11-07 DIAGNOSIS — I951 Orthostatic hypotension: Secondary | ICD-10-CM

## 2022-11-19 ENCOUNTER — Telehealth (HOSPITAL_COMMUNITY): Payer: Self-pay | Admitting: *Deleted

## 2022-11-19 NOTE — Telephone Encounter (Signed)
Reminder call for upcoming test for Amyloid Study on 11/24/22 at 12:30

## 2022-11-24 ENCOUNTER — Ambulatory Visit (HOSPITAL_BASED_OUTPATIENT_CLINIC_OR_DEPARTMENT_OTHER): Payer: Medicare HMO

## 2022-11-24 ENCOUNTER — Ambulatory Visit (HOSPITAL_COMMUNITY): Payer: Medicare HMO | Attending: Internal Medicine

## 2022-11-24 DIAGNOSIS — E854 Organ-limited amyloidosis: Secondary | ICD-10-CM | POA: Insufficient documentation

## 2022-11-24 DIAGNOSIS — I4510 Unspecified right bundle-branch block: Secondary | ICD-10-CM

## 2022-11-24 DIAGNOSIS — I43 Cardiomyopathy in diseases classified elsewhere: Secondary | ICD-10-CM | POA: Diagnosis not present

## 2022-11-24 LAB — ECHOCARDIOGRAM COMPLETE
Area-P 1/2: 3.06 cm2
P 1/2 time: 376 msec
S' Lateral: 3.5 cm

## 2022-11-24 MED ORDER — TECHNETIUM TC 99M PYROPHOSPHATE
21.8000 | Freq: Once | INTRAVENOUS | Status: AC
Start: 2022-11-24 — End: 2022-11-24
  Administered 2022-11-24: 21.8 via INTRAVENOUS

## 2022-12-02 ENCOUNTER — Ambulatory Visit (HOSPITAL_BASED_OUTPATIENT_CLINIC_OR_DEPARTMENT_OTHER): Payer: Medicare HMO | Attending: Family Medicine | Admitting: Physical Therapy

## 2022-12-02 ENCOUNTER — Other Ambulatory Visit: Payer: Self-pay

## 2022-12-02 ENCOUNTER — Telehealth: Payer: Self-pay | Admitting: Family Medicine

## 2022-12-02 DIAGNOSIS — R42 Dizziness and giddiness: Secondary | ICD-10-CM | POA: Insufficient documentation

## 2022-12-02 DIAGNOSIS — R2681 Unsteadiness on feet: Secondary | ICD-10-CM

## 2022-12-02 DIAGNOSIS — R262 Difficulty in walking, not elsewhere classified: Secondary | ICD-10-CM | POA: Diagnosis not present

## 2022-12-02 NOTE — Therapy (Signed)
OUTPATIENT PHYSICAL THERAPY LOWER EXTREMITY EVALUATION   Patient Name: Jason [name] MRN: [id number] DOB:1936/03/15, 87 y.o., male Today's Date: 12/02/2022  END OF SESSION:  PT End of Session - 12/02/22 0913     Visit Number 1    Number of Visits 16    Date for PT Re-Evaluation 01/27/23    PT Start Time 0845    PT Stop Time 0916   limited by blodd pressure   PT Time Calculation (min) 31 min    Activity Tolerance Patient tolerated treatment well    Behavior During Therapy [facility] for tasks assessed/performed             Past Medical History:  Diagnosis Date   Ascending aortic aneurysm (HCC) 01/22/2022   CAD in native artery 01/22/2022   Cancer (HCC)    Cataract    Diabetes mellitus without complication (HCC)    DM (diabetes mellitus) (HCC)    Gait instability 01/22/2022   Hypertension    Sleep apnea    No past surgical history on file. Patient Active Problem List   Diagnosis Date Noted   Obstructive sleep apnea of adult 08/11/2022   Diabetic nephropathy with proteinuria 08/11/2022   Obesity 08/11/2022   Hyperlipidemia 08/11/2022   Chronic kidney disease, stage 3a 08/11/2022   Anxiety disorder, unspecified 08/11/2022   Diabetes mellitus 08/11/2022   Primary malignant neoplasm of prostate 08/02/2022   Carcinoma of prostate 08/02/2022   Shortness of breath 02/22/2022   Pure hypercholesterolemia 02/22/2022   Pain in joint, shoulder region 02/22/2022   Other malaise and fatigue 02/22/2022   Nonexudative age-related macular degeneration, bilateral, intermediate dry stage 02/22/2022   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   Gastroesophageal reflux disease 02/22/2022   Benign paroxysmal positional vertigo 02/22/2022   CAD in native artery 01/22/2022   Thoracic aortic aneurysm, without rupture, unspecified 01/22/2022   Gait instability 01/22/2022   Essential hypertension 01/17/2022   Type 2 diabetes mellitus without complications 01/17/2022   HLD (hyperlipidemia) 01/17/2022    Obstructive sleep apnea 01/17/2022   Sensorineural hearing loss, bilateral 11/09/2021   Orthostatic hypotension 11/09/2021   Imbalance 11/09/2021   Overweight 02/21/2020   First degree heart block 02/21/2020   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   Malignant tumor of prostate 02/21/2020   Vitamin D deficiency 05/31/2016   History of malignant neoplasm of prostate 05/31/2016   History of malignant neoplasm of skin 05/31/2016   Bilateral hearing loss 05/31/2016   Anxiety state 05/31/2016   Bronchospasm 09/24/2010   Cough 09/19/2010   Acute bronchitis 09/19/2010   Allergic rhinitis due to pollen 09/19/2010   Acute sinusitis 09/19/2010    PCP: Meredith Staggers, MD  REFERRING PROVIDER: Meredith Staggers, MD  REFERRING DIAG: R26.81 (ICD-10-CM) - Unsteadiness   THERAPY DIAG:  Unsteadiness on feet  Difficulty in walking, not elsewhere classified  Dizziness and giddiness  Rationale for Evaluation and Treatment: Rehabilitation  ONSET DATE: march 2024  SUBJECTIVE:   SUBJECTIVE STATEMENT: Patient presents to therapy with a longstanding history of orthostatic hypertension and declining overall mobility and balance. He has had 2 falls in the past 6 months. He was being seen by therapy previously but had to stop 2nd to worsening symptoms. He has had medication adjustments and now feels like his syncope is more controlled. He feels like today he is tried and more syncopal. He did not sleep well last night. He has had an insidious onset of left hip pain. He has not been formally looked at for his left hip pain.  He has not been able to work on previous exercises given to him 2nd to medical complications.   PERTINENT HISTORY: Type 2 diabetes, sleep apnea, thoracic aortic aneurysm, orthorstatic hypotension  PAIN:  Are you having pain? No  PRECAUTIONS: None  WEIGHT BEARING RESTRICTIONS: No  FALLS:  Has patient fallen in last 6 months? Yes. Number of falls 2, step done on a small step. Another one  reaching down and fell  LIVING ENVIRONMENT: Lives with: lives with their family Lives in: House/apartment Stairs: No Has following equipment at home: Single point cane  OCCUPATION: retired   PLOF: Independent  PATIENT GOALS: walking without pain, being more stable  NEXT MD VISIT: nothing scheduled   OBJECTIVE:   DIAGNOSTIC FINDINGS: nothing pertinent   PATIENT SURVEYS:  FOTO    COGNITION: Overall cognitive status: Within functional limits for tasks assessed     SENSATION: WFL  EDEMA:   MUSCLE LENGTH:   POSTURE: rounded shoulders  PALPATION:   LOWER EXTREMITY ROM:  Active ROM Right eval Left eval  Hip flexion    Hip extension    Hip abduction    Hip adduction    Hip internal rotation    Hip external rotation    Knee flexion    Knee extension    Ankle dorsiflexion    Ankle plantarflexion    Ankle inversion    Ankle eversion     (Blank rows = not tested)  LOWER EXTREMITY MMT:  MMT Right eval Left eval  Hip flexion 19.5 19.3 painful  Hip extension    Hip abduction 22.5 27.1  Hip adduction    Hip internal rotation    Hip external rotation    Knee flexion    Knee extension 27.4 33.2  Ankle dorsiflexion    Ankle plantarflexion    Ankle inversion    Ankle eversion     (Blank rows = not tested)  LOWER EXTREMITY SPECIAL TESTS:   Vitals:  Patient Initial blood pressure seated was 165/118.  Patient blood pressure was retaken after a few minutes and was 171/103. Patient blood pressure was also taken standing, 134/78 FUNCTIONAL TESTS:  Will review next visit.   GAIT: Patients uses a SPC to ambulate.  Balance:  Will assess next visit when orthostatics are more controlled   TODAY'S TREATMENT:                                                                                                                              DATE:  Seated hip abduction with red band  Seated knee extension with red band  Seated hip flexion with red band   Reviewed  exercises but not perfrom repetition 2nd baseline HTN   PATIENT EDUCATION:  Education details: POC, Symptom management, HEP Person educated: Patient Education method: Explanation, Demonstration, Tactile cues, Verbal cues, and Handouts Education comprehension: verbalized understanding, returned demonstration, verbal cues required, tactile cues required, and needs further education  HOME EXERCISE PROGRAM: Access  Code: [email or web address] URL: [email or web address] Date: 12/02/2022 Prepared by: Lorayne Bender  Exercises - Seated Hip Abduction with Resistance  - 1 x daily - 7 x weekly - 3 sets - 10 reps - Seated Knee Extension with Resistance  - 1 x daily - 7 x weekly - 3 sets - 10 reps - Seated Knee Lifts with Resistance  - 1 x daily - 7 x weekly - 3 sets - 10 reps  ASSESSMENT:  CLINICAL IMPRESSION: Patient is a 87 y.o. male who was seen today for physical therapy evaluation and treatment for balance deficits, gait deficits and LE weakness. Patient had a limited eval today 2nd to baseline hypertension. Upon standing his B/P decreased to a normal level. The patient reported feeling fatigued and tired. Prior to the end of session his B/P was 145/96. He will continue to monitor at home. He would benefit from skilled therapy to improve endurance and decrease balance. He is a high fall risk at this time.   OBJECTIVE IMPAIRMENTS: decreased activity tolerance, decreased balance, decreased endurance, difficulty walking, decreased strength, and dizziness.   ACTIVITY LIMITATIONS: carrying, lifting, bending, standing, squatting, sleeping, and stairs  PARTICIPATION LIMITATIONS: meal prep, cleaning, laundry, driving, shopping, community activity, and yard work  PERSONAL FACTORS: 3+ comorbidities: type 2 diabetes, orthostatic hypotension, sleep apnea, thoracic aortic anuerysm  are also affecting patient's functional outcome.   REHAB POTENTIAL: Good  CLINICAL DECISION MAKING: Evolving/moderate  complexity  EVALUATION COMPLEXITY: Moderate   GOALS: Goals reviewed with patient? Yes  SHORT TERM GOALS: Target date: 5/9 Patient will increase bilateral strength by 5lbs. Baseline: Goal status: INITIAL  2.  Patient will be independent with HEP. Baseline:  Goal status: INITIAL  3.  Patient will show minimal fluctuation and syncope with sit to stand transfer  Baseline:  Goal status: INITIAL   LONG TERM GOALS: Target date: 01/27/2023    Patient will transfer sit to stand without loss of balance or syncope  Baseline:  Goal status: INITIAL  2.  Patient will perform daily activity with no loss of balance  Baseline:  Goal status: INITIAL  3.  Patient will be able to walk community distances without any discomfort in left hip or feeling of lost balance .  Baseline:  Goal status: INITIAL      PLAN:  PT FREQUENCY: 1-2x/week  PT DURATION: 10 weeks  PLANNED INTERVENTIONS: Therapeutic exercises, Therapeutic activity, Neuromuscular re-education, Balance training, Gait training, Patient/Family education, Self Care, Joint mobilization, Stair training, Aquatic Therapy, Electrical stimulation, Cryotherapy, Moist heat, Taping, Ultrasound, Ionotophoresis 4mg /ml Dexamethasone, and Manual therapy  PLAN FOR NEXT SESSION: Consider TUG, 5 STS test, 6 minute walk, screen balance; adjust goals review HEP    Dessie Coma, PT 12/02/2022, 12:32 PM

## 2022-12-02 NOTE — Telephone Encounter (Signed)
Contacted Jason [name] to schedule their annual wellness visit. Appointment made for 12/30/2022.  Thank you,  [facility] Support [facility] Direct dial  [phone]

## 2022-12-07 ENCOUNTER — Encounter (HOSPITAL_BASED_OUTPATIENT_CLINIC_OR_DEPARTMENT_OTHER): Payer: Self-pay

## 2022-12-10 ENCOUNTER — Encounter (HOSPITAL_BASED_OUTPATIENT_CLINIC_OR_DEPARTMENT_OTHER): Payer: Self-pay

## 2022-12-10 ENCOUNTER — Ambulatory Visit (HOSPITAL_BASED_OUTPATIENT_CLINIC_OR_DEPARTMENT_OTHER): Payer: Medicare HMO

## 2022-12-10 DIAGNOSIS — R262 Difficulty in walking, not elsewhere classified: Secondary | ICD-10-CM

## 2022-12-10 DIAGNOSIS — R2681 Unsteadiness on feet: Secondary | ICD-10-CM

## 2022-12-10 DIAGNOSIS — R42 Dizziness and giddiness: Secondary | ICD-10-CM

## 2022-12-10 NOTE — Therapy (Signed)
OUTPATIENT PHYSICAL THERAPY LOWER EXTREMITY EVALUATION   Patient Name: Jason [name] MRN: [id number] DOB:1936/05/30, 87 y.o., male Today's Date: 12/10/2022  END OF SESSION:  PT End of Session - 12/10/22 1255     Visit Number 2    Number of Visits 16    Date for PT Re-Evaluation 01/27/23    PT Start Time 1147    PT Stop Time 1228    PT Time Calculation (min) 41 min    Equipment Utilized During Treatment Gait belt    Activity Tolerance Patient tolerated treatment well    Behavior During Therapy [facility] for tasks assessed/performed              Past Medical History:  Diagnosis Date   Ascending aortic aneurysm (HCC) 01/22/2022   CAD in native artery 01/22/2022   Cancer (HCC)    Cataract    Diabetes mellitus without complication (HCC)    DM (diabetes mellitus) (HCC)    Gait instability 01/22/2022   Hypertension    Sleep apnea    History reviewed. No pertinent surgical history. Patient Active Problem List   Diagnosis Date Noted   Obstructive sleep apnea of adult 08/11/2022   Diabetic nephropathy with proteinuria (HCC) 08/11/2022   Obesity 08/11/2022   Hyperlipidemia 08/11/2022   Chronic kidney disease, stage 3a (HCC) 08/11/2022   Anxiety disorder, unspecified 08/11/2022   Diabetes mellitus (HCC) 08/11/2022   Primary malignant neoplasm of prostate (HCC) 08/02/2022   Carcinoma of prostate (HCC) 08/02/2022   Shortness of breath 02/22/2022   Pure hypercholesterolemia 02/22/2022   Pain in joint, shoulder region 02/22/2022   Other malaise and fatigue 02/22/2022   Nonexudative age-related macular degeneration, bilateral, intermediate dry stage 02/22/2022   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   Gastroesophageal reflux disease 02/22/2022   Benign paroxysmal positional vertigo 02/22/2022   CAD in native artery 01/22/2022   Thoracic aortic aneurysm, without rupture, unspecified (HCC) 01/22/2022   Gait instability 01/22/2022   Essential hypertension 01/17/2022   Type 2 diabetes mellitus  without complications (HCC) 01/17/2022   HLD (hyperlipidemia) 01/17/2022   Obstructive sleep apnea 01/17/2022   Sensorineural hearing loss, bilateral 11/09/2021   Orthostatic hypotension 11/09/2021   Imbalance 11/09/2021   Overweight 02/21/2020   First degree heart block 02/21/2020   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   Malignant tumor of prostate (HCC) 02/21/2020   Vitamin D deficiency 05/31/2016   History of malignant neoplasm of prostate 05/31/2016   History of malignant neoplasm of skin 05/31/2016   Bilateral hearing loss 05/31/2016   Anxiety state 05/31/2016   Bronchospasm 09/24/2010   Cough 09/19/2010   Acute bronchitis 09/19/2010   Allergic rhinitis due to pollen 09/19/2010   Acute sinusitis 09/19/2010    PCP: Meredith Staggers, MD  REFERRING PROVIDER: Meredith Staggers, MD  REFERRING DIAG: R26.81 (ICD-10-CM) - Unsteadiness   THERAPY DIAG:  Unsteadiness on feet  Difficulty in walking, not elsewhere classified  Dizziness and giddiness  Rationale for Evaluation and Treatment: Rehabilitation  ONSET DATE: march 2024  SUBJECTIVE:   SUBJECTIVE STATEMENT: Pt reports continued L hip pain at entry. Feels tired today. Went on plane trip to [place] this Wednesday for a veteran's event and used a wheel chair.   PERTINENT HISTORY: Type 2 diabetes, sleep apnea, thoracic aortic aneurysm, orthorstatic hypotension  PAIN:  Are you having pain? No  PRECAUTIONS: None  WEIGHT BEARING RESTRICTIONS: No  FALLS:  Has patient fallen in last 6 months? Yes. Number of falls 2, step done on a small step. Another one reaching  down and fell  LIVING ENVIRONMENT: Lives with: lives with their family Lives in: House/apartment Stairs: No Has following equipment at home: Single point cane  OCCUPATION: retired   PLOF: Independent  PATIENT GOALS: walking without pain, being more stable  NEXT MD VISIT: nothing scheduled   OBJECTIVE:      DIAGNOSTIC FINDINGS: nothing pertinent   PATIENT  SURVEYS:  FOTO    COGNITION: Overall cognitive status: Within functional limits for tasks assessed     SENSATION: WFL  EDEMA:   MUSCLE LENGTH:   POSTURE: rounded shoulders  PALPATION:   LOWER EXTREMITY ROM:  Active ROM Right eval Left eval  Hip flexion    Hip extension    Hip abduction    Hip adduction    Hip internal rotation    Hip external rotation    Knee flexion    Knee extension    Ankle dorsiflexion    Ankle plantarflexion    Ankle inversion    Ankle eversion     (Blank rows = not tested)  LOWER EXTREMITY MMT:  MMT Right eval Left eval  Hip flexion 19.5 19.3 painful  Hip extension    Hip abduction 22.5 27.1  Hip adduction    Hip internal rotation    Hip external rotation    Knee flexion    Knee extension 27.4 33.2  Ankle dorsiflexion    Ankle plantarflexion    Ankle inversion    Ankle eversion     (Blank rows = not tested)  LOWER EXTREMITY SPECIAL TESTS:   4/26: Vitals: 166/108 at beginning Waited and took again: 168/96 Standing: 133/852 After seated exercise: 181/112 After rest:165/108 At end of session: 167/104  EVAL: Vitals:  Patient Initial blood pressure seated was 165/118.  Patient blood pressure was retaken after a few minutes and was 171/103. Patient blood pressure was also taken standing, 134/78 FUNCTIONAL TESTS:  TUG test: 16sec- No AD 5xSTS: 27sec (used arms to push up)  GAIT: Patients uses a SPC to ambulate.  Balance:  Will assess next visit when orthostatics are more controlled   TODAY'S TREATMENT:                                                                                                                              DATE:   4/26:   Seated hip abduction with red band x20 Seated knee extension 2x10ea Seated hip flexion with red band x20  TUG 5xSTS  PATIENT EDUCATION:  Education details: POC, Symptom management, HEP Person educated: Patient Education method: Explanation, Demonstration, Tactile cues,  Verbal cues, and Handouts Education comprehension: verbalized understanding, returned demonstration, verbal cues required, tactile cues required, and needs further education  HOME EXERCISE PROGRAM: Access Code: [email or web address] URL: [email or web address] Date: 12/02/2022 Prepared by: Lorayne Bender  Exercises - Seated Hip Abduction with Resistance  - 1 x daily - 7 x weekly - 3 sets - 10 reps - Seated Knee Extension with Resistance  - 1 x  daily - 7 x weekly - 3 sets - 10 reps - Seated Knee Lifts with Resistance  - 1 x daily - 7 x weekly - 3 sets - 10 reps  ASSESSMENT:  CLINICAL IMPRESSION: Pt again present with elevated BP, limiting exercise ability. After seated exercises his BP increased to181/156mmHg. Waited until this decreased, then completed objective testing for TUG test and 5xSTS which he completed without issue. Pt questioned if he can use a seated LE pedaler at home for 10 minute intervals. He was assured this was fine as long he monitors his BP. Will continue to monitor BP in clinic and progress as able.     OBJECTIVE IMPAIRMENTS: decreased activity tolerance, decreased balance, decreased endurance, difficulty walking, decreased strength, and dizziness.   ACTIVITY LIMITATIONS: carrying, lifting, bending, standing, squatting, sleeping, and stairs  PARTICIPATION LIMITATIONS: meal prep, cleaning, laundry, driving, shopping, community activity, and yard work  PERSONAL FACTORS: 3+ comorbidities: type 2 diabetes, orthostatic hypotension, sleep apnea, thoracic aortic anuerysm  are also affecting patient's functional outcome.   REHAB POTENTIAL: Good  CLINICAL DECISION MAKING: Evolving/moderate complexity  EVALUATION COMPLEXITY: Moderate   GOALS: Goals reviewed with patient? Yes  SHORT TERM GOALS: Target date: 5/9 Patient will increase bilateral strength by 5lbs. Baseline: Goal status: INITIAL  2.  Patient will be independent with HEP. Baseline:  Goal status:  INITIAL  3.  Patient will show minimal fluctuation and syncope with sit to stand transfer  Baseline:  Goal status: INITIAL   LONG TERM GOALS: Target date: 01/27/2023    Patient will transfer sit to stand without loss of balance or syncope  Baseline:  Goal status: INITIAL  2.  Patient will perform daily activity with no loss of balance  Baseline:  Goal status: INITIAL  3.  Patient will be able to walk community distances without any discomfort in left hip or feeling of lost balance .  Baseline:  Goal status: INITIAL      PLAN:  PT FREQUENCY: 1-2x/week  PT DURATION: 10 weeks  PLANNED INTERVENTIONS: Therapeutic exercises, Therapeutic activity, Neuromuscular re-education, Balance training, Gait training, Patient/Family education, Self Care, Joint mobilization, Stair training, Aquatic Therapy, Electrical stimulation, Cryotherapy, Moist heat, Taping, Ultrasound, Ionotophoresis 4mg /ml Dexamethasone, and Manual therapy  PLAN FOR NEXT SESSION: Consider TUG, 5 STS test, 6 minute walk, screen balance; adjust goals review HEP    Donnel Saxon Shantoya Geurts, PTA 12/10/2022, 12:59 PM

## 2022-12-13 ENCOUNTER — Ambulatory Visit (HOSPITAL_BASED_OUTPATIENT_CLINIC_OR_DEPARTMENT_OTHER): Payer: Medicare HMO

## 2022-12-13 ENCOUNTER — Encounter (HOSPITAL_BASED_OUTPATIENT_CLINIC_OR_DEPARTMENT_OTHER): Payer: Self-pay

## 2022-12-15 ENCOUNTER — Ambulatory Visit (HOSPITAL_BASED_OUTPATIENT_CLINIC_OR_DEPARTMENT_OTHER): Payer: Medicare HMO | Attending: Family Medicine

## 2022-12-15 ENCOUNTER — Encounter (HOSPITAL_BASED_OUTPATIENT_CLINIC_OR_DEPARTMENT_OTHER): Payer: Self-pay

## 2022-12-15 DIAGNOSIS — R42 Dizziness and giddiness: Secondary | ICD-10-CM | POA: Insufficient documentation

## 2022-12-15 DIAGNOSIS — R262 Difficulty in walking, not elsewhere classified: Secondary | ICD-10-CM | POA: Diagnosis not present

## 2022-12-15 DIAGNOSIS — R2681 Unsteadiness on feet: Secondary | ICD-10-CM | POA: Diagnosis not present

## 2022-12-15 NOTE — Therapy (Signed)
OUTPATIENT PHYSICAL THERAPY LOWER EXTREMITY EVALUATION   Patient Name: Tennessee Perra MRN: [id number] DOB:Apr 21, 1936, 87 y.o., male Today's Date: 12/15/2022  END OF SESSION:  PT End of Session - 12/15/22 0921     Visit Number 3    Number of Visits 16    Date for PT Re-Evaluation 01/27/23    PT Start Time 0845    PT Stop Time 0915    PT Time Calculation (min) 30 min    Activity Tolerance Patient limited by fatigue    Behavior During Therapy [facility] for tasks assessed/performed               Past Medical History:  Diagnosis Date   Ascending aortic aneurysm (HCC) 01/22/2022   CAD in native artery 01/22/2022   Cancer (HCC)    Cataract    Diabetes mellitus without complication (HCC)    DM (diabetes mellitus) (HCC)    Gait instability 01/22/2022   Hypertension    Sleep apnea    History reviewed. No pertinent surgical history. Patient Active Problem List   Diagnosis Date Noted   Obstructive sleep apnea of adult 08/11/2022   Diabetic nephropathy with proteinuria (HCC) 08/11/2022   Obesity 08/11/2022   Hyperlipidemia 08/11/2022   Chronic kidney disease, stage 3a (HCC) 08/11/2022   Anxiety disorder, unspecified 08/11/2022   Diabetes mellitus (HCC) 08/11/2022   Primary malignant neoplasm of prostate (HCC) 08/02/2022   Carcinoma of prostate (HCC) 08/02/2022   Shortness of breath 02/22/2022   Pure hypercholesterolemia 02/22/2022   Pain in joint, shoulder region 02/22/2022   Other malaise and fatigue 02/22/2022   Nonexudative age-related macular degeneration, bilateral, intermediate dry stage 02/22/2022   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   Gastroesophageal reflux disease 02/22/2022   Benign paroxysmal positional vertigo 02/22/2022   CAD in native artery 01/22/2022   Thoracic aortic aneurysm, without rupture, unspecified (HCC) 01/22/2022   Gait instability 01/22/2022   Essential hypertension 01/17/2022   Type 2 diabetes mellitus without complications (HCC) 01/17/2022   HLD  (hyperlipidemia) 01/17/2022   Obstructive sleep apnea 01/17/2022   Sensorineural hearing loss, bilateral 11/09/2021   Orthostatic hypotension 11/09/2021   Imbalance 11/09/2021   Overweight 02/21/2020   First degree heart block 02/21/2020   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   Malignant tumor of prostate (HCC) 02/21/2020   Vitamin D deficiency 05/31/2016   History of malignant neoplasm of prostate 05/31/2016   History of malignant neoplasm of skin 05/31/2016   Bilateral hearing loss 05/31/2016   Anxiety state 05/31/2016   Bronchospasm 09/24/2010   Cough 09/19/2010   Acute bronchitis 09/19/2010   Allergic rhinitis due to pollen 09/19/2010   Acute sinusitis 09/19/2010    PCP: Meredith Staggers, MD  REFERRING PROVIDER: Meredith Staggers, MD  REFERRING DIAG: R26.81 (ICD-10-CM) - Unsteadiness   THERAPY DIAG:  Unsteadiness on feet  Dizziness and giddiness  Difficulty in walking, not elsewhere classified  Rationale for Evaluation and Treatment: Rehabilitation  ONSET DATE: march 2024  SUBJECTIVE:   SUBJECTIVE STATEMENT: Pt reports he cancelled last appt due to falling. He was taking medicine when he choked a little, lost his balance, and fell. Pt reports he feels tired and a little shaky today. Has L hip pain since falling and some soreness in L knee. Also has some bruising on L forearm. No significant injury reported.   PERTINENT HISTORY: Type 2 diabetes, sleep apnea, thoracic aortic aneurysm, orthorstatic hypotension  PAIN:  Are you having pain? No  PRECAUTIONS: None  WEIGHT BEARING RESTRICTIONS: No  FALLS:  Has  patient fallen in last 6 months? Yes. Number of falls 2, step done on a small step. Another one reaching down and fell  LIVING ENVIRONMENT: Lives with: lives with their family Lives in: House/apartment Stairs: No Has following equipment at home: Single point cane  OCCUPATION: retired   PLOF: Independent  PATIENT GOALS: walking without pain, being more  stable  NEXT MD VISIT: nothing scheduled   OBJECTIVE:      DIAGNOSTIC FINDINGS: nothing pertinent   PATIENT SURVEYS:  FOTO    COGNITION: Overall cognitive status: Within functional limits for tasks assessed     SENSATION: WFL  EDEMA:   MUSCLE LENGTH:   POSTURE: rounded shoulders  PALPATION:   LOWER EXTREMITY ROM:  Active ROM Right eval Left eval  Hip flexion    Hip extension    Hip abduction    Hip adduction    Hip internal rotation    Hip external rotation    Knee flexion    Knee extension    Ankle dorsiflexion    Ankle plantarflexion    Ankle inversion    Ankle eversion     (Blank rows = not tested)  LOWER EXTREMITY MMT:  MMT Right eval Left eval  Hip flexion 19.5 19.3 painful  Hip extension    Hip abduction 22.5 27.1  Hip adduction    Hip internal rotation    Hip external rotation    Knee flexion    Knee extension 27.4 33.2  Ankle dorsiflexion    Ankle plantarflexion    Ankle inversion    Ankle eversion     (Blank rows = not tested)  LOWER EXTREMITY SPECIAL TESTS:    5/1: Vitals 139/37mmHg 70bpm  4/26: Vitals: 166/108 at beginning Waited and took again: 168/96 Standing: 133/852 After seated exercise: 181/112 After rest:165/108 At end of session: 167/104  EVAL: Vitals:  Patient Initial blood pressure seated was 165/118.  Patient blood pressure was retaken after a few minutes and was 171/103. Patient blood pressure was also taken standing, 134/78 FUNCTIONAL TESTS:  TUG test: 16sec- No AD 5xSTS: 27sec (used arms to push up)  GAIT: Patients uses a SPC to ambulate.  Balance:  Will assess next visit when orthostatics are more controlled   TODAY'S TREATMENT:                                                                                                                              DATE:   5/1 Seated hip adduction with ball sqz 5" hold 2x10 Seated clam with RTB 2x10 Seated march with RTB 2x10  4/26:   Seated hip  abduction with red band x20 Seated knee extension 2x10ea Seated hip flexion with red band x20  TUG 5xSTS  PATIENT EDUCATION:  Education details: POC, Symptom management, HEP Person educated: Patient Education method: Explanation, Demonstration, Tactile cues, Verbal cues, and Handouts Education comprehension: verbalized understanding, returned demonstration, verbal cues required, tactile cues required, and needs further education  HOME EXERCISE PROGRAM: Access Code: [email or web address] URL: [email or web address] Date: 12/02/2022 Prepared by: Lorayne Bender  Exercises - Seated Hip Abduction with Resistance  - 1 x daily - 7 x weekly - 3 sets - 10 reps - Seated Knee Extension with Resistance  - 1 x daily - 7 x weekly - 3 sets - 10 reps - Seated Knee Lifts with Resistance  - 1 x daily - 7 x weekly - 3 sets - 10 reps  ASSESSMENT:  CLINICAL IMPRESSION: BP was measured at beginning of session at 139/54mmHg, which is lower than typical. Monitored his symptoms of shakiness and fatigue throughout session and provided ample rest breaks. Focused on gentle seated exercises today. Required cues to breathe normally with exercises and avoid holding breath. Denied hip pain with seated exercises. Advised pt to see ortho MD to assess L hip since it seems to be bothering him quite a bit with walking. 135/88 after exercises (seated) and 125/76(standing).   OBJECTIVE IMPAIRMENTS: decreased activity tolerance, decreased balance, decreased endurance, difficulty walking, decreased strength, and dizziness.   ACTIVITY LIMITATIONS: carrying, lifting, bending, standing, squatting, sleeping, and stairs  PARTICIPATION LIMITATIONS: meal prep, cleaning, laundry, driving, shopping, community activity, and yard work  PERSONAL FACTORS: 3+ comorbidities: type 2 diabetes, orthostatic hypotension, sleep apnea, thoracic aortic anuerysm  are also affecting patient's functional outcome.   REHAB POTENTIAL:  Good  CLINICAL DECISION MAKING: Evolving/moderate complexity  EVALUATION COMPLEXITY: Moderate   GOALS: Goals reviewed with patient? Yes  SHORT TERM GOALS: Target date: 5/9 Patient will increase bilateral strength by 5lbs. Baseline: Goal status: INITIAL  2.  Patient will be independent with HEP. Baseline:  Goal status: INITIAL  3.  Patient will show minimal fluctuation and syncope with sit to stand transfer  Baseline:  Goal status: INITIAL   LONG TERM GOALS: Target date: 01/27/2023    Patient will transfer sit to stand without loss of balance or syncope  Baseline:  Goal status: INITIAL  2.  Patient will perform daily activity with no loss of balance  Baseline:  Goal status: INITIAL  3.  Patient will be able to walk community distances without any discomfort in left hip or feeling of lost balance .  Baseline:  Goal status: INITIAL      PLAN:  PT FREQUENCY: 1-2x/week  PT DURATION: 10 weeks  PLANNED INTERVENTIONS: Therapeutic exercises, Therapeutic activity, Neuromuscular re-education, Balance training, Gait training, Patient/Family education, Self Care, Joint mobilization, Stair training, Aquatic Therapy, Electrical stimulation, Cryotherapy, Moist heat, Taping, Ultrasound, Ionotophoresis 4mg /ml Dexamethasone, and Manual therapy  PLAN FOR NEXT SESSION: Consider TUG, 5 STS test, 6 minute walk, screen balance; adjust goals review HEP    Donnel Saxon Navraj Dreibelbis, PTA 12/15/2022, 9:23 AM

## 2022-12-17 ENCOUNTER — Other Ambulatory Visit (HOSPITAL_BASED_OUTPATIENT_CLINIC_OR_DEPARTMENT_OTHER): Payer: Self-pay | Admitting: Cardiovascular Disease

## 2022-12-17 DIAGNOSIS — I951 Orthostatic hypotension: Secondary | ICD-10-CM

## 2022-12-17 DIAGNOSIS — I10 Essential (primary) hypertension: Secondary | ICD-10-CM

## 2022-12-20 ENCOUNTER — Ambulatory Visit (HOSPITAL_BASED_OUTPATIENT_CLINIC_OR_DEPARTMENT_OTHER): Payer: Medicare HMO

## 2022-12-20 ENCOUNTER — Encounter (HOSPITAL_BASED_OUTPATIENT_CLINIC_OR_DEPARTMENT_OTHER): Payer: Self-pay

## 2022-12-22 ENCOUNTER — Encounter (HOSPITAL_BASED_OUTPATIENT_CLINIC_OR_DEPARTMENT_OTHER): Payer: Self-pay | Admitting: Physical Therapy

## 2022-12-22 ENCOUNTER — Ambulatory Visit (HOSPITAL_BASED_OUTPATIENT_CLINIC_OR_DEPARTMENT_OTHER): Payer: Medicare HMO | Admitting: Physical Therapy

## 2022-12-22 DIAGNOSIS — R2681 Unsteadiness on feet: Secondary | ICD-10-CM | POA: Diagnosis not present

## 2022-12-22 DIAGNOSIS — R262 Difficulty in walking, not elsewhere classified: Secondary | ICD-10-CM | POA: Diagnosis not present

## 2022-12-22 DIAGNOSIS — R42 Dizziness and giddiness: Secondary | ICD-10-CM

## 2022-12-22 NOTE — Therapy (Signed)
OUTPATIENT PHYSICAL THERAPY LOWER EXTREMITY Treatment    Patient Name: Jason [name] MRN: [id number] DOB:1935/12/06, 87 y.o., male Today's Date: 12/15/2022  END OF SESSION:  PT End of Session - 12/15/22 0921     Visit Number 3    Number of Visits 16    Date for PT Re-Evaluation 01/27/23    PT Start Time 0845    PT Stop Time 0915    PT Time Calculation (min) 30 min    Activity Tolerance Patient limited by fatigue    Behavior During Therapy [facility] for tasks assessed/performed               Past Medical History:  Diagnosis Date   Ascending aortic aneurysm (HCC) 01/22/2022   CAD in native artery 01/22/2022   Cancer (HCC)    Cataract    Diabetes mellitus without complication (HCC)    DM (diabetes mellitus) (HCC)    Gait instability 01/22/2022   Hypertension    Sleep apnea    History reviewed. No pertinent surgical history. Patient Active Problem List   Diagnosis Date Noted   Obstructive sleep apnea of adult 08/11/2022   Diabetic nephropathy with proteinuria (HCC) 08/11/2022   Obesity 08/11/2022   Hyperlipidemia 08/11/2022   Chronic kidney disease, stage 3a (HCC) 08/11/2022   Anxiety disorder, unspecified 08/11/2022   Diabetes mellitus (HCC) 08/11/2022   Primary malignant neoplasm of prostate (HCC) 08/02/2022   Carcinoma of prostate (HCC) 08/02/2022   Shortness of breath 02/22/2022   Pure hypercholesterolemia 02/22/2022   Pain in joint, shoulder region 02/22/2022   Other malaise and fatigue 02/22/2022   Nonexudative age-related macular degeneration, bilateral, intermediate dry stage 02/22/2022   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   Gastroesophageal reflux disease 02/22/2022   Benign paroxysmal positional vertigo 02/22/2022   CAD in native artery 01/22/2022   Thoracic aortic aneurysm, without rupture, unspecified (HCC) 01/22/2022   Gait instability 01/22/2022   Essential hypertension 01/17/2022   Type 2 diabetes mellitus without complications (HCC) 01/17/2022   HLD  (hyperlipidemia) 01/17/2022   Obstructive sleep apnea 01/17/2022   Sensorineural hearing loss, bilateral 11/09/2021   Orthostatic hypotension 11/09/2021   Imbalance 11/09/2021   Overweight 02/21/2020   First degree heart block 02/21/2020   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   Malignant tumor of prostate (HCC) 02/21/2020   Vitamin D deficiency 05/31/2016   History of malignant neoplasm of prostate 05/31/2016   History of malignant neoplasm of skin 05/31/2016   Bilateral hearing loss 05/31/2016   Anxiety state 05/31/2016   Bronchospasm 09/24/2010   Cough 09/19/2010   Acute bronchitis 09/19/2010   Allergic rhinitis due to pollen 09/19/2010   Acute sinusitis 09/19/2010    PCP: Meredith Staggers, MD  REFERRING PROVIDER: Meredith Staggers, MD  REFERRING DIAG: R26.81 (ICD-10-CM) - Unsteadiness   THERAPY DIAG:  Unsteadiness on feet  Dizziness and giddiness  Difficulty in walking, not elsewhere classified  Rationale for Evaluation and Treatment: Rehabilitation  ONSET DATE: march 2024  SUBJECTIVE:   SUBJECTIVE STATEMENT: The patient reports he may be feeling a little better.  PERTINENT HISTORY: Type 2 diabetes, sleep apnea, thoracic aortic aneurysm, orthorstatic hypotension  PAIN:  Are you having pain? No  PRECAUTIONS: None  WEIGHT BEARING RESTRICTIONS: No  FALLS:  Has patient fallen in last 6 months? Yes. Number of falls 2, step done on a small step. Another one reaching down and fell  LIVING ENVIRONMENT: Lives with: lives with their family Lives in: House/apartment Stairs: No Has following equipment at home: Single point cane  OCCUPATION: retired   PLOF: Independent  PATIENT GOALS: walking without pain, being more stable  NEXT MD VISIT: nothing scheduled   OBJECTIVE:      DIAGNOSTIC FINDINGS: nothing pertinent   PATIENT SURVEYS:  FOTO    COGNITION: Overall cognitive status: Within functional limits for tasks assessed     SENSATION: WFL  EDEMA:    MUSCLE LENGTH:   POSTURE: rounded shoulders  PALPATION:   LOWER EXTREMITY ROM:  Active ROM Right eval Left eval  Hip flexion    Hip extension    Hip abduction    Hip adduction    Hip internal rotation    Hip external rotation    Knee flexion    Knee extension    Ankle dorsiflexion    Ankle plantarflexion    Ankle inversion    Ankle eversion     (Blank rows = not tested)  LOWER EXTREMITY MMT:  MMT Right eval Left eval  Hip flexion 19.5 19.3 painful  Hip extension    Hip abduction 22.5 27.1  Hip adduction    Hip internal rotation    Hip external rotation    Knee flexion    Knee extension 27.4 33.2  Ankle dorsiflexion    Ankle plantarflexion    Ankle inversion    Ankle eversion     (Blank rows = not tested)  LOWER EXTREMITY SPECIAL TESTS:    5/1: Vitals 139/72mmHg 70bpm  4/26: Vitals: 166/108 at beginning Waited and took again: 168/96 Standing: 133/852 After seated exercise: 181/112 After rest:165/108 At end of session: 167/104  EVAL: Vitals:  Patient Initial blood pressure seated was 165/118.  Patient blood pressure was retaken after a few minutes and was 171/103. Patient blood pressure was also taken standing, 134/78 FUNCTIONAL TESTS:  TUG test: 16sec- No AD 5xSTS: 27sec (used arms to push up)  GAIT: Patients uses a SPC to ambulate.  Balance:  Will assess next visit when orthostatics are more controlled   TODAY'S TREATMENT:                                                                                                                              DATE:  5/8 Baseline B/P 158/98  Nu-step 5 min L3   Narrow base eyes open and eyes closed 2x30 sec hold Tandem stance eyes open and eyes closed 2x30 sec hold   Coordinated stepping forward and lateral x15 each   Heel /toe x20    5/1 Seated hip adduction with ball sqz 5" hold 2x10 Seated clam with RTB 2x10 Seated march with RTB 2x10  4/26:   Seated hip abduction with red band  x20 Seated knee extension 2x10ea Seated hip flexion with red band x20  TUG 5xSTS  PATIENT EDUCATION:  Education details: POC, Symptom management, HEP Person educated: Patient Education method: Explanation, Demonstration, Tactile cues, Verbal cues, and Handouts Education comprehension: verbalized understanding, returned demonstration, verbal cues required, tactile cues required, and needs further education  HOME EXERCISE PROGRAM: Access Code: [email or web address] URL: [email or web address] Date: 12/02/2022 Prepared by: Lorayne Bender  Exercises - Seated Hip Abduction with Resistance  - 1 x daily - 7 x weekly - 3 sets - 10 reps - Seated Knee Extension with Resistance  - 1 x daily - 7 x weekly - 3 sets - 10 reps - Seated Knee Lifts with Resistance  - 1 x daily - 7 x weekly - 3 sets - 10 reps  ASSESSMENT:  CLINICAL IMPRESSION: BP was measured at beginning of session at 155/98 mmHg. Ending 140/89. Patient was more tolerant to activitiy. We worked Oncologist on Hydrologist. He had a drop in B/P   OBJECTIVE IMPAIRMENTS: decreased activity tolerance, decreased balance, decreased endurance, difficulty walking, decreased strength, and dizziness.   ACTIVITY LIMITATIONS: carrying, lifting, bending, standing, squatting, sleeping, and stairs  PARTICIPATION LIMITATIONS: meal prep, cleaning, laundry, driving, shopping, community activity, and yard work  PERSONAL FACTORS: 3+ comorbidities: type 2 diabetes, orthostatic hypotension, sleep apnea, thoracic aortic anuerysm  are also affecting patient's functional outcome.   REHAB POTENTIAL: Good  CLINICAL DECISION MAKING: Evolving/moderate complexity  EVALUATION COMPLEXITY: Moderate   GOALS: Goals reviewed with patient? Yes  SHORT TERM GOALS: Target date: 5/9 Patient will increase bilateral strength by 5lbs. Baseline: Goal status: INITIAL  2.  Patient will be independent with HEP. Baseline:  Goal status: INITIAL  3.  Patient  will show minimal fluctuation and syncope with sit to stand transfer  Baseline:  Goal status: INITIAL   LONG TERM GOALS: Target date: 01/27/2023    Patient will transfer sit to stand without loss of balance or syncope  Baseline:  Goal status: INITIAL  2.  Patient will perform daily activity with no loss of balance  Baseline:  Goal status: INITIAL  3.  Patient will be able to walk community distances without any discomfort in left hip or feeling of lost balance .  Baseline:  Goal status: INITIAL      PLAN:  PT FREQUENCY: 1-2x/week  PT DURATION: 10 weeks  PLANNED INTERVENTIONS: Therapeutic exercises, Therapeutic activity, Neuromuscular re-education, Balance training, Gait training, Patient/Family education, Self Care, Joint mobilization, Stair training, Aquatic Therapy, Electrical stimulation, Cryotherapy, Moist heat, Taping, Ultrasound, Ionotophoresis 4mg /ml Dexamethasone, and Manual therapy  PLAN FOR NEXT SESSION: Consider TUG, 5 STS test, 6 minute walk, screen balance; adjust goals review HEP   Lorayne Bender PT  12/22/2022

## 2022-12-23 ENCOUNTER — Ambulatory Visit (INDEPENDENT_AMBULATORY_CARE_PROVIDER_SITE_OTHER): Payer: Medicare HMO | Admitting: *Deleted

## 2022-12-23 DIAGNOSIS — Z0000 Encounter for general adult medical examination without abnormal findings: Secondary | ICD-10-CM

## 2022-12-23 NOTE — Progress Notes (Signed)
Subjective:   Jason [name] is a 87 y.o. male who presents for Medicare Annual/Subsequent preventive examination.  I connected with  Hridhaan Keir on 12/23/22 by a telephone enabled telemedicine application and verified that I am speaking with the correct person using two identifiers.   I discussed the limitations of evaluation and management by telemedicine. The patient expressed understanding and agreed to proceed.  Patient location: home  Provider location: telephone/home    Review of Systems     Cardiac Risk Factors include: advanced age (>48men, >14 women);diabetes mellitus;male gender;obesity (BMI >30kg/m2);sedentary lifestyle;hypertension     Objective:    Today's Vitals   There is no height or weight on file to calculate BMI.     12/23/2022    9:13 AM 12/02/2022    8:58 AM 02/23/2022    1:47 PM 12/09/2021    1:12 PM  Advanced Directives  Does Patient Have a Medical Advance Directive? Yes Yes Yes Yes  Type of Estate agent of [employer] Power of [place];Living will Healthcare Power of [place];Living will Healthcare Power of [place];Living will  Does patient want to make changes to medical advance directive?  Yes (ED - Information included in AVS)    Copy of Healthcare Power of Attorney in Chart? No - copy requested  No - copy requested No - copy requested    Current Medications (verified) Outpatient Encounter Medications as of 12/23/2022  Medication Sig   allopurinol (ZYLOPRIM) 300 MG tablet TAKE 1 TABLET BY MOUTH EVERY DAY   aspirin EC 81 MG tablet Take 1 tablet every day by oral route.   atorvastatin (LIPITOR) 40 MG tablet TAKE 1 TABLET BY MOUTH EVERY DAY   b complex vitamins capsule Take 1 capsule by mouth daily.   glimepiride (AMARYL) 1 MG tablet TAKE 1 TABLET BY MOUTH DAILY WITH BREAKFAST.   glucose blood test strip E11.9 Use to test blood sugar twice daily or as needed   losartan (COZAAR) 25 MG tablet Take 1 tablet (25 mg total) by  mouth at bedtime.   metFORMIN (GLUCOPHAGE) 500 MG tablet TAKE 2 TABLETS (1,000 MG TOTAL) BY MOUTH 2 (TWO) TIMES DAILY WITH A MEAL.   metoprolol tartrate (LOPRESSOR) 25 MG tablet Take 12.5 mg by mouth 2 (two) times daily. 1/2 tablet 2 times daily   midodrine (PROAMATINE) 10 MG tablet TAKE 1 TABLET BY MOUTH BEFORE GETTING OUT OF BED OR SOON AFTER, 1 TABLET AT NOON, AND 1 TABLET AROUND 4PM   Multiple Vitamins-Minerals (ICAPS AREDS 2 PO) TAKE 1 CAPSULE BY MOUTH TWICE A DAY FOR MACULAR DEGENERATION   omeprazole (PRILOSEC) 20 MG capsule TAKE 1 CAPSULE BY MOUTH EVERY DAY   OneTouch Delica Lancets 33G MISC E11.9 Use to test blood sugar twice daily or as needed   pioglitazone (ACTOS) 15 MG tablet TAKE 1 TABLET (15 MG TOTAL) BY MOUTH DAILY.   venlafaxine (EFFEXOR) 75 MG tablet TAKE 1 TABLET BY MOUTH EVERY DAY   No facility-administered encounter medications on file as of 12/23/2022.    Allergies (verified) Lisinopril   History: Past Medical History:  Diagnosis Date   Ascending aortic aneurysm (HCC) 01/22/2022   CAD in native artery 01/22/2022   Cancer [facility])    Cataract    Diabetes mellitus without complication (HCC)    DM (diabetes mellitus) (HCC)    Gait instability 01/22/2022   Hypertension    Sleep apnea    History reviewed. No pertinent surgical history. Family History  Problem Relation Age of Onset  Heart attack Mother    Cancer Father    Cancer Sister    Social History   Socioeconomic History   Marital status: Married    Spouse name: Not on file   Number of children: Not on file   Years of education: Not on file   Highest education level: Not on file  Occupational History   Not on file  Tobacco Use   Smoking status: Former    Types: Pipe   Smokeless tobacco: Never  Substance and Sexual Activity   Alcohol use: Yes    Alcohol/week: 2.0 standard drinks of alcohol    Types: 2 Glasses of wine per week   Drug use: Never   Sexual activity: Never  Other Topics Concern    Not on file  Social History Narrative   Not on file   Social Determinants of Health   Financial Resource Strain: Low Risk  (12/23/2022)   Overall Financial Resource Strain (CARDIA)    Difficulty of Paying Living Expenses: Not hard at all  Food Insecurity: No Food Insecurity (12/23/2022)   Hunger Vital Sign    Worried About Running Out of Food in the Last Year: Never true    Ran Out of Food in the Last Year: Never true  Transportation Needs: No Transportation Needs (12/23/2022)   PRAPARE - Administrator, Civil Service (Medical): No    Lack of Transportation (Non-Medical): No  Physical Activity: Inactive (12/23/2022)   Exercise Vital Sign    Days of Exercise per Week: 0 days    Minutes of Exercise per Session: 0 min  Stress: No Stress Concern Present (12/23/2022)   [employer] of Occupational Health - Occupational Stress Questionnaire    Feeling of Stress : Not at all  Social Connections: Moderately Isolated (12/23/2022)   Social Connection and Isolation Panel [NHANES]    Frequency of Communication with Friends and Family: More than three times a week    Frequency of Social Gatherings with Friends and Family: Once a week    Attends Religious Services: Never    Database administrator or Organizations: No    Attends Engineer, structural: Never    Marital Status: Married    Tobacco Counseling Counseling given: Not Answered   Clinical Intake:  Pre-visit preparation completed: Yes  Pain : No/denies pain     Diabetes: Yes CBG done?: No Did pt. bring in CBG monitor from home?: No  How often do you need to have someone help you when you read instructions, pamphlets, or other written materials from your doctor or pharmacy?: 1 - Never  Diabetic?  Yes  Nutrition Risk Assessment:  Has the patient had any N/V/D within the last 2 months?  No  Does the patient have any non-healing wounds?  No  Has the patient had any unintentional weight loss or weight gain?   No   Diabetes:  Is the patient diabetic?  Yes  If diabetic, was a CBG obtained today?  No  Did the patient bring in their glucometer from home?  No  How often do you monitor your CBG's? 1 x a day.   Financial Strains and Diabetes Management:  Are you having any financial strains with the device, your supplies or your medication? No .  Does the patient want to be seen by Chronic Care Management for management of their diabetes?  No  Would the patient like to be referred to a Nutritionist or for Diabetic Management?  No  Diabetic Exams:  Diabetic Eye Exam: Completed . Pt has been advised about the importance in completing this exam. A referral has been placed today. Message sent to referral coordinator for scheduling purposes. Advised pt to expect a call from office referred to regarding appt.  Diabetic Foot Exam: Completed Pt has been advised about the importance in completing this exam.   Interpreter Needed?: No  Information entered by :: Remi Haggard LPN   Activities of Daily Living    12/23/2022    9:15 AM 12/21/2022    8:56 AM  In your present state of health, do you have any difficulty performing the following activities:  Hearing? 1 0  Vision? 0 0  Difficulty concentrating or making decisions? 0 0  Walking or climbing stairs? 1 1  Dressing or bathing? 0 0  Doing errands, shopping? 0 0  Preparing Food and eating ? N N  Using the Toilet? N N  In the past six months, have you accidently leaked urine? Y Y  Do you have problems with loss of bowel control? N N  Managing your Medications? N N  Managing your Finances? N N  Housekeeping or managing your Housekeeping? N N    Patient Care Team: Shade Flood, MD as PCP - General (Family Medicine) Chilton Si, MD as PCP - Cardiology (Cardiology)  Indicate any recent Medical Services you may have received from other than [facility] providers in the past year (date may be approximate).     Assessment:   This is a routine  wellness examination for Jason [name].  Hearing/Vision screen Hearing Screening - Comments:: Has hearing aids Vision Screening - Comments:: Not up to date [place]  Dietary issues and exercise activities discussed: Current Exercise Habits: The patient does not participate in regular exercise at present   Goals Addressed             This Visit's Progress    Patient Stated       No goals       Depression Screen    12/23/2022    9:21 AM 10/25/2022    8:53 AM 09/13/2022   10:00 AM 08/02/2022   10:37 AM 07/14/2022    3:15 PM 04/07/2022    8:14 AM 04/07/2022    8:09 AM  PHQ 2/9 Scores  PHQ - 2 Score 0 0 0 0 0 3 3  PHQ- 9 Score 2 2 0 0 0 3 6    Fall Risk    12/23/2022    9:14 AM 12/21/2022    8:56 AM 12/15/2022    4:22 PM 10/25/2022    8:52 AM 09/13/2022   10:00 AM  Fall Risk   Falls in the past year? 1 1 1 1 1   Number falls in past yr: 1 1 1  0 1  Injury with Fall? 0 0 0 0 1  Risk for fall due to : History of fall(s);Impaired balance/gait   History of fall(s) History of fall(s)  Follow up Falls evaluation completed;Education provided;Falls prevention discussed   Falls evaluation completed Falls evaluation completed    FALL RISK PREVENTION PERTAINING TO THE HOME:  Any stairs in or around the home? No  If so, are there any without handrails? No  Home free of loose throw rugs in walkways, pet beds, electrical cords, etc? Yes  Adequate lighting in your home to reduce risk of falls? Yes   ASSISTIVE DEVICES UTILIZED TO PREVENT FALLS:  Life alert? No  Use of a cane, walker or  w/c? Yes  Grab bars in the bathroom? No  Shower chair or bench in shower? Yes  Elevated toilet seat or a handicapped toilet? Yes   TIMED UP AND GO:  Was the test performed? No .    Cognitive Function:        12/23/2022    9:17 AM  6CIT Screen  What Year? 0 points  What month? 0 points  What time? 0 points  Count back from 20 0 points  Months in reverse 0 points  Repeat phrase 0 points  Total  Score 0 points    Immunizations Immunization History  Administered Date(s) Administered   Fluad Quad(high Dose 65+) 04/16/2022   H1N1 05/13/2015   Influenza Split 09/24/2010, 04/23/2011   Influenza, High Dose Seasonal PF 05/01/2018, 04/29/2021   Influenza,inj,quad, With Preservative 04/25/2019   Influenza-Unspecified 04/17/2011, 04/16/2012, 04/16/2013, 06/06/2013, 04/16/2017, 04/16/2018   Moderna Sars-Covid-2 Vaccination 09/20/2019, 10/18/2019, 06/26/2020   Pneumococcal Conjugate-13 05/23/2014   Pneumococcal Polysaccharide-23 09/19/2010   Pneumococcal-Unspecified 05/18/2011   Respiratory Syncytial Virus Vaccine,Recomb Aduvanted(Arexvy) 04/16/2022   Td (Adult),unspecified 05/18/2011   Tdap 06/06/2013   Tetanus 08/16/2009, 05/18/2011   Zoster Recombinat (Shingrix) 04/14/2020, 05/23/2020, 07/24/2020   Zoster, Live 02/15/2013    TDAP status: Up to date  Flu Vaccine status: Up to date  Pneumococcal vaccine status: Up to date  Covid-19 vaccine status: Information provided on how to obtain vaccines.   Qualifies for Shingles Vaccine? No   Zostavax completed Yes   Shingrix Completed?: Yes  Screening Tests Health Maintenance  Topic Date Due   OPHTHALMOLOGY EXAM  10/01/2022   FOOT EXAM  03/05/2023   INFLUENZA VACCINE  03/17/2023   HEMOGLOBIN A1C  04/27/2023   Diabetic kidney evaluation - eGFR measurement  10/25/2023   Diabetic kidney evaluation - Urine ACR  10/25/2023   Medicare Annual Wellness (AWV)  12/23/2023   Pneumonia Vaccine 101+ Years old  Completed   Zoster Vaccines- Shingrix  Completed   HPV VACCINES  Aged Out   DTaP/Tdap/Td  Discontinued   COVID-19 Vaccine  Discontinued    Health Maintenance  Health Maintenance Due  Topic Date Due   OPHTHALMOLOGY EXAM  10/01/2022    Colorectal cancer screening: No longer required.   Lung Cancer Screening: (Low Dose CT Chest recommended if Age 51-80 years, 30 pack-year currently smoking OR have quit w/in 15years.) does not  qualify.   Lung Cancer Screening Referral:   Additional Screening:  Hepatitis C Screening: does not qualify; Completed   Vision Screening: Recommended annual ophthalmology exams for early detection of glaucoma and other disorders of the eye. Is the patient up to date with their annual eye exam?  No  Who is the provider or what is the name of the office in which the patient attends annual eye exams? Up to date If pt is not established with a provider, would they like to be referred to a provider to establish care? No .   Dental Screening: Recommended annual dental exams for proper oral hygiene  Community Resource Referral / Chronic Care Management: CRR required this visit?  No   CCM required this visit?  No      Plan:     I have personally reviewed and noted the following in the patient's chart:   Medical and social history Use of alcohol, tobacco or illicit drugs  Current medications and supplements including opioid prescriptions. Patient is not currently taking opioid prescriptions. Functional ability and status Nutritional status Physical activity Advanced directives List of other  physicians Hospitalizations, surgeries, and ER visits in previous 12 months Vitals Screenings to include cognitive, depression, and falls Referrals and appointments  In addition, I have reviewed and discussed with patient certain preventive protocols, quality metrics, and best practice recommendations. A written personalized care plan for preventive services as well as general preventive health recommendations were provided to patient.     Remi Haggard, LPN   12/16/8411   Nurse Notes:

## 2022-12-31 ENCOUNTER — Ambulatory Visit (HOSPITAL_BASED_OUTPATIENT_CLINIC_OR_DEPARTMENT_OTHER): Payer: Medicare HMO | Admitting: Physical Therapy

## 2022-12-31 ENCOUNTER — Encounter (HOSPITAL_BASED_OUTPATIENT_CLINIC_OR_DEPARTMENT_OTHER): Payer: Self-pay | Admitting: Physical Therapy

## 2022-12-31 DIAGNOSIS — R2681 Unsteadiness on feet: Secondary | ICD-10-CM | POA: Diagnosis not present

## 2022-12-31 DIAGNOSIS — R262 Difficulty in walking, not elsewhere classified: Secondary | ICD-10-CM | POA: Diagnosis not present

## 2022-12-31 DIAGNOSIS — R42 Dizziness and giddiness: Secondary | ICD-10-CM

## 2022-12-31 NOTE — Therapy (Signed)
OUTPATIENT PHYSICAL THERAPY LOWER EXTREMITY Treatment    Patient Name: Jason [name] MRN: [id number] DOB:27-Apr-1936, 87 y.o., male Today's Date: 12/31/2022  END OF SESSION:  PT End of Session - 12/31/22 1139     Visit Number 5    Number of Visits 16    Date for PT Re-Evaluation 01/27/23    PT Start Time 1135    PT Stop Time 1216    PT Time Calculation (min) 41 min    Activity Tolerance Patient tolerated treatment well    Behavior During Therapy [facility] for tasks assessed/performed               Past Medical History:  Diagnosis Date   Ascending aortic aneurysm (HCC) 01/22/2022   CAD in native artery 01/22/2022   Cancer (HCC)    Cataract    Diabetes mellitus without complication (HCC)    DM (diabetes mellitus) (HCC)    Gait instability 01/22/2022   Hypertension    Sleep apnea    History reviewed. No pertinent surgical history. Patient Active Problem List   Diagnosis Date Noted   Obstructive sleep apnea of adult 08/11/2022   Diabetic nephropathy with proteinuria (HCC) 08/11/2022   Obesity 08/11/2022   Hyperlipidemia 08/11/2022   Chronic kidney disease, stage 3a (HCC) 08/11/2022   Anxiety disorder, unspecified 08/11/2022   Diabetes mellitus (HCC) 08/11/2022   Primary malignant neoplasm of prostate (HCC) 08/02/2022   Carcinoma of prostate (HCC) 08/02/2022   Shortness of breath 02/22/2022   Pure hypercholesterolemia 02/22/2022   Pain in joint, shoulder region 02/22/2022   Other malaise and fatigue 02/22/2022   Nonexudative age-related macular degeneration, bilateral, intermediate dry stage 02/22/2022   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   Gastroesophageal reflux disease 02/22/2022   Benign paroxysmal positional vertigo 02/22/2022   CAD in native artery 01/22/2022   Thoracic aortic aneurysm, without rupture, unspecified (HCC) 01/22/2022   Gait instability 01/22/2022   Essential hypertension 01/17/2022   Type 2 diabetes mellitus without complications (HCC) 01/17/2022   HLD  (hyperlipidemia) 01/17/2022   Obstructive sleep apnea 01/17/2022   Sensorineural hearing loss, bilateral 11/09/2021   Orthostatic hypotension 11/09/2021   Imbalance 11/09/2021   Overweight 02/21/2020   First degree heart block 02/21/2020   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   Malignant tumor of prostate (HCC) 02/21/2020   Vitamin D deficiency 05/31/2016   History of malignant neoplasm of prostate 05/31/2016   History of malignant neoplasm of skin 05/31/2016   Bilateral hearing loss 05/31/2016   Anxiety state 05/31/2016   Bronchospasm 09/24/2010   Cough 09/19/2010   Acute bronchitis 09/19/2010   Allergic rhinitis due to pollen 09/19/2010   Acute sinusitis 09/19/2010    PCP: Meredith Staggers, MD  REFERRING PROVIDER: Meredith Staggers, MD  REFERRING DIAG: R26.81 (ICD-10-CM) - Unsteadiness   THERAPY DIAG:  Unsteadiness on feet  Dizziness and giddiness  Difficulty in walking, not elsewhere classified  Rationale for Evaluation and Treatment: Rehabilitation  ONSET DATE: march 2024  SUBJECTIVE:   SUBJECTIVE STATEMENT: No complaints. Maybe minor left hip pain.  PERTINENT HISTORY: Type 2 diabetes, sleep apnea, thoracic aortic aneurysm, orthorstatic hypotension  PAIN:  Are you having pain? No  PRECAUTIONS: None  WEIGHT BEARING RESTRICTIONS: No  FALLS:  Has patient fallen in last 6 months? Yes. Number of falls 2, step done on a small step. Another one reaching down and fell  LIVING ENVIRONMENT: Lives with: lives with their family Lives in: House/apartment Stairs: No Has following equipment at home: Single point cane  OCCUPATION: retired  PLOF: Independent  PATIENT GOALS: walking without pain, being more stable  NEXT MD VISIT: nothing scheduled   OBJECTIVE:      DIAGNOSTIC FINDINGS: nothing pertinent   PATIENT SURVEYS:  FOTO    COGNITION: Overall cognitive status: Within functional limits for tasks assessed     SENSATION: WFL  EDEMA:   MUSCLE  LENGTH:   POSTURE: rounded shoulders  PALPATION:   LOWER EXTREMITY ROM:  Active ROM Right eval Left eval  Hip flexion    Hip extension    Hip abduction    Hip adduction    Hip internal rotation    Hip external rotation    Knee flexion    Knee extension    Ankle dorsiflexion    Ankle plantarflexion    Ankle inversion    Ankle eversion     (Blank rows = not tested)  LOWER EXTREMITY MMT:  MMT Right eval Left eval  Hip flexion 19.5 19.3 painful  Hip extension    Hip abduction 22.5 27.1  Hip adduction    Hip internal rotation    Hip external rotation    Knee flexion    Knee extension 27.4 33.2  Ankle dorsiflexion    Ankle plantarflexion    Ankle inversion    Ankle eversion     (Blank rows = not tested)  LOWER EXTREMITY SPECIAL TESTS:    5/1: Vitals 139/57mmHg 70bpm  4/26: Vitals: 166/108 at beginning Waited and took again: 168/96 Standing: 133/852 After seated exercise: 181/112 After rest:165/108 At end of session: 167/104  EVAL: Vitals:  Patient Initial blood pressure seated was 165/118.  Patient blood pressure was retaken after a few minutes and was 171/103. Patient blood pressure was also taken standing, 134/78 FUNCTIONAL TESTS:  TUG test: 16sec- No AD 5xSTS: 27sec (used arms to push up)  GAIT: Patients uses a SPC to ambulate.  Balance:  Will assess next visit when orthostatics are more controlled   TODAY'S TREATMENT:                                                                                                                              DATE:  5/17  Baseline B/P 160/99   Narrow base eyes open and eyes closed 2x30 sec hold Tandem stance eyes open and eyes closed 2x30 sec hold   Coordinated stepping forward and lateral x15 each   Heel /toe x20  Standing march x20    Hurdle step over  148/99 post     5/8 Baseline B/P 158/98  Nu-step 5 min L3   Narrow base eyes open and eyes closed 2x30 sec hold Tandem stance eyes open  and eyes closed 2x30 sec hold   Coordinated stepping forward and lateral x15 each   Heel /toe x20    5/1 Seated hip adduction with ball sqz 5" hold 2x10 Seated clam with RTB 2x10 Seated march with RTB 2x10  4/26:   Seated hip abduction with red band  x20 Seated knee extension 2x10ea Seated hip flexion with red band x20  TUG 5xSTS  PATIENT EDUCATION:  Education details: POC, Symptom management, HEP Person educated: Patient Education method: Explanation, Demonstration, Tactile cues, Verbal cues, and Handouts Education comprehension: verbalized understanding, returned demonstration, verbal cues required, tactile cues required, and needs further education  HOME EXERCISE PROGRAM: Access Code: [email or web address] URL: [email or web address] Date: 12/02/2022 Prepared by: Lorayne Bender  Exercises - Seated Hip Abduction with Resistance  - 1 x daily - 7 x weekly - 3 sets - 10 reps - Seated Knee Extension with Resistance  - 1 x daily - 7 x weekly - 3 sets - 10 reps - Seated Knee Lifts with Resistance  - 1 x daily - 7 x weekly - 3 sets - 10 reps  ASSESSMENT:  CLINICAL IMPRESSION: The patient continues to have a high b/p but therapy dosnt seem to effect it. We continue to focus on balance activity rather then exercises to try to keep his B/P stable. We added hurdle step overs this visit. We will continue to progress as tolerated.   OBJECTIVE IMPAIRMENTS: decreased activity tolerance, decreased balance, decreased endurance, difficulty walking, decreased strength, and dizziness.   ACTIVITY LIMITATIONS: carrying, lifting, bending, standing, squatting, sleeping, and stairs  PARTICIPATION LIMITATIONS: meal prep, cleaning, laundry, driving, shopping, community activity, and yard work  PERSONAL FACTORS: 3+ comorbidities: type 2 diabetes, orthostatic hypotension, sleep apnea, thoracic aortic anuerysm  are also affecting patient's functional outcome.   REHAB POTENTIAL: Good  CLINICAL  DECISION MAKING: Evolving/moderate complexity  EVALUATION COMPLEXITY: Moderate   GOALS: Goals reviewed with patient? Yes  SHORT TERM GOALS: Target date: 5/9 Patient will increase bilateral strength by 5lbs. Baseline: Goal status: INITIAL  2.  Patient will be independent with HEP. Baseline:  Goal status: INITIAL  3.  Patient will show minimal fluctuation and syncope with sit to stand transfer  Baseline:  Goal status: INITIAL   LONG TERM GOALS: Target date: 01/27/2023    Patient will transfer sit to stand without loss of balance or syncope  Baseline:  Goal status: INITIAL  2.  Patient will perform daily activity with no loss of balance  Baseline:  Goal status: INITIAL  3.  Patient will be able to walk community distances without any discomfort in left hip or feeling of lost balance .  Baseline:  Goal status: INITIAL      PLAN:  PT FREQUENCY: 1-2x/week  PT DURATION: 10 weeks  PLANNED INTERVENTIONS: Therapeutic exercises, Therapeutic activity, Neuromuscular re-education, Balance training, Gait training, Patient/Family education, Self Care, Joint mobilization, Stair training, Aquatic Therapy, Electrical stimulation, Cryotherapy, Moist heat, Taping, Ultrasound, Ionotophoresis 4mg /ml Dexamethasone, and Manual therapy  PLAN FOR NEXT SESSION: Consider TUG, 5 STS test, 6 minute walk, screen balance; adjust goals review HEP   Lorayne Bender PT  12/31/2022

## 2023-01-02 ENCOUNTER — Other Ambulatory Visit (HOSPITAL_BASED_OUTPATIENT_CLINIC_OR_DEPARTMENT_OTHER): Payer: Self-pay | Admitting: Cardiovascular Disease

## 2023-01-02 DIAGNOSIS — I951 Orthostatic hypotension: Secondary | ICD-10-CM

## 2023-01-02 DIAGNOSIS — I10 Essential (primary) hypertension: Secondary | ICD-10-CM

## 2023-01-03 ENCOUNTER — Ambulatory Visit (HOSPITAL_BASED_OUTPATIENT_CLINIC_OR_DEPARTMENT_OTHER): Payer: Medicare HMO | Admitting: Physical Therapy

## 2023-01-03 ENCOUNTER — Encounter (HOSPITAL_BASED_OUTPATIENT_CLINIC_OR_DEPARTMENT_OTHER): Payer: Self-pay | Admitting: Physical Therapy

## 2023-01-03 DIAGNOSIS — R42 Dizziness and giddiness: Secondary | ICD-10-CM

## 2023-01-03 DIAGNOSIS — R2681 Unsteadiness on feet: Secondary | ICD-10-CM | POA: Diagnosis not present

## 2023-01-03 DIAGNOSIS — R262 Difficulty in walking, not elsewhere classified: Secondary | ICD-10-CM

## 2023-01-03 NOTE — Telephone Encounter (Signed)
Rx(s) sent to pharmacy electronically.  

## 2023-01-03 NOTE — Therapy (Unsigned)
OUTPATIENT PHYSICAL THERAPY LOWER EXTREMITY Treatment    Patient Name: Jason [name] MRN: [id number] DOB:03/23/1936, 87 y.o., male Today's Date: 01/04/2023  END OF SESSION:  PT End of Session - 01/03/23 1405     Visit Number 6    Number of Visits 16    Date for PT Re-Evaluation 01/27/23    PT Start Time 1315    PT Stop Time 1358    PT Time Calculation (min) 43 min    Activity Tolerance Patient tolerated treatment well    Behavior During Therapy [facility] for tasks assessed/performed               Past Medical History:  Diagnosis Date   Ascending aortic aneurysm (HCC) 01/22/2022   CAD in native artery 01/22/2022   Cancer (HCC)    Cataract    Diabetes mellitus without complication (HCC)    DM (diabetes mellitus) (HCC)    Gait instability 01/22/2022   Hypertension    Sleep apnea    History reviewed. No pertinent surgical history. Patient Active Problem List   Diagnosis Date Noted   Obstructive sleep apnea of adult 08/11/2022   Diabetic nephropathy with proteinuria (HCC) 08/11/2022   Obesity 08/11/2022   Hyperlipidemia 08/11/2022   Chronic kidney disease, stage 3a (HCC) 08/11/2022   Anxiety disorder, unspecified 08/11/2022   Diabetes mellitus (HCC) 08/11/2022   Primary malignant neoplasm of prostate (HCC) 08/02/2022   Carcinoma of prostate (HCC) 08/02/2022   Shortness of breath 02/22/2022   Pure hypercholesterolemia 02/22/2022   Pain in joint, shoulder region 02/22/2022   Other malaise and fatigue 02/22/2022   Nonexudative age-related macular degeneration, bilateral, intermediate dry stage 02/22/2022   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   Gastroesophageal reflux disease 02/22/2022   Benign paroxysmal positional vertigo 02/22/2022   CAD in native artery 01/22/2022   Thoracic aortic aneurysm, without rupture, unspecified (HCC) 01/22/2022   Gait instability 01/22/2022   Essential hypertension 01/17/2022   Type 2 diabetes mellitus without complications (HCC) 01/17/2022   HLD  (hyperlipidemia) 01/17/2022   Obstructive sleep apnea 01/17/2022   Sensorineural hearing loss, bilateral 11/09/2021   Orthostatic hypotension 11/09/2021   Imbalance 11/09/2021   Overweight 02/21/2020   First degree heart block 02/21/2020   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   Malignant tumor of prostate (HCC) 02/21/2020   Vitamin D deficiency 05/31/2016   History of malignant neoplasm of prostate 05/31/2016   History of malignant neoplasm of skin 05/31/2016   Bilateral hearing loss 05/31/2016   Anxiety state 05/31/2016   Bronchospasm 09/24/2010   Cough 09/19/2010   Acute bronchitis 09/19/2010   Allergic rhinitis due to pollen 09/19/2010   Acute sinusitis 09/19/2010    PCP: Meredith Staggers, MD  REFERRING PROVIDER: Meredith Staggers, MD  REFERRING DIAG: R26.81 (ICD-10-CM) - Unsteadiness   THERAPY DIAG:  Unsteadiness on feet  Dizziness and giddiness  Difficulty in walking, not elsewhere classified  Rationale for Evaluation and Treatment: Rehabilitation  ONSET DATE: march 2024  SUBJECTIVE:   SUBJECTIVE STATEMENT: Patient's appointment is later in the day than it usually is.  He has not had a chance to take his second pill yet.  He reports overall he feels like he is more stable.  He feels like the last few sessions have been beneficial. PERTINENT HISTORY: Type 2 diabetes, sleep apnea, thoracic aortic aneurysm, orthorstatic hypotension  PAIN:  Are you having pain? No  PRECAUTIONS: None  WEIGHT BEARING RESTRICTIONS: No  FALLS:  Has patient fallen in last 6 months? Yes. Number of falls 2,  step done on a small step. Another one reaching down and fell  LIVING ENVIRONMENT: Lives with: lives with their family Lives in: House/apartment Stairs: No Has following equipment at home: Single point cane  OCCUPATION: retired   PLOF: Independent  PATIENT GOALS: walking without pain, being more stable  NEXT MD VISIT: nothing scheduled   OBJECTIVE:      DIAGNOSTIC FINDINGS:  nothing pertinent   PATIENT SURVEYS:  FOTO    COGNITION: Overall cognitive status: Within functional limits for tasks assessed     SENSATION: WFL  EDEMA:   MUSCLE LENGTH:   POSTURE: rounded shoulders  PALPATION:   LOWER EXTREMITY ROM:  Active ROM Right eval Left eval  Hip flexion    Hip extension    Hip abduction    Hip adduction    Hip internal rotation    Hip external rotation    Knee flexion    Knee extension    Ankle dorsiflexion    Ankle plantarflexion    Ankle inversion    Ankle eversion     (Blank rows = not tested)  LOWER EXTREMITY MMT:  MMT Right eval Left eval  Hip flexion 19.5 19.3 painful  Hip extension    Hip abduction 22.5 27.1  Hip adduction    Hip internal rotation    Hip external rotation    Knee flexion    Knee extension 27.4 33.2  Ankle dorsiflexion    Ankle plantarflexion    Ankle inversion    Ankle eversion     (Blank rows = not tested)  LOWER EXTREMITY SPECIAL TESTS:    5/1: Vitals 139/74mmHg 70bpm  4/26: Vitals: 166/108 at beginning Waited and took again: 168/96 Standing: 133/852 After seated exercise: 181/112 After rest:165/108 At end of session: 167/104  EVAL: Vitals:  Patient Initial blood pressure seated was 165/118.  Patient blood pressure was retaken after a few minutes and was 171/103. Patient blood pressure was also taken standing, 134/78 FUNCTIONAL TESTS:  TUG test: 16sec- No AD 5xSTS: 27sec (used arms to push up)  GAIT: Patients uses a SPC to ambulate.  Balance:  Will assess next visit when orthostatics are more controlled   TODAY'S TREATMENT:                                                                                                                              DATE:  5/21  Baseline BP 128/82  NuStep 5 minutes level 3  Heel /toe x20  Standing march x20   Narrow base eyes open and eyes closed 2x30 sec hold At this point patient reported increased syncope after transferring sit to  stand.  He reports that he feels like is because of his he has not been able to take a second round of blood pressure medication.  He did feel like he could do more exercises so he did seated exercises instead  Seated hip abduction with red band 2 x20 Seated knee extension 3  x10ea Seated hip flexion with red band 2 x20  5/17  Baseline B/P 160/99   Narrow base eyes open and eyes closed 2x30 sec hold Tandem stance eyes open and eyes closed 2x30 sec hold   Coordinated stepping forward and lateral x15 each   Heel /toe x20  Standing march x20    Hurdle step over  148/99 post     PATIENT EDUCATION:  Education details: POC, Symptom management, HEP Person educated: Patient Education method: Explanation, Demonstration, Tactile cues, Verbal cues, and Handouts Education comprehension: verbalized understanding, returned demonstration, verbal cues required, tactile cues required, and needs further education  HOME EXERCISE PROGRAM: Access Code: [email or web address] URL: [email or web address] Date: 12/02/2022 Prepared by: Lorayne Bender  Exercises - Seated Hip Abduction with Resistance  - 1 x daily - 7 x weekly - 3 sets - 10 reps - Seated Knee Extension with Resistance  - 1 x daily - 7 x weekly - 3 sets - 10 reps - Seated Knee Lifts with Resistance  - 1 x daily - 7 x weekly - 3 sets - 10 reps  ASSESSMENT:  CLINICAL IMPRESSION: The patient's baseline BP was at a more normal level but the patient reports this is not a normal level for him.  He reports his medication brings his blood pressure up.  Consequently his orthostatic hypotension was more severe today.  He had increased syncope transferring sit to stand and increase syncope with standing exercises today.  We are still able to work on some sitting exercises.  Most of his further exercises are at a time where he may fall in a better spot as far as his medication levels.  Will continue to progress as tolerated. OBJECTIVE  IMPAIRMENTS: decreased activity tolerance, decreased balance, decreased endurance, difficulty walking, decreased strength, and dizziness.   ACTIVITY LIMITATIONS: carrying, lifting, bending, standing, squatting, sleeping, and stairs  PARTICIPATION LIMITATIONS: meal prep, cleaning, laundry, driving, shopping, community activity, and yard work  PERSONAL FACTORS: 3+ comorbidities: type 2 diabetes, orthostatic hypotension, sleep apnea, thoracic aortic anuerysm  are also affecting patient's functional outcome.   REHAB POTENTIAL: Good  CLINICAL DECISION MAKING: Evolving/moderate complexity  EVALUATION COMPLEXITY: Moderate   GOALS: Goals reviewed with patient? Yes  SHORT TERM GOALS: Target date: 5/9 Patient will increase bilateral strength by 5lbs. Baseline: Goal status: INITIAL  2.  Patient will be independent with HEP. Baseline:  Goal status: INITIAL  3.  Patient will show minimal fluctuation and syncope with sit to stand transfer  Baseline:  Goal status: INITIAL   LONG TERM GOALS: Target date: 01/27/2023    Patient will transfer sit to stand without loss of balance or syncope  Baseline:  Goal status: INITIAL  2.  Patient will perform daily activity with no loss of balance  Baseline:  Goal status: INITIAL  3.  Patient will be able to walk community distances without any discomfort in left hip or feeling of lost balance .  Baseline:  Goal status: INITIAL      PLAN:  PT FREQUENCY: 1-2x/week  PT DURATION: 10 weeks  PLANNED INTERVENTIONS: Therapeutic exercises, Therapeutic activity, Neuromuscular re-education, Balance training, Gait training, Patient/Family education, Self Care, Joint mobilization, Stair training, Aquatic Therapy, Electrical stimulation, Cryotherapy, Moist heat, Taping, Ultrasound, Ionotophoresis 4mg /ml Dexamethasone, and Manual therapy  PLAN FOR NEXT SESSION: Consider TUG, 5 STS test, 6 minute walk, screen balance; adjust goals review HEP   Lorayne Bender PT  01/04/2023

## 2023-01-04 ENCOUNTER — Encounter (HOSPITAL_BASED_OUTPATIENT_CLINIC_OR_DEPARTMENT_OTHER): Payer: Self-pay | Admitting: Physical Therapy

## 2023-01-06 ENCOUNTER — Encounter (HOSPITAL_BASED_OUTPATIENT_CLINIC_OR_DEPARTMENT_OTHER): Payer: Self-pay

## 2023-01-06 ENCOUNTER — Ambulatory Visit (HOSPITAL_BASED_OUTPATIENT_CLINIC_OR_DEPARTMENT_OTHER): Payer: Medicare HMO

## 2023-01-06 DIAGNOSIS — R262 Difficulty in walking, not elsewhere classified: Secondary | ICD-10-CM

## 2023-01-06 DIAGNOSIS — R2681 Unsteadiness on feet: Secondary | ICD-10-CM

## 2023-01-06 DIAGNOSIS — R42 Dizziness and giddiness: Secondary | ICD-10-CM

## 2023-01-06 NOTE — Therapy (Signed)
OUTPATIENT PHYSICAL THERAPY LOWER EXTREMITY Treatment    Patient Name: Jason [name] MRN: [id number] DOB:Dec 30, 1935, 87 y.o., male Today's Date: 01/06/2023  END OF SESSION:  PT End of Session - 01/06/23 1404     Visit Number 7    Number of Visits 16    Date for PT Re-Evaluation 01/27/23    PT Start Time 1348    PT Stop Time 1423    PT Time Calculation (min) 35 min    Activity Tolerance Patient tolerated treatment well    Behavior During Therapy [facility] for tasks assessed/performed               Past Medical History:  Diagnosis Date   Ascending aortic aneurysm (HCC) 01/22/2022   CAD in native artery 01/22/2022   Cancer (HCC)    Cataract    Diabetes mellitus without complication (HCC)    DM (diabetes mellitus) (HCC)    Gait instability 01/22/2022   Hypertension    Sleep apnea    History reviewed. No pertinent surgical history. Patient Active Problem List   Diagnosis Date Noted   Obstructive sleep apnea of adult 08/11/2022   Diabetic nephropathy with proteinuria (HCC) 08/11/2022   Obesity 08/11/2022   Hyperlipidemia 08/11/2022   Chronic kidney disease, stage 3a (HCC) 08/11/2022   Anxiety disorder, unspecified 08/11/2022   Diabetes mellitus (HCC) 08/11/2022   Primary malignant neoplasm of prostate (HCC) 08/02/2022   Carcinoma of prostate (HCC) 08/02/2022   Shortness of breath 02/22/2022   Pure hypercholesterolemia 02/22/2022   Pain in joint, shoulder region 02/22/2022   Other malaise and fatigue 02/22/2022   Nonexudative age-related macular degeneration, bilateral, intermediate dry stage 02/22/2022   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   Gastroesophageal reflux disease 02/22/2022   Benign paroxysmal positional vertigo 02/22/2022   CAD in native artery 01/22/2022   Thoracic aortic aneurysm, without rupture, unspecified (HCC) 01/22/2022   Gait instability 01/22/2022   Essential hypertension 01/17/2022   Type 2 diabetes mellitus without complications (HCC) 01/17/2022   HLD  (hyperlipidemia) 01/17/2022   Obstructive sleep apnea 01/17/2022   Sensorineural hearing loss, bilateral 11/09/2021   Orthostatic hypotension 11/09/2021   Imbalance 11/09/2021   Overweight 02/21/2020   First degree heart block 02/21/2020   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   Malignant tumor of prostate (HCC) 02/21/2020   Vitamin D deficiency 05/31/2016   History of malignant neoplasm of prostate 05/31/2016   History of malignant neoplasm of skin 05/31/2016   Bilateral hearing loss 05/31/2016   Anxiety state 05/31/2016   Bronchospasm 09/24/2010   Cough 09/19/2010   Acute bronchitis 09/19/2010   Allergic rhinitis due to pollen 09/19/2010   Acute sinusitis 09/19/2010    PCP: Meredith Staggers, MD  REFERRING PROVIDER: Meredith Staggers, MD  REFERRING DIAG: R26.81 (ICD-10-CM) - Unsteadiness   THERAPY DIAG:  Unsteadiness on feet  Dizziness and giddiness  Difficulty in walking, not elsewhere classified  Rationale for Evaluation and Treatment: Rehabilitation  ONSET DATE: march 2024  SUBJECTIVE:   SUBJECTIVE STATEMENT: Pt reports benefit from PT. "My daughter says I look more stable." Pt reports he is tired today.States he didn't get a full night's sleep. Has had mild light headedness since this morning. Reports he has been drinking water and ate cereal and yogurt.  PERTINENT HISTORY: Type 2 diabetes, sleep apnea, thoracic aortic aneurysm, orthorstatic hypotension  PAIN:  Are you having pain? No  PRECAUTIONS: None  WEIGHT BEARING RESTRICTIONS: No  FALLS:  Has patient fallen in last 6 months? Yes. Number of falls 2, step  done on a small step. Another one reaching down and fell  LIVING ENVIRONMENT: Lives with: lives with their family Lives in: House/apartment Stairs: No Has following equipment at home: Single point cane  OCCUPATION: retired   PLOF: Independent  PATIENT GOALS: walking without pain, being more stable  NEXT MD VISIT: nothing scheduled   OBJECTIVE:       DIAGNOSTIC FINDINGS: nothing pertinent   PATIENT SURVEYS:  FOTO    COGNITION: Overall cognitive status: Within functional limits for tasks assessed     SENSATION: WFL  EDEMA:   MUSCLE LENGTH:   POSTURE: rounded shoulders  PALPATION:   LOWER EXTREMITY ROM:  Active ROM Right eval Left eval  Hip flexion    Hip extension    Hip abduction    Hip adduction    Hip internal rotation    Hip external rotation    Knee flexion    Knee extension    Ankle dorsiflexion    Ankle plantarflexion    Ankle inversion    Ankle eversion     (Blank rows = not tested)  LOWER EXTREMITY MMT:  MMT Right eval Left eval  Hip flexion 19.5 19.3 painful  Hip extension    Hip abduction 22.5 27.1  Hip adduction    Hip internal rotation    Hip external rotation    Knee flexion    Knee extension 27.4 33.2  Ankle dorsiflexion    Ankle plantarflexion    Ankle inversion    Ankle eversion     (Blank rows = not tested)  LOWER EXTREMITY SPECIAL TESTS:    5/1: Vitals 139/34mmHg 70bpm  4/26: Vitals: 166/108 at beginning Waited and took again: 168/96 Standing: 133/852 After seated exercise: 181/112 After rest:165/108 At end of session: 167/104  EVAL: Vitals:  Patient Initial blood pressure seated was 165/118.  Patient blood pressure was retaken after a few minutes and was 171/103. Patient blood pressure was also taken standing, 134/78 FUNCTIONAL TESTS:  TUG test: 16sec- No AD 5xSTS: 27sec (used arms to push up)  GAIT: Patients uses a SPC to ambulate.  Balance:  Will assess next visit when orthostatics are more controlled   TODAY'S TREATMENT:                                                                                                                              DATE:   5/23  Baseline BP 160/93  NuStep 5 minutes level 3 BP after nu-step: 164/100 Rested and took again: 146/111 Waited and took again at 159/101  Standing march x20   Narrow base eyes  closed 2x20 seconds Modified tandem stance 2x30 seconds ea Standing marches x10 (monitor L hip pain) BP after standing exercises (taken in standing) 125/76   5/21  Baseline BP 128/82  NuStep 5 minutes level 3   Heel /toe x20  Standing march x20   Narrow base eyes open and eyes closed 2x30 sec hold At this point patient  reported increased syncope after transferring sit to stand.  He reports that he feels like is because of his he has not been able to take a second round of blood pressure medication.  He did feel like he could do more exercises so he did seated exercises instead  Seated hip abduction with red band 2 x20 Seated knee extension 3 x10ea Seated hip flexion with red band 2 x20  5/17  Baseline B/P 160/99   Narrow base eyes open and eyes closed 2x30 sec hold Tandem stance eyes open and eyes closed 2x30 sec hold   Coordinated stepping forward and lateral x15 each   Heel /toe x20  Standing march x20    Hurdle step over  148/99 post     PATIENT EDUCATION:  Education details: POC, Symptom management, HEP Person educated: Patient Education method: Explanation, Demonstration, Tactile cues, Verbal cues, and Handouts Education comprehension: verbalized understanding, returned demonstration, verbal cues required, tactile cues required, and needs further education  HOME EXERCISE PROGRAM: Access Code: [email or web address] URL: [email or web address] Date: 12/02/2022 Prepared by: Lorayne Bender  Exercises - Seated Hip Abduction with Resistance  - 1 x daily - 7 x weekly - 3 sets - 10 reps - Seated Knee Extension with Resistance  - 1 x daily - 7 x weekly - 3 sets - 10 reps - Seated Knee Lifts with Resistance  - 1 x daily - 7 x weekly - 3 sets - 10 reps  ASSESSMENT:  CLINICAL IMPRESSION: Patient continues to have high BP so monitored this closely throughout session. No increase in light headedness. Good tolerance for standing exercises, though he does c/o L hip  pain and plans to see an ortho MD regarding this in a couple of weeks. At end of session, BP had dropped to normal range when taken in standing position. Will continue to progress as tolerated while monitoring BP.  OBJECTIVE IMPAIRMENTS: decreased activity tolerance, decreased balance, decreased endurance, difficulty walking, decreased strength, and dizziness.   ACTIVITY LIMITATIONS: carrying, lifting, bending, standing, squatting, sleeping, and stairs  PARTICIPATION LIMITATIONS: meal prep, cleaning, laundry, driving, shopping, community activity, and yard work  PERSONAL FACTORS: 3+ comorbidities: type 2 diabetes, orthostatic hypotension, sleep apnea, thoracic aortic anuerysm  are also affecting patient's functional outcome.   REHAB POTENTIAL: Good  CLINICAL DECISION MAKING: Evolving/moderate complexity  EVALUATION COMPLEXITY: Moderate   GOALS: Goals reviewed with patient? Yes  SHORT TERM GOALS: Target date: 5/9 Patient will increase bilateral strength by 5lbs. Baseline: Goal status: INITIAL  2.  Patient will be independent with HEP. Baseline:  Goal status: INITIAL  3.  Patient will show minimal fluctuation and syncope with sit to stand transfer  Baseline:  Goal status: INITIAL   LONG TERM GOALS: Target date: 01/27/2023    Patient will transfer sit to stand without loss of balance or syncope  Baseline:  Goal status: INITIAL  2.  Patient will perform daily activity with no loss of balance  Baseline:  Goal status: INITIAL  3.  Patient will be able to walk community distances without any discomfort in left hip or feeling of lost balance .  Baseline:  Goal status: INITIAL      PLAN:  PT FREQUENCY: 1-2x/week  PT DURATION: 10 weeks  PLANNED INTERVENTIONS: Therapeutic exercises, Therapeutic activity, Neuromuscular re-education, Balance training, Gait training, Patient/Family education, Self Care, Joint mobilization, Stair training, Aquatic Therapy, Electrical  stimulation, Cryotherapy, Moist heat, Taping, Ultrasound, Ionotophoresis 4mg /ml Dexamethasone, and Manual therapy  PLAN FOR NEXT SESSION: Consider TUG, 5 STS test,  6 minute walk, screen balance; adjust goals review HEP   Riki Altes, PTA  01/06/2023

## 2023-01-11 DIAGNOSIS — Z7984 Long term (current) use of oral hypoglycemic drugs: Secondary | ICD-10-CM | POA: Diagnosis not present

## 2023-01-11 DIAGNOSIS — H5202 Hypermetropia, left eye: Secondary | ICD-10-CM | POA: Diagnosis not present

## 2023-01-11 DIAGNOSIS — H524 Presbyopia: Secondary | ICD-10-CM | POA: Diagnosis not present

## 2023-01-11 DIAGNOSIS — E119 Type 2 diabetes mellitus without complications: Secondary | ICD-10-CM | POA: Diagnosis not present

## 2023-01-11 LAB — HM DIABETES EYE EXAM

## 2023-01-17 ENCOUNTER — Other Ambulatory Visit: Payer: Self-pay

## 2023-01-18 ENCOUNTER — Telehealth: Payer: Self-pay | Admitting: Family Medicine

## 2023-01-18 ENCOUNTER — Ambulatory Visit (HOSPITAL_BASED_OUTPATIENT_CLINIC_OR_DEPARTMENT_OTHER): Payer: Medicare HMO | Attending: Family Medicine

## 2023-01-18 ENCOUNTER — Encounter (HOSPITAL_BASED_OUTPATIENT_CLINIC_OR_DEPARTMENT_OTHER): Payer: Self-pay

## 2023-01-18 DIAGNOSIS — R42 Dizziness and giddiness: Secondary | ICD-10-CM | POA: Insufficient documentation

## 2023-01-18 DIAGNOSIS — R262 Difficulty in walking, not elsewhere classified: Secondary | ICD-10-CM | POA: Diagnosis not present

## 2023-01-18 DIAGNOSIS — R2681 Unsteadiness on feet: Secondary | ICD-10-CM | POA: Insufficient documentation

## 2023-01-18 NOTE — Telephone Encounter (Signed)
I left the pt a vm to call our office back because his lancets did not come with a pen

## 2023-01-18 NOTE — Telephone Encounter (Signed)
Encourage patient to contact the pharmacy for refills or they can request refills through [facility]  (Please schedule appointment if patient has not been seen in over a year)    WHAT PHARMACY WOULD THEY LIKE THIS SENT TO:  [id number] [place], [place] - [address]   MEDICATION NAME & DOSE: Lancet holder broke [facility] but needs a prescription to get it.   NOTES/COMMENTS FROM PATIENT:      Front office please notify patient: It takes 48-72 hours to process rx refill requests Ask patient to call pharmacy to ensure rx is ready before heading there.

## 2023-01-18 NOTE — Therapy (Signed)
OUTPATIENT PHYSICAL THERAPY LOWER EXTREMITY Treatment    Patient Name: Jason [name] MRN: [id number] DOB:05-16-1936, 87 y.o., male Today's Date: 01/18/2023  END OF SESSION:  PT End of Session - 01/18/23 0935     Visit Number 8    Number of Visits 16    Date for PT Re-Evaluation 01/27/23    PT Start Time 0935    PT Stop Time 1011    PT Time Calculation (min) 36 min    Equipment Utilized During Treatment Gait belt    Activity Tolerance Patient tolerated treatment well    Behavior During Therapy [facility] for tasks assessed/performed                Past Medical History:  Diagnosis Date   Ascending aortic aneurysm (HCC) 01/22/2022   CAD in native artery 01/22/2022   Cancer (HCC)    Cataract    Diabetes mellitus without complication (HCC)    DM (diabetes mellitus) (HCC)    Gait instability 01/22/2022   Hypertension    Sleep apnea    History reviewed. No pertinent surgical history. Patient Active Problem List   Diagnosis Date Noted   Obstructive sleep apnea of adult 08/11/2022   Diabetic nephropathy with proteinuria (HCC) 08/11/2022   Obesity 08/11/2022   Hyperlipidemia 08/11/2022   Chronic kidney disease, stage 3a (HCC) 08/11/2022   Anxiety disorder, unspecified 08/11/2022   Diabetes mellitus (HCC) 08/11/2022   Primary malignant neoplasm of prostate (HCC) 08/02/2022   Carcinoma of prostate (HCC) 08/02/2022   Shortness of breath 02/22/2022   Pure hypercholesterolemia 02/22/2022   Pain in joint, shoulder region 02/22/2022   Other malaise and fatigue 02/22/2022   Nonexudative age-related macular degeneration, bilateral, intermediate dry stage 02/22/2022   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   Gastroesophageal reflux disease 02/22/2022   Benign paroxysmal positional vertigo 02/22/2022   CAD in native artery 01/22/2022   Thoracic aortic aneurysm, without rupture, unspecified (HCC) 01/22/2022   Gait instability 01/22/2022   Essential hypertension 01/17/2022   Type 2 diabetes  mellitus without complications (HCC) 01/17/2022   HLD (hyperlipidemia) 01/17/2022   Obstructive sleep apnea 01/17/2022   Sensorineural hearing loss, bilateral 11/09/2021   Orthostatic hypotension 11/09/2021   Imbalance 11/09/2021   Overweight 02/21/2020   First degree heart block 02/21/2020   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   Malignant tumor of prostate (HCC) 02/21/2020   Vitamin D deficiency 05/31/2016   History of malignant neoplasm of prostate 05/31/2016   History of malignant neoplasm of skin 05/31/2016   Bilateral hearing loss 05/31/2016   Anxiety state 05/31/2016   Bronchospasm 09/24/2010   Cough 09/19/2010   Acute bronchitis 09/19/2010   Allergic rhinitis due to pollen 09/19/2010   Acute sinusitis 09/19/2010    PCP: Meredith Staggers, MD  REFERRING PROVIDER: Meredith Staggers, MD  REFERRING DIAG: R26.81 (ICD-10-CM) - Unsteadiness   THERAPY DIAG:  Dizziness and giddiness  Difficulty in walking, not elsewhere classified  Unsteadiness on feet  Rationale for Evaluation and Treatment: Rehabilitation  ONSET DATE: march 2024  SUBJECTIVE:   SUBJECTIVE STATEMENT: Pt reports no symptoms of light headedness of dizziness at entry. L hip pain varies day to day. Sees MD about this next week.  PERTINENT HISTORY: Type 2 diabetes, sleep apnea, thoracic aortic aneurysm, orthorstatic hypotension  PAIN:  Are you having pain? No  PRECAUTIONS: None  WEIGHT BEARING RESTRICTIONS: No  FALLS:  Has patient fallen in last 6 months? Yes. Number of falls 2, step done on a small step. Another one reaching down and  fell  LIVING ENVIRONMENT: Lives with: lives with their family Lives in: House/apartment Stairs: No Has following equipment at home: Single point cane  OCCUPATION: retired   PLOF: Independent  PATIENT GOALS: walking without pain, being more stable  NEXT MD VISIT: nothing scheduled   OBJECTIVE:      DIAGNOSTIC FINDINGS: nothing pertinent   PATIENT SURVEYS:  FOTO     COGNITION: Overall cognitive status: Within functional limits for tasks assessed     SENSATION: WFL  EDEMA:   MUSCLE LENGTH:   POSTURE: rounded shoulders  PALPATION:   LOWER EXTREMITY ROM:  Active ROM Right eval Left eval  Hip flexion    Hip extension    Hip abduction    Hip adduction    Hip internal rotation    Hip external rotation    Knee flexion    Knee extension    Ankle dorsiflexion    Ankle plantarflexion    Ankle inversion    Ankle eversion     (Blank rows = not tested)  LOWER EXTREMITY MMT:  MMT Right eval Left eval  Hip flexion 19.5 19.3 painful  Hip extension    Hip abduction 22.5 27.1  Hip adduction    Hip internal rotation    Hip external rotation    Knee flexion    Knee extension 27.4 33.2  Ankle dorsiflexion    Ankle plantarflexion    Ankle inversion    Ankle eversion     (Blank rows = not tested)  LOWER EXTREMITY SPECIAL TESTS:    5/1: Vitals 139/37mmHg 70bpm  4/26: Vitals: 166/108 at beginning Waited and took again: 168/96 Standing: 133/852 After seated exercise: 181/112 After rest:165/108 At end of session: 167/104  EVAL: Vitals:  Patient Initial blood pressure seated was 165/118.  Patient blood pressure was retaken after a few minutes and was 171/103. Patient blood pressure was also taken standing, 134/78 FUNCTIONAL TESTS:  TUG test: 16sec- No AD 5xSTS: 27sec (used arms to push up)  GAIT: Patients uses a SPC to ambulate.  Balance:  Will assess next visit when orthostatics are more controlled   TODAY'S TREATMENT:                                                                                                                              DATE:   6/4  Baseline BP: seated: 169/93 78bpm  BP in standing: 121/73 80bpm  Nu step 5 minutes L3  Standing heel raise x20  Standing marches 2x10 (monitor L hip pain) BP 152/90 HR:89bpm (taken in standing)  Narrow base eyes closed 3x30 seconds Modified tandem  stance 2x30 seconds ea BP (seated)179/113 78bpm Waited a few mintues then took again 165/101 74bpm  5/23  Baseline BP 160/93  NuStep 5 minutes level 3 BP after nu-step: 164/100 Rested and took again: 146/111 Waited and took again at 159/101  Standing march x20   Narrow base eyes closed 2x20 seconds Modified tandem stance 2x30 seconds  ea Standing marches x10 (monitor L hip pain) BP after standing exercises (taken in standing) 125/76  PATIENT EDUCATION:  Education details: POC, Symptom management, HEP Person educated: Patient Education method: Explanation, Demonstration, Tactile cues, Verbal cues, and Handouts Education comprehension: verbalized understanding, returned demonstration, verbal cues required, tactile cues required, and needs further education  HOME EXERCISE PROGRAM: Access Code: [email or web address] URL: [email or web address] Date: 12/02/2022 Prepared by: Lorayne Bender  Exercises - Seated Hip Abduction with Resistance  - 1 x daily - 7 x weekly - 3 sets - 10 reps - Seated Knee Extension with Resistance  - 1 x daily - 7 x weekly - 3 sets - 10 reps - Seated Knee Lifts with Resistance  - 1 x daily - 7 x weekly - 3 sets - 10 reps  ASSESSMENT:  CLINICAL IMPRESSION: Pt continues to demonstrate high blood pressure at rest in standing position. He denied feelings of syncope and dizziness. Did report mild "shakiness" after balance activities. Waited until BP decreased before leaving clinic. See above for BP values. Pt does report benefit from PT and overall improvement in his stability and balance.  OBJECTIVE IMPAIRMENTS: decreased activity tolerance, decreased balance, decreased endurance, difficulty walking, decreased strength, and dizziness.   ACTIVITY LIMITATIONS: carrying, lifting, bending, standing, squatting, sleeping, and stairs  PARTICIPATION LIMITATIONS: meal prep, cleaning, laundry, driving, shopping, community activity, and yard work  PERSONAL FACTORS:  3+ comorbidities: type 2 diabetes, orthostatic hypotension, sleep apnea, thoracic aortic anuerysm  are also affecting patient's functional outcome.   REHAB POTENTIAL: Good  CLINICAL DECISION MAKING: Evolving/moderate complexity  EVALUATION COMPLEXITY: Moderate   GOALS: Goals reviewed with patient? Yes  SHORT TERM GOALS: Target date: 5/9 Patient will increase bilateral strength by 5lbs. Baseline: Goal status: INITIAL  2.  Patient will be independent with HEP. Baseline:  Goal status: INITIAL  3.  Patient will show minimal fluctuation and syncope with sit to stand transfer  Baseline:  Goal status: MET (01/18/23)   LONG TERM GOALS: Target date: 01/27/2023    Patient will transfer sit to stand without loss of balance or syncope  Baseline:  Goal status: MET 6/4  2.  Patient will perform daily activity with no loss of balance  Baseline:  Goal status: IN PROGRESS  3.  Patient will be able to walk community distances without any discomfort in left hip or feeling of lost balance .  Baseline:  Goal status: INITIAL      PLAN:  PT FREQUENCY: 1-2x/week  PT DURATION: 10 weeks  PLANNED INTERVENTIONS: Therapeutic exercises, Therapeutic activity, Neuromuscular re-education, Balance training, Gait training, Patient/Family education, Self Care, Joint mobilization, Stair training, Aquatic Therapy, Electrical stimulation, Cryotherapy, Moist heat, Taping, Ultrasound, Ionotophoresis 4mg /ml Dexamethasone, and Manual therapy  PLAN FOR NEXT SESSION: Consider TUG, 5 STS test, 6 minute walk, screen balance; adjust goals review HEP   Riki Altes, PTA  01/18/2023

## 2023-01-20 ENCOUNTER — Encounter (HOSPITAL_BASED_OUTPATIENT_CLINIC_OR_DEPARTMENT_OTHER): Payer: Self-pay

## 2023-01-20 ENCOUNTER — Ambulatory Visit (HOSPITAL_BASED_OUTPATIENT_CLINIC_OR_DEPARTMENT_OTHER): Payer: Medicare HMO

## 2023-01-20 DIAGNOSIS — R42 Dizziness and giddiness: Secondary | ICD-10-CM

## 2023-01-20 DIAGNOSIS — R262 Difficulty in walking, not elsewhere classified: Secondary | ICD-10-CM

## 2023-01-20 DIAGNOSIS — R2681 Unsteadiness on feet: Secondary | ICD-10-CM | POA: Diagnosis not present

## 2023-01-20 NOTE — Therapy (Signed)
OUTPATIENT PHYSICAL THERAPY LOWER EXTREMITY Treatment    Patient Name: Jason [name] MRN: [id number] DOB:[birth year], 87 y.o., male Today's Date: 01/20/2023  END OF SESSION:  PT End of Session - 01/20/23 0936     Visit Number 9    Number of Visits 16    Date for PT Re-Evaluation 01/27/23    PT Start Time 0935    PT Stop Time 1014    PT Time Calculation (min) 39 min    Equipment Utilized During Treatment Gait belt    Activity Tolerance Patient tolerated treatment well    Behavior During Therapy [facility] for tasks assessed/performed                 Past Medical History:  Diagnosis Date   Ascending aortic aneurysm (HCC) 01/22/2022   CAD in native artery 01/22/2022   Cancer (HCC)    Cataract    Diabetes mellitus without complication (HCC)    DM (diabetes mellitus) (HCC)    Gait instability 01/22/2022   Hypertension    Sleep apnea    History reviewed. No pertinent surgical history. Patient Active Problem List   Diagnosis Date Noted   Obstructive sleep apnea of adult 08/11/2022   Diabetic nephropathy with proteinuria (HCC) 08/11/2022   Obesity 08/11/2022   Hyperlipidemia 08/11/2022   Chronic kidney disease, stage 3a (HCC) 08/11/2022   Anxiety disorder, unspecified 08/11/2022   Diabetes mellitus (HCC) 08/11/2022   Primary malignant neoplasm of prostate (HCC) 08/02/2022   Carcinoma of prostate (HCC) 08/02/2022   Shortness of breath 02/22/2022   Pure hypercholesterolemia 02/22/2022   Pain in joint, shoulder region 02/22/2022   Other malaise and fatigue 02/22/2022   Nonexudative age-related macular degeneration, bilateral, intermediate dry stage 02/22/2022   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   Gastroesophageal reflux disease 02/22/2022   Benign paroxysmal positional vertigo 02/22/2022   CAD in native artery 01/22/2022   Thoracic aortic aneurysm, without rupture, unspecified (HCC) 01/22/2022   Gait instability 01/22/2022   Essential hypertension 01/17/2022   Type 2 diabetes  mellitus without complications (HCC) 01/17/2022   HLD (hyperlipidemia) 01/17/2022   Obstructive sleep apnea 01/17/2022   Sensorineural hearing loss, bilateral 11/09/2021   Orthostatic hypotension 11/09/2021   Imbalance 11/09/2021   Overweight 02/21/2020   First degree heart block 02/21/2020   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   Malignant tumor of prostate (HCC) 02/21/2020   Vitamin D deficiency 05/31/2016   History of malignant neoplasm of prostate 05/31/2016   History of malignant neoplasm of skin 05/31/2016   Bilateral hearing loss 05/31/2016   Anxiety state 05/31/2016   Bronchospasm 09/24/2010   Cough 09/19/2010   Acute bronchitis 09/19/2010   Allergic rhinitis due to pollen 09/19/2010   Acute sinusitis 09/19/2010    PCP: Meredith Staggers, MD  REFERRING PROVIDER: Meredith Staggers, MD  REFERRING DIAG: R26.81 (ICD-10-CM) - Unsteadiness   THERAPY DIAG:  Dizziness and giddiness  Difficulty in walking, not elsewhere classified  Unsteadiness on feet  Rationale for Evaluation and Treatment: Rehabilitation  ONSET DATE: march 2024  SUBJECTIVE:   SUBJECTIVE STATEMENT: Pt reports increased shakiness today.  PERTINENT HISTORY: Type 2 diabetes, sleep apnea, thoracic aortic aneurysm, orthorstatic hypotension  PAIN:  Are you having pain? No  PRECAUTIONS: None  WEIGHT BEARING RESTRICTIONS: No  FALLS:  Has patient fallen in last 6 months? Yes. Number of falls 2, step done on a small step. Another one reaching down and fell  LIVING ENVIRONMENT: Lives with: lives with their family Lives in: House/apartment Stairs: No Has following equipment  at home: Single point cane  OCCUPATION: retired   PLOF: Independent  PATIENT GOALS: walking without pain, being more stable  NEXT MD VISIT: nothing scheduled   OBJECTIVE:      DIAGNOSTIC FINDINGS: nothing pertinent   PATIENT SURVEYS:  FOTO    COGNITION: Overall cognitive status: Within functional limits for tasks  assessed     SENSATION: WFL  EDEMA:   MUSCLE LENGTH:   POSTURE: rounded shoulders  PALPATION:   LOWER EXTREMITY ROM:  Active ROM Right eval Left eval  Hip flexion    Hip extension    Hip abduction    Hip adduction    Hip internal rotation    Hip external rotation    Knee flexion    Knee extension    Ankle dorsiflexion    Ankle plantarflexion    Ankle inversion    Ankle eversion     (Blank rows = not tested)  LOWER EXTREMITY MMT:  MMT Right eval Left eval  Hip flexion 19.5 19.3 painful  Hip extension    Hip abduction 22.5 27.1  Hip adduction    Hip internal rotation    Hip external rotation    Knee flexion    Knee extension 27.4 33.2  Ankle dorsiflexion    Ankle plantarflexion    Ankle inversion    Ankle eversion     (Blank rows = not tested)  LOWER EXTREMITY SPECIAL TESTS:    5/1: Vitals 139/16mmHg 70bpm  4/26: Vitals: 166/108 at beginning Waited and took again: 168/96 Standing: 133/852 After seated exercise: 181/112 After rest:165/108 At end of session: 167/104  EVAL: Vitals:  Patient Initial blood pressure seated was 165/118.  Patient blood pressure was retaken after a few minutes and was 171/103. Patient blood pressure was also taken standing, 134/78 FUNCTIONAL TESTS:  TUG test: 16sec- No AD 5xSTS: 27sec (used arms to push up)  GAIT: Patients uses a SPC to ambulate.  Balance:  Will assess next visit when orthostatics are more controlled   TODAY'S TREATMENT:                                                                                                                              DATE:    6/6  Baseline BP: seated: 156/101 70bpm  BP in standing:  109/72 72bpm  Nu step 5 minutes L3 BP seated: 169/97 71bpm  Standing heel raise x20  Standing marches 2x10 (monitor L hip pain) BP 148/81 HR:75 (seated)  Narrow base eyes closed 3x30 seconds (light CGA) Modified tandem stance 2x30 seconds ea BP:165/95  (seated) 67bpm  Side stepping at rail x2 laps Gait with head turns xnods- full hallway x1ea. CGA and minimal SPC use.   End of session BP: 169/95 (seated) 74bpm BP:  122/79   (standing) 80bpm   6/4  Baseline BP: seated: 169/93 78bpm  BP in standing: 121/73 80bpm  Nu step 5 minutes L3  Standing heel raise x20  Standing marches  2x10 (monitor L hip pain) BP 152/90 HR:89bpm (taken in standing)  Narrow base eyes closed 3x30 seconds Modified tandem stance 2x30 seconds ea BP (seated)179/113 78bpm Waited a few mintues then took again 165/101 74bpm  5/23  Baseline BP 160/93  NuStep 5 minutes level 3 BP after nu-step: 164/100 Rested and took again: 146/111 Waited and took again at 159/101  Standing march x20   Narrow base eyes closed 2x20 seconds Modified tandem stance 2x30 seconds ea Standing marches x10 (monitor L hip pain) BP after standing exercises (taken in standing) 125/76  PATIENT EDUCATION:  Education details: POC, Symptom management, HEP Person educated: Patient Education method: Explanation, Demonstration, Tactile cues, Verbal cues, and Handouts Education comprehension: verbalized understanding, returned demonstration, verbal cues required, tactile cues required, and needs further education  HOME EXERCISE PROGRAM: Access Code: [email or web address] URL: [email or web address] Date: 12/02/2022 Prepared by: Lorayne Bender  Exercises - Seated Hip Abduction with Resistance  - 1 x daily - 7 x weekly - 3 sets - 10 reps - Seated Knee Extension with Resistance  - 1 x daily - 7 x weekly - 3 sets - 10 reps - Seated Knee Lifts with Resistance  - 1 x daily - 7 x weekly - 3 sets - 10 reps  ASSESSMENT:  CLINICAL IMPRESSION: Pt continues to experience significant change in BP values from sitting to standing. Able to tolerate gentle exercise with seated rest breaks throughout. Progressed gait activities to include walking with head turns to challenge balance  and steadiness and side stepping. Pt did well with these and denied dizziness/light headedness. Gait belt utilized with all standing tasks as a precaution with intermittent CGA as needed. OBJECTIVE IMPAIRMENTS: decreased activity tolerance, decreased balance, decreased endurance, difficulty walking, decreased strength, and dizziness.   ACTIVITY LIMITATIONS: carrying, lifting, bending, standing, squatting, sleeping, and stairs  PARTICIPATION LIMITATIONS: meal prep, cleaning, laundry, driving, shopping, community activity, and yard work  PERSONAL FACTORS: 3+ comorbidities: type 2 diabetes, orthostatic hypotension, sleep apnea, thoracic aortic anuerysm  are also affecting patient's functional outcome.   REHAB POTENTIAL: Good  CLINICAL DECISION MAKING: Evolving/moderate complexity  EVALUATION COMPLEXITY: Moderate   GOALS: Goals reviewed with patient? Yes  SHORT TERM GOALS: Target date: 5/9 Patient will increase bilateral strength by 5lbs. Baseline: Goal status: INITIAL  2.  Patient will be independent with HEP. Baseline:  Goal status: INITIAL  3.  Patient will show minimal fluctuation and syncope with sit to stand transfer  Baseline:  Goal status: MET (01/18/23)   LONG TERM GOALS: Target date: 01/27/2023    Patient will transfer sit to stand without loss of balance or syncope  Baseline:  Goal status: MET 6/4  2.  Patient will perform daily activity with no loss of balance  Baseline:  Goal status: IN PROGRESS  3.  Patient will be able to walk community distances without any discomfort in left hip or feeling of lost balance .  Baseline:  Goal status: INITIAL      PLAN:  PT FREQUENCY: 1-2x/week  PT DURATION: 10 weeks  PLANNED INTERVENTIONS: Therapeutic exercises, Therapeutic activity, Neuromuscular re-education, Balance training, Gait training, Patient/Family education, Self Care, Joint mobilization, Stair training, Aquatic Therapy, Electrical stimulation,  Cryotherapy, Moist heat, Taping, Ultrasound, Ionotophoresis 4mg /ml Dexamethasone, and Manual therapy  PLAN FOR NEXT SESSION: Consider TUG, 5 STS test, 6 minute walk, screen balance; adjust goals review HEP   Riki Altes, PTA  01/20/2023

## 2023-01-25 ENCOUNTER — Ambulatory Visit (HOSPITAL_BASED_OUTPATIENT_CLINIC_OR_DEPARTMENT_OTHER): Payer: Medicare HMO | Admitting: Physical Therapy

## 2023-01-25 ENCOUNTER — Encounter (HOSPITAL_BASED_OUTPATIENT_CLINIC_OR_DEPARTMENT_OTHER): Payer: Self-pay | Admitting: Physical Therapy

## 2023-01-25 DIAGNOSIS — R262 Difficulty in walking, not elsewhere classified: Secondary | ICD-10-CM | POA: Diagnosis not present

## 2023-01-25 DIAGNOSIS — R42 Dizziness and giddiness: Secondary | ICD-10-CM

## 2023-01-25 DIAGNOSIS — R2681 Unsteadiness on feet: Secondary | ICD-10-CM | POA: Diagnosis not present

## 2023-01-25 NOTE — Therapy (Signed)
OUTPATIENT PHYSICAL THERAPY LOWER EXTREMITY Treatment    Patient Name: Jason [name] MRN: [id number] DOB:[birth year], 87 y.o., male Today's Date: 01/25/2023  END OF SESSION:     Progress Note Reporting Period 4/18 to 6/11  See note below for Objective Data and Assessment of Progress/Goals.       Past Medical History:  Diagnosis Date   Ascending aortic aneurysm (HCC) 01/22/2022   CAD in native artery 01/22/2022   Cancer [place])    Cataract    Diabetes mellitus without complication (HCC)    DM (diabetes mellitus) (HCC)    Gait instability 01/22/2022   Hypertension    Sleep apnea    History reviewed. No pertinent surgical history. Patient Active Problem List   Diagnosis Date Noted   Obstructive sleep apnea of adult 08/11/2022   Diabetic nephropathy with proteinuria (HCC) 08/11/2022   Obesity 08/11/2022   Hyperlipidemia 08/11/2022   Chronic kidney disease, stage 3a (HCC) 08/11/2022   Anxiety disorder, unspecified 08/11/2022   Diabetes mellitus (HCC) 08/11/2022   Primary malignant neoplasm of prostate (HCC) 08/02/2022   Carcinoma of prostate (HCC) 08/02/2022   Shortness of breath 02/22/2022   Pure hypercholesterolemia 02/22/2022   Pain in joint, shoulder region 02/22/2022   Other malaise and fatigue 02/22/2022   Nonexudative age-related macular degeneration, bilateral, intermediate dry stage 02/22/2022   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   Gastroesophageal reflux disease 02/22/2022   Benign paroxysmal positional vertigo 02/22/2022   CAD in native artery 01/22/2022   Thoracic aortic aneurysm, without rupture, unspecified (HCC) 01/22/2022   Gait instability 01/22/2022   Essential hypertension 01/17/2022   Type 2 diabetes mellitus without complications (HCC) 01/17/2022   HLD (hyperlipidemia) 01/17/2022   Obstructive sleep apnea 01/17/2022   Sensorineural hearing loss, bilateral 11/09/2021   Orthostatic hypotension 11/09/2021   Imbalance 11/09/2021   Overweight 02/21/2020    First degree heart block 02/21/2020   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   Malignant tumor of prostate (HCC) 02/21/2020   Vitamin D deficiency 05/31/2016   History of malignant neoplasm of prostate 05/31/2016   History of malignant neoplasm of skin 05/31/2016   Bilateral hearing loss 05/31/2016   Anxiety state 05/31/2016   Bronchospasm 09/24/2010   Cough 09/19/2010   Acute bronchitis 09/19/2010   Allergic rhinitis due to pollen 09/19/2010   Acute sinusitis 09/19/2010    PCP: Meredith Staggers, MD  REFERRING PROVIDER: Meredith Staggers, MD  REFERRING DIAG: R26.81 (ICD-10-CM) - Unsteadiness   THERAPY DIAG:  No diagnosis found.  Rationale for Evaluation and Treatment: Rehabilitation  ONSET DATE: march 2024  SUBJECTIVE:   SUBJECTIVE STATEMENT: Pt reports overall he feels like he is moving better. He has been able to exercise more. He feels shakey PERTINENT HISTORY: Type 2 diabetes, sleep apnea, thoracic aortic aneurysm, orthorstatic hypotension  PAIN:  Are you having pain? No  PRECAUTIONS: None  WEIGHT BEARING RESTRICTIONS: No  FALLS:  Has patient fallen in last 6 months? Yes. Number of falls 2, step done on a small step. Another one reaching down and fell  LIVING ENVIRONMENT: Lives with: lives with their family Lives in: House/apartment Stairs: No Has following equipment at home: Single point cane  OCCUPATION: retired   PLOF: Independent  PATIENT GOALS: walking without pain, being more stable  NEXT MD VISIT: nothing scheduled   OBJECTIVE:      DIAGNOSTIC FINDINGS: nothing pertinent   PATIENT SURVEYS:  FOTO    COGNITION: Overall cognitive status: Within functional limits for tasks assessed     SENSATION: WFL  EDEMA:  MUSCLE LENGTH:   POSTURE: rounded shoulders  PALPATION:   LOWER EXTREMITY ROM:  Active ROM Right eval Left eval  Hip flexion    Hip extension    Hip abduction    Hip adduction    Hip internal rotation    Hip external  rotation    Knee flexion    Knee extension    Ankle dorsiflexion    Ankle plantarflexion    Ankle inversion    Ankle eversion     (Blank rows = not tested)  LOWER EXTREMITY MMT:  MMT Right eval Left eval Right 6/11  Left  6/11  Hip flexion 19.5 19.3 painful 25.0 23.9  Hip extension      Hip abduction 22.5 27.1 42.1 41.2  Hip adduction      Hip internal rotation      Hip external rotation      Knee flexion      Knee extension 27.4 33.2 45.1 42.5  Ankle dorsiflexion      Ankle plantarflexion      Ankle inversion      Ankle eversion       (Blank rows = not tested)  LOWER EXTREMITY SPECIAL TESTS:    5/1: Vitals 139/68mmHg 70bpm  4/26: Vitals: 166/108 at beginning Waited and took again: 168/96 Standing: 133/852 After seated exercise: 181/112 After rest:165/108 At end of session: 167/104  EVAL: Vitals:  Patient Initial blood pressure seated was 165/118.  Patient blood pressure was retaken after a few minutes and was 171/103. Patient blood pressure was also taken standing, 134/78 FUNCTIONAL TESTS:  Eval:TUG test: 16sec- No AD 5xSTS: 27sec (used arms to push up)  6/11  5XSTS 19  TUG 17 without AD   6 min walk test: 2.5 minutes until for seated rest break seated rest break of 2 minutes.  After rest break able to ambulate until 5:30 mark at which time he needed a seated rest break for the rest of the test.  Total distance 527 feet.   GAIT: Patients uses a SPC to ambulate.  Balance:   6/11 Narrow base of support standby assist Narrow base of support eyes closed min assist Tandem stance min assist bilateral Single-leg stance unable to maintain   TODAY'S TREATMENT:                                                                                                                              DATE:       6/12  Baseline:170/100 baseline Standing: 134/80  mild syncope noted  Seated Hip abduction 20 green  Seated March x20 Green  Seated LAQ  x20 Green    Reviewed and performed testing with patient.      6/6  Baseline BP: seated: 156/101 70bpm  BP in standing:  109/72 72bpm  Nu step 5 minutes L3 BP seated: 169/97 71bpm  Standing heel raise x20  Standing marches 2x10 (monitor L hip pain) BP 148/81 HR:75 (seated)  Narrow base eyes closed 3x30 seconds (light CGA) Modified tandem stance 2x30 seconds ea BP:165/95 (seated) 67bpm  Side stepping at rail x2 laps Gait with head turns xnods- full hallway x1ea. CGA and minimal SPC use.   End of session BP: 169/95 (seated) 74bpm BP:  122/79   (standing) 80bpm   6/4  Baseline BP: seated: 169/93 78bpm  BP in standing: 121/73 80bpm  Nu step 5 minutes L3  Standing heel raise x20  Standing marches 2x10 (monitor L hip pain) BP 152/90 HR:89bpm (taken in standing)  Narrow base eyes closed 3x30 seconds Modified tandem stance 2x30 seconds ea BP (seated)179/113 78bpm Waited a few mintues then took again 165/101 74bpm  5/23  Baseline BP 160/93  NuStep 5 minutes level 3 BP after nu-step: 164/100 Rested and took again: 146/111 Waited and took again at 159/101  Standing march x20   Narrow base eyes closed 2x20 seconds Modified tandem stance 2x30 seconds ea Standing marches x10 (monitor L hip pain) BP after standing exercises (taken in standing) 125/76  PATIENT EDUCATION:  Education details: POC, Symptom management, HEP Person educated: Patient Education method: Explanation, Demonstration, Tactile cues, Verbal cues, and Handouts Education comprehension: verbalized understanding, returned demonstration, verbal cues required, tactile cues required, and needs further education  HOME EXERCISE PROGRAM: Access Code: [email or web address] URL: [email or web address] Date: 12/02/2022 Prepared by: Lorayne Bender  Exercises - Seated Hip Abduction with Resistance  - 1 x daily - 7 x weekly - 3 sets - 10 reps - Seated Knee Extension with Resistance  - 1 x daily - 7 x  weekly - 3 sets - 10 reps - Seated Knee Lifts with Resistance  - 1 x daily - 7 x weekly - 3 sets - 10 reps  ASSESSMENT:  CLINICAL IMPRESSION: Patient continues to have high baseline blood pressure.  The patient reports he is on medication to keep his blood pressure high.  He has significant orthostatic hypotension when he stands.  This is not changed from the initial eval.  The severity of his symptoms has improved.  He still feels syncope but less syncope.  Overall he feels like he is getting around his house better.  He is having less episodes of what he calls shakiness.  He has been able to establish a home exercise program.  Therapy was able to advance his resistance for leg exercises today.  Improved on all functional tests except for TUG testing.  We are able to establish a baseline with 6-minute walk test.  On initial eval he was not able to complete a 6-minute walk test safely.  Despite blood pressure issues we have been able to treat the patient consistently this episode.  He would benefit from further skilled therapy 2W8.  See below for goal specific progress  OBJECTIVE IMPAIRMENTS: decreased activity tolerance, decreased balance, decreased endurance, difficulty walking, decreased strength, and dizziness.   ACTIVITY LIMITATIONS: carrying, lifting, bending, standing, squatting, sleeping, and stairs  PARTICIPATION LIMITATIONS: meal prep, cleaning, laundry, driving, shopping, community activity, and yard work  PERSONAL FACTORS: 3+ comorbidities: type 2 diabetes, orthostatic hypotension, sleep apnea, thoracic aortic anuerysm  are also affecting patient's functional outcome.   REHAB POTENTIAL: Good  CLINICAL DECISION MAKING: Evolving/moderate complexity  EVALUATION COMPLEXITY: Moderate   GOALS: Goals reviewed with patient? Yes  SHORT TERM GOALS: Target date: 5/9 Patient will increase bilateral strength by 5lbs. Baseline: Goal status: Significant improvement in strength overall  initial goal which she achieved 6/12 new goal 5 pounds from measurements on 612  2.  Patient will be independent with HEP. Baseline:  Goal status: Performing base HEP at home 6/12  3.  Patient will show minimal fluctuation and syncope with sit to stand transfer  Baseline:  Goal status: Syncope has improved.  Has not major fluctuations in blood pressure 6/12  LONG TERM GOALS: Target date: 01/27/2023    Patient will transfer sit to stand without loss of balance or syncope  Baseline:  Goal status: MET 6/4  2.  Patient will perform daily activity with no loss of balance  Baseline:  Goal status: IN PROGRESS 6/12 improving but still some baseline balance issues  3.  Patient will be able to walk community distances without any discomfort in left hip or feeling of lost balance .  Baseline:  Goal status: INITIAL      PLAN:  PT FREQUENCY: 1-2x/week  PT DURATION: 10 weeks  PLANNED INTERVENTIONS: Therapeutic exercises, Therapeutic activity, Neuromuscular re-education, Balance training, Gait training, Patient/Family education, Self Care, Joint mobilization, Stair training, Aquatic Therapy, Electrical stimulation, Cryotherapy, Moist heat, Taping, Ultrasound, Ionotophoresis 4mg /ml Dexamethasone, and Manual therapy  PLAN FOR NEXT SESSION: Consider TUG, 5 STS test, 6 minute walk, screen balance; adjust goals review HEP   Lorayne Bender, PT, DPT  01/25/2023

## 2023-01-26 ENCOUNTER — Ambulatory Visit (INDEPENDENT_AMBULATORY_CARE_PROVIDER_SITE_OTHER): Payer: Medicare HMO | Admitting: Family Medicine

## 2023-01-26 ENCOUNTER — Encounter (HOSPITAL_BASED_OUTPATIENT_CLINIC_OR_DEPARTMENT_OTHER): Payer: Self-pay | Admitting: Physical Therapy

## 2023-01-26 ENCOUNTER — Encounter: Payer: Self-pay | Admitting: Family Medicine

## 2023-01-26 VITALS — BP 128/76 | HR 73 | Temp 97.6°F | Ht 73.0 in | Wt 275.8 lb

## 2023-01-26 DIAGNOSIS — R059 Cough, unspecified: Secondary | ICD-10-CM | POA: Diagnosis not present

## 2023-01-26 DIAGNOSIS — E785 Hyperlipidemia, unspecified: Secondary | ICD-10-CM

## 2023-01-26 DIAGNOSIS — E669 Obesity, unspecified: Secondary | ICD-10-CM | POA: Diagnosis not present

## 2023-01-26 DIAGNOSIS — R0981 Nasal congestion: Secondary | ICD-10-CM | POA: Diagnosis not present

## 2023-01-26 DIAGNOSIS — M109 Gout, unspecified: Secondary | ICD-10-CM | POA: Diagnosis not present

## 2023-01-26 DIAGNOSIS — Z794 Long term (current) use of insulin: Secondary | ICD-10-CM | POA: Diagnosis not present

## 2023-01-26 DIAGNOSIS — K219 Gastro-esophageal reflux disease without esophagitis: Secondary | ICD-10-CM

## 2023-01-26 DIAGNOSIS — F411 Generalized anxiety disorder: Secondary | ICD-10-CM

## 2023-01-26 DIAGNOSIS — E1169 Type 2 diabetes mellitus with other specified complication: Secondary | ICD-10-CM

## 2023-01-26 LAB — HEMOGLOBIN A1C: Hgb A1c MFr Bld: 7.2 % — ABNORMAL HIGH (ref 4.6–6.5)

## 2023-01-26 LAB — COMPREHENSIVE METABOLIC PANEL
ALT: 18 U/L (ref 0–53)
AST: 25 U/L (ref 0–37)
Albumin: 4.1 g/dL (ref 3.5–5.2)
Alkaline Phosphatase: 73 U/L (ref 39–117)
BUN: 19 mg/dL (ref 6–23)
CO2: 26 mEq/L (ref 19–32)
Calcium: 9.3 mg/dL (ref 8.4–10.5)
Chloride: 102 mEq/L (ref 96–112)
Creatinine, Ser: 1.28 mg/dL (ref 0.40–1.50)
GFR: 51.19 mL/min — ABNORMAL LOW (ref >60.00)
Glucose, Bld: 194 mg/dL — ABNORMAL HIGH (ref 70–99)
Potassium: 4.5 mEq/L (ref 3.5–5.1)
Sodium: 136 mEq/L (ref 135–145)
Total Bilirubin: 0.3 mg/dL (ref 0.2–1.2)
Total Protein: 7.5 g/dL (ref 6.0–8.3)

## 2023-01-26 MED ORDER — VENLAFAXINE HCL 75 MG PO TABS: 75.0000 mg | ORAL_TABLET | Freq: Every day | ORAL | 1 refills | Status: DC

## 2023-01-26 MED ORDER — FLUTICASONE PROPIONATE 50 MCG/ACT NA SUSP
1.0000 | Freq: Every day | NASAL | 6 refills | Status: DC
Start: 2023-01-26 — End: 2024-06-12

## 2023-01-26 MED ORDER — LANCET DEVICE MISC
0 refills | Status: DC
Start: 2023-01-26 — End: 2023-07-01

## 2023-01-26 MED ORDER — GLIMEPIRIDE 1 MG PO TABS: 1.0000 mg | ORAL_TABLET | Freq: Every day | ORAL | 1 refills | Status: DC

## 2023-01-26 MED ORDER — PIOGLITAZONE HCL 15 MG PO TABS: 15.0000 mg | ORAL_TABLET | Freq: Every day | ORAL | 1 refills | Status: DC

## 2023-01-26 MED ORDER — ALLOPURINOL 300 MG PO TABS: 300.0000 mg | ORAL_TABLET | Freq: Every day | ORAL | 1 refills | Status: DC

## 2023-01-26 MED ORDER — OMEPRAZOLE 20 MG PO CPDR: 20.0000 mg | DELAYED_RELEASE_CAPSULE | Freq: Every day | ORAL | 1 refills | Status: DC

## 2023-01-26 MED ORDER — ATORVASTATIN CALCIUM 40 MG PO TABS: 40.0000 mg | ORAL_TABLET | Freq: Every day | ORAL | 1 refills | Status: DC

## 2023-01-26 NOTE — Progress Notes (Signed)
Subjective:  Patient ID: Jason [name], male    DOB: 06-Oct-1935  Age: 87 y.o. MRN: [id number]  CC:  Chief Complaint  Patient presents with   Medical Management of Chronic Issues    Pt needs new lancet device, notes his has recently broken, no other concerns     HPI [name] presents for   Diabetes: With prior history of hypoglycemia, microalbuminuria.  Continued on metformin, Actos.  Prior sulfonylurea discontinued due to hypoglycemia, but then with increasing blood sugars restarted at 1 mg glimepiride daily.  Home readings around 140. Needs new device to hold lancet.  No symptomatic lows. Highest 170.  Microalbumin: 21.8 on 10/25/2022 Optho, foot exam, pneumovax: Ophthalmology exam 2 weeks ago.  No new med side effects.   Lab Results  Component Value Date   HGBA1C 6.9 (A) 10/25/2022   HGBA1C 7.5 (A) 07/14/2022   HGBA1C 7.3 (H) 03/18/2022   Lab Results  Component Value Date   MICROALBUR 21.8 (H) 10/25/2022   LDLCALC 82 10/25/2022   CREATININE 1.33 10/25/2022   Some phlegm in am after initial cough or sneeze. Minimal during day.  Some runny nose, congestion at times.   Orthostatic hypotension Followed by cardiology.  Has been started on midodrine.  2 times daily at his March visit.  History of unsteadiness with deconditioning, referred to physical therapy in March.  Handicap placard.felt better after PT and stability is better, strength better. Still on same meds including losartan, metoprolol.   Hyperlipidemia: Lipitor 40 mg daily, followed by cardiology as above. No new med side effects. Appt with cardiology tomorrow - EP, Dr. Graciela Husbands.  Lab Results  Component Value Date   CHOL 156 10/25/2022   HDL 47.90 10/25/2022   LDLCALC 82 10/25/2022   TRIG 131.0 10/25/2022   CHOLHDL 3 10/25/2022   Lab Results  Component Value Date   ALT 22 10/25/2022   AST 26 10/25/2022   ALKPHOS 70 10/25/2022   BILITOT 0.4 10/25/2022   History of anxiety Treated with Effexor 75 mg daily.  Working well.      01/26/2023    8:41 AM 10/25/2022    8:53 AM  GAD 7 : Generalized Anxiety Score  Nervous, Anxious, on Edge 0 0  Control/stop worrying 0 0  Worry too much - different things 0 0  Trouble relaxing 0 0  Restless 0 0  Easily annoyed or irritable 0 0  Afraid - awful might happen 0 0  Total GAD 7 Score 0 0       Gout: Last flare:years ago.  Daily meds: Allopurinol 300 mg daily Prn med: Colchicine in past, no recent meds needed.   History Patient Active Problem List   Diagnosis Date Noted   Obstructive sleep apnea of adult 08/11/2022   Diabetic nephropathy with proteinuria (HCC) 08/11/2022   Obesity 08/11/2022   Hyperlipidemia 08/11/2022   Chronic kidney disease, stage 3a (HCC) 08/11/2022   Anxiety disorder, unspecified 08/11/2022   Diabetes mellitus (HCC) 08/11/2022   Primary malignant neoplasm of prostate (HCC) 08/02/2022   Carcinoma of prostate (HCC) 08/02/2022   Shortness of breath 02/22/2022   Pure hypercholesterolemia 02/22/2022   Pain in joint, shoulder region 02/22/2022   Other malaise and fatigue 02/22/2022   Nonexudative age-related macular degeneration, bilateral, intermediate dry stage 02/22/2022   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   Gastroesophageal reflux disease 02/22/2022   Benign paroxysmal positional vertigo 02/22/2022   CAD in native artery 01/22/2022   Thoracic aortic aneurysm, without rupture, unspecified (HCC) 01/22/2022  Gait instability 01/22/2022   Essential hypertension 01/17/2022   Type 2 diabetes mellitus without complications (HCC) 01/17/2022   HLD (hyperlipidemia) 01/17/2022   Obstructive sleep apnea 01/17/2022   Sensorineural hearing loss, bilateral 11/09/2021   Orthostatic hypotension 11/09/2021   Imbalance 11/09/2021   Overweight 02/21/2020   First degree heart block 02/21/2020   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   Malignant tumor of prostate (HCC) 02/21/2020   Vitamin D deficiency 05/31/2016   History of malignant  neoplasm of prostate 05/31/2016   History of malignant neoplasm of skin 05/31/2016   Bilateral hearing loss 05/31/2016   Anxiety state 05/31/2016   Bronchospasm 09/24/2010   Cough 09/19/2010   Acute bronchitis 09/19/2010   Allergic rhinitis due to pollen 09/19/2010   Acute sinusitis 09/19/2010   Past Medical History:  Diagnosis Date   Ascending aortic aneurysm (HCC) 01/22/2022   CAD in native artery 01/22/2022   Cancer (HCC)    Cataract    Diabetes mellitus without complication (HCC)    DM (diabetes mellitus) (HCC)    Gait instability 01/22/2022   Hypertension    Sleep apnea    No past surgical history on file. Allergies  Allergen Reactions   Lisinopril Cough, Swelling and Other (See Comments)   Prior to Admission medications   Medication Sig Start Date End Date Taking? Authorizing Provider  allopurinol (ZYLOPRIM) 300 MG tablet TAKE 1 TABLET BY MOUTH EVERY DAY 06/24/22  Yes Shade Flood, MD  aspirin EC 81 MG tablet Take 1 tablet every day by oral route. 02/21/20  Yes [provider]  atorvastatin (LIPITOR) 40 MG tablet TAKE 1 TABLET BY MOUTH EVERY DAY 06/24/22  Yes Shade Flood, MD  b complex vitamins capsule Take 1 capsule by mouth daily.   Yes [provider]  glimepiride (AMARYL) 1 MG tablet TAKE 1 TABLET BY MOUTH DAILY WITH BREAKFAST. 10/11/22  Yes Shade Flood, MD  glucose blood test strip E11.9 Use to test blood sugar twice daily or as needed 08/12/22  Yes Pavero, Cristal Deer, RPH  losartan (COZAAR) 25 MG tablet TAKE 1 TABLET BY MOUTH IN THE MORNING AND AT BEDTIME 01/03/23  Yes Duke Salvia, MD  metFORMIN (GLUCOPHAGE) 500 MG tablet TAKE 2 TABLETS (1,000 MG TOTAL) BY MOUTH 2 (TWO) TIMES DAILY WITH A MEAL. 10/29/22  Yes Shade Flood, MD  metoprolol tartrate (LOPRESSOR) 25 MG tablet Take 12.5 mg by mouth 2 (two) times daily. 1/2 tablet 2 times daily 02/22/22  Yes Chilton Si, MD  midodrine (PROAMATINE) 10 MG tablet TAKE 1 TABLET BY MOUTH  BEFORE GETTING OUT OF BED OR SOON AFTER, 1 TABLET AT NOON, AND 1 TABLET AROUND 4PM 11/08/22  Yes Monge, Petra Kuba, NP  Multiple Vitamins-Minerals (ICAPS AREDS 2 PO) TAKE 1 CAPSULE BY MOUTH TWICE A DAY FOR MACULAR DEGENERATION 08/05/22  Yes [provider]  omeprazole (PRILOSEC) 20 MG capsule TAKE 1 CAPSULE BY MOUTH EVERY DAY 11/01/22  Yes Shade Flood, MD  OneTouch Delica Lancets 33G MISC E11.9 Use to test blood sugar twice daily or as needed 08/12/22  Yes Pavero, Cristal Deer, RPH  pioglitazone (ACTOS) 15 MG tablet TAKE 1 TABLET (15 MG TOTAL) BY MOUTH DAILY. 06/24/22  Yes Shade Flood, MD  venlafaxine [facility]) 75 MG tablet TAKE 1 TABLET BY MOUTH EVERY DAY 09/21/22  Yes Shade Flood, MD   Social History   Socioeconomic History   Marital status: Married    Spouse name: Not on file   Number of children:  Not on file   Years of education: Not on file   Highest education level: Doctorate  Occupational History   Not on file  Tobacco Use   Smoking status: Former    Types: Pipe   Smokeless tobacco: Never  Substance and Sexual Activity   Alcohol use: Yes    Alcohol/week: 2.0 standard drinks of alcohol    Types: 2 Glasses of wine per week   Drug use: Never   Sexual activity: Never  Other Topics Concern   Not on file  Social History Narrative   Not on file   Social Determinants of Health   Financial Resource Strain: Medium Risk (01/25/2023)   Overall Financial Resource Strain (CARDIA)    Difficulty of Paying Living Expenses: Somewhat hard  Food Insecurity: Patient Declined (01/25/2023)   Hunger Vital Sign    Worried About Running Out of Food in the Last Year: Patient declined    Ran Out of Food in the Last Year: Patient declined  Transportation Needs: No Transportation Needs (01/25/2023)   PRAPARE - Administrator, Civil Service (Medical): No    Lack of Transportation (Non-Medical): No  Physical Activity: Inactive (01/25/2023)   Exercise Vital Sign    Days  of Exercise per Week: 0 days    Minutes of Exercise per Session: 0 min  Stress: No Stress Concern Present (01/25/2023)   [employer] of Occupational Health - Occupational Stress Questionnaire    Feeling of Stress : Only a little  Social Connections: Unknown (01/25/2023)   Social Connection and Isolation Panel [NHANES]    Frequency of Communication with Friends and Family: Once a week    Frequency of Social Gatherings with Friends and Family: Twice a week    Attends Religious Services: Patient declined    Database administrator or Organizations: No    Attends Engineer, structural: Never    Marital Status: Married  Recent Concern: Social Connections - Moderately Isolated (12/23/2022)   Social Connection and Isolation Panel [NHANES]    Frequency of Communication with Friends and Family: More than three times a week    Frequency of Social Gatherings with Friends and Family: Once a week    Attends Religious Services: Never    Database administrator or Organizations: No    Attends Banker Meetings: Never    Marital Status: Married  Catering manager Violence: Not At Risk (12/23/2022)   Humiliation, Afraid, Rape, and Kick questionnaire    Fear of Current or Ex-Partner: No    Emotionally Abused: No    Physically Abused: No    Sexually Abused: No    Review of Systems  Constitutional:  Negative for fatigue and unexpected weight change.  Eyes:  Negative for visual disturbance.  Respiratory:  Negative for cough, chest tightness and shortness of breath.   Cardiovascular:  Negative for chest pain, palpitations and leg swelling.  Gastrointestinal:  Negative for abdominal pain and blood in stool.  Neurological:  Negative for dizziness, light-headedness and headaches.     Objective:   Vitals:   01/26/23 0843  BP: 128/76  Pulse: 73  Temp: 97.6 F (36.4 C)  TempSrc: Temporal  SpO2: 98%  Weight: 275 lb 12.8 oz (125.1 kg)  Height: 6\' 1"  (1.854 m)     Physical  Exam Vitals reviewed.  Constitutional:      Appearance: He is well-developed.  HENT:     Head: Normocephalic and atraumatic.  Neck:     Vascular:  No carotid bruit or JVD.  Cardiovascular:     Rate and Rhythm: Normal rate and regular rhythm.     Heart sounds: Normal heart sounds. No murmur heard. Pulmonary:     Effort: Pulmonary effort is normal.     Breath sounds: Normal breath sounds. No rales.  Musculoskeletal:     Right lower leg: No edema.     Left lower leg: No edema.  Skin:    General: Skin is warm and dry.  Neurological:     Mental Status: He is alert and oriented to person, place, and time.  Psychiatric:        Mood and Affect: Mood normal.        Assessment & Plan:  Jason [name] is a 87 y.o. male . Cough, unspecified type - Plan: fluticasone (FLONASE) 50 MCG/ACT nasal spray Nasal congestion - Plan: fluticasone (FLONASE) 50 MCG/ACT nasal spray  -Cough primarily in the morning, congestion, nasal congestion.  Possible allergy component.  Avoid antihistamines given his previous orthostatic hypotension, unsteadiness.  Trial of Flonase, RTC precautions if persistent.  Gastroesophageal reflux disease, unspecified whether esophagitis present - Plan: omeprazole (PRILOSEC) 20 MG capsule  -Stable, continue Prilosec, symptoms above do not sound to be reflux related.  Gout, unspecified - Plan: allopurinol (ZYLOPRIM) 300 MG tablet  -Tolerating allopurinol, no recent gout flare, continue same  Hyperlipidemia, unspecified hyperlipidemia type - Plan: atorvastatin (LIPITOR) 40 MG tablet  -Tolerating statin, follow-up with cardiology as planned.  No med changes for now.  Type 2 diabetes mellitus with obesity (HCC) - Plan: glimepiride (AMARYL) 1 MG tablet, pioglitazone (ACTOS) 15 MG tablet, Lancet Device MISC, Comprehensive metabolic panel, Hemoglobin A1c  -Improved A1c last visit, denies recent hypoglycemia or new med side effects, check updated labs and adjust meds  accordingly.  Anxiety state - Plan: venlafaxine (EFFEXOR) 75 MG tablet  -Stable with Effexor, continue same.  Meds ordered this encounter  Medications   omeprazole (PRILOSEC) 20 MG capsule    Sig: Take 1 capsule (20 mg total) by mouth daily.    Dispense:  90 capsule    Refill:  1   allopurinol (ZYLOPRIM) 300 MG tablet    Sig: Take 1 tablet (300 mg total) by mouth daily.    Dispense:  90 tablet    Refill:  1   atorvastatin (LIPITOR) 40 MG tablet    Sig: Take 1 tablet (40 mg total) by mouth daily.    Dispense:  90 tablet    Refill:  1   glimepiride (AMARYL) 1 MG tablet    Sig: Take 1 tablet (1 mg total) by mouth daily with breakfast.    Dispense:  90 tablet    Refill:  1   venlafaxine (EFFEXOR) 75 MG tablet    Sig: Take 1 tablet (75 mg total) by mouth daily.    Dispense:  90 tablet    Refill:  1   pioglitazone (ACTOS) 15 MG tablet    Sig: Take 1 tablet (15 mg total) by mouth daily.    Dispense:  90 tablet    Refill:  1   Lancet Device MISC    Sig: Dispense new lancet device based on patient insurance and current one touch delica products    Dispense:  1 each    Refill:  0   fluticasone (FLONASE) 50 MCG/ACT nasal spray    Sig: Place 1-2 sprays into both nostrils daily.    Dispense:  16 g    Refill:  6  Patient Instructions  Try taking flonase for congestion and cough. Follow up if that does not improve.  Return to the clinic or go to the nearest emergency room if any of your symptoms worsen or new symptoms occur. No other med changes today. Take care!    Signed,   Meredith Staggers, MD [facility], [facility] 01/26/23 9:10 AM

## 2023-01-26 NOTE — Patient Instructions (Addendum)
Try taking flonase for congestion and cough. Follow up if that does not improve.  Return to the clinic or go to the nearest emergency room if any of your symptoms worsen or new symptoms occur. No other med changes today. Take care!  New concern with

## 2023-01-27 ENCOUNTER — Encounter (HOSPITAL_BASED_OUTPATIENT_CLINIC_OR_DEPARTMENT_OTHER): Payer: Self-pay | Admitting: Physical Therapy

## 2023-01-27 ENCOUNTER — Ambulatory Visit (HOSPITAL_BASED_OUTPATIENT_CLINIC_OR_DEPARTMENT_OTHER): Payer: Medicare HMO | Admitting: Physical Therapy

## 2023-01-27 ENCOUNTER — Encounter: Payer: Self-pay | Admitting: Internal Medicine

## 2023-01-27 ENCOUNTER — Ambulatory Visit: Payer: Medicare HMO | Attending: Internal Medicine | Admitting: Internal Medicine

## 2023-01-27 VITALS — BP 134/81 | HR 82 | Ht 72.0 in | Wt 272.0 lb

## 2023-01-27 DIAGNOSIS — R262 Difficulty in walking, not elsewhere classified: Secondary | ICD-10-CM | POA: Diagnosis not present

## 2023-01-27 DIAGNOSIS — R42 Dizziness and giddiness: Secondary | ICD-10-CM

## 2023-01-27 DIAGNOSIS — I453 Trifascicular block: Secondary | ICD-10-CM | POA: Diagnosis not present

## 2023-01-27 DIAGNOSIS — I10 Essential (primary) hypertension: Secondary | ICD-10-CM | POA: Diagnosis not present

## 2023-01-27 DIAGNOSIS — R2681 Unsteadiness on feet: Secondary | ICD-10-CM

## 2023-01-27 DIAGNOSIS — I951 Orthostatic hypotension: Secondary | ICD-10-CM

## 2023-01-27 MED ORDER — LOSARTAN POTASSIUM 25 MG PO TABS
25.0000 mg | ORAL_TABLET | Freq: Every day | ORAL | 2 refills | Status: DC
Start: 2023-01-27 — End: 2024-04-11

## 2023-01-27 NOTE — Addendum Note (Signed)
Addended by: Duke Salvia on: 01/27/2023 06:47 PM   Modules accepted: Level of Service

## 2023-01-27 NOTE — Progress Notes (Addendum)
Patient Care Team: Shade Flood, MD as PCP - General (Family Medicine) Chilton Si, MD as PCP - Cardiology (Cardiology)   HPI  Jason [name] is a 87 y.o. male seen in follow-up for profound orthostatic hypotension for which after initial consultation 3/24 we encouraged use of compression, continuing his midodrine and increasing his fluid intake.  He has not used compression.  He has used midodrine.  He has been doing PT and raise the head of his bed.  Feels a little bit less unsteady Records and Results Reviewed   Past Medical History:  Diagnosis Date   Ascending aortic aneurysm (HCC) 01/22/2022   CAD in native artery 01/22/2022   Cancer [facility])    Cataract    Diabetes mellitus without complication (HCC)    DM (diabetes mellitus) (HCC)    Gait instability 01/22/2022   Hypertension    Sleep apnea     History reviewed. No pertinent surgical history.  Current Meds  Medication Sig   allopurinol (ZYLOPRIM) 300 MG tablet Take 1 tablet (300 mg total) by mouth daily.   aspirin EC 81 MG tablet Take 1 tablet every day by oral route.   atorvastatin (LIPITOR) 40 MG tablet Take 1 tablet (40 mg total) by mouth daily.   b complex vitamins capsule Take 1 capsule by mouth daily.   fluticasone (FLONASE) 50 MCG/ACT nasal spray Place 1-2 sprays into both nostrils daily.   glimepiride (AMARYL) 1 MG tablet Take 1 tablet (1 mg total) by mouth daily with breakfast.   glucose blood test strip E11.9 Use to test blood sugar twice daily or as needed   Lancet Device MISC Dispense new lancet device based on patient insurance and current one touch delica products   losartan (COZAAR) 25 MG tablet TAKE 1 TABLET BY MOUTH IN THE MORNING AND AT BEDTIME   metFORMIN (GLUCOPHAGE) 500 MG tablet TAKE 2 TABLETS (1,000 MG TOTAL) BY MOUTH 2 (TWO) TIMES DAILY WITH A MEAL.   metoprolol tartrate (LOPRESSOR) 25 MG tablet Take 12.5 mg by mouth 2 (two) times daily. 1/2 tablet 2 times daily   midodrine  (PROAMATINE) 10 MG tablet TAKE 1 TABLET BY MOUTH BEFORE GETTING OUT OF BED OR SOON AFTER, 1 TABLET AT NOON, AND 1 TABLET AROUND 4PM   Multiple Vitamins-Minerals (ICAPS AREDS 2 PO) TAKE 1 CAPSULE BY MOUTH TWICE A DAY FOR MACULAR DEGENERATION   omeprazole (PRILOSEC) 20 MG capsule Take 1 capsule (20 mg total) by mouth daily.   OneTouch Delica Lancets 33G MISC E11.9 Use to test blood sugar twice daily or as needed   pioglitazone (ACTOS) 15 MG tablet Take 1 tablet (15 mg total) by mouth daily.   venlafaxine (EFFEXOR) 75 MG tablet Take 1 tablet (75 mg total) by mouth daily.    Allergies  Allergen Reactions   Lisinopril Cough, Swelling and Other (See Comments)      Review of Systems negative except from HPI and PMH  Physical Exam Ht 6' (1.829 m)   Wt 272 lb (123.4 kg)   SpO2 95%   BMI 36.89 kg/m  Well developed and well nourished in no acute distress HENT normal E scleral and icterus clear Neck Supple JVP flat; carotids brisk and full Clear to ausculation Regular rate and rhythm, no murmurs gallops or rub Soft with active bowel sounds No clubbing cyanosis   Edema Alert and oriented, grossly normal motor and sensory function Skin Warm and Dry  ECG    Estimated Creatinine  Clearance: 57.2 mL/min (by C-G formula based on SCr of 1.28 mg/dL).   Assessment and  Plan  Orthostatic hypotension   Possible systemic autonomic failure   Supine systolic hypertension   Trifascicular block   Diabetes   Morbid obesity   Possible inferior wall MI   Aortic aneurysm   We reviewed again the physiology of orthostatic hypotension in the context of his systolic hypertension and diabetes.  He is agreeable to trying compression, we will start with either the abdomen or the thighs.  Right now we will hold off on pyridostigmine but he may have pharmacological support  Will also decrease his losartan from twice daily-nightly.  The idea is that he is upright during the day, he needs systolic  blood pressure protection mostly when he is supine at night, but giving him greater systolic pressure during the day may help protect him against falls.  Lying down today it was 177, but sitting up at home the other day and sitting here was in the 130s or 40s still with a 30 mm drop to standing..  Current medicines are reviewed at length with the patient today .  The patient does not  have concerns regarding medicines.

## 2023-01-27 NOTE — Therapy (Signed)
OUTPATIENT PHYSICAL THERAPY LOWER EXTREMITY Treatment    Patient Name: Jason [name] MRN: [id number] DOB:Jan 27, 1936, 87 y.o., male Today's Date: 01/27/2023  END OF SESSION:  PT End of Session - 01/27/23 1322     Visit Number 11    Number of Visits 16    Date for PT Re-Evaluation 03/23/23    PT Start Time 0930    PT Stop Time 0950    PT Time Calculation (min) 20 min    Activity Tolerance Patient tolerated treatment well    Behavior During Therapy [facility] for tasks assessed/performed               Progress Note Reporting Period 4/18 to 6/11  See note below for Objective Data and Assessment of Progress/Goals.       Past Medical History:  Diagnosis Date   Ascending aortic aneurysm (HCC) 01/22/2022   CAD in native artery 01/22/2022   Cancer [facility])    Cataract    Diabetes mellitus without complication (HCC)    DM (diabetes mellitus) (HCC)    Gait instability 01/22/2022   Hypertension    Sleep apnea    History reviewed. No pertinent surgical history. Patient Active Problem List   Diagnosis Date Noted   Obstructive sleep apnea of adult 08/11/2022   Diabetic nephropathy with proteinuria (HCC) 08/11/2022   Obesity 08/11/2022   Hyperlipidemia 08/11/2022   Chronic kidney disease, stage 3a (HCC) 08/11/2022   Anxiety disorder, unspecified 08/11/2022   Diabetes mellitus (HCC) 08/11/2022   Primary malignant neoplasm of prostate (HCC) 08/02/2022   Carcinoma of prostate (HCC) 08/02/2022   Shortness of breath 02/22/2022   Pure hypercholesterolemia 02/22/2022   Pain in joint, shoulder region 02/22/2022   Other malaise and fatigue 02/22/2022   Nonexudative age-related macular degeneration, bilateral, intermediate dry stage 02/22/2022   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   Gastroesophageal reflux disease 02/22/2022   Benign paroxysmal positional vertigo 02/22/2022   CAD in native artery 01/22/2022   Thoracic aortic aneurysm, without rupture, unspecified (HCC) 01/22/2022   Gait  instability 01/22/2022   Essential hypertension 01/17/2022   Type 2 diabetes mellitus without complications (HCC) 01/17/2022   HLD (hyperlipidemia) 01/17/2022   Obstructive sleep apnea 01/17/2022   Sensorineural hearing loss, bilateral 11/09/2021   Orthostatic hypotension 11/09/2021   Imbalance 11/09/2021   Overweight 02/21/2020   First degree heart block 02/21/2020   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   Malignant tumor of prostate (HCC) 02/21/2020   Vitamin D deficiency 05/31/2016   History of malignant neoplasm of prostate 05/31/2016   History of malignant neoplasm of skin 05/31/2016   Bilateral hearing loss 05/31/2016   Anxiety state 05/31/2016   Bronchospasm 09/24/2010   Cough 09/19/2010   Acute bronchitis 09/19/2010   Allergic rhinitis due to pollen 09/19/2010   Acute sinusitis 09/19/2010    PCP: Meredith Staggers, MD  REFERRING PROVIDER: Meredith Staggers, MD  REFERRING DIAG: R26.81 (ICD-10-CM) - Unsteadiness   THERAPY DIAG:  Dizziness and giddiness  Difficulty in walking, not elsewhere classified  Unsteadiness on feet  Rationale for Evaluation and Treatment: Rehabilitation  ONSET DATE: march 2024  SUBJECTIVE:   SUBJECTIVE STATEMENT: The patient is feeling bad this morning. He wasn't able to sleep last night.    PERTINENT HISTORY: Type 2 diabetes, sleep apnea, thoracic aortic aneurysm, orthorstatic hypotension  PAIN:  Are you having pain? No  PRECAUTIONS: None  WEIGHT BEARING RESTRICTIONS: No  FALLS:  Has patient fallen in last 6 months? Yes. Number of falls 2, step done on a small  step. Another one reaching down and fell  LIVING ENVIRONMENT: Lives with: lives with their family Lives in: House/apartment Stairs: No Has following equipment at home: Single point cane  OCCUPATION: retired   PLOF: Independent  PATIENT GOALS: walking without pain, being more stable  NEXT MD VISIT: nothing scheduled   OBJECTIVE:      DIAGNOSTIC FINDINGS: nothing  pertinent   PATIENT SURVEYS:  FOTO    COGNITION: Overall cognitive status: Within functional limits for tasks assessed     SENSATION: WFL  EDEMA:   MUSCLE LENGTH:   POSTURE: rounded shoulders  PALPATION:   LOWER EXTREMITY ROM:  Active ROM Right eval Left eval  Hip flexion    Hip extension    Hip abduction    Hip adduction    Hip internal rotation    Hip external rotation    Knee flexion    Knee extension    Ankle dorsiflexion    Ankle plantarflexion    Ankle inversion    Ankle eversion     (Blank rows = not tested)  LOWER EXTREMITY MMT:  MMT Right eval Left eval Right 6/11  Left  6/11  Hip flexion 19.5 19.3 painful 25.0 23.9  Hip extension      Hip abduction 22.5 27.1 42.1 41.2  Hip adduction      Hip internal rotation      Hip external rotation      Knee flexion      Knee extension 27.4 33.2 45.1 42.5  Ankle dorsiflexion      Ankle plantarflexion      Ankle inversion      Ankle eversion       (Blank rows = not tested)  LOWER EXTREMITY SPECIAL TESTS:    5/1: Vitals 139/44mmHg 70bpm  4/26: Vitals: 166/108 at beginning Waited and took again: 168/96 Standing: 133/852 After seated exercise: 181/112 After rest:165/108 At end of session: 167/104  EVAL: Vitals:  Patient Initial blood pressure seated was 165/118.  Patient blood pressure was retaken after a few minutes and was 171/103. Patient blood pressure was also taken standing, 134/78 FUNCTIONAL TESTS:  Eval:TUG test: 16sec- No AD 5xSTS: 27sec (used arms to push up)  6/11  5XSTS 19  TUG 17 without AD   6 min walk test: 2.5 minutes until for seated rest break seated rest break of 2 minutes.  After rest break able to ambulate until 5:30 mark at which time he needed a seated rest break for the rest of the test.  Total distance 527 feet.   GAIT: Patients uses a SPC to ambulate.  Balance:   6/11 Narrow base of support standby assist Narrow base of support eyes closed min  assist Tandem stance min assist bilateral Single-leg stance unable to maintain   TODAY'S TREATMENT:  DATE:  6/13  B/P 179/106   Biceps curl 3x15 2lb  Punches  2x15 2lbs   Assessed B/P and it had increased to 189/113  Session halted         6/12  Baseline:170/100 baseline Standing: 134/80  mild syncope noted  Seated Hip abduction 20 green  Seated March x20 Green  Seated LAQ  x20 Green   Reviewed and performed testing with patient.      6/6  Baseline BP: seated: 156/101 70bpm  BP in standing:  109/72 72bpm  Nu step 5 minutes L3 BP seated: 169/97 71bpm  Standing heel raise x20  Standing marches 2x10 (monitor L hip pain) BP 148/81 HR:75 (seated)  Narrow base eyes closed 3x30 seconds (light CGA) Modified tandem stance 2x30 seconds ea BP:165/95 (seated) 67bpm  Side stepping at rail x2 laps Gait with head turns xnods- full hallway x1ea. CGA and minimal SPC use.   End of session BP: 169/95 (seated) 74bpm BP:  122/79   (standing) 80bpm   6/4  Baseline BP: seated: 169/93 78bpm  BP in standing: 121/73 80bpm  Nu step 5 minutes L3  Standing heel raise x20  Standing marches 2x10 (monitor L hip pain) BP 152/90 HR:89bpm (taken in standing)  Narrow base eyes closed 3x30 seconds Modified tandem stance 2x30 seconds ea BP (seated)179/113 78bpm Waited a few mintues then took again 165/101 74bpm  5/23  Baseline BP 160/93  NuStep 5 minutes level 3 BP after nu-step: 164/100 Rested and took again: 146/111 Waited and took again at 159/101  Standing march x20   Narrow base eyes closed 2x20 seconds Modified tandem stance 2x30 seconds ea Standing marches x10 (monitor L hip pain) BP after standing exercises (taken in standing) 125/76  PATIENT EDUCATION:  Education details: POC, Symptom management, HEP Person educated:  Patient Education method: Explanation, Demonstration, Tactile cues, Verbal cues, and Handouts Education comprehension: verbalized understanding, returned demonstration, verbal cues required, tactile cues required, and needs further education  HOME EXERCISE PROGRAM: Access Code: [email or web address] URL: [email or web address] Date: 12/02/2022 Prepared by: Lorayne Bender  Exercises - Seated Hip Abduction with Resistance  - 1 x daily - 7 x weekly - 3 sets - 10 reps - Seated Knee Extension with Resistance  - 1 x daily - 7 x weekly - 3 sets - 10 reps - Seated Knee Lifts with Resistance  - 1 x daily - 7 x weekly - 3 sets - 10 reps  ASSESSMENT:  CLINICAL IMPRESSION: In past sessions the patient's blood pressure has decreased with exercise.  The patient came in with slightly higher blood pressure than his usual baseline today and was feeling more fatigued and symptomatic.  We trialed an exercise or 2 with the hope of his blood pressure dropping.  His blood pressure did not drop and the session was halted.  He will be seeing his MD today.  We took his blood pressure once once he stood up and it dropped significantly to 150/85.  Therapy will proceed per cardiologist recommendations.  OBJECTIVE IMPAIRMENTS: decreased activity tolerance, decreased balance, decreased endurance, difficulty walking, decreased strength, and dizziness.   ACTIVITY LIMITATIONS: carrying, lifting, bending, standing, squatting, sleeping, and stairs  PARTICIPATION LIMITATIONS: meal prep, cleaning, laundry, driving, shopping, community activity, and yard work  PERSONAL FACTORS: 3+ comorbidities: type 2 diabetes, orthostatic hypotension, sleep apnea, thoracic aortic anuerysm  are also affecting patient's functional outcome.   REHAB POTENTIAL: Good  CLINICAL DECISION MAKING: Evolving/moderate complexity  EVALUATION COMPLEXITY: Moderate   GOALS: Goals reviewed with  patient? Yes  SHORT TERM GOALS: Target date:  5/9 Patient will increase bilateral strength by 5lbs. Baseline: Goal status: Significant improvement in strength overall initial goal which she achieved 6/12 new goal 5 pounds from measurements on 612  2.  Patient will be independent with HEP. Baseline:  Goal status: Performing base HEP at home 6/12  3.  Patient will show minimal fluctuation and syncope with sit to stand transfer  Baseline:  Goal status: Syncope has improved.  Has not major fluctuations in blood pressure 6/12  LONG TERM GOALS: Target date: 01/27/2023    Patient will transfer sit to stand without loss of balance or syncope  Baseline:  Goal status: MET 6/4  2.  Patient will perform daily activity with no loss of balance  Baseline:  Goal status: IN PROGRESS 6/12 improving but still some baseline balance issues  3.  Patient will be able to walk community distances without any discomfort in left hip or feeling of lost balance .  Baseline:  Goal status: INITIAL      PLAN:  PT FREQUENCY: 1-2x/week  PT DURATION: 10 weeks  PLANNED INTERVENTIONS: Therapeutic exercises, Therapeutic activity, Neuromuscular re-education, Balance training, Gait training, Patient/Family education, Self Care, Joint mobilization, Stair training, Aquatic Therapy, Electrical stimulation, Cryotherapy, Moist heat, Taping, Ultrasound, Ionotophoresis 4mg /ml Dexamethasone, and Manual therapy  PLAN FOR NEXT SESSION: Consider TUG, 5 STS test, 6 minute walk, screen balance; adjust goals review HEP   Lorayne Bender, PT, DPT  01/27/2023

## 2023-01-27 NOTE — Patient Instructions (Signed)
Medication Instructions:  Your physician has recommended you make the following change in your medication:   **  Decrease Losartan 25mg  to 1 tablet by mouth every night  *If you need a refill on your cardiac medications before your next appointment, please call your pharmacy*   Lab Work: None ordered.  If you have labs (blood work) drawn today and your tests are completely normal, you will receive your results only by: MyChart Message (if you have MyChart) OR A paper copy in the mail If you have any lab test that is abnormal or we need to change your treatment, we will call you to review the results.   Testing/Procedures: None ordered.    Follow-Up: At [facility], you and your health needs are our priority.  As part of our continuing mission to provide you with exceptional heart care, we have created designated Provider Care Teams.  These Care Teams include your primary Cardiologist (physician) and Advanced Practice Providers (APPs -  Physician Assistants and Nurse Practitioners) who all work together to provide you with the care you need, when you need it.  We recommend signing up for the patient portal called "MyChart".  Sign up information is provided on this After Visit Summary.  MyChart is used to connect with patients for Virtual Visits (Telemedicine).  Patients are able to view lab/test results, encounter notes, upcoming appointments, etc.  Non-urgent messages can be sent to your provider as well.   To learn more about what you can do with MyChart, go to [email or web address].    Your next appointment:   6 months with Dr Graciela Husbands

## 2023-02-02 ENCOUNTER — Encounter (HOSPITAL_BASED_OUTPATIENT_CLINIC_OR_DEPARTMENT_OTHER): Payer: Self-pay

## 2023-02-02 ENCOUNTER — Ambulatory Visit (HOSPITAL_BASED_OUTPATIENT_CLINIC_OR_DEPARTMENT_OTHER): Payer: Medicare HMO

## 2023-02-02 DIAGNOSIS — R2681 Unsteadiness on feet: Secondary | ICD-10-CM

## 2023-02-02 DIAGNOSIS — R42 Dizziness and giddiness: Secondary | ICD-10-CM

## 2023-02-02 DIAGNOSIS — R262 Difficulty in walking, not elsewhere classified: Secondary | ICD-10-CM

## 2023-02-02 NOTE — Therapy (Signed)
OUTPATIENT PHYSICAL THERAPY LOWER EXTREMITY Treatment    Patient Name: Jason [name] MRN: [id number] DOB:[birth year], 87 y.o., male Today's Date: 02/02/2023  END OF SESSION:  PT End of Session - 02/02/23 1312     Visit Number 12    Number of Visits 16    Date for PT Re-Evaluation 03/23/23    PT Start Time 0932    PT Stop Time 1015    PT Time Calculation (min) 43 min    Equipment Utilized During Treatment Gait belt    Activity Tolerance Patient tolerated treatment well    Behavior During Therapy [facility] for tasks assessed/performed                Progress Note Reporting Period 4/18 to 6/11  See note below for Objective Data and Assessment of Progress/Goals.       Past Medical History:  Diagnosis Date   Ascending aortic aneurysm (HCC) 01/22/2022   CAD in native artery 01/22/2022   Cancer [facility])    Cataract    Diabetes mellitus without complication (HCC)    DM (diabetes mellitus) (HCC)    Gait instability 01/22/2022   Hypertension    Sleep apnea    History reviewed. No pertinent surgical history. 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   Obstructive sleep apnea of adult 08/11/2022   Diabetic nephropathy with proteinuria (HCC) 08/11/2022   Obesity 08/11/2022   Hyperlipidemia 08/11/2022   Chronic kidney disease, stage 3a (HCC) 08/11/2022   Anxiety disorder, unspecified 08/11/2022   Diabetes mellitus (HCC) 08/11/2022   Primary malignant neoplasm of prostate (HCC) 08/02/2022   Carcinoma of prostate (HCC) 08/02/2022   Shortness of breath 02/22/2022   Pure hypercholesterolemia 02/22/2022   Pain in joint, shoulder region 02/22/2022   Other malaise and fatigue 02/22/2022   Nonexudative age-related macular degeneration, bilateral, intermediate dry stage 02/22/2022   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   Gastroesophageal reflux disease 02/22/2022   Benign paroxysmal positional vertigo 02/22/2022   CAD in native artery 01/22/2022    Thoracic aortic aneurysm, without rupture, unspecified (HCC) 01/22/2022   Gait instability 01/22/2022   Essential hypertension 01/17/2022   Type 2 diabetes mellitus without complications (HCC) 01/17/2022   HLD (hyperlipidemia) 01/17/2022   Obstructive sleep apnea 01/17/2022   Sensorineural hearing loss, bilateral 11/09/2021   Orthostatic hypotension 11/09/2021   Imbalance 11/09/2021   Overweight 02/21/2020   First degree heart block 02/21/2020   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   Malignant tumor of prostate (HCC) 02/21/2020   Vitamin D deficiency 05/31/2016   History of malignant neoplasm of prostate 05/31/2016   History of malignant neoplasm of skin 05/31/2016   Bilateral hearing loss 05/31/2016   Anxiety state 05/31/2016   Bronchospasm 09/24/2010   Cough 09/19/2010   Acute bronchitis 09/19/2010   Allergic rhinitis due to pollen 09/19/2010   Acute sinusitis 09/19/2010    PCP: Meredith Staggers, MD  REFERRING PROVIDER: Meredith Staggers, MD  REFERRING DIAG: R26.81 (ICD-10-CM) - Unsteadiness   THERAPY DIAG:  Dizziness and giddiness  Unsteadiness on feet  Difficulty in walking, not elsewhere classified  Rationale for Evaluation and Treatment: Rehabilitation  ONSET DATE: march 2024  SUBJECTIVE:   SUBJECTIVE STATEMENT: Pt saw cardiologist who is pleased with his progress, per pt report. "He wants me get compression garments for my abodmen and my thighs, but I haven't ordered them yet. I'm not excited about that." "He said my blood pressure is what it is with the medicine I'm on.".Pt bought  a new mattress a week ago which is helping his sleep quality.    PERTINENT HISTORY: Type 2 diabetes, sleep apnea, thoracic aortic aneurysm, orthorstatic hypotension  PAIN:  Are you having pain? No  PRECAUTIONS: None  WEIGHT BEARING RESTRICTIONS: No  FALLS:  Has patient fallen in last 6 months? Yes. Number of falls 2, step done on a small step. Another one reaching down and  fell  LIVING ENVIRONMENT: Lives with: lives with their family Lives in: House/apartment Stairs: No Has following equipment at home: Single point cane  OCCUPATION: retired   PLOF: Independent  PATIENT GOALS: walking without pain, being more stable  NEXT MD VISIT: nothing scheduled   OBJECTIVE:      DIAGNOSTIC FINDINGS: nothing pertinent   PATIENT SURVEYS:  FOTO    COGNITION: Overall cognitive status: Within functional limits for tasks assessed     SENSATION: WFL  EDEMA:   MUSCLE LENGTH:   POSTURE: rounded shoulders  PALPATION:   LOWER EXTREMITY ROM:  Active ROM Right eval Left eval  Hip flexion    Hip extension    Hip abduction    Hip adduction    Hip internal rotation    Hip external rotation    Knee flexion    Knee extension    Ankle dorsiflexion    Ankle plantarflexion    Ankle inversion    Ankle eversion     (Blank rows = not tested)  LOWER EXTREMITY MMT:  MMT Right eval Left eval Right 6/11  Left  6/11  Hip flexion 19.5 19.3 painful 25.0 23.9  Hip extension      Hip abduction 22.5 27.1 42.1 41.2  Hip adduction      Hip internal rotation      Hip external rotation      Knee flexion      Knee extension 27.4 33.2 45.1 42.5  Ankle dorsiflexion      Ankle plantarflexion      Ankle inversion      Ankle eversion       (Blank rows = not tested)  LOWER EXTREMITY SPECIAL TESTS:    5/1: Vitals 139/26mmHg 70bpm  4/26: Vitals: 166/108 at beginning Waited and took again: 168/96 Standing: 133/852 After seated exercise: 181/112 After rest:165/108 At end of session: 167/104  EVAL: Vitals:  Patient Initial blood pressure seated was 165/118.  Patient blood pressure was retaken after a few minutes and was 171/103. Patient blood pressure was also taken standing, 134/78 FUNCTIONAL TESTS:  Eval:TUG test: 16sec- No AD 5xSTS: 27sec (used arms to push up)  6/11  5XSTS 19  TUG 17 without AD   6 min walk test: 2.5 minutes until for  seated rest break seated rest break of 2 minutes.  After rest break able to ambulate until 5:30 mark at which time he needed a seated rest break for the rest of the test.  Total distance 527 feet.   GAIT: Patients uses a SPC to ambulate.  Balance:   6/11 Narrow base of support standby assist Narrow base of support eyes closed min assist Tandem stance min assist bilateral Single-leg stance unable to maintain   TODAY'S TREATMENT:  DATE:   6/19: BP: 147/93 72bpm in seated BP: 117/74 82 bpm in standing  Nu step L 4 Standing heel raise x20  Standing marches 2x10 (monitor L hip pain) BP 167/96 HR:80 (seated)  Narrow base eyes closed 3x30 seconds (light CGA) Modified tandem stance 2x30 seconds ea BP:165/95 (seated) 67bpm   Gait with head turns xnods- full hallway x1ea. CGA and no SPC use. (SBA/CGA) 131/83 88bpm (standing) Side stepping at rail x1 lap  Biceps curl 2x20 2lb  Punches  x20 2lbs   End of session: 138/87 77 bpm  6/13  B/P 179/106   Biceps curl 3x15 2lb  Punches  2x15 2lbs      6/12  Baseline:170/100 baseline Standing: 134/80  mild syncope noted  Seated Hip abduction 20 green  Seated March x20 Green  Seated LAQ  x20 Green   Reviewed and performed testing with patient.      6/6  Baseline BP: seated: 156/101 70bpm  BP in standing:  109/72 72bpm  Nu step 5 minutes L3 BP seated: 169/97 71bpm  Standing heel raise x20  Standing marches 2x10 (monitor L hip pain) BP 148/81 HR:75 (seated)  Narrow base eyes closed 3x30 seconds (light CGA) Modified tandem stance 2x30 seconds ea BP:165/95 (seated) 67bpm  Side stepping at rail x2 laps Gait with head turns xnods- full hallway x1ea. CGA and minimal SPC use.   End of session BP: 169/95 (seated) 74bpm BP:  122/79   (standing) 80bpm  PATIENT EDUCATION:   Education details: POC, Symptom management, HEP Person educated: Patient Education method: Explanation, Demonstration, Tactile cues, Verbal cues, and Handouts Education comprehension: verbalized understanding, returned demonstration, verbal cues required, tactile cues required, and needs further education  HOME EXERCISE PROGRAM: Access Code: [email or web address] URL: [email or web address] Date: 12/02/2022 Prepared by: Lorayne Bender  Exercises - Seated Hip Abduction with Resistance  - 1 x daily - 7 x weekly - 3 sets - 10 reps - Seated Knee Extension with Resistance  - 1 x daily - 7 x weekly - 3 sets - 10 reps - Seated Knee Lifts with Resistance  - 1 x daily - 7 x weekly - 3 sets - 10 reps  ASSESSMENT:  CLINICAL IMPRESSION: Pt had improved tolerance for exercise today with BP remaining within typical ranges for pt. Monitored frequently throughout session. Able to complete dynamic balance tasks in hallway withut use of cane, though he does complain of L hip pain with prolonged WB, though this does not affect his balance.  Patient is progressing well with PT despite his fluctuating blood pressure.  Will continue to monitor this as we progress with strengthening and balance interventions.  OBJECTIVE IMPAIRMENTS: decreased activity tolerance, decreased balance, decreased endurance, difficulty walking, decreased strength, and dizziness.   ACTIVITY LIMITATIONS: carrying, lifting, bending, standing, squatting, sleeping, and stairs  PARTICIPATION LIMITATIONS: meal prep, cleaning, laundry, driving, shopping, community activity, and yard work  PERSONAL FACTORS: 3+ comorbidities: type 2 diabetes, orthostatic hypotension, sleep apnea, thoracic aortic anuerysm  are also affecting patient's functional outcome.   REHAB POTENTIAL: Good  CLINICAL DECISION MAKING: Evolving/moderate complexity  EVALUATION COMPLEXITY: Moderate   GOALS: Goals reviewed with patient? Yes  SHORT TERM GOALS: Target  date: 5/9 Patient will increase bilateral strength by 5lbs. Baseline: Goal status: Significant improvement in strength overall initial goal which she achieved 6/12 new goal 5 pounds from measurements on 612  2.  Patient will be independent with HEP. Baseline:  Goal status: Performing base HEP at home 6/12  3.  Patient will show minimal fluctuation and syncope with sit to stand transfer  Baseline:  Goal status: Syncope has improved.  Has not major fluctuations in blood pressure 6/12  LONG TERM GOALS: Target date: 01/27/2023    Patient will transfer sit to stand without loss of balance or syncope  Baseline:  Goal status: MET 6/4  2.  Patient will perform daily activity with no loss of balance  Baseline:  Goal status: IN PROGRESS 6/12 improving but still some baseline balance issues  3.  Patient will be able to walk community distances without any discomfort in left hip or feeling of lost balance .  Baseline:  Goal status: INITIAL      PLAN:  PT FREQUENCY: 1-2x/week  PT DURATION: 10 weeks  PLANNED INTERVENTIONS: Therapeutic exercises, Therapeutic activity, Neuromuscular re-education, Balance training, Gait training, Patient/Family education, Self Care, Joint mobilization, Stair training, Aquatic Therapy, Electrical stimulation, Cryotherapy, Moist heat, Taping, Ultrasound, Ionotophoresis 4mg /ml Dexamethasone, and Manual therapy  PLAN FOR NEXT SESSION: Consider TUG, 5 STS test, 6 minute walk, screen balance; adjust goals review HEP   Riki Altes, PTA   02/02/2023

## 2023-02-04 ENCOUNTER — Ambulatory Visit (HOSPITAL_BASED_OUTPATIENT_CLINIC_OR_DEPARTMENT_OTHER): Payer: Medicare HMO | Admitting: Physical Therapy

## 2023-02-04 ENCOUNTER — Encounter (HOSPITAL_BASED_OUTPATIENT_CLINIC_OR_DEPARTMENT_OTHER): Payer: Self-pay | Admitting: Physical Therapy

## 2023-02-04 DIAGNOSIS — R2681 Unsteadiness on feet: Secondary | ICD-10-CM

## 2023-02-04 DIAGNOSIS — R42 Dizziness and giddiness: Secondary | ICD-10-CM | POA: Diagnosis not present

## 2023-02-04 DIAGNOSIS — R262 Difficulty in walking, not elsewhere classified: Secondary | ICD-10-CM

## 2023-02-04 NOTE — Therapy (Signed)
OUTPATIENT PHYSICAL THERAPY LOWER EXTREMITY Treatment    Patient Name: Jason [name] MRN: [id number] DOB:04-Apr-1936, 87 y.o., male Today's Date: 02/04/2023  END OF SESSION:       Progress Note Reporting Period 4/18 to 6/11  See note below for Objective Data and Assessment of Progress/Goals.       Past Medical History:  Diagnosis Date   Ascending aortic aneurysm (HCC) 01/22/2022   CAD in native artery 01/22/2022   Cancer [facility])    Cataract    Diabetes mellitus without complication (HCC)    DM (diabetes mellitus) (HCC)    Gait instability 01/22/2022   Hypertension    Sleep apnea    History reviewed. No pertinent surgical history. 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   Obstructive sleep apnea of adult 08/11/2022   Diabetic nephropathy with proteinuria (HCC) 08/11/2022   Obesity 08/11/2022   Hyperlipidemia 08/11/2022   Chronic kidney disease, stage 3a (HCC) 08/11/2022   Anxiety disorder, unspecified 08/11/2022   Diabetes mellitus (HCC) 08/11/2022   Primary malignant neoplasm of prostate (HCC) 08/02/2022   Carcinoma of prostate (HCC) 08/02/2022   Shortness of breath 02/22/2022   Pure hypercholesterolemia 02/22/2022   Pain in joint, shoulder region 02/22/2022   Other malaise and fatigue 02/22/2022   Nonexudative age-related macular degeneration, bilateral, intermediate dry stage 02/22/2022   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   Gastroesophageal reflux disease 02/22/2022   Benign paroxysmal positional vertigo 02/22/2022   CAD in native artery 01/22/2022   Thoracic aortic aneurysm, without rupture, unspecified (HCC) 01/22/2022   Gait instability 01/22/2022   Essential hypertension 01/17/2022   Type 2 diabetes mellitus without complications (HCC) 01/17/2022   HLD (hyperlipidemia) 01/17/2022   Obstructive sleep apnea 01/17/2022   Sensorineural hearing loss, bilateral 11/09/2021   Orthostatic hypotension 11/09/2021   Imbalance  11/09/2021   Overweight 02/21/2020   First degree heart block 02/21/2020   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   Malignant tumor of prostate (HCC) 02/21/2020   Vitamin D deficiency 05/31/2016   History of malignant neoplasm of prostate 05/31/2016   History of malignant neoplasm of skin 05/31/2016   Bilateral hearing loss 05/31/2016   Anxiety state 05/31/2016   Bronchospasm 09/24/2010   Cough 09/19/2010   Acute bronchitis 09/19/2010   Allergic rhinitis due to pollen 09/19/2010   Acute sinusitis 09/19/2010    PCP: Meredith Staggers, MD  REFERRING PROVIDER: Meredith Staggers, MD  REFERRING DIAG: R26.81 (ICD-10-CM) - Unsteadiness   THERAPY DIAG:  No diagnosis found.  Rationale for Evaluation and Treatment: Rehabilitation  ONSET DATE: march 2024  SUBJECTIVE:   SUBJECTIVE STATEMENT: Pt saw cardiologist who is pleased with his progress, per pt report. "He wants me get compression garments for my abodmen and my thighs, but I haven't ordered them yet. I'm not excited about that." "He said my blood pressure is what it is with the medicine I'm on.".Pt bought a new mattress a week ago which is helping his sleep quality.    PERTINENT HISTORY: Type 2 diabetes, sleep apnea, thoracic aortic aneurysm, orthorstatic hypotension  PAIN:  Are you having pain? No  PRECAUTIONS: None  WEIGHT BEARING RESTRICTIONS: No  FALLS:  Has patient fallen in last 6 months? Yes. Number of falls 2, step done on a small step. Another one reaching down and fell  LIVING ENVIRONMENT: Lives with: lives with their family Lives in: House/apartment Stairs: No Has following equipment at home: Single point cane  OCCUPATION: retired   PLOF: Independent  PATIENT  GOALS: walking without pain, being more stable  NEXT MD VISIT: nothing scheduled   OBJECTIVE:      DIAGNOSTIC FINDINGS: nothing pertinent   PATIENT SURVEYS:  FOTO    COGNITION: Overall cognitive status: Within functional limits for tasks  assessed     SENSATION: WFL  EDEMA:   MUSCLE LENGTH:   POSTURE: rounded shoulders  PALPATION:   LOWER EXTREMITY ROM:  Active ROM Right eval Left eval  Hip flexion    Hip extension    Hip abduction    Hip adduction    Hip internal rotation    Hip external rotation    Knee flexion    Knee extension    Ankle dorsiflexion    Ankle plantarflexion    Ankle inversion    Ankle eversion     (Blank rows = not tested)  LOWER EXTREMITY MMT:  MMT Right eval Left eval Right 6/11  Left  6/11  Hip flexion 19.5 19.3 painful 25.0 23.9  Hip extension      Hip abduction 22.5 27.1 42.1 41.2  Hip adduction      Hip internal rotation      Hip external rotation      Knee flexion      Knee extension 27.4 33.2 45.1 42.5  Ankle dorsiflexion      Ankle plantarflexion      Ankle inversion      Ankle eversion       (Blank rows = not tested)  LOWER EXTREMITY SPECIAL TESTS:    5/1: Vitals 139/2mmHg 70bpm  4/26: Vitals: 166/108 at beginning Waited and took again: 168/96 Standing: 133/852 After seated exercise: 181/112 After rest:165/108 At end of session: 167/104  EVAL: Vitals:  Patient Initial blood pressure seated was 165/118.  Patient blood pressure was retaken after a few minutes and was 171/103. Patient blood pressure was also taken standing, 134/78 FUNCTIONAL TESTS:  Eval:TUG test: 16sec- No AD 5xSTS: 27sec (used arms to push up)  6/11  5XSTS 19  TUG 17 without AD   6 min walk test: 2.5 minutes until for seated rest break seated rest break of 2 minutes.  After rest break able to ambulate until 5:30 mark at which time he needed a seated rest break for the rest of the test.  Total distance 527 feet.   GAIT: Patients uses a SPC to ambulate.  Balance:   6/11 Narrow base of support standby assist Narrow base of support eyes closed min assist Tandem stance min assist bilateral Single-leg stance unable to maintain   TODAY'S TREATMENT:                                                                                                                               DATE:  6/21 Nu step L 4  Standing heel raise x20  Standing marches 2x10 (monitor L hip pain)   6/19: BP: 147/93 72bpm in seated BP: 117/74 82 bpm in standing  Nu step L 4 Standing heel raise x20  Standing marches 2x10 (monitor L hip pain) BP 167/96 HR:80 (seated)  Narrow base eyes closed 3x30 seconds (light CGA) Modified tandem stance 2x30 seconds ea BP:165/95 (seated) 67bpm   Gait with head turns xnods- full hallway x1ea. CGA and no SPC use. (SBA/CGA) 131/83 88bpm (standing) Side stepping at rail x1 lap  Biceps curl 2x20 2lb  Punches  x20 2lbs   End of session: 138/87 77 bpm  6/13  B/P 179/106   Biceps curl 3x15 2lb  Punches  2x15 2lbs      6/12  Baseline:170/100 baseline Standing: 134/80  mild syncope noted  Seated Hip abduction 20 green  Seated March x20 Green  Seated LAQ  x20 Green   Reviewed and performed testing with patient.    Narrow base on air-ex 2x30 sec hold EO and EC   Hip abduction seated 2x20     6/6  Baseline BP: seated: 156/101 70bpm  BP in standing:  109/72 72bpm  Nu step 5 minutes L3 BP seated: 169/97 71bpm  Standing heel raise x20  Standing marches 2x10 (monitor L hip pain) BP 148/81 HR:75 (seated)  Narrow base eyes closed 3x30 seconds (light CGA) Modified tandem stance 2x30 seconds ea BP:165/95 (seated) 67bpm  Side stepping at rail x2 laps Gait with head turns xnods- full hallway x1ea. CGA and minimal SPC use.   End of session BP: 169/95 (seated) 74bpm BP:  122/79   (standing) 80bpm  PATIENT EDUCATION:  Education details: POC, Symptom management, HEP Person educated: Patient Education method: Explanation, Demonstration, Tactile cues, Verbal cues, and Handouts Education comprehension: verbalized understanding, returned demonstration, verbal cues required, tactile cues required, and  needs further education  HOME EXERCISE PROGRAM: Access Code: [email or web address] URL: [email or web address] Date: 12/02/2022 Prepared by: Lorayne Bender  Exercises - Seated Hip Abduction with Resistance  - 1 x daily - 7 x weekly - 3 sets - 10 reps - Seated Knee Extension with Resistance  - 1 x daily - 7 x weekly - 3 sets - 10 reps - Seated Knee Lifts with Resistance  - 1 x daily - 7 x weekly - 3 sets - 10 reps  ASSESSMENT:  CLINICAL IMPRESSION:  The patient tolerated treatment well despite fluctuating B/P. We continue to work on strengthening the hip. He could feel the hip with hurdle steps today he will see Dr Vallery Sa next week. His B/P is high and sitting and gets to a more normal level in standing. He reports this is by design at this point. He has been seen by his cardiologist who wants him to continue to work on strengthening.    OBJECTIVE IMPAIRMENTS: decreased activity tolerance, decreased balance, decreased endurance, difficulty walking, decreased strength, and dizziness.   ACTIVITY LIMITATIONS: carrying, lifting, bending, standing, squatting, sleeping, and stairs  PARTICIPATION LIMITATIONS: meal prep, cleaning, laundry, driving, shopping, community activity, and yard work  PERSONAL FACTORS: 3+ comorbidities: type 2 diabetes, orthostatic hypotension, sleep apnea, thoracic aortic anuerysm  are also affecting patient's functional outcome.   REHAB POTENTIAL: Good  CLINICAL DECISION MAKING: Evolving/moderate complexity  EVALUATION COMPLEXITY: Moderate   GOALS: Goals reviewed with patient? Yes  SHORT TERM GOALS: Target date: 5/9 Patient will increase bilateral strength by 5lbs. Baseline: Goal status: Significant improvement in strength overall initial goal which she achieved 6/12 new goal 5 pounds from measurements on 612  2.  Patient will be independent with HEP. Baseline:  Goal status: Performing base HEP at home 6/12  3.  Patient will show minimal fluctuation and  syncope with sit to stand transfer  Baseline:  Goal status: Syncope has improved.  Has not major fluctuations in blood pressure 6/12  LONG TERM GOALS: Target date: 01/27/2023    Patient will transfer sit to stand without loss of balance or syncope  Baseline:  Goal status: MET 6/4  2.  Patient will perform daily activity with no loss of balance  Baseline:  Goal status: IN PROGRESS 6/12 improving but still some baseline balance issues  3.  Patient will be able to walk community distances without any discomfort in left hip or feeling of lost balance .  Baseline:  Goal status: INITIAL      PLAN:  PT FREQUENCY: 1-2x/week  PT DURATION: 10 weeks  PLANNED INTERVENTIONS: Therapeutic exercises, Therapeutic activity, Neuromuscular re-education, Balance training, Gait training, Patient/Family education, Self Care, Joint mobilization, Stair training, Aquatic Therapy, Electrical stimulation, Cryotherapy, Moist heat, Taping, Ultrasound, Ionotophoresis 4mg /ml Dexamethasone, and Manual therapy  PLAN FOR NEXT SESSION: Consider TUG, 5 STS test, 6 minute walk, screen balance; adjust goals review HEP   Lorayne Bender PT DPT   02/04/2023

## 2023-02-08 ENCOUNTER — Encounter (HOSPITAL_BASED_OUTPATIENT_CLINIC_OR_DEPARTMENT_OTHER): Payer: Self-pay | Admitting: Physical Therapy

## 2023-02-08 ENCOUNTER — Ambulatory Visit (HOSPITAL_BASED_OUTPATIENT_CLINIC_OR_DEPARTMENT_OTHER): Payer: Medicare HMO | Admitting: Physical Therapy

## 2023-02-08 DIAGNOSIS — R2681 Unsteadiness on feet: Secondary | ICD-10-CM

## 2023-02-08 DIAGNOSIS — R42 Dizziness and giddiness: Secondary | ICD-10-CM

## 2023-02-08 DIAGNOSIS — R262 Difficulty in walking, not elsewhere classified: Secondary | ICD-10-CM

## 2023-02-08 NOTE — Therapy (Signed)
OUTPATIENT PHYSICAL THERAPY LOWER EXTREMITY Treatment    Patient Name: Jason [name] MRN: [id number] DOB:1936/05/19, 87 y.o., male Today's Date: 02/08/2023  END OF SESSION:  PT End of Session - 02/08/23 0936     Visit Number 14    Number of Visits 26    Date for PT Re-Evaluation 03/23/23    PT Start Time 0930    PT Stop Time 1013    PT Time Calculation (min) 43 min    Activity Tolerance Patient tolerated treatment well    Behavior During Therapy [facility] for tasks assessed/performed                 Progress Note Reporting Period 4/18 to 6/11  See note below for Objective Data and Assessment of Progress/Goals.       Past Medical History:  Diagnosis Date   Ascending aortic aneurysm (HCC) 01/22/2022   CAD in native artery 01/22/2022   Cancer [facility])    Cataract    Diabetes mellitus without complication (HCC)    DM (diabetes mellitus) (HCC)    Gait instability 01/22/2022   Hypertension    Sleep apnea    History reviewed. No pertinent surgical history. 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   Obstructive sleep apnea of adult 08/11/2022   Diabetic nephropathy with proteinuria (HCC) 08/11/2022   Obesity 08/11/2022   Hyperlipidemia 08/11/2022   Chronic kidney disease, stage 3a (HCC) 08/11/2022   Anxiety disorder, unspecified 08/11/2022   Diabetes mellitus (HCC) 08/11/2022   Primary malignant neoplasm of prostate (HCC) 08/02/2022   Carcinoma of prostate (HCC) 08/02/2022   Shortness of breath 02/22/2022   Pure hypercholesterolemia 02/22/2022   Pain in joint, shoulder region 02/22/2022   Other malaise and fatigue 02/22/2022   Nonexudative age-related macular degeneration, bilateral, intermediate dry stage 02/22/2022   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   Gastroesophageal reflux disease 02/22/2022   Benign paroxysmal positional vertigo 02/22/2022   CAD in native artery 01/22/2022   Thoracic aortic aneurysm, without rupture,  unspecified (HCC) 01/22/2022   Gait instability 01/22/2022   Essential hypertension 01/17/2022   Type 2 diabetes mellitus without complications (HCC) 01/17/2022   HLD (hyperlipidemia) 01/17/2022   Obstructive sleep apnea 01/17/2022   Sensorineural hearing loss, bilateral 11/09/2021   Orthostatic hypotension 11/09/2021   Imbalance 11/09/2021   Overweight 02/21/2020   First degree heart block 02/21/2020   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   Malignant tumor of prostate (HCC) 02/21/2020   Vitamin D deficiency 05/31/2016   History of malignant neoplasm of prostate 05/31/2016   History of malignant neoplasm of skin 05/31/2016   Bilateral hearing loss 05/31/2016   Anxiety state 05/31/2016   Bronchospasm 09/24/2010   Cough 09/19/2010   Acute bronchitis 09/19/2010   Allergic rhinitis due to pollen 09/19/2010   Acute sinusitis 09/19/2010    PCP: Meredith Staggers, MD  REFERRING PROVIDER: Meredith Staggers, MD  REFERRING DIAG: R26.81 (ICD-10-CM) - Unsteadiness   THERAPY DIAG:  Dizziness and giddiness  Unsteadiness on feet  Difficulty in walking, not elsewhere classified  Rationale for Evaluation and Treatment: Rehabilitation  ONSET DATE: march 2024  SUBJECTIVE:   SUBJECTIVE STATEMENT: The patient reports he is feeling pretty good today.  His blood pressure has been fairly stable over the past couple days.  PERTINENT HISTORY: Type 2 diabetes, sleep apnea, thoracic aortic aneurysm, orthorstatic hypotension  PAIN:  Are you having pain? No  PRECAUTIONS: None  WEIGHT BEARING RESTRICTIONS: No  FALLS:  Has patient fallen in  last 6 months? Yes. Number of falls 2, step done on a small step. Another one reaching down and fell  LIVING ENVIRONMENT: Lives with: lives with their family Lives in: House/apartment Stairs: No Has following equipment at home: Single point cane  OCCUPATION: retired   PLOF: Independent  PATIENT GOALS: walking without pain, being more stable  NEXT MD  VISIT: nothing scheduled   OBJECTIVE:      DIAGNOSTIC FINDINGS: nothing pertinent   PATIENT SURVEYS:  FOTO    COGNITION: Overall cognitive status: Within functional limits for tasks assessed     SENSATION: WFL  EDEMA:   MUSCLE LENGTH:   POSTURE: rounded shoulders  PALPATION:   LOWER EXTREMITY ROM:  Active ROM Right eval Left eval  Hip flexion    Hip extension    Hip abduction    Hip adduction    Hip internal rotation    Hip external rotation    Knee flexion    Knee extension    Ankle dorsiflexion    Ankle plantarflexion    Ankle inversion    Ankle eversion     (Blank rows = not tested)  LOWER EXTREMITY MMT:  MMT Right eval Left eval Right 6/11  Left  6/11  Hip flexion 19.5 19.3 painful 25.0 23.9  Hip extension      Hip abduction 22.5 27.1 42.1 41.2  Hip adduction      Hip internal rotation      Hip external rotation      Knee flexion      Knee extension 27.4 33.2 45.1 42.5  Ankle dorsiflexion      Ankle plantarflexion      Ankle inversion      Ankle eversion       (Blank rows = not tested)  LOWER EXTREMITY SPECIAL TESTS:    5/1: Vitals 139/63mmHg 70bpm  4/26: Vitals: 166/108 at beginning Waited and took again: 168/96 Standing: 133/852 After seated exercise: 181/112 After rest:165/108 At end of session: 167/104  EVAL: Vitals:  Patient Initial blood pressure seated was 165/118.  Patient blood pressure was retaken after a few minutes and was 171/103. Patient blood pressure was also taken standing, 134/78 FUNCTIONAL TESTS:  Eval:TUG test: 16sec- No AD 5xSTS: 27sec (used arms to push up)  6/11  5XSTS 19  TUG 17 without AD   6 min walk test: 2.5 minutes until for seated rest break seated rest break of 2 minutes.  After rest break able to ambulate until 5:30 mark at which time he needed a seated rest break for the rest of the test.  Total distance 527 feet.   GAIT: Patients uses a SPC to ambulate.  Balance:   6/11 Narrow  base of support standby assist Narrow base of support eyes closed min assist Tandem stance min assist bilateral Single-leg stance unable to maintain   TODAY'S TREATMENT:  DATE:  6/25 Blood pressure baseline 178/88 NuStep 6 minutes level 4  Standing heel raise x20   Narrow base eyes closed 3x30 seconds (light CGA) Modified tandem stance 2x30 seconds ea  Hurdles lateral step over 2 x 10 seated rest break in between sets. Toe taps 2 inch step 2 x 10 each leg with seated rest break in between sets  Airex march x 20  3 pounds weight: Bicep curls 2 x 15 Punches 2 x 15 Double arm press 2 x 15  6/21 Nu step L 4  Standing heel raise x20  Standing marches 2x10 (monitor L hip pain)    6/19: BP: 147/93 72bpm in seated BP: 117/74 82 bpm in standing  Nu step L 4 Standing heel raise x20  Standing marches 2x10 (monitor L hip pain) BP 167/96 HR:80 (seated)  Narrow base eyes closed 3x30 seconds (light CGA) Modified tandem stance 2x30 seconds ea BP:165/95 (seated) 67bpm   Gait with head turns xnods- full hallway x1ea. CGA and no SPC use. (SBA/CGA) 131/83 88bpm (standing) Side stepping at rail x1 lap  Biceps curl 2x20 2lb  Punches  x20 2lbs   End of session: 138/87 77 bpm  6/13  B/P 179/106   Biceps curl 3x15 2lb  Punches  2x15 2lbs      6/12  Baseline:170/100 baseline Standing: 134/80  mild syncope noted  Seated Hip abduction 20 green  Seated March x20 Green  Seated LAQ  x20 Green   Reviewed and performed testing with patient.    Narrow base on air-ex 2x30 sec hold EO and EC   Hip abduction seated 2x20     6/6  Baseline BP: seated: 156/101 70bpm  BP in standing:  109/72 72bpm  Nu step 5 minutes L3 BP seated: 169/97 71bpm  Standing heel raise x20  Standing marches 2x10 (monitor L hip pain) BP  148/81 HR:75 (seated)  Narrow base eyes closed 3x30 seconds (light CGA) Modified tandem stance 2x30 seconds ea BP:165/95 (seated) 67bpm  Side stepping at rail x2 laps Gait with head turns xnods- full hallway x1ea. CGA and minimal SPC use.   End of session BP: 169/95 (seated) 74bpm BP:  122/79   (standing) 80bpm  PATIENT EDUCATION:  Education details: POC, Symptom management, HEP Person educated: Patient Education method: Explanation, Demonstration, Tactile cues, Verbal cues, and Handouts Education comprehension: verbalized understanding, returned demonstration, verbal cues required, tactile cues required, and needs further education  HOME EXERCISE PROGRAM: Access Code: [email or web address] URL: [email or web address] Date: 12/02/2022 Prepared by: Lorayne Bender  Exercises - Seated Hip Abduction with Resistance  - 1 x daily - 7 x weekly - 3 sets - 10 reps - Seated Knee Extension with Resistance  - 1 x daily - 7 x weekly - 3 sets - 10 reps - Seated Knee Lifts with Resistance  - 1 x daily - 7 x weekly - 3 sets - 10 reps  ASSESSMENT:  CLINICAL IMPRESSION: The patients B/P has been responding well to exercises. His B/P at baseline today was 178/88. After exercises it was measured at 158/80.  He reported minor fatigue after exercises but overall tolerated well.  We focused on upper extremity exercises today.  He also perform balance exercises on the Airex mat.  Therapy will continue to progress as tolerated.   OBJECTIVE IMPAIRMENTS: decreased activity tolerance, decreased balance, decreased endurance, difficulty walking, decreased strength, and dizziness.   ACTIVITY LIMITATIONS: carrying, lifting, bending, standing, squatting, sleeping, and stairs  PARTICIPATION LIMITATIONS: meal prep, cleaning,  laundry, driving, shopping, community activity, and yard work  PERSONAL FACTORS: 3+ comorbidities: type 2 diabetes, orthostatic hypotension, sleep apnea, thoracic aortic anuerysm  are  also affecting patient's functional outcome.   REHAB POTENTIAL: Good  CLINICAL DECISION MAKING: Evolving/moderate complexity  EVALUATION COMPLEXITY: Moderate   GOALS: Goals reviewed with patient? Yes  SHORT TERM GOALS: Target date: 5/9 Patient will increase bilateral strength by 5lbs. Baseline: Goal status: Significant improvement in strength overall initial goal which she achieved 6/12 new goal 5 pounds from measurements on 612  2.  Patient will be independent with HEP. Baseline:  Goal status: Performing base HEP at home 6/12  3.  Patient will show minimal fluctuation and syncope with sit to stand transfer  Baseline:  Goal status: Syncope has improved.  Has not major fluctuations in blood pressure 6/12  LONG TERM GOALS: Target date: 01/27/2023    Patient will transfer sit to stand without loss of balance or syncope  Baseline:  Goal status: MET 6/4  2.  Patient will perform daily activity with no loss of balance  Baseline:  Goal status: IN PROGRESS 6/12 improving but still some baseline balance issues  3.  Patient will be able to walk community distances without any discomfort in left hip or feeling of lost balance .  Baseline:  Goal status: INITIAL      PLAN:  PT FREQUENCY: 1-2x/week  PT DURATION: 10 weeks  PLANNED INTERVENTIONS: Therapeutic exercises, Therapeutic activity, Neuromuscular re-education, Balance training, Gait training, Patient/Family education, Self Care, Joint mobilization, Stair training, Aquatic Therapy, Electrical stimulation, Cryotherapy, Moist heat, Taping, Ultrasound, Ionotophoresis 4mg /ml Dexamethasone, and Manual therapy  PLAN FOR NEXT SESSION: Consider TUG, 5 STS test, 6 minute walk, screen balance; adjust goals review HEP   Lorayne Bender PT DPT  02/08/2023

## 2023-02-09 DIAGNOSIS — M7062 Trochanteric bursitis, left hip: Secondary | ICD-10-CM | POA: Diagnosis not present

## 2023-02-09 DIAGNOSIS — M1612 Unilateral primary osteoarthritis, left hip: Secondary | ICD-10-CM | POA: Diagnosis not present

## 2023-02-10 ENCOUNTER — Encounter (HOSPITAL_BASED_OUTPATIENT_CLINIC_OR_DEPARTMENT_OTHER): Payer: Self-pay | Admitting: Physical Therapy

## 2023-02-10 ENCOUNTER — Ambulatory Visit (HOSPITAL_BASED_OUTPATIENT_CLINIC_OR_DEPARTMENT_OTHER): Payer: Medicare HMO | Admitting: Physical Therapy

## 2023-02-10 DIAGNOSIS — R2681 Unsteadiness on feet: Secondary | ICD-10-CM | POA: Diagnosis not present

## 2023-02-10 DIAGNOSIS — R42 Dizziness and giddiness: Secondary | ICD-10-CM | POA: Diagnosis not present

## 2023-02-10 DIAGNOSIS — R262 Difficulty in walking, not elsewhere classified: Secondary | ICD-10-CM | POA: Diagnosis not present

## 2023-02-10 NOTE — Therapy (Signed)
OUTPATIENT PHYSICAL THERAPY LOWER EXTREMITY Treatment    Patient Name: Jason [name] MRN: [id number] DOB:05-12-1936, 87 y.o., male Today's Date: 02/08/2023  END OF SESSION:  PT End of Session - 02/08/23 0936     Visit Number 14    Number of Visits 26    Date for PT Re-Evaluation 03/23/23    PT Start Time 0930    PT Stop Time 1013    PT Time Calculation (min) 43 min    Activity Tolerance Patient tolerated treatment well    Behavior During Therapy [facility] for tasks assessed/performed                 Progress Note Reporting Period 4/18 to 6/11  See note below for Objective Data and Assessment of Progress/Goals.       Past Medical History:  Diagnosis Date   Ascending aortic aneurysm (HCC) 01/22/2022   CAD in native artery 01/22/2022   [facility]cer [facility])    Cataract    Diabetes mellitus without complication (HCC)    DM (diabetes mellitus) (HCC)    Gait instability 01/22/2022   Hypertension    Sleep apnea    History reviewed. No pertinent surgical history. 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   Obstructive sleep apnea of adult 08/11/2022   Diabetic nephropathy with proteinuria (HCC) 08/11/2022   Obesity 08/11/2022   Hyperlipidemia 08/11/2022   Chronic kidney disease, stage 3a (HCC) 08/11/2022   Anxiety disorder, unspecified 08/11/2022   Diabetes mellitus (HCC) 08/11/2022   Primary malignant neoplasm of prostate (HCC) 08/02/2022   Carcinoma of prostate (HCC) 08/02/2022   Shortness of breath 02/22/2022   Pure hypercholesterolemia 02/22/2022   Pain in joint, shoulder region 02/22/2022   Other malaise and fatigue 02/22/2022   Nonexudative age-related macular degeneration, bilateral, intermediate dry stage 02/22/2022   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   Gastroesophageal reflux disease 02/22/2022   Benign paroxysmal positional vertigo 02/22/2022   CAD in native artery 01/22/2022   Thoracic aortic aneurysm, without rupture,  unspecified (HCC) 01/22/2022   Gait instability 01/22/2022   Essential hypertension 01/17/2022   Type 2 diabetes mellitus without complications (HCC) 01/17/2022   HLD (hyperlipidemia) 01/17/2022   Obstructive sleep apnea 01/17/2022   Sensorineural hearing loss, bilateral 11/09/2021   Orthostatic hypotension 11/09/2021   Imbalance 11/09/2021   Overweight 02/21/2020   First degree heart block 02/21/2020   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   Malignant tumor of prostate (HCC) 02/21/2020   Vitamin D deficiency 05/31/2016   History of malignant neoplasm of prostate 05/31/2016   History of malignant neoplasm of skin 05/31/2016   Bilateral hearing loss 05/31/2016   Anxiety state 05/31/2016   Bronchospasm 09/24/2010   Cough 09/19/2010   Acute bronchitis 09/19/2010   Allergic rhinitis due to pollen 09/19/2010   Acute sinusitis 09/19/2010    PCP: Meredith Staggers, MD  REFERRING PROVIDER: Meredith Staggers, MD  REFERRING DIAG: R26.81 (ICD-10-CM) - Unsteadiness   THERAPY DIAG:  Dizziness and giddiness  Unsteadiness on feet  Difficulty in walking, not elsewhere classified  Rationale for Evaluation and Treatment: Rehabilitation  ONSET DATE: march 2024  SUBJECTIVE:   SUBJECTIVE STATEMENT: The patient reports he is feeling pretty good today.  His blood pressure has been fairly stable over the past couple days.  PERTINENT HISTORY: Type 2 diabetes, sleep apnea, thoracic aortic aneurysm, orthorstatic hypotension  PAIN:  Are you having pain? No  PRECAUTIONS: None  WEIGHT BEARING RESTRICTIONS: No  FALLS:  Has patient fallen in  last 6 months? Yes. Number of falls 2, step done on a small step. Another one reaching down and fell  LIVING ENVIRONMENT: Lives with: lives with their family Lives in: House/apartment Stairs: No Has following equipment at home: Single point cane  OCCUPATION: retired   PLOF: Independent  PATIENT GOALS: walking without pain, being more stable  NEXT MD  VISIT: nothing scheduled   OBJECTIVE:      DIAGNOSTIC FINDINGS: nothing pertinent   PATIENT SURVEYS:  FOTO    COGNITION: Overall cognitive status: Within functional limits for tasks assessed     SENSATION: WFL  EDEMA:   MUSCLE LENGTH:   POSTURE: rounded shoulders  PALPATION:   LOWER EXTREMITY ROM:  Active ROM Right eval Left eval  Hip flexion    Hip extension    Hip abduction    Hip adduction    Hip internal rotation    Hip external rotation    Knee flexion    Knee extension    Ankle dorsiflexion    Ankle plantarflexion    Ankle inversion    Ankle eversion     (Blank rows = not tested)  LOWER EXTREMITY MMT:  MMT Right eval Left eval Right 6/11  Left  6/11  Hip flexion 19.5 19.3 painful 25.0 23.9  Hip extension      Hip abduction 22.5 27.1 42.1 41.2  Hip adduction      Hip internal rotation      Hip external rotation      Knee flexion      Knee extension 27.4 33.2 45.1 42.5  Ankle dorsiflexion      Ankle plantarflexion      Ankle inversion      Ankle eversion       (Blank rows = not tested)  LOWER EXTREMITY SPECIAL TESTS:    5/1: Vitals 139/56mmHg 70bpm  4/26: Vitals: 166/108 at beginning Waited and took again: 168/96 Standing: 133/852 After seated exercise: 181/112 After rest:165/108 At end of session: 167/104  EVAL: Vitals:  Patient Initial blood pressure seated was 165/118.  Patient blood pressure was retaken after a few minutes and was 171/103. Patient blood pressure was also taken standing, 134/78 FUNCTIONAL TESTS:  Eval:TUG test: 16sec- No AD 5xSTS: 27sec (used arms to push up)  6/11  5XSTS 19  TUG 17 without AD   6 min walk test: 2.5 minutes until for seated rest break seated rest break of 2 minutes.  After rest break able to ambulate until 5:30 mark at which time he needed a seated rest break for the rest of the test.  Total distance 527 feet.   GAIT: Patients uses a SPC to ambulate.  Balance:   6/11 Narrow  base of support standby assist Narrow base of support eyes closed min assist Tandem stance min assist bilateral Single-leg stance unable to maintain   TODAY'S TREATMENT:  DATE:  6/27 Taken after nu step Blood pressure baseline 190/90 NuStep 6 minutes level 4  Standing 150/80  Seted hip abuction 3x15  Seated LAQ 3x10 red felt it in his left hip   After 2 earciss B/P take again   170/100. Treatment halted    6/25 Blood pressure baseline 178/88 NuStep 6 minutes level 4  Standing heel raise x20   Narrow base eyes closed 3x30 seconds (light CGA) Modified tandem stance 2x30 seconds ea  Hurdles lateral step over 2 x 10 seated rest break in between sets. Toe taps 2 inch step 2 x 10 each leg with seated rest break in between sets  Airex march x 20  3 pounds weight: Bicep curls 2 x 15 Punches 2 x 15 Double arm press 2 x 15  6/21 Nu step L 4  Standing heel raise x20  Standing marches 2x10 (monitor L hip pain)    6/19: BP: 147/93 72bpm in seated BP: 117/74 82 bpm in standing  Nu step L 4 Standing heel raise x20  Standing marches 2x10 (monitor L hip pain) BP 167/96 HR:80 (seated)  Narrow base eyes closed 3x30 seconds (light CGA) Modified tandem stance 2x30 seconds ea BP:165/95 (seated) 67bpm   Gait with head turns xnods- full hallway x1ea. CGA and no SPC use. (SBA/CGA) 131/83 88bpm (standing) Side stepping at rail x1 lap  Biceps curl 2x20 2lb  Punches  x20 2lbs   End of session: 138/87 77 bpm  6/13  B/P 179/106   Biceps curl 3x15 2lb  Punches  2x15 2lbs      6/12  Baseline:170/100 baseline Standing: 134/80  mild syncope noted  Seated Hip abduction 20 green  Seated March x20 Green  Seated LAQ  x20 Green   Reviewed and performed testing with patient.    Narrow base on air-ex 2x30 sec hold EO and EC    Hip abduction seated 2x20     6/6  Baseline BP: seated: 156/101 70bpm  BP in standing:  109/72 72bpm  Nu step 5 minutes L3 BP seated: 169/97 71bpm  Standing heel raise x20  Standing marches 2x10 (monitor L hip pain) BP 148/81 HR:75 (seated)  Narrow base eyes closed 3x30 seconds (light CGA) Modified tandem stance 2x30 seconds ea BP:165/95 (seated) 67bpm  Side stepping at rail x2 laps Gait with head turns xnods- full hallway x1ea. CGA and minimal SPC use.   End of session BP: 169/95 (seated) 74bpm BP:  122/79   (standing) 80bpm  PATIENT EDUCATION:  Education details: POC, Symptom management, HEP Person educated: Patient Education method: Explanation, Demonstration, Tactile cues, Verbal cues, and Handouts Education comprehension: verbalized understanding, returned demonstration, verbal cues required, tactile cues required, and needs further education  HOME EXERCISE PROGRAM: Access Code: [email or web address] URL: [email or web address] Date: 12/02/2022 Prepared by: Lorayne Bender  Exercises - Seated Hip Abduction with Resistance  - 1 x daily - 7 x weekly - 3 sets - 10 reps - Seated Knee Extension with Resistance  - 1 x daily - 7 x weekly - 3 sets - 10 reps - Seated Knee Lifts with Resistance  - 1 x daily - 7 x weekly - 3 sets - 10 reps  ASSESSMENT:  CLINICAL IMPRESSION: The patient was limited today. He had had his cortisone shot which caused his blood sugar to spike. He tolerated exercise but felt fatigued and was found to have higher B/P. Therapy will continue to advance patient as tolerated.   OBJECTIVE IMPAIRMENTS: decreased activity tolerance,  decreased balance, decreased endurance, difficulty walking, decreased strength, and dizziness.   ACTIVITY LIMITATIONS: carrying, lifting, bending, standing, squatting, sleeping, and stairs  PARTICIPATION LIMITATIONS: meal prep, cleaning, laundry, driving, shopping, community activity, and yard work  PERSONAL  FACTORS: 3+ comorbidities: type 2 diabetes, orthostatic hypotension, sleep apnea, thoracic aortic anuerysm  are also affecting patient's functional outcome.   REHAB POTENTIAL: Good  CLINICAL DECISION MAKING: Evolving/moderate complexity  EVALUATION COMPLEXITY: Moderate   GOALS: Goals reviewed with patient? Yes  SHORT TERM GOALS: Target date: 5/9 Patient will increase bilateral strength by 5lbs. Baseline: Goal status: Significant improvement in strength overall initial goal which she achieved 6/12 new goal 5 pounds from measurements on 612  2.  Patient will be independent with HEP. Baseline:  Goal status: Performing base HEP at home 6/12  3.  Patient will show minimal fluctuation and syncope with sit to stand transfer  Baseline:  Goal status: Syncope has improved.  Has not major fluctuations in blood pressure 6/12  LONG TERM GOALS: Target date: 01/27/2023    Patient will transfer sit to stand without loss of balance or syncope  Baseline:  Goal status: MET 6/4  2.  Patient will perform daily activity with no loss of balance  Baseline:  Goal status: IN PROGRESS 6/12 improving but still some baseline balance issues  3.  Patient will be able to walk community distances without any discomfort in left hip or feeling of lost balance .  Baseline:  Goal status: INITIAL      PLAN:  PT FREQUENCY: 1-2x/week  PT DURATION: 10 weeks  PLANNED INTERVENTIONS: Therapeutic exercises, Therapeutic activity, Neuromuscular re-education, Balance training, Gait training, Patient/Family education, Self Care, Joint mobilization, Stair training, Aquatic Therapy, Electrical stimulation, Cryotherapy, Moist heat, Taping, Ultrasound, Ionotophoresis 4mg /ml Dexamethasone, and Manual therapy  PLAN FOR NEXT SESSION: Consider TUG, 5 STS test, 6 minute walk, screen balance; adjust goals review HEP   Lorayne Bender PT DPT  02/08/2023

## 2023-02-15 ENCOUNTER — Ambulatory Visit (HOSPITAL_BASED_OUTPATIENT_CLINIC_OR_DEPARTMENT_OTHER): Payer: Medicare HMO | Attending: Family Medicine

## 2023-02-15 ENCOUNTER — Encounter (HOSPITAL_BASED_OUTPATIENT_CLINIC_OR_DEPARTMENT_OTHER): Payer: Self-pay

## 2023-02-15 DIAGNOSIS — R262 Difficulty in walking, not elsewhere classified: Secondary | ICD-10-CM | POA: Diagnosis not present

## 2023-02-15 DIAGNOSIS — R2681 Unsteadiness on feet: Secondary | ICD-10-CM | POA: Diagnosis not present

## 2023-02-15 DIAGNOSIS — R42 Dizziness and giddiness: Secondary | ICD-10-CM | POA: Insufficient documentation

## 2023-02-15 NOTE — Therapy (Signed)
OUTPATIENT PHYSICAL THERAPY LOWER EXTREMITY Treatment    Patient Name: Jason [name] MRN: [id number] DOB:1936/07/21, 87 y.o., male Today's Date: 02/15/2023  END OF SESSION:  PT End of Session - 02/15/23 1109     Visit Number 16    Number of Visits 26    Date for PT Re-Evaluation 03/23/23    PT Start Time 1053    PT Stop Time 1133    PT Time Calculation (min) 40 min    Activity Tolerance Patient tolerated treatment well    Behavior During Therapy [facility] for tasks assessed/performed                  Progress Note Reporting Period 4/18 to 6/11  See note below for Objective Data and Assessment of Progress/Goals.       Past Medical History:  Diagnosis Date   Ascending aortic aneurysm (HCC) 01/22/2022   CAD in native artery 01/22/2022   Cancer [facility])    Cataract    Diabetes mellitus without complication (HCC)    DM (diabetes mellitus) (HCC)    Gait instability 01/22/2022   Hypertension    Sleep apnea    History reviewed. No pertinent surgical history. 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   Obstructive sleep apnea of adult 08/11/2022   Diabetic nephropathy with proteinuria (HCC) 08/11/2022   Obesity 08/11/2022   Hyperlipidemia 08/11/2022   Chronic kidney disease, stage 3a (HCC) 08/11/2022   Anxiety disorder, unspecified 08/11/2022   Diabetes mellitus (HCC) 08/11/2022   Primary malignant neoplasm of prostate (HCC) 08/02/2022   Carcinoma of prostate (HCC) 08/02/2022   Shortness of breath 02/22/2022   Pure hypercholesterolemia 02/22/2022   Pain in joint, shoulder region 02/22/2022   Other malaise and fatigue 02/22/2022   Nonexudative age-related macular degeneration, bilateral, intermediate dry stage 02/22/2022   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   Gastroesophageal reflux disease 02/22/2022   Benign paroxysmal positional vertigo 02/22/2022   CAD in native artery 01/22/2022   Thoracic aortic aneurysm, without rupture,  unspecified (HCC) 01/22/2022   Gait instability 01/22/2022   Essential hypertension 01/17/2022   Type 2 diabetes mellitus without complications (HCC) 01/17/2022   HLD (hyperlipidemia) 01/17/2022   Obstructive sleep apnea 01/17/2022   Sensorineural hearing loss, bilateral 11/09/2021   Orthostatic hypotension 11/09/2021   Imbalance 11/09/2021   Overweight 02/21/2020   First degree heart block 02/21/2020   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   Malignant tumor of prostate (HCC) 02/21/2020   Vitamin D deficiency 05/31/2016   History of malignant neoplasm of prostate 05/31/2016   History of malignant neoplasm of skin 05/31/2016   Bilateral hearing loss 05/31/2016   Anxiety state 05/31/2016   Bronchospasm 09/24/2010   Cough 09/19/2010   Acute bronchitis 09/19/2010   Allergic rhinitis due to pollen 09/19/2010   Acute sinusitis 09/19/2010    PCP: Meredith Staggers, MD  REFERRING PROVIDER: Meredith Staggers, MD  REFERRING DIAG: R26.81 (ICD-10-CM) - Unsteadiness   THERAPY DIAG:  Dizziness and giddiness  Unsteadiness on feet  Difficulty in walking, not elsewhere classified  Rationale for Evaluation and Treatment: Rehabilitation  ONSET DATE: march 2024  SUBJECTIVE:   SUBJECTIVE STATEMENT: Pt reports his hip pain has resolved since injection. Very mild light headedness this morning.   PERTINENT HISTORY: Type 2 diabetes, sleep apnea, thoracic aortic aneurysm, orthorstatic hypotension  PAIN:  Are you having pain? No  PRECAUTIONS: None  WEIGHT BEARING RESTRICTIONS: No  FALLS:  Has patient fallen in last 6 months? Yes. Number  of falls 2, step done on a small step. Another one reaching down and fell  LIVING ENVIRONMENT: Lives with: lives with their family Lives in: House/apartment Stairs: No Has following equipment at home: Single point cane  OCCUPATION: retired   PLOF: Independent  PATIENT GOALS: walking without pain, being more stable  NEXT MD VISIT: nothing scheduled    OBJECTIVE:      DIAGNOSTIC FINDINGS: nothing pertinent   PATIENT SURVEYS:  FOTO    COGNITION: Overall cognitive status: Within functional limits for tasks assessed     SENSATION: WFL  EDEMA:   MUSCLE LENGTH:   POSTURE: rounded shoulders  PALPATION:   LOWER EXTREMITY ROM:  Active ROM Right eval Left eval  Hip flexion    Hip extension    Hip abduction    Hip adduction    Hip internal rotation    Hip external rotation    Knee flexion    Knee extension    Ankle dorsiflexion    Ankle plantarflexion    Ankle inversion    Ankle eversion     (Blank rows = not tested)  LOWER EXTREMITY MMT:  MMT Right eval Left eval Right 6/11  Left  6/11  Hip flexion 19.5 19.3 painful 25.0 23.9  Hip extension      Hip abduction 22.5 27.1 42.1 41.2  Hip adduction      Hip internal rotation      Hip external rotation      Knee flexion      Knee extension 27.4 33.2 45.1 42.5  Ankle dorsiflexion      Ankle plantarflexion      Ankle inversion      Ankle eversion       (Blank rows = not tested)  LOWER EXTREMITY SPECIAL TESTS:    5/1: Vitals 139/25mmHg 70bpm  4/26: Vitals: 166/108 at beginning Waited and took again: 168/96 Standing: 133/852 After seated exercise: 181/112 After rest:165/108 At end of session: 167/104  EVAL: Vitals:  Patient Initial blood pressure seated was 165/118.  Patient blood pressure was retaken after a few minutes and was 171/103. Patient blood pressure was also taken standing, 134/78 FUNCTIONAL TESTS:  Eval:TUG test: 16sec- No AD 5xSTS: 27sec (used arms to push up)  6/11  5XSTS 19  TUG 17 without AD   6 min walk test: 2.5 minutes until for seated rest break seated rest break of 2 minutes.  After rest break able to ambulate until 5:30 mark at which time he needed a seated rest break for the rest of the test.  Total distance 527 feet.   GAIT: Patients uses a SPC to ambulate.  Balance:   6/11 Narrow base of support standby  assist Narrow base of support eyes closed min assist Tandem stance min assist bilateral Single-leg stance unable to maintain   TODAY'S TREATMENT:  DATE:   7/2 Prior to session: 138/76 (seated) 79bpm  Standing: 112/78 81bpm NuStep 6 minutes level 5 156/94 after nu step 80bpm  Standing toe taps 2" box alternating- CGA no UE assist x20ea Hurdle step overs fwd/back (rail use and CGA) 2x10ea 164/93 82bpm 82bpm Gait in hall x55ft Sidestepping at rail- x1lap Lateral hurdle step overs x10ea (rail use and SBA) 157/96 80bpm  6/27 Taken after nu step Blood pressure baseline 190/90 NuStep 6 minutes level 4  Standing 150/80  Seted hip abuction 3x15  Seated LAQ 3x10 red felt it in his left hip   After 2 earciss B/P take again   170/100. Treatment halted    6/25 Blood pressure baseline 178/88 NuStep 6 minutes level 4  Standing heel raise x20   Narrow base eyes closed 3x30 seconds (light CGA) Modified tandem stance 2x30 seconds ea  Hurdles lateral step over 2 x 10 seated rest break in between sets. Toe taps 2 inch step 2 x 10 each leg with seated rest break in between sets  Airex march x 20  3 pounds weight: Bicep curls 2 x 15 Punches 2 x 15 Double arm press 2 x 15  6/21 Nu step L 4  Standing heel raise x20  Standing marches 2x10 (monitor L hip pain)    PATIENT EDUCATION:  Education details: POC, Symptom management, HEP Person educated: Patient Education method: Explanation, Demonstration, Tactile cues, Verbal cues, and Handouts Education comprehension: verbalized understanding, returned demonstration, verbal cues required, tactile cues required, and needs further education  HOME EXERCISE PROGRAM: Access Code: [email or web address] URL: [email or web address] Date: 12/02/2022 Prepared by: Lorayne Bender  Exercises -  Seated Hip Abduction with Resistance  - 1 x daily - 7 x weekly - 3 sets - 10 reps - Seated Knee Extension with Resistance  - 1 x daily - 7 x weekly - 3 sets - 10 reps - Seated Knee Lifts with Resistance  - 1 x daily - 7 x weekly - 3 sets - 10 reps  ASSESSMENT:  CLINICAL IMPRESSION: Blood pressure lower than usual today at start of session. Very mild light headedness/dizziness reported after nu-step, this increased slightly after standing exercise. Pt not limited with exercises today due to hip pain. Denied any hip discomfort throughout session. Monitored BP frequently throughout session. BP did raise with exercise, though not as significantly as usual.    OBJECTIVE IMPAIRMENTS: decreased activity tolerance, decreased balance, decreased endurance, difficulty walking, decreased strength, and dizziness.   ACTIVITY LIMITATIONS: carrying, lifting, bending, standing, squatting, sleeping, and stairs  PARTICIPATION LIMITATIONS: meal prep, cleaning, laundry, driving, shopping, community activity, and yard work  PERSONAL FACTORS: 3+ comorbidities: type 2 diabetes, orthostatic hypotension, sleep apnea, thoracic aortic anuerysm  are also affecting patient's functional outcome.   REHAB POTENTIAL: Good  CLINICAL DECISION MAKING: Evolving/moderate complexity  EVALUATION COMPLEXITY: Moderate   GOALS: Goals reviewed with patient? Yes  SHORT TERM GOALS: Target date: 5/9 Patient will increase bilateral strength by 5lbs. Baseline: Goal status: Significant improvement in strength overall initial goal which she achieved 6/12 new goal 5 pounds from measurements on 612  2.  Patient will be independent with HEP. Baseline:  Goal status: Performing base HEP at home 6/12  3.  Patient will show minimal fluctuation and syncope with sit to stand transfer  Baseline:  Goal status: Syncope has improved.  Has not major fluctuations in blood pressure 6/12  LONG TERM GOALS: Target date: 01/27/2023    Patient  will transfer sit to stand without loss  of balance or syncope  Baseline:  Goal status: MET 6/4  2.  Patient will perform daily activity with no loss of balance  Baseline:  Goal status: IN PROGRESS 6/12 improving but still some baseline balance issues  3.  Patient will be able to walk community distances without any discomfort in left hip or feeling of lost balance .  Baseline:  Goal status: INITIAL      PLAN:  PT FREQUENCY: 1-2x/week  PT DURATION: 10 weeks  PLANNED INTERVENTIONS: Therapeutic exercises, Therapeutic activity, Neuromuscular re-education, Balance training, Gait training, Patient/Family education, Self Care, Joint mobilization, Stair training, Aquatic Therapy, Electrical stimulation, Cryotherapy, Moist heat, Taping, Ultrasound, Ionotophoresis 4mg /ml Dexamethasone, and Manual therapy  PLAN FOR NEXT SESSION: Consider TUG, 5 STS test, 6 minute walk, screen balance; adjust goals review HEP   Riki Altes, PTA   02/15/23

## 2023-02-21 ENCOUNTER — Other Ambulatory Visit: Payer: Self-pay | Admitting: Nurse Practitioner

## 2023-02-21 DIAGNOSIS — I951 Orthostatic hypotension: Secondary | ICD-10-CM

## 2023-02-22 ENCOUNTER — Ambulatory Visit (HOSPITAL_BASED_OUTPATIENT_CLINIC_OR_DEPARTMENT_OTHER): Payer: Medicare HMO

## 2023-02-22 ENCOUNTER — Encounter (HOSPITAL_BASED_OUTPATIENT_CLINIC_OR_DEPARTMENT_OTHER): Payer: Self-pay

## 2023-02-22 DIAGNOSIS — R262 Difficulty in walking, not elsewhere classified: Secondary | ICD-10-CM

## 2023-02-22 DIAGNOSIS — R42 Dizziness and giddiness: Secondary | ICD-10-CM

## 2023-02-22 DIAGNOSIS — R2681 Unsteadiness on feet: Secondary | ICD-10-CM

## 2023-02-22 NOTE — Therapy (Signed)
OUTPATIENT PHYSICAL THERAPY LOWER EXTREMITY Treatment    Patient Name: Jason [name] MRN: [id number] DOB:Nov 25, 1935, 87 y.o., male Today's Date: 02/22/2023  END OF SESSION:  PT End of Session - 02/22/23 0949     Visit Number 16    Number of Visits 26    Date for PT Re-Evaluation 03/23/23    PT Start Time 0935    PT Stop Time 0945    PT Time Calculation (min) 10 min    Activity Tolerance Other (comment)                  Progress Note Reporting Period 4/18 to 6/11  See note below for Objective Data and Assessment of Progress/Goals.       Past Medical History:  Diagnosis Date   Ascending aortic aneurysm (HCC) 01/22/2022   CAD in native artery 01/22/2022   Cancer [facility])    Cataract    Diabetes mellitus without complication (HCC)    DM (diabetes mellitus) (HCC)    Gait instability 01/22/2022   Hypertension    Sleep apnea    History reviewed. No pertinent surgical history. 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   Obstructive sleep apnea of adult 08/11/2022   Diabetic nephropathy with proteinuria (HCC) 08/11/2022   Obesity 08/11/2022   Hyperlipidemia 08/11/2022   Chronic kidney disease, stage 3a (HCC) 08/11/2022   Anxiety disorder, unspecified 08/11/2022   Diabetes mellitus (HCC) 08/11/2022   Primary malignant neoplasm of prostate (HCC) 08/02/2022   Carcinoma of prostate (HCC) 08/02/2022   Shortness of breath 02/22/2022   Pure hypercholesterolemia 02/22/2022   Pain in joint, shoulder region 02/22/2022   Other malaise and fatigue 02/22/2022   Nonexudative age-related macular degeneration, bilateral, intermediate dry stage 02/22/2022   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   Gastroesophageal reflux disease 02/22/2022   Benign paroxysmal positional vertigo 02/22/2022   CAD in native artery 01/22/2022   Thoracic aortic aneurysm, without rupture, unspecified (HCC) 01/22/2022   Gait instability 01/22/2022   Essential  hypertension 01/17/2022   Type 2 diabetes mellitus without complications (HCC) 01/17/2022   HLD (hyperlipidemia) 01/17/2022   Obstructive sleep apnea 01/17/2022   Sensorineural hearing loss, bilateral 11/09/2021   Orthostatic hypotension 11/09/2021   Imbalance 11/09/2021   Overweight 02/21/2020   First degree heart block 02/21/2020   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   Malignant tumor of prostate (HCC) 02/21/2020   Vitamin D deficiency 05/31/2016   History of malignant neoplasm of prostate 05/31/2016   History of malignant neoplasm of skin 05/31/2016   Bilateral hearing loss 05/31/2016   Anxiety state 05/31/2016   Bronchospasm 09/24/2010   Cough 09/19/2010   Acute bronchitis 09/19/2010   Allergic rhinitis due to pollen 09/19/2010   Acute sinusitis 09/19/2010    PCP: Meredith Staggers, MD  REFERRING PROVIDER: Meredith Staggers, MD  REFERRING DIAG: R26.81 (ICD-10-CM) - Unsteadiness   THERAPY DIAG:  Dizziness and giddiness  Unsteadiness on feet  Difficulty in walking, not elsewhere classified  Rationale for Evaluation and Treatment: Rehabilitation  ONSET DATE: march 2024  SUBJECTIVE:   SUBJECTIVE STATEMENT: Pt arrived for session. BP measured 2x seated and 1x standing. See clinical impression. Due to low values and subjective report of lightheadedness, decided to cancel today's session. Instructed pt to monitor his BP throughout the day and to hydrate. Pt to call MD if symptoms worsen.  PERTINENT HISTORY: Type 2 diabetes, sleep apnea, thoracic aortic aneurysm, orthorstatic hypotension  PAIN:  Are you having pain? No  PRECAUTIONS: None  WEIGHT BEARING RESTRICTIONS: No  FALLS:  Has patient fallen in last 6 months? Yes. Number of falls 2, step done on a small step. Another one reaching down and fell  LIVING ENVIRONMENT: Lives with: lives with their family Lives in: House/apartment Stairs: No Has following equipment at home: Single point cane  OCCUPATION: retired    PLOF: Independent  PATIENT GOALS: walking without pain, being more stable  NEXT MD VISIT: nothing scheduled   OBJECTIVE:      DIAGNOSTIC FINDINGS: nothing pertinent   PATIENT SURVEYS:  FOTO    COGNITION: Overall cognitive status: Within functional limits for tasks assessed     SENSATION: WFL  EDEMA:   MUSCLE LENGTH:   POSTURE: rounded shoulders  PALPATION:   LOWER EXTREMITY ROM:  Active ROM Right eval Left eval  Hip flexion    Hip extension    Hip abduction    Hip adduction    Hip internal rotation    Hip external rotation    Knee flexion    Knee extension    Ankle dorsiflexion    Ankle plantarflexion    Ankle inversion    Ankle eversion     (Blank rows = not tested)  LOWER EXTREMITY MMT:  MMT Right eval Left eval Right 6/11  Left  6/11  Hip flexion 19.5 19.3 painful 25.0 23.9  Hip extension      Hip abduction 22.5 27.1 42.1 41.2  Hip adduction      Hip internal rotation      Hip external rotation      Knee flexion      Knee extension 27.4 33.2 45.1 42.5  Ankle dorsiflexion      Ankle plantarflexion      Ankle inversion      Ankle eversion       (Blank rows = not tested)  LOWER EXTREMITY SPECIAL TESTS:    5/1: Vitals 139/42mmHg 70bpm  4/26: Vitals: 166/108 at beginning Waited and took again: 168/96 Standing: 133/852 After seated exercise: 181/112 After rest:165/108 At end of session: 167/104  EVAL: Vitals:  Patient Initial blood pressure seated was 165/118.  Patient blood pressure was retaken after a few minutes and was 171/103. Patient blood pressure was also taken standing, 134/78 FUNCTIONAL TESTS:  Eval:TUG test: 16sec- No AD 5xSTS: 27sec (used arms to push up)  6/11  5XSTS 19  TUG 17 without AD   6 min walk test: 2.5 minutes until for seated rest break seated rest break of 2 minutes.  After rest break able to ambulate until 5:30 mark at which time he needed a seated rest break for the rest of the test.  Total  distance 527 feet.   GAIT: Patients uses a SPC to ambulate.  Balance:   6/11 Narrow base of support standby assist Narrow base of support eyes closed min assist Tandem stance min assist bilateral Single-leg stance unable to maintain   TODAY'S TREATMENT:  DATE:   7/2 Prior to session: 138/76 (seated) 79bpm  Standing: 112/78 81bpm NuStep 6 minutes level 5 156/94 after nu step 80bpm  Standing toe taps 2" box alternating- CGA no UE assist x20ea Hurdle step overs fwd/back (rail use and CGA) 2x10ea 164/93 82bpm 82bpm Gait in hall x88ft Sidestepping at rail- x1lap Lateral hurdle step overs x10ea (rail use and SBA) 157/96 80bpm  6/27 Taken after nu step Blood pressure baseline 190/90 NuStep 6 minutes level 4  Standing 150/80  Seted hip abuction 3x15  Seated LAQ 3x10 red felt it in his left hip   After 2 earciss B/P take again   170/100. Treatment halted    6/25 Blood pressure baseline 178/88 NuStep 6 minutes level 4  Standing heel raise x20   Narrow base eyes closed 3x30 seconds (light CGA) Modified tandem stance 2x30 seconds ea  Hurdles lateral step over 2 x 10 seated rest break in between sets. Toe taps 2 inch step 2 x 10 each leg with seated rest break in between sets  Airex march x 20  3 pounds weight: Bicep curls 2 x 15 Punches 2 x 15 Double arm press 2 x 15  6/21 Nu step L 4  Standing heel raise x20  Standing marches 2x10 (monitor L hip pain)    PATIENT EDUCATION:  Education details: POC, Symptom management, HEP Person educated: Patient Education method: Explanation, Demonstration, Tactile cues, Verbal cues, and Handouts Education comprehension: verbalized understanding, returned demonstration, verbal cues required, tactile cues required, and needs further education  HOME EXERCISE PROGRAM: Access Code:  [email or web address] URL: [email or web address] Date: 12/02/2022 Prepared by: Lorayne Bender  Exercises - Seated Hip Abduction with Resistance  - 1 x daily - 7 x weekly - 3 sets - 10 reps - Seated Knee Extension with Resistance  - 1 x daily - 7 x weekly - 3 sets - 10 reps - Seated Knee Lifts with Resistance  - 1 x daily - 7 x weekly - 3 sets - 10 reps  ASSESSMENT:  CLINICAL IMPRESSION: BP measured at 111/76 at entry (seated). Retook at 133/86. Standing BP measured at 102/66. This is significantly low for pt and PTA chose to hold on activity. Pt to alert MD if symptoms worsen. He has BP cuff at home.    OBJECTIVE IMPAIRMENTS: decreased activity tolerance, decreased balance, decreased endurance, difficulty walking, decreased strength, and dizziness.   ACTIVITY LIMITATIONS: carrying, lifting, bending, standing, squatting, sleeping, and stairs  PARTICIPATION LIMITATIONS: meal prep, cleaning, laundry, driving, shopping, community activity, and yard work  PERSONAL FACTORS: 3+ comorbidities: type 2 diabetes, orthostatic hypotension, sleep apnea, thoracic aortic anuerysm  are also affecting patient's functional outcome.   REHAB POTENTIAL: Good  CLINICAL DECISION MAKING: Evolving/moderate complexity  EVALUATION COMPLEXITY: Moderate   GOALS: Goals reviewed with patient? Yes  SHORT TERM GOALS: Target date: 5/9 Patient will increase bilateral strength by 5lbs. Baseline: Goal status: Significant improvement in strength overall initial goal which she achieved 6/12 new goal 5 pounds from measurements on 612  2.  Patient will be independent with HEP. Baseline:  Goal status: Performing base HEP at home 6/12  3.  Patient will show minimal fluctuation and syncope with sit to stand transfer  Baseline:  Goal status: Syncope has improved.  Has not major fluctuations in blood pressure 6/12  LONG TERM GOALS: Target date: 01/27/2023    Patient will transfer sit to stand without loss of  balance or syncope  Baseline:  Goal status: MET 6/4  2.  Patient will perform daily activity with no loss of balance  Baseline:  Goal status: IN PROGRESS 6/12 improving but still some baseline balance issues  3.  Patient will be able to walk community distances without any discomfort in left hip or feeling of lost balance .  Baseline:  Goal status: INITIAL      PLAN:  PT FREQUENCY: 1-2x/week  PT DURATION: 10 weeks  PLANNED INTERVENTIONS: Therapeutic exercises, Therapeutic activity, Neuromuscular re-education, Balance training, Gait training, Patient/Family education, Self Care, Joint mobilization, Stair training, Aquatic Therapy, Electrical stimulation, Cryotherapy, Moist heat, Taping, Ultrasound, Ionotophoresis 4mg /ml Dexamethasone, and Manual therapy  PLAN FOR NEXT SESSION: Consider TUG, 5 STS test, 6 minute walk, screen balance; adjust goals review HEP   Riki Altes, PTA   02/22/23

## 2023-02-22 NOTE — Therapy (Deleted)
OUTPATIENT PHYSICAL THERAPY LOWER EXTREMITY Treatment    Patient Name: Jason [name] MRN: [id number] DOB:03-23-1936, 87 y.o., male Today's Date: 02/22/2023  END OF SESSION:         Progress Note Reporting Period 4/18 to 6/11  See note below for Objective Data and Assessment of Progress/Goals.       Past Medical History:  Diagnosis Date   Ascending aortic aneurysm (HCC) 01/22/2022   CAD in native artery 01/22/2022   Cancer [facility]    Cataract    Diabetes mellitus without complication (HCC)    DM (diabetes mellitus) (HCC)    Gait instability 01/22/2022   Hypertension    Sleep apnea    No past surgical history on file. 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   Obstructive sleep apnea of adult 08/11/2022   Diabetic nephropathy with proteinuria (HCC) 08/11/2022   Obesity 08/11/2022   Hyperlipidemia 08/11/2022   Chronic kidney disease, stage 3a (HCC) 08/11/2022   Anxiety disorder, unspecified 08/11/2022   Diabetes mellitus (HCC) 08/11/2022   Primary malignant neoplasm of prostate (HCC) 08/02/2022   Carcinoma of prostate (HCC) 08/02/2022   Shortness of breath 02/22/2022   Pure hypercholesterolemia 02/22/2022   Pain in joint, shoulder region 02/22/2022   Other malaise and fatigue 02/22/2022   Nonexudative age-related macular degeneration, bilateral, intermediate dry stage 02/22/2022   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   Gastroesophageal reflux disease 02/22/2022   Benign paroxysmal positional vertigo 02/22/2022   CAD in native artery 01/22/2022   Thoracic aortic aneurysm, without rupture, unspecified (HCC) 01/22/2022   Gait instability 01/22/2022   Essential hypertension 01/17/2022   Type 2 diabetes mellitus without complications (HCC) 01/17/2022   HLD (hyperlipidemia) 01/17/2022   Obstructive sleep apnea 01/17/2022   Sensorineural hearing loss, bilateral 11/09/2021   Orthostatic hypotension 11/09/2021   Imbalance 11/09/2021    Overweight 02/21/2020   First degree heart block 02/21/2020   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   Malignant tumor of prostate (HCC) 02/21/2020   Vitamin D deficiency 05/31/2016   History of malignant neoplasm of prostate 05/31/2016   History of malignant neoplasm of skin 05/31/2016   Bilateral hearing loss 05/31/2016   Anxiety state 05/31/2016   Bronchospasm 09/24/2010   Cough 09/19/2010   Acute bronchitis 09/19/2010   Allergic rhinitis due to pollen 09/19/2010   Acute sinusitis 09/19/2010    PCP: Meredith Staggers, MD  REFERRING PROVIDER: Meredith Staggers, MD  REFERRING DIAG: R26.81 (ICD-10-CM) - Unsteadiness   THERAPY DIAG:  No diagnosis found.  Rationale for Evaluation and Treatment: Rehabilitation  ONSET DATE: march 2024  SUBJECTIVE:   SUBJECTIVE STATEMENT: Pt reports his hip pain has resolved since injection. Very mild light headedness this morning.   PERTINENT HISTORY: Type 2 diabetes, sleep apnea, thoracic aortic aneurysm, orthorstatic hypotension  PAIN:  Are you having pain? No  PRECAUTIONS: None  WEIGHT BEARING RESTRICTIONS: No  FALLS:  Has patient fallen in last 6 months? Yes. Number of falls 2, step done on a small step. Another one reaching down and fell  LIVING ENVIRONMENT: Lives with: lives with their family Lives in: House/apartment Stairs: No Has following equipment at home: Single point cane  OCCUPATION: retired   PLOF: Independent  PATIENT GOALS: walking without pain, being more stable  NEXT MD VISIT: nothing scheduled   OBJECTIVE:      DIAGNOSTIC FINDINGS: nothing pertinent   PATIENT SURVEYS:  FOTO    COGNITION: Overall cognitive status: Within functional limits for tasks assessed  SENSATION: WFL  EDEMA:   MUSCLE LENGTH:   POSTURE: rounded shoulders  PALPATION:   LOWER EXTREMITY ROM:  Active ROM Right eval Left eval  Hip flexion    Hip extension    Hip abduction    Hip adduction    Hip internal rotation     Hip external rotation    Knee flexion    Knee extension    Ankle dorsiflexion    Ankle plantarflexion    Ankle inversion    Ankle eversion     (Blank rows = not tested)  LOWER EXTREMITY MMT:  MMT Right eval Left eval Right 6/11  Left  6/11  Hip flexion 19.5 19.3 painful 25.0 23.9  Hip extension      Hip abduction 22.5 27.1 42.1 41.2  Hip adduction      Hip internal rotation      Hip external rotation      Knee flexion      Knee extension 27.4 33.2 45.1 42.5  Ankle dorsiflexion      Ankle plantarflexion      Ankle inversion      Ankle eversion       (Blank rows = not tested)  LOWER EXTREMITY SPECIAL TESTS:    5/1: Vitals 139/107mmHg 70bpm  4/26: Vitals: 166/108 at beginning Waited and took again: 168/96 Standing: 133/852 After seated exercise: 181/112 After rest:165/108 At end of session: 167/104  EVAL: Vitals:  Patient Initial blood pressure seated was 165/118.  Patient blood pressure was retaken after a few minutes and was 171/103. Patient blood pressure was also taken standing, 134/78 FUNCTIONAL TESTS:  Eval:TUG test: 16sec- No AD 5xSTS: 27sec (used arms to push up)  6/11  5XSTS 19  TUG 17 without AD   6 min walk test: 2.5 minutes until for seated rest break seated rest break of 2 minutes.  After rest break able to ambulate until 5:30 mark at which time he needed a seated rest break for the rest of the test.  Total distance 527 feet.   GAIT: Patients uses a SPC to ambulate.  Balance:   6/11 Narrow base of support standby assist Narrow base of support eyes closed min assist Tandem stance min assist bilateral Single-leg stance unable to maintain   TODAY'S TREATMENT:                                                                                                                              DATE:   7/2 Prior to session: 138/76 (seated) 79bpm  Standing: 112/78 81bpm NuStep 6 minutes level 5 156/94 after nu step 80bpm  Standing toe  taps 2" box alternating- CGA no UE assist x20ea Hurdle step overs fwd/back (rail use and CGA) 2x10ea 164/93 82bpm 82bpm Gait in hall x69ft Sidestepping at rail- x1lap Lateral hurdle step overs x10ea (rail use and SBA) 157/96 80bpm  6/27 Taken after nu step Blood pressure baseline 190/90 NuStep 6 minutes level 4  Standing 150/80  Seted hip abuction 3x15  Seated LAQ 3x10 red felt it in his left hip   After 2 earciss B/P take again   170/100. Treatment halted    6/25 Blood pressure baseline 178/88 NuStep 6 minutes level 4  Standing heel raise x20   Narrow base eyes closed 3x30 seconds (light CGA) Modified tandem stance 2x30 seconds ea  Hurdles lateral step over 2 x 10 seated rest break in between sets. Toe taps 2 inch step 2 x 10 each leg with seated rest break in between sets  Airex march x 20  3 pounds weight: Bicep curls 2 x 15 Punches 2 x 15 Double arm press 2 x 15  6/21 Nu step L 4  Standing heel raise x20  Standing marches 2x10 (monitor L hip pain)    PATIENT EDUCATION:  Education details: POC, Symptom management, HEP Person educated: Patient Education method: Explanation, Demonstration, Tactile cues, Verbal cues, and Handouts Education comprehension: verbalized understanding, returned demonstration, verbal cues required, tactile cues required, and needs further education  HOME EXERCISE PROGRAM: Access Code: [email or web address] URL: [email or web address] Date: 12/02/2022 Prepared by: Lorayne Bender  Exercises - Seated Hip Abduction with Resistance  - 1 x daily - 7 x weekly - 3 sets - 10 reps - Seated Knee Extension with Resistance  - 1 x daily - 7 x weekly - 3 sets - 10 reps - Seated Knee Lifts with Resistance  - 1 x daily - 7 x weekly - 3 sets - 10 reps  ASSESSMENT:  CLINICAL IMPRESSION: Blood pressure lower than usual today at start of session. Very mild light headedness/dizziness reported after nu-step, this increased slightly after  standing exercise. Pt not limited with exercises today due to hip pain. Denied any hip discomfort throughout session. Monitored BP frequently throughout session. BP did raise with exercise, though not as significantly as usual.    OBJECTIVE IMPAIRMENTS: decreased activity tolerance, decreased balance, decreased endurance, difficulty walking, decreased strength, and dizziness.   ACTIVITY LIMITATIONS: carrying, lifting, bending, standing, squatting, sleeping, and stairs  PARTICIPATION LIMITATIONS: meal prep, cleaning, laundry, driving, shopping, community activity, and yard work  PERSONAL FACTORS: 3+ comorbidities: type 2 diabetes, orthostatic hypotension, sleep apnea, thoracic aortic anuerysm  are also affecting patient's functional outcome.   REHAB POTENTIAL: Good  CLINICAL DECISION MAKING: Evolving/moderate complexity  EVALUATION COMPLEXITY: Moderate   GOALS: Goals reviewed with patient? Yes  SHORT TERM GOALS: Target date: 5/9 Patient will increase bilateral strength by 5lbs. Baseline: Goal status: Significant improvement in strength overall initial goal which she achieved 6/12 new goal 5 pounds from measurements on 612  2.  Patient will be independent with HEP. Baseline:  Goal status: Performing base HEP at home 6/12  3.  Patient will show minimal fluctuation and syncope with sit to stand transfer  Baseline:  Goal status: Syncope has improved.  Has not major fluctuations in blood pressure 6/12  LONG TERM GOALS: Target date: 01/27/2023    Patient will transfer sit to stand without loss of balance or syncope  Baseline:  Goal status: MET 6/4  2.  Patient will perform daily activity with no loss of balance  Baseline:  Goal status: IN PROGRESS 6/12 improving but still some baseline balance issues  3.  Patient will be able to walk community distances without any discomfort in left hip or feeling of lost balance .  Baseline:  Goal status: INITIAL      PLAN:  PT  FREQUENCY: 1-2x/week  PT DURATION: 10 weeks  PLANNED INTERVENTIONS: Therapeutic exercises, Therapeutic activity, Neuromuscular re-education, Balance training, Gait training, Patient/Family education, Self Care, Joint mobilization, Stair training, Aquatic Therapy, Electrical stimulation, Cryotherapy, Moist heat, Taping, Ultrasound, Ionotophoresis 4mg /ml Dexamethasone, and Manual therapy  PLAN FOR NEXT SESSION: Consider TUG, 5 STS test, 6 minute walk, screen balance; adjust goals review HEP   Riki Altes, PTA   02/22/23

## 2023-02-23 DIAGNOSIS — H3530 Unspecified macular degeneration: Secondary | ICD-10-CM | POA: Diagnosis not present

## 2023-02-24 ENCOUNTER — Ambulatory Visit (HOSPITAL_BASED_OUTPATIENT_CLINIC_OR_DEPARTMENT_OTHER): Payer: Medicare HMO

## 2023-02-24 ENCOUNTER — Encounter (HOSPITAL_BASED_OUTPATIENT_CLINIC_OR_DEPARTMENT_OTHER): Payer: Self-pay

## 2023-02-24 DIAGNOSIS — R42 Dizziness and giddiness: Secondary | ICD-10-CM | POA: Diagnosis not present

## 2023-02-24 DIAGNOSIS — R262 Difficulty in walking, not elsewhere classified: Secondary | ICD-10-CM

## 2023-02-24 DIAGNOSIS — R2681 Unsteadiness on feet: Secondary | ICD-10-CM | POA: Diagnosis not present

## 2023-02-24 NOTE — Therapy (Signed)
OUTPATIENT PHYSICAL THERAPY LOWER EXTREMITY Treatment    Patient Name: Jason [name] MRN: [id number] DOB:1936/06/23, 87 y.o., male Today's Date: 02/24/2023  END OF SESSION:  PT End of Session - 02/24/23 1030     Visit Number 17    Number of Visits 26    Date for PT Re-Evaluation 03/23/23    PT Start Time 1016    PT Stop Time 1052    PT Time Calculation (min) 36 min    Activity Tolerance Patient tolerated treatment well    Behavior During Therapy [facility] for tasks assessed/performed                   Progress Note Reporting Period 4/18 to 6/11  See note below for Objective Data and Assessment of Progress/Goals.       Past Medical History:  Diagnosis Date   Ascending aortic aneurysm (HCC) 01/22/2022   CAD in native artery 01/22/2022   Cancer [facility])    Cataract    Diabetes mellitus without complication (HCC)    DM (diabetes mellitus) (HCC)    Gait instability 01/22/2022   Hypertension    Sleep apnea    History reviewed. No pertinent surgical history. 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   Obstructive sleep apnea of adult 08/11/2022   Diabetic nephropathy with proteinuria (HCC) 08/11/2022   Obesity 08/11/2022   Hyperlipidemia 08/11/2022   Chronic kidney disease, stage 3a (HCC) 08/11/2022   Anxiety disorder, unspecified 08/11/2022   Diabetes mellitus (HCC) 08/11/2022   Primary malignant neoplasm of prostate (HCC) 08/02/2022   Carcinoma of prostate (HCC) 08/02/2022   Shortness of breath 02/22/2022   Pure hypercholesterolemia 02/22/2022   Pain in joint, shoulder region 02/22/2022   Other malaise and fatigue 02/22/2022   Nonexudative age-related macular degeneration, bilateral, intermediate dry stage 02/22/2022   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   Gastroesophageal reflux disease 02/22/2022   Benign paroxysmal positional vertigo 02/22/2022   CAD in native artery 01/22/2022   Thoracic aortic aneurysm, without rupture,  unspecified (HCC) 01/22/2022   Gait instability 01/22/2022   Essential hypertension 01/17/2022   Type 2 diabetes mellitus without complications (HCC) 01/17/2022   HLD (hyperlipidemia) 01/17/2022   Obstructive sleep apnea 01/17/2022   Sensorineural hearing loss, bilateral 11/09/2021   Orthostatic hypotension 11/09/2021   Imbalance 11/09/2021   Overweight 02/21/2020   First degree heart block 02/21/2020   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   Malignant tumor of prostate (HCC) 02/21/2020   Vitamin D deficiency 05/31/2016   History of malignant neoplasm of prostate 05/31/2016   History of malignant neoplasm of skin 05/31/2016   Bilateral hearing loss 05/31/2016   Anxiety state 05/31/2016   Bronchospasm 09/24/2010   Cough 09/19/2010   Acute bronchitis 09/19/2010   Allergic rhinitis due to pollen 09/19/2010   Acute sinusitis 09/19/2010    PCP: Meredith Staggers, MD  REFERRING PROVIDER: Meredith Staggers, MD  REFERRING DIAG: R26.81 (ICD-10-CM) - Unsteadiness   THERAPY DIAG:  Dizziness and giddiness  Unsteadiness on feet  Difficulty in walking, not elsewhere classified  Rationale for Evaluation and Treatment: Rehabilitation  ONSET DATE: march 2024  SUBJECTIVE:   SUBJECTIVE STATEMENT: Pt reports   PERTINENT HISTORY: Type 2 diabetes, sleep apnea, thoracic aortic aneurysm, orthorstatic hypotension  PAIN:  Are you having pain? No  PRECAUTIONS: None  WEIGHT BEARING RESTRICTIONS: No  FALLS:  Has patient fallen in last 6 months? Yes. Number of falls 2, step done on a small step. Another one reaching  down and fell  LIVING ENVIRONMENT: Lives with: lives with their family Lives in: House/apartment Stairs: No Has following equipment at home: Single point cane  OCCUPATION: retired   PLOF: Independent  PATIENT GOALS: walking without pain, being more stable  NEXT MD VISIT: nothing scheduled   OBJECTIVE:      DIAGNOSTIC FINDINGS: nothing pertinent   PATIENT SURVEYS:   FOTO    COGNITION: Overall cognitive status: Within functional limits for tasks assessed     SENSATION: WFL  EDEMA:   MUSCLE LENGTH:   POSTURE: rounded shoulders  PALPATION:   LOWER EXTREMITY ROM:  Active ROM Right eval Left eval  Hip flexion    Hip extension    Hip abduction    Hip adduction    Hip internal rotation    Hip external rotation    Knee flexion    Knee extension    Ankle dorsiflexion    Ankle plantarflexion    Ankle inversion    Ankle eversion     (Blank rows = not tested)  LOWER EXTREMITY MMT:  MMT Right eval Left eval Right 6/11  Left  6/11  Hip flexion 19.5 19.3 painful 25.0 23.9  Hip extension      Hip abduction 22.5 27.1 42.1 41.2  Hip adduction      Hip internal rotation      Hip external rotation      Knee flexion      Knee extension 27.4 33.2 45.1 42.5  Ankle dorsiflexion      Ankle plantarflexion      Ankle inversion      Ankle eversion       (Blank rows = not tested)  LOWER EXTREMITY SPECIAL TESTS:    5/1: Vitals 139/61mmHg 70bpm  4/26: Vitals: 166/108 at beginning Waited and took again: 168/96 Standing: 133/852 After seated exercise: 181/112 After rest:165/108 At end of session: 167/104  EVAL: Vitals:  Patient Initial blood pressure seated was 165/118.  Patient blood pressure was retaken after a few minutes and was 171/103. Patient blood pressure was also taken standing, 134/78 FUNCTIONAL TESTS:  Eval:TUG test: 16sec- No AD 5xSTS: 27sec (used arms to push up)  6/11  5XSTS 19  TUG 17 without AD   6 min walk test: 2.5 minutes until for seated rest break seated rest break of 2 minutes.  After rest break able to ambulate until 5:30 mark at which time he needed a seated rest break for the rest of the test.  Total distance 527 feet.   GAIT: Patients uses a SPC to ambulate.  Balance:   6/11 Narrow base of support standby assist Narrow base of support eyes closed min assist Tandem stance min assist  bilateral Single-leg stance unable to maintain   TODAY'S TREATMENT:                                                                                                                              DATE:  7/11 Prior to session blood pressure: 105/65 seated 110/71 standing 118/59 standing  Nu-step L3 BP taken again at 128/81 seated Gait without SPC-36ft (CGA as a precaution) Sidestepping at rail- x2laps   Narrow base eyes closed 3x30 seconds (light CGA) Modified tandem stance 2x30 seconds ea   End of session 164/92 seated  116/72 standing  7/2 Prior to session: 138/76 (seated) 79bpm  Standing: 112/78 81bpm NuStep 6 minutes level 5 156/94 after nu step 80bpm  Standing toe taps 2" box alternating- CGA no UE assist x20ea Hurdle step overs fwd/back (rail use and CGA) 2x10ea 164/93 82bpm 82bpm Gait in hall x53ft Sidestepping at rail- x1lap Lateral hurdle step overs x10ea (rail use and SBA) 157/96 80bpm  6/27 Taken after nu step Blood pressure baseline 190/90 NuStep 6 minutes level 4  Standing 150/80  Seted hip abuction 3x15  Seated LAQ 3x10 red felt it in his left hip   After 2 earciss B/P take again   170/100. Treatment halted    6/25 Blood pressure baseline 178/88 NuStep 6 minutes level 4  Standing heel raise x20   Narrow base eyes closed 3x30 seconds (light CGA) Modified tandem stance 2x30 seconds ea  Hurdles lateral step over 2 x 10 seated rest break in between sets. Toe taps 2 inch step 2 x 10 each leg with seated rest break in between sets  Airex march x 20  3 pounds weight: Bicep curls 2 x 15 Punches 2 x 15 Double arm press 2 x 15  6/21 Nu step L 4  Standing heel raise x20  Standing marches 2x10 (monitor L hip pain)    PATIENT EDUCATION:  Education details: POC, Symptom management, HEP Person educated: Patient Education method: Explanation, Demonstration, Tactile cues, Verbal cues, and Handouts Education  comprehension: verbalized understanding, returned demonstration, verbal cues required, tactile cues required, and needs further education  HOME EXERCISE PROGRAM: Access Code: [email or web address] URL: [email or web address] Date: 12/02/2022 Prepared by: Lorayne Bender  Exercises - Seated Hip Abduction with Resistance  - 1 x daily - 7 x weekly - 3 sets - 10 reps - Seated Knee Extension with Resistance  - 1 x daily - 7 x weekly - 3 sets - 10 reps - Seated Knee Lifts with Resistance  - 1 x daily - 7 x weekly - 3 sets - 10 reps  ASSESSMENT:  CLINICAL IMPRESSION: BP measured at 105/65 74bpm seated, 110/71  77bpm standing at beginning of session. BP tended to increase with exercise. Continued to drop upon standing at end of session. No significant light headedness or dizziness, though mild symptoms present throughout session. Pt was able to ambulate 365ft without SPC with min CGA and no rest breaks. Unable to complete full tandem stance due to balance difficulty, but does well with modified position. Will continue to monitor BP and symptoms with exercise. Pt and his wife will be moving in with daughter by the end of July.    OBJECTIVE IMPAIRMENTS: decreased activity tolerance, decreased balance, decreased endurance, difficulty walking, decreased strength, and dizziness.   ACTIVITY LIMITATIONS: carrying, lifting, bending, standing, squatting, sleeping, and stairs  PARTICIPATION LIMITATIONS: meal prep, cleaning, laundry, driving, shopping, community activity, and yard work  PERSONAL FACTORS: 3+ comorbidities: type 2 diabetes, orthostatic hypotension, sleep apnea, thoracic aortic anuerysm  are also affecting patient's functional outcome.   REHAB POTENTIAL: Good  CLINICAL DECISION MAKING: Evolving/moderate complexity  EVALUATION COMPLEXITY: Moderate   GOALS: Goals reviewed with patient? Yes  SHORT TERM GOALS: Target date: 5/9 Patient  will increase bilateral strength by  5lbs. Baseline: Goal status: Significant improvement in strength overall initial goal which she achieved 6/12 new goal 5 pounds from measurements on 612  2.  Patient will be independent with HEP. Baseline:  Goal status: Performing base HEP at home 6/12  3.  Patient will show minimal fluctuation and syncope with sit to stand transfer  Baseline:  Goal status: Syncope has improved.  Has not major fluctuations in blood pressure 6/12  LONG TERM GOALS: Target date: 01/27/2023    Patient will transfer sit to stand without loss of balance or syncope  Baseline:  Goal status: MET 6/4  2.  Patient will perform daily activity with no loss of balance  Baseline:  Goal status: IN PROGRESS 6/12 improving but still some baseline balance issues  3.  Patient will be able to walk community distances without any discomfort in left hip or feeling of lost balance .  Baseline:  Goal status: INITIAL      PLAN:  PT FREQUENCY: 1-2x/week  PT DURATION: 10 weeks  PLANNED INTERVENTIONS: Therapeutic exercises, Therapeutic activity, Neuromuscular re-education, Balance training, Gait training, Patient/Family education, Self Care, Joint mobilization, Stair training, Aquatic Therapy, Electrical stimulation, Cryotherapy, Moist heat, Taping, Ultrasound, Ionotophoresis 4mg /ml Dexamethasone, and Manual therapy  PLAN FOR NEXT SESSION: Consider TUG, 5 STS test, 6 minute walk, screen balance; adjust goals review HEP   Riki Altes, PTA   02/24/23

## 2023-02-28 ENCOUNTER — Other Ambulatory Visit: Payer: Self-pay | Admitting: Family Medicine

## 2023-02-28 ENCOUNTER — Ambulatory Visit (HOSPITAL_BASED_OUTPATIENT_CLINIC_OR_DEPARTMENT_OTHER): Payer: Medicare HMO

## 2023-02-28 ENCOUNTER — Encounter (HOSPITAL_BASED_OUTPATIENT_CLINIC_OR_DEPARTMENT_OTHER): Payer: Self-pay

## 2023-02-28 DIAGNOSIS — R2681 Unsteadiness on feet: Secondary | ICD-10-CM | POA: Diagnosis not present

## 2023-02-28 DIAGNOSIS — R42 Dizziness and giddiness: Secondary | ICD-10-CM

## 2023-02-28 DIAGNOSIS — E1169 Type 2 diabetes mellitus with other specified complication: Secondary | ICD-10-CM

## 2023-02-28 DIAGNOSIS — R262 Difficulty in walking, not elsewhere classified: Secondary | ICD-10-CM | POA: Diagnosis not present

## 2023-02-28 NOTE — Therapy (Signed)
OUTPATIENT PHYSICAL THERAPY LOWER EXTREMITY Treatment    Patient Name: Jason [name] MRN: [id number] DOB:[birth year], 87 y.o., male Today's Date: 02/28/2023  END OF SESSION:  PT End of Session - 02/28/23 1109     Visit Number 18    Number of Visits 26    Date for PT Re-Evaluation 03/23/23    PT Start Time 1106    PT Stop Time 1145    PT Time Calculation (min) 39 min    Equipment Utilized During Treatment Gait belt    Activity Tolerance Patient tolerated treatment well    Behavior During Therapy WFL for tasks assessed/performed               Past Medical History:  Diagnosis Date   Ascending aortic aneurysm (HCC) 01/22/2022   CAD in native artery 01/22/2022   Cancer (HCC)    Cataract    Diabetes mellitus without complication (HCC)    DM (diabetes mellitus) (HCC)    Gait instability 01/22/2022   Hypertension    Sleep apnea    History reviewed. No pertinent surgical history. 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   Obstructive sleep apnea of adult 08/11/2022   Diabetic nephropathy with proteinuria (HCC) 08/11/2022   Obesity 08/11/2022   Hyperlipidemia 08/11/2022   Chronic kidney disease, stage 3a (HCC) 08/11/2022   Anxiety disorder, unspecified 08/11/2022   Diabetes mellitus (HCC) 08/11/2022   Primary malignant neoplasm of prostate (HCC) 08/02/2022   Carcinoma of prostate (HCC) 08/02/2022   Shortness of breath 02/22/2022   Pure hypercholesterolemia 02/22/2022   Pain in joint, shoulder region 02/22/2022   Other malaise and fatigue 02/22/2022   Nonexudative age-related macular degeneration, bilateral, intermediate dry stage 02/22/2022   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   Gastroesophageal reflux disease 02/22/2022   Benign paroxysmal positional vertigo 02/22/2022   CAD in native artery 01/22/2022   Thoracic aortic aneurysm, without rupture, unspecified (HCC) 01/22/2022   Gait instability 01/22/2022   Essential hypertension  01/17/2022   Type 2 diabetes mellitus without complications (HCC) 01/17/2022   HLD (hyperlipidemia) 01/17/2022   Obstructive sleep apnea 01/17/2022   Sensorineural hearing loss, bilateral 11/09/2021   Orthostatic hypotension 11/09/2021   Imbalance 11/09/2021   Overweight 02/21/2020   First degree heart block 02/21/2020   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   Malignant tumor of prostate (HCC) 02/21/2020   Vitamin D deficiency 05/31/2016   History of malignant neoplasm of prostate 05/31/2016   History of malignant neoplasm of skin 05/31/2016   Bilateral hearing loss 05/31/2016   Anxiety state 05/31/2016   Bronchospasm 09/24/2010   Cough 09/19/2010   Acute bronchitis 09/19/2010   Allergic rhinitis due to pollen 09/19/2010   Acute sinusitis 09/19/2010    PCP: Meredith Staggers, MD  REFERRING PROVIDER: Meredith Staggers, MD  REFERRING DIAG: R26.81 (ICD-10-CM) - Unsteadiness   THERAPY DIAG:  Unsteadiness on feet  Dizziness and giddiness  Difficulty in walking, not elsewhere classified  Rationale for Evaluation and Treatment: Rehabilitation  ONSET DATE: march 2024  SUBJECTIVE:   SUBJECTIVE STATEMENT: Pt reports   PERTINENT HISTORY: Type 2 diabetes, sleep apnea, thoracic aortic aneurysm, orthorstatic hypotension  PAIN:  Are you having pain? No  PRECAUTIONS: None  WEIGHT BEARING RESTRICTIONS: No  FALLS:  Has patient fallen in last 6 months? Yes. Number of falls 2, step done on a small step. Another one reaching down and fell  LIVING ENVIRONMENT: Lives with: lives with their family Lives in: House/apartment Stairs: No Has following  equipment at home: Single point cane  OCCUPATION: retired   PLOF: Independent  PATIENT GOALS: walking without pain, being more stable  NEXT MD VISIT: nothing scheduled   OBJECTIVE:      DIAGNOSTIC FINDINGS: nothing pertinent   PATIENT SURVEYS:  FOTO    COGNITION: Overall cognitive status: Within functional limits for tasks  assessed     SENSATION: WFL  EDEMA:   MUSCLE LENGTH:   POSTURE: rounded shoulders  PALPATION:   LOWER EXTREMITY ROM:  Active ROM Right eval Left eval  Hip flexion    Hip extension    Hip abduction    Hip adduction    Hip internal rotation    Hip external rotation    Knee flexion    Knee extension    Ankle dorsiflexion    Ankle plantarflexion    Ankle inversion    Ankle eversion     (Blank rows = not tested)  LOWER EXTREMITY MMT:  MMT Right eval Left eval Right 6/11  Left  6/11  Hip flexion 19.5 19.3 painful 25.0 23.9  Hip extension      Hip abduction 22.5 27.1 42.1 41.2  Hip adduction      Hip internal rotation      Hip external rotation      Knee flexion      Knee extension 27.4 33.2 45.1 42.5  Ankle dorsiflexion      Ankle plantarflexion      Ankle inversion      Ankle eversion       (Blank rows = not tested)  LOWER EXTREMITY SPECIAL TESTS:    5/1: Vitals 139/53mmHg 70bpm  4/26: Vitals: 166/108 at beginning Waited and took again: 168/96 Standing: 133/852 After seated exercise: 181/112 After rest:165/108 At end of session: 167/104  EVAL: Vitals:  Patient Initial blood pressure seated was 165/118.  Patient blood pressure was retaken after a few minutes and was 171/103. Patient blood pressure was also taken standing, 134/78 FUNCTIONAL TESTS:  Eval:TUG test: 16sec- No AD 5xSTS: 27sec (used arms to push up)  6/11  5XSTS 19  TUG 17 without AD   6 min walk test: 2.5 minutes until for seated rest break seated rest break of 2 minutes.  After rest break able to ambulate until 5:30 mark at which time he needed a seated rest break for the rest of the test.  Total distance 527 feet.   GAIT: Patients uses a SPC to ambulate.  Balance:   6/11 Narrow base of support standby assist Narrow base of support eyes closed min assist Tandem stance min assist bilateral Single-leg stance unable to maintain   TODAY'S TREATMENT:                                                                                                                               DATE:    7/15: Start of session: 130/74 (seated) 120/78 (standing)  Nu-step L5 6 min  157/92 67bpm  Waited then took again at 144/92  FT EC 30sec x2 Tandem balance 30seconds ea Marching in hall- 1/2 hall x2 Sit to stands without hands x10  167/99     7/11 Prior to session blood pressure: 105/65 seated 110/71 standing 118/59 standing  Nu-step L3 BP taken again at 128/81 seated Gait without SPC-328ft (CGA as a precaution) Sidestepping at rail- x2laps   Narrow base eyes closed 3x30 seconds (light CGA) Modified tandem stance 2x30 seconds ea   End of session 164/92 seated  116/72 standing  7/2 Prior to session: 138/76 (seated) 79bpm  Standing: 112/78 81bpm NuStep 6 minutes level 5 156/94 after nu step 80bpm  Standing toe taps 2" box alternating- CGA no UE assist x20ea Hurdle step overs fwd/back (rail use and CGA) 2x10ea 164/93 82bpm 82bpm Gait in hall x25ft Sidestepping at rail- x1lap Lateral hurdle step overs x10ea (rail use and SBA) 157/96 80bpm   PATIENT EDUCATION:  Education details: POC, Symptom management, HEP Person educated: Patient Education method: Explanation, Demonstration, Tactile cues, Verbal cues, and Handouts Education comprehension: verbalized understanding, returned demonstration, verbal cues required, tactile cues required, and needs further education  HOME EXERCISE PROGRAM: Access Code: [email or web address] URL: [email or web address] Date: 12/02/2022 Prepared by: Lorayne Bender  Exercises - Seated Hip Abduction with Resistance  - 1 x daily - 7 x weekly - 3 sets - 10 reps - Seated Knee Extension with Resistance  - 1 x daily - 7 x weekly - 3 sets - 10 reps - Seated Knee Lifts with Resistance  - 1 x daily - 7 x weekly - 3 sets - 10 reps  ASSESSMENT:  CLINICAL IMPRESSION: Pt demonstrated lower blood pressure at  start of session. BP does increase with activity, but not as significantly as previous weeks. He demonstrates significant improvement in ability for balance challenges. Minimal to no CGA required with static and dynamic balance tasks.  Pt and his wife will be moving in with daughter by the end of July.    OBJECTIVE IMPAIRMENTS: decreased activity tolerance, decreased balance, decreased endurance, difficulty walking, decreased strength, and dizziness.   ACTIVITY LIMITATIONS: carrying, lifting, bending, standing, squatting, sleeping, and stairs  PARTICIPATION LIMITATIONS: meal prep, cleaning, laundry, driving, shopping, community activity, and yard work  PERSONAL FACTORS: 3+ comorbidities: type 2 diabetes, orthostatic hypotension, sleep apnea, thoracic aortic anuerysm  are also affecting patient's functional outcome.   REHAB POTENTIAL: Good  CLINICAL DECISION MAKING: Evolving/moderate complexity  EVALUATION COMPLEXITY: Moderate   GOALS: Goals reviewed with patient? Yes  SHORT TERM GOALS: Target date: 5/9 Patient will increase bilateral strength by 5lbs. Baseline: Goal status: Significant improvement in strength overall initial goal which she achieved 6/12 new goal 5 pounds from measurements on 612  2.  Patient will be independent with HEP. Baseline:  Goal status: Performing base HEP at home 6/12  3.  Patient will show minimal fluctuation and syncope with sit to stand transfer  Baseline:  Goal status: Syncope has improved.  Has not major fluctuations in blood pressure 6/12  LONG TERM GOALS: Target date: 01/27/2023    Patient will transfer sit to stand without loss of balance or syncope  Baseline:  Goal status: MET 6/4  2.  Patient will perform daily activity with no loss of balance  Baseline:  Goal status: IN PROGRESS 6/12 improving but still some baseline balance issues  3.  Patient will be able to walk community distances without any discomfort in left hip or feeling of  lost balance .  Baseline:  Goal status: INITIAL      PLAN:  PT FREQUENCY: 1-2x/week  PT DURATION: 10 weeks  PLANNED INTERVENTIONS: Therapeutic exercises, Therapeutic activity, Neuromuscular re-education, Balance training, Gait training, Patient/Family education, Self Care, Joint mobilization, Stair training, Aquatic Therapy, Electrical stimulation, Cryotherapy, Moist heat, Taping, Ultrasound, Ionotophoresis 4mg /ml Dexamethasone, and Manual therapy  PLAN FOR NEXT SESSION: Consider TUG, 5 STS test, 6 minute walk, screen balance; adjust goals review HEP   Riki Altes, PTA   02/28/23

## 2023-03-03 ENCOUNTER — Encounter (HOSPITAL_BASED_OUTPATIENT_CLINIC_OR_DEPARTMENT_OTHER): Payer: Self-pay

## 2023-03-03 ENCOUNTER — Ambulatory Visit (HOSPITAL_BASED_OUTPATIENT_CLINIC_OR_DEPARTMENT_OTHER): Payer: Medicare HMO

## 2023-03-03 DIAGNOSIS — R42 Dizziness and giddiness: Secondary | ICD-10-CM

## 2023-03-03 DIAGNOSIS — R262 Difficulty in walking, not elsewhere classified: Secondary | ICD-10-CM

## 2023-03-03 DIAGNOSIS — R2681 Unsteadiness on feet: Secondary | ICD-10-CM

## 2023-03-03 NOTE — Therapy (Signed)
OUTPATIENT PHYSICAL THERAPY LOWER EXTREMITY Treatment    Patient Name: Jason [name] MRN: [id number] DOB:03-Apr-1936, 87 y.o., male Today's Date: 03/03/2023  END OF SESSION:      Past Medical History:  Diagnosis Date   Ascending aortic aneurysm (HCC) 01/22/2022   CAD in native artery 01/22/2022   Cancer (HCC)    Cataract    Diabetes mellitus without complication (HCC)    DM (diabetes mellitus) (HCC)    Gait instability 01/22/2022   Hypertension    Sleep apnea    No past surgical history on file. 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   Obstructive sleep apnea of adult 08/11/2022   Diabetic nephropathy with proteinuria (HCC) 08/11/2022   Obesity 08/11/2022   Hyperlipidemia 08/11/2022   Chronic kidney disease, stage 3a (HCC) 08/11/2022   Anxiety disorder, unspecified 08/11/2022   Diabetes mellitus (HCC) 08/11/2022   Primary malignant neoplasm of prostate (HCC) 08/02/2022   Carcinoma of prostate (HCC) 08/02/2022   Shortness of breath 02/22/2022   Pure hypercholesterolemia 02/22/2022   Pain in joint, shoulder region 02/22/2022   Other malaise and fatigue 02/22/2022   Nonexudative age-related macular degeneration, bilateral, intermediate dry stage 02/22/2022   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   Gastroesophageal reflux disease 02/22/2022   Benign paroxysmal positional vertigo 02/22/2022   CAD in native artery 01/22/2022   Thoracic aortic aneurysm, without rupture, unspecified (HCC) 01/22/2022   Gait instability 01/22/2022   Essential hypertension 01/17/2022   Type 2 diabetes mellitus without complications (HCC) 01/17/2022   HLD (hyperlipidemia) 01/17/2022   Obstructive sleep apnea 01/17/2022   Sensorineural hearing loss, bilateral 11/09/2021   Orthostatic hypotension 11/09/2021   Imbalance 11/09/2021   Overweight 02/21/2020   First degree heart block 02/21/2020   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   Malignant tumor of prostate (HCC)  02/21/2020   Vitamin D deficiency 05/31/2016   History of malignant neoplasm of prostate 05/31/2016   History of malignant neoplasm of skin 05/31/2016   Bilateral hearing loss 05/31/2016   Anxiety state 05/31/2016   Bronchospasm 09/24/2010   Cough 09/19/2010   Acute bronchitis 09/19/2010   Allergic rhinitis due to pollen 09/19/2010   Acute sinusitis 09/19/2010    PCP: Meredith Staggers, MD  REFERRING PROVIDER: Meredith Staggers, MD  REFERRING DIAG: R26.81 (ICD-10-CM) - Unsteadiness   THERAPY DIAG:  No diagnosis found.  Rationale for Evaluation and Treatment: Rehabilitation  ONSET DATE: march 2024  SUBJECTIVE:   SUBJECTIVE STATEMENT: Pt reports "I don't feel well today." He states he has tired with preparing for his move. Did not sleep well last night. No light headedness or dizziness at entry. "I feel tired today."  PERTINENT HISTORY: Type 2 diabetes, sleep apnea, thoracic aortic aneurysm, orthorstatic hypotension  PAIN:  Are you having pain? No  PRECAUTIONS: None  WEIGHT BEARING RESTRICTIONS: No  FALLS:  Has patient fallen in last 6 months? Yes. Number of falls 2, step done on a small step. Another one reaching down and fell  LIVING ENVIRONMENT: Lives with: lives with their family Lives in: House/apartment Stairs: No Has following equipment at home: Single point cane  OCCUPATION: retired   PLOF: Independent  PATIENT GOALS: walking without pain, being more stable  NEXT MD VISIT: nothing scheduled   OBJECTIVE:      DIAGNOSTIC FINDINGS: nothing pertinent   PATIENT SURVEYS:  FOTO    COGNITION: Overall cognitive status: Within functional limits for tasks assessed     SENSATION: WFL  EDEMA:   MUSCLE  LENGTH:   POSTURE: rounded shoulders  PALPATION:   LOWER EXTREMITY ROM:  Active ROM Right eval Left eval  Hip flexion    Hip extension    Hip abduction    Hip adduction    Hip internal rotation    Hip external rotation    Knee flexion     Knee extension    Ankle dorsiflexion    Ankle plantarflexion    Ankle inversion    Ankle eversion     (Blank rows = not tested)  LOWER EXTREMITY MMT:  MMT Right eval Left eval Right 6/11  Left  6/11  Hip flexion 19.5 19.3 painful 25.0 23.9  Hip extension      Hip abduction 22.5 27.1 42.1 41.2  Hip adduction      Hip internal rotation      Hip external rotation      Knee flexion      Knee extension 27.4 33.2 45.1 42.5  Ankle dorsiflexion      Ankle plantarflexion      Ankle inversion      Ankle eversion       (Blank rows = not tested)  LOWER EXTREMITY SPECIAL TESTS:    5/1: Vitals 139/93mmHg 70bpm  4/26: Vitals: 166/108 at beginning Waited and took again: 168/96 Standing: 133/852 After seated exercise: 181/112 After rest:165/108 At end of session: 167/104  EVAL: Vitals:  Patient Initial blood pressure seated was 165/118.  Patient blood pressure was retaken after a few minutes and was 171/103. Patient blood pressure was also taken standing, 134/78 FUNCTIONAL TESTS:  Eval:TUG test: 16sec- No AD 5xSTS: 27sec (used arms to push up)  6/11  5XSTS 19  TUG 17 without AD   6 min walk test: 2.5 minutes until for seated rest break seated rest break of 2 minutes.  After rest break able to ambulate until 5:30 mark at which time he needed a seated rest break for the rest of the test.  Total distance 527 feet.   GAIT: Patients uses a SPC to ambulate.  Balance:   6/11 Narrow base of support standby assist Narrow base of support eyes closed min assist Tandem stance min assist bilateral Single-leg stance unable to maintain   TODAY'S TREATMENT:                                                                                                                              DATE:   7/18: Start of session: 138/101 (seated) 95/76 (standing) Held of PT today due to drastic change in BP.    7/15: Start of session: 130/74 (seated) 120/78 (standing)  Nu-step L5 6  min  157/92 67bpm Waited then took again at 144/92  FT EC 30sec x2 Tandem balance 30seconds ea Marching in hall- 1/2 hall x2 Sit to stands without hands x10  167/99     7/11 Prior to session blood pressure: 105/65 seated 110/71 standing 118/59 standing  Nu-step L3 BP  taken again at 128/81 seated Gait without SPC-31ft (CGA as a precaution) Sidestepping at rail- x2laps   Narrow base eyes closed 3x30 seconds (light CGA) Modified tandem stance 2x30 seconds ea   End of session 164/92 seated  116/72 standing  7/2 Prior to session: 138/76 (seated) 79bpm  Standing: 112/78 81bpm NuStep 6 minutes level 5 156/94 after nu step 80bpm  Standing toe taps 2" box alternating- CGA no UE assist x20ea Hurdle step overs fwd/back (rail use and CGA) 2x10ea 164/93 82bpm 82bpm Gait in hall x79ft Sidestepping at rail- x1lap Lateral hurdle step overs x10ea (rail use and SBA) 157/96 80bpm   PATIENT EDUCATION:  Education details: POC, Symptom management, HEP Person educated: Patient Education method: Explanation, Demonstration, Tactile cues, Verbal cues, and Handouts Education comprehension: verbalized understanding, returned demonstration, verbal cues required, tactile cues required, and needs further education  HOME EXERCISE PROGRAM: Access Code: [email or web address] URL: [email or web address] Date: 12/02/2022 Prepared by: Lorayne Bender  Exercises - Seated Hip Abduction with Resistance  - 1 x daily - 7 x weekly - 3 sets - 10 reps - Seated Knee Extension with Resistance  - 1 x daily - 7 x weekly - 3 sets - 10 reps - Seated Knee Lifts with Resistance  - 1 x daily - 7 x weekly - 3 sets - 10 reps  ASSESSMENT:  CLINICAL IMPRESSION: Pt not seen today due to BP. See above for values. Recommended pt notify MD if BP continues to fluctuate so drastically.    OBJECTIVE IMPAIRMENTS: decreased activity tolerance, decreased balance, decreased endurance, difficulty  walking, decreased strength, and dizziness.   ACTIVITY LIMITATIONS: carrying, lifting, bending, standing, squatting, sleeping, and stairs  PARTICIPATION LIMITATIONS: meal prep, cleaning, laundry, driving, shopping, community activity, and yard work  PERSONAL FACTORS: 3+ comorbidities: type 2 diabetes, orthostatic hypotension, sleep apnea, thoracic aortic anuerysm  are also affecting patient's functional outcome.   REHAB POTENTIAL: Good  CLINICAL DECISION MAKING: Evolving/moderate complexity  EVALUATION COMPLEXITY: Moderate   GOALS: Goals reviewed with patient? Yes  SHORT TERM GOALS: Target date: 5/9 Patient will increase bilateral strength by 5lbs. Baseline: Goal status: Significant improvement in strength overall initial goal which she achieved 6/12 new goal 5 pounds from measurements on 612  2.  Patient will be independent with HEP. Baseline:  Goal status: Performing base HEP at home 6/12  3.  Patient will show minimal fluctuation and syncope with sit to stand transfer  Baseline:  Goal status: Syncope has improved.  Has not major fluctuations in blood pressure 6/12  LONG TERM GOALS: Target date: 01/27/2023    Patient will transfer sit to stand without loss of balance or syncope  Baseline:  Goal status: MET 6/4  2.  Patient will perform daily activity with no loss of balance  Baseline:  Goal status: IN PROGRESS 6/12 improving but still some baseline balance issues  3.  Patient will be able to walk community distances without any discomfort in left hip or feeling of lost balance .  Baseline:  Goal status: INITIAL      PLAN:  PT FREQUENCY: 1-2x/week  PT DURATION: 10 weeks  PLANNED INTERVENTIONS: Therapeutic exercises, Therapeutic activity, Neuromuscular re-education, Balance training, Gait training, Patient/Family education, Self Care, Joint mobilization, Stair training, Aquatic Therapy, Electrical stimulation, Cryotherapy, Moist heat, Taping, Ultrasound,  Ionotophoresis 4mg /ml Dexamethasone, and Manual therapy  PLAN FOR NEXT SESSION: Consider TUG, 5 STS test, 6 minute walk, screen balance; adjust goals review HEP   Riki Altes, PTA   03/03/23

## 2023-03-08 ENCOUNTER — Ambulatory Visit (HOSPITAL_BASED_OUTPATIENT_CLINIC_OR_DEPARTMENT_OTHER): Payer: Medicare HMO | Admitting: Physical Therapy

## 2023-03-08 DIAGNOSIS — R262 Difficulty in walking, not elsewhere classified: Secondary | ICD-10-CM | POA: Diagnosis not present

## 2023-03-08 DIAGNOSIS — R2681 Unsteadiness on feet: Secondary | ICD-10-CM

## 2023-03-08 DIAGNOSIS — R42 Dizziness and giddiness: Secondary | ICD-10-CM | POA: Diagnosis not present

## 2023-03-08 NOTE — Therapy (Addendum)
OUTPATIENT PHYSICAL THERAPY LOWER EXTREMITY Treatment / progress note    Patient Name: Jason [name] MRN: [id number] DOB:09/03/1935, 87 y.o., male Today's Date: 03/09/2023  END OF SESSION:  PT End of Session - 03/08/23 0933     Visit Number 19    Number of Visits 26    Date for PT Re-Evaluation 05/04/23    PT Start Time 0930    PT Stop Time 1012    PT Time Calculation (min) 42 min    Activity Tolerance Patient tolerated treatment well    Behavior During Therapy [facility] for tasks assessed/performed                Past Medical History:  Diagnosis Date   Ascending aortic aneurysm (HCC) 01/22/2022   CAD in native artery 01/22/2022   Cancer (HCC)    Cataract    Diabetes mellitus without complication (HCC)    DM (diabetes mellitus) (HCC)    Gait instability 01/22/2022   Hypertension    Sleep apnea    History reviewed. No pertinent surgical history. 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   Obstructive sleep apnea of adult 08/11/2022   Diabetic nephropathy with proteinuria (HCC) 08/11/2022   Obesity 08/11/2022   Hyperlipidemia 08/11/2022   Chronic kidney disease, stage 3a (HCC) 08/11/2022   Anxiety disorder, unspecified 08/11/2022   Diabetes mellitus (HCC) 08/11/2022   Primary malignant neoplasm of prostate (HCC) 08/02/2022   Carcinoma of prostate (HCC) 08/02/2022   Shortness of breath 02/22/2022   Pure hypercholesterolemia 02/22/2022   Pain in joint, shoulder region 02/22/2022   Other malaise and fatigue 02/22/2022   Nonexudative age-related macular degeneration, bilateral, intermediate dry stage 02/22/2022   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   Gastroesophageal reflux disease 02/22/2022   Benign paroxysmal positional vertigo 02/22/2022   CAD in native artery 01/22/2022   Thoracic aortic aneurysm, without rupture, unspecified (HCC) 01/22/2022   Gait instability 01/22/2022   Essential hypertension 01/17/2022   Type 2 diabetes  mellitus without complications (HCC) 01/17/2022   HLD (hyperlipidemia) 01/17/2022   Obstructive sleep apnea 01/17/2022   Sensorineural hearing loss, bilateral 11/09/2021   Orthostatic hypotension 11/09/2021   Imbalance 11/09/2021   Overweight 02/21/2020   First degree heart block 02/21/2020   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   Malignant tumor of prostate (HCC) 02/21/2020   Vitamin D deficiency 05/31/2016   History of malignant neoplasm of prostate 05/31/2016   History of malignant neoplasm of skin 05/31/2016   Bilateral hearing loss 05/31/2016   Anxiety state 05/31/2016   Bronchospasm 09/24/2010   Cough 09/19/2010   Acute bronchitis 09/19/2010   Allergic rhinitis due to pollen 09/19/2010   Acute sinusitis 09/19/2010   Progress Note Reporting Period 01/25/2023 to 03/08/2023  See note below for Objective Data and Assessment of Progress/Goals.      PCP: Meredith Staggers, MD  REFERRING PROVIDER: Meredith Staggers, MD  REFERRING DIAG: R26.81 (ICD-10-CM) - Unsteadiness   THERAPY DIAG:  Unsteadiness on feet  Dizziness and giddiness  Difficulty in walking, not elsewhere classified  Rationale for Evaluation and Treatment: Rehabilitation  ONSET DATE: march 2024  SUBJECTIVE:   SUBJECTIVE STATEMENT: Patient is feeling better today.  He feels like his blood pressure is more under control.  Overall he feels like he is improving.  He feels an improved ability to perform his ADLs.  He feels more steady in general. PERTINENT HISTORY: Type 2 diabetes, sleep apnea, thoracic aortic aneurysm, orthorstatic hypotension  PAIN:  Are you  having pain? No  PRECAUTIONS: None  WEIGHT BEARING RESTRICTIONS: No  FALLS:  Has patient fallen in last 6 months? Yes. Number of falls 2, step done on a small step. Another one reaching down and fell  LIVING ENVIRONMENT: Lives with: lives with their family Lives in: House/apartment Stairs: No Has following equipment at home: Single point  cane  OCCUPATION: retired   PLOF: Independent  PATIENT GOALS: walking without pain, being more stable  NEXT MD VISIT: nothing scheduled   OBJECTIVE:      DIAGNOSTIC FINDINGS: nothing pertinent   PATIENT SURVEYS:  FOTO    COGNITION: Overall cognitive status: Within functional limits for tasks assessed     SENSATION: WFL  EDEMA:   MUSCLE LENGTH:   POSTURE: rounded shoulders  PALPATION:   LOWER EXTREMITY ROM:  Active ROM Right eval Left eval  Hip flexion    Hip extension    Hip abduction    Hip adduction    Hip internal rotation    Hip external rotation    Knee flexion    Knee extension    Ankle dorsiflexion    Ankle plantarflexion    Ankle inversion    Ankle eversion     (Blank rows = not tested)  LOWER EXTREMITY MMT:  MMT Right eval Left eval Right 6/11  Left  6/11 Right 7/23 Left 7/23  Hip flexion 19.5 19.3 painful 25.0 23.9 32.4 26.3  Hip extension        Hip abduction 22.5 27.1 42.1 41.2 46. 33.5  Hip adduction        Hip internal rotation        Hip external rotation        Knee flexion        Knee extension 27.4 33.2 45.1 42.5 45.4 52.3  Ankle dorsiflexion        Ankle plantarflexion        Ankle inversion        Ankle eversion         (Blank rows = not tested)  LOWER EXTREMITY SPECIAL TESTS:    5/1: Vitals 139/59mmHg 70bpm  4/26: Vitals: 166/108 at beginning Waited and took again: 168/96 Standing: 133/852 After seated exercise: 181/112 After rest:165/108 At end of session: 167/104  EVAL: Vitals:  Patient Initial blood pressure seated was 165/118.  Patient blood pressure was retaken after a few minutes and was 171/103. Patient blood pressure was also taken standing, 134/78 FUNCTIONAL TESTS:  Eval:TUG test: 16sec- No AD 5xSTS: 27sec (used arms to push up)  6/11  5XSTS 19  TUG 17 without AD   6 min walk test: 2.5 minutes until for seated rest break seated rest break of 2 minutes.  After rest break able to  ambulate until 5:30 mark at which time he needed a seated rest break for the rest of the test.  Total distance 527 feet.  7/23 5x STS 13  6 min walk test 875 no rest break  After walk test 185/105      GAIT: Patients uses a SPC to ambulate.  Balance:   6/11 Narrow base of support standby assist Narrow base of support eyes closed min assist Tandem stance min assist bilateral Single-leg stance unable to maintain  7/23 Narrow base of support standby assist Narrow base of support eyes closed min assist Tandem stance min assist bilateral Single-leg stance unable to maintain   TODAY'S TREATMENT:  DATE:  7/23 Vitals :   168/89   132/75  Testing for gait distance/ strength/ and balance   See objective for testing measures Reviewed HEP d    7/18: Start of session: 138/101 (seated) 95/76 (standing) Held of PT today due to drastic change in BP.    7/15: Start of session: 130/74 (seated) 120/78 (standing)  Nu-step L5 6 min  157/92 67bpm Waited then took again at 144/92  FT EC 30sec x2 Tandem balance 30seconds ea Marching in hall- 1/2 hall x2 Sit to stands without hands x10  167/99     7/11 Prior to session blood pressure: 105/65 seated 110/71 standing 118/59 standing  Nu-step L3 BP taken again at 128/81 seated Gait without SPC-357ft (CGA as a precaution) Sidestepping at rail- x2laps   Narrow base eyes closed 3x30 seconds (light CGA) Modified tandem stance 2x30 seconds ea   End of session 164/92 seated  116/72 standing  7/2 Prior to session: 138/76 (seated) 79bpm  Standing: 112/78 81bpm NuStep 6 minutes level 5 156/94 after nu step 80bpm  Standing toe taps 2" box alternating- CGA no UE assist x20ea Hurdle step overs fwd/back (rail use and CGA) 2x10ea 164/93 82bpm 82bpm Gait in hall  x12ft Sidestepping at rail- x1lap Lateral hurdle step overs x10ea (rail use and SBA) 157/96 80bpm   PATIENT EDUCATION:  Education details: POC, Symptom management, HEP Person educated: Patient Education method: Explanation, Demonstration, Tactile cues, Verbal cues, and Handouts Education comprehension: verbalized understanding, returned demonstration, verbal cues required, tactile cues required, and needs further education  HOME EXERCISE PROGRAM: Access Code: [email or web address] URL: [email or web address] Date: 12/02/2022 Prepared by: Lorayne Bender  Exercises - Seated Hip Abduction with Resistance  - 1 x daily - 7 x weekly - 3 sets - 10 reps - Seated Knee Extension with Resistance  - 1 x daily - 7 x weekly - 3 sets - 10 reps - Seated Knee Lifts with Resistance  - 1 x daily - 7 x weekly - 3 sets - 10 reps  ASSESSMENT:  CLINICAL IMPRESSION: The patient is progressing very well.  He increase his 6-minute walk test time decreased his 5 times sit to stand test time, and increase strength in all muscle groups measured.  He feels like overall he is more stable at home.  He is walking longer distances.  He has had less difficulty with his ADLs.  He has has not had any falls in the past few weeks.  His blood pressure continues to fluctuate significantly with position change but he is less symptomatic with these fluctuations.  His blood pressure was elevated after his 6-minute walk test.  He remained asymptomatic despite fluctuation in blood pressure.  The patient would benefit from continued skilled therapy 2W8.  Over this time.  We will continue to develop his balance and strengthening program.  We will progress him towards a home program either in his house or at the gym.  Will discuss with him which option makes the most sense.  See below for goal specific progress   OBJECTIVE IMPAIRMENTS: decreased activity tolerance, decreased balance, decreased endurance, difficulty walking, decreased  strength, and dizziness.   ACTIVITY LIMITATIONS: carrying, lifting, bending, standing, squatting, sleeping, and stairs  PARTICIPATION LIMITATIONS: meal prep, cleaning, laundry, driving, shopping, community activity, and yard work  PERSONAL FACTORS: 3+ comorbidities: type 2 diabetes, orthostatic hypotension, sleep apnea, thoracic aortic anuerysm  are also affecting patient's functional outcome.   REHAB POTENTIAL: Good  CLINICAL DECISION MAKING: Evolving/moderate complexity  EVALUATION  COMPLEXITY: Moderate   GOALS: Goals reviewed with patient? Yes  SHORT TERM GOALS: Target date: 5/9 Patient will increase bilateral strength by 5lbs. Baseline: Goal status: Significant improvement in strength overall initial goal which she achieved 7/23 new goal 5 pounds from measurements on 7/23  2.  Patient will be independent with HEP. Baseline:  Goal status: Performing base HEP at home 6/12  3.  Patient will show minimal fluctuation and syncope with sit to stand transfer  Baseline:  Goal status: Minimal syncope.  Continues to have fluctuations in blood pressure  LONG TERM GOALS: Target date: 01/27/2023    Patient will transfer sit to stand without loss of balance or syncope  Baseline:  Goal status: MET 6/4  2.  Patient will perform daily activity with no loss of balance  Baseline:  Goal status: IN PROGRESS 6/12 improving but still some baseline balance issues goal continues to improve 7/23  3.  Patient will be able to walk community distances without any discomfort in left hip or feeling of lost balance .  Baseline:  Goal status: Mild pain in his hip but improving ability to ambulate in the community 7/23      PLAN:  PT FREQUENCY: 1-2x/week  PT DURATION: 10 weeks  PLANNED INTERVENTIONS: Therapeutic exercises, Therapeutic activity, Neuromuscular re-education, Balance training, Gait training, Patient/Family education, Self Care, Joint mobilization, Stair training, Aquatic Therapy,  Electrical stimulation, Cryotherapy, Moist heat, Taping, Ultrasound, Ionotophoresis 4mg /ml Dexamethasone, and Manual therapy  PLAN FOR NEXT SESSION: Consider TUG, 5 STS test, 6 minute walk, screen balance; adjust goals review HEP  Lorayne Bender, PT, DPT   03/03/23

## 2023-03-09 ENCOUNTER — Encounter (HOSPITAL_BASED_OUTPATIENT_CLINIC_OR_DEPARTMENT_OTHER): Payer: Self-pay | Admitting: Physical Therapy

## 2023-03-10 ENCOUNTER — Encounter (HOSPITAL_BASED_OUTPATIENT_CLINIC_OR_DEPARTMENT_OTHER): Payer: Self-pay | Admitting: Physical Therapy

## 2023-03-10 ENCOUNTER — Ambulatory Visit (HOSPITAL_BASED_OUTPATIENT_CLINIC_OR_DEPARTMENT_OTHER): Payer: Medicare HMO | Admitting: Physical Therapy

## 2023-03-10 DIAGNOSIS — R262 Difficulty in walking, not elsewhere classified: Secondary | ICD-10-CM

## 2023-03-10 DIAGNOSIS — R2681 Unsteadiness on feet: Secondary | ICD-10-CM | POA: Diagnosis not present

## 2023-03-10 DIAGNOSIS — R42 Dizziness and giddiness: Secondary | ICD-10-CM | POA: Diagnosis not present

## 2023-03-10 NOTE — Therapy (Signed)
OUTPATIENT PHYSICAL THERAPY LOWER EXTREMITY Treatment    Patient Name: Jason [name] MRN: [id number] DOB:03-27-1936, 87 y.o., male Today's Date: 03/10/2023  END OF SESSION:  PT End of Session - 03/10/23 2104     Visit Number 20    Number of Visits 36    Date for PT Re-Evaluation 05/04/23    PT Start Time 0930    PT Stop Time 0955    PT Time Calculation (min) 25 min    Activity Tolerance Patient tolerated treatment well    Behavior During Therapy [facility] for tasks assessed/performed                Past Medical History:  Diagnosis Date   Ascending aortic aneurysm (HCC) 01/22/2022   CAD in native artery 01/22/2022   Cancer (HCC)    Cataract    Diabetes mellitus without complication (HCC)    DM (diabetes mellitus) (HCC)    Gait instability 01/22/2022   Hypertension    Sleep apnea    History reviewed. No pertinent surgical history. 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   Obstructive sleep apnea of adult 08/11/2022   Diabetic nephropathy with proteinuria (HCC) 08/11/2022   Obesity 08/11/2022   Hyperlipidemia 08/11/2022   Chronic kidney disease, stage 3a (HCC) 08/11/2022   Anxiety disorder, unspecified 08/11/2022   Diabetes mellitus (HCC) 08/11/2022   Primary malignant neoplasm of prostate (HCC) 08/02/2022   Carcinoma of prostate (HCC) 08/02/2022   Shortness of breath 02/22/2022   Pure hypercholesterolemia 02/22/2022   Pain in joint, shoulder region 02/22/2022   Other malaise and fatigue 02/22/2022   Nonexudative age-related macular degeneration, bilateral, intermediate dry stage 02/22/2022   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   Gastroesophageal reflux disease 02/22/2022   Benign paroxysmal positional vertigo 02/22/2022   CAD in native artery 01/22/2022   Thoracic aortic aneurysm, without rupture, unspecified (HCC) 01/22/2022   Gait instability 01/22/2022   Essential hypertension 01/17/2022   Type 2 diabetes mellitus without  complications (HCC) 01/17/2022   HLD (hyperlipidemia) 01/17/2022   Obstructive sleep apnea 01/17/2022   Sensorineural hearing loss, bilateral 11/09/2021   Orthostatic hypotension 11/09/2021   Imbalance 11/09/2021   Overweight 02/21/2020   First degree heart block 02/21/2020   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   Malignant tumor of prostate (HCC) 02/21/2020   Vitamin D deficiency 05/31/2016   History of malignant neoplasm of prostate 05/31/2016   History of malignant neoplasm of skin 05/31/2016   Bilateral hearing loss 05/31/2016   Anxiety state 05/31/2016   Bronchospasm 09/24/2010   Cough 09/19/2010   Acute bronchitis 09/19/2010   Allergic rhinitis due to pollen 09/19/2010   Acute sinusitis 09/19/2010    PCP: Meredith Staggers, MD  REFERRING PROVIDER: Meredith Staggers, MD  REFERRING DIAG: R26.81 (ICD-10-CM) - Unsteadiness   THERAPY DIAG:  Unsteadiness on feet  Dizziness and giddiness  Difficulty in walking, not elsewhere classified  Rationale for Evaluation and Treatment: Rehabilitation  ONSET DATE: march 2024  SUBJECTIVE:   SUBJECTIVE STATEMENT: The patient did not sleep last night. He reports feeling fatigued today.   PERTINENT HISTORY: Type 2 diabetes, sleep apnea, thoracic aortic aneurysm, orthorstatic hypotension  PAIN:  Are you having pain? No  PRECAUTIONS: None  WEIGHT BEARING RESTRICTIONS: No  FALLS:  Has patient fallen in last 6 months? Yes. Number of falls 2, step done on a small step. Another one reaching down and fell  LIVING ENVIRONMENT: Lives with: lives with their family Lives in: House/apartment Stairs: No  Has following equipment at home: Single point cane  OCCUPATION: retired   PLOF: Independent  PATIENT GOALS: walking without pain, being more stable  NEXT MD VISIT: nothing scheduled   OBJECTIVE:      DIAGNOSTIC FINDINGS: nothing pertinent   PATIENT SURVEYS:  FOTO    COGNITION: Overall cognitive status: Within functional limits  for tasks assessed     SENSATION: WFL  EDEMA:   MUSCLE LENGTH:   POSTURE: rounded shoulders  PALPATION:   LOWER EXTREMITY ROM:  Active ROM Right eval Left eval  Hip flexion    Hip extension    Hip abduction    Hip adduction    Hip internal rotation    Hip external rotation    Knee flexion    Knee extension    Ankle dorsiflexion    Ankle plantarflexion    Ankle inversion    Ankle eversion     (Blank rows = not tested)  LOWER EXTREMITY MMT:  MMT Right eval Left eval Right 6/11  Left  6/11 Right 7/23 Left 7/23  Hip flexion 19.5 19.3 painful 25.0 23.9 32.4 26.3  Hip extension        Hip abduction 22.5 27.1 42.1 41.2 46. 33.5  Hip adduction        Hip internal rotation        Hip external rotation        Knee flexion        Knee extension 27.4 33.2 45.1 42.5 45.4 52.3  Ankle dorsiflexion        Ankle plantarflexion        Ankle inversion        Ankle eversion         (Blank rows = not tested)  LOWER EXTREMITY SPECIAL TESTS:    5/1: Vitals 139/2mmHg 70bpm  4/26: Vitals: 166/108 at beginning Waited and took again: 168/96 Standing: 133/852 After seated exercise: 181/112 After rest:165/108 At end of session: 167/104  EVAL: Vitals:  Patient Initial blood pressure seated was 165/118.  Patient blood pressure was retaken after a few minutes and was 171/103. Patient blood pressure was also taken standing, 134/78 FUNCTIONAL TESTS:  Eval:TUG test: 16sec- No AD 5xSTS: 27sec (used arms to push up)  6/11  5XSTS 19  TUG 17 without AD   6 min walk test: 2.5 minutes until for seated rest break seated rest break of 2 minutes.  After rest break able to ambulate until 5:30 mark at which time he needed a seated rest break for the rest of the test.  Total distance 527 feet.  7/23 5x STS 13  6 min walk test 875 no rest break  After walk test 185/105      GAIT: Patients uses a SPC to ambulate.  Balance:   6/11 Narrow base of support standby  assist Narrow base of support eyes closed min assist Tandem stance min assist bilateral Single-leg stance unable to maintain  7/23 Narrow base of support standby assist Narrow base of support eyes closed min assist Tandem stance min assist bilateral Single-leg stance unable to maintain   TODAY'S TREATMENT:  DATE:  7/25 Vitals :   172/100 seated   Standing:   150/82  LAQ 3x10 red bilateral  Hip abduction 3x10 bilateral  Seated march 3x10 bilateral  At this time the patient reported he was having more fatigue. He would like to go home and rest.   Nu-step 5 min    7/23 Vitals :   168/89   132/75  Testing for gait distance/ strength/ and balance   See objective for testing measures Reviewed HEP d    7/18: Start of session: 138/101 (seated) 95/76 (standing) Held of PT today due to drastic change in BP.    7/15: Start of session: 130/74 (seated) 120/78 (standing)  Nu-step L5 6 min  157/92 67bpm Waited then took again at 144/92  FT EC 30sec x2 Tandem balance 30seconds ea Marching in hall- 1/2 hall x2 Sit to stands without hands x10  167/99     7/11 Prior to session blood pressure: 105/65 seated 110/71 standing 118/59 standing  Nu-step L3 BP taken again at 128/81 seated Gait without SPC-370ft (CGA as a precaution) Sidestepping at rail- x2laps   Narrow base eyes closed 3x30 seconds (light CGA) Modified tandem stance 2x30 seconds ea   End of session 164/92 seated  116/72 standing  7/2 Prior to session: 138/76 (seated) 79bpm  Standing: 112/78 81bpm NuStep 6 minutes level 5 156/94 after nu step 80bpm  Standing toe taps 2" box alternating- CGA no UE assist x20ea Hurdle step overs fwd/back (rail use and CGA) 2x10ea 164/93 82bpm 82bpm Gait in hall x71ft Sidestepping at rail- x1lap Lateral hurdle  step overs x10ea (rail use and SBA) 157/96 80bpm   PATIENT EDUCATION:  Education details: POC, Symptom management, HEP Person educated: Patient Education method: Explanation, Demonstration, Tactile cues, Verbal cues, and Handouts Education comprehension: verbalized understanding, returned demonstration, verbal cues required, tactile cues required, and needs further education  HOME EXERCISE PROGRAM: Access Code: [email or web address] URL: [email or web address] Date: 12/02/2022 Prepared by: Lorayne Bender  Exercises - Seated Hip Abduction with Resistance  - 1 x daily - 7 x weekly - 3 sets - 10 reps - Seated Knee Extension with Resistance  - 1 x daily - 7 x weekly - 3 sets - 10 reps - Seated Knee Lifts with Resistance  - 1 x daily - 7 x weekly - 3 sets - 10 reps  ASSESSMENT:  CLINICAL IMPRESSION: The patient came in today feeling bad. He dd not sleep last night. His B/P was high upon arrival. He attempted exercises but became more fatigued. He was advised to continue to monitor his B/P. He will be moving this week and next week.  OBJECTIVE IMPAIRMENTS: decreased activity tolerance, decreased balance, decreased endurance, difficulty walking, decreased strength, and dizziness.   ACTIVITY LIMITATIONS: carrying, lifting, bending, standing, squatting, sleeping, and stairs  PARTICIPATION LIMITATIONS: meal prep, cleaning, laundry, driving, shopping, community activity, and yard work  PERSONAL FACTORS: 3+ comorbidities: type 2 diabetes, orthostatic hypotension, sleep apnea, thoracic aortic anuerysm  are also affecting patient's functional outcome.   REHAB POTENTIAL: Good  CLINICAL DECISION MAKING: Evolving/moderate complexity  EVALUATION COMPLEXITY: Moderate   GOALS: Goals reviewed with patient? Yes  SHORT TERM GOALS: Target date: 5/9 Patient will increase bilateral strength by 5lbs. Baseline: Goal status: Significant improvement in strength overall initial goal which she achieved  7/23 new goal 5 pounds from measurements on 7/23  2.  Patient will be independent with HEP. Baseline:  Goal status: Performing base HEP at home 6/12  3.  Patient will  show minimal fluctuation and syncope with sit to stand transfer  Baseline:  Goal status: Minimal syncope.  Continues to have fluctuations in blood pressure  LONG TERM GOALS: Target date: 01/27/2023    Patient will transfer sit to stand without loss of balance or syncope  Baseline:  Goal status: MET 6/4  2.  Patient will perform daily activity with no loss of balance  Baseline:  Goal status: IN PROGRESS 6/12 improving but still some baseline balance issues goal continues to improve 7/23  3.  Patient will be able to walk community distances without any discomfort in left hip or feeling of lost balance .  Baseline:  Goal status: Mild pain in his hip but improving ability to ambulate in the community 7/23      PLAN:  PT FREQUENCY: 1-2x/week  PT DURATION: 10 weeks  PLANNED INTERVENTIONS: Therapeutic exercises, Therapeutic activity, Neuromuscular re-education, Balance training, Gait training, Patient/Family education, Self Care, Joint mobilization, Stair training, Aquatic Therapy, Electrical stimulation, Cryotherapy, Moist heat, Taping, Ultrasound, Ionotophoresis 4mg /ml Dexamethasone, and Manual therapy  PLAN FOR NEXT SESSION: Consider TUG, 5 STS test, 6 minute walk, screen balance; adjust goals review HEP  Lorayne Bender, PT, DPT   03/03/23

## 2023-03-15 ENCOUNTER — Encounter (HOSPITAL_BASED_OUTPATIENT_CLINIC_OR_DEPARTMENT_OTHER): Payer: Self-pay

## 2023-03-15 ENCOUNTER — Ambulatory Visit (HOSPITAL_BASED_OUTPATIENT_CLINIC_OR_DEPARTMENT_OTHER): Payer: Medicare HMO

## 2023-03-15 DIAGNOSIS — R262 Difficulty in walking, not elsewhere classified: Secondary | ICD-10-CM

## 2023-03-15 DIAGNOSIS — R2681 Unsteadiness on feet: Secondary | ICD-10-CM

## 2023-03-15 DIAGNOSIS — R42 Dizziness and giddiness: Secondary | ICD-10-CM

## 2023-03-15 NOTE — Therapy (Signed)
OUTPATIENT PHYSICAL THERAPY LOWER EXTREMITY Treatment    Patient Name: Jerode Moscatello MRN: [id number] DOB:1936-02-17, 87 y.o., male Today's Date: 03/15/2023  END OF SESSION:  PT End of Session - 03/15/23 0946     Visit Number 21    Number of Visits 36    Date for PT Re-Evaluation 05/04/23    PT Start Time 0933    PT Stop Time 1011    PT Time Calculation (min) 38 min    Activity Tolerance Patient limited by fatigue    Behavior During Therapy [facility] for tasks assessed/performed                 Past Medical History:  Diagnosis Date   Ascending aortic aneurysm (HCC) 01/22/2022   CAD in native artery 01/22/2022   Cancer (HCC)    Cataract    Diabetes mellitus without complication (HCC)    DM (diabetes mellitus) (HCC)    Gait instability 01/22/2022   Hypertension    Sleep apnea    History reviewed. No pertinent surgical history. 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   Obstructive sleep apnea of adult 08/11/2022   Diabetic nephropathy with proteinuria (HCC) 08/11/2022   Obesity 08/11/2022   Hyperlipidemia 08/11/2022   Chronic kidney disease, stage 3a (HCC) 08/11/2022   Anxiety disorder, unspecified 08/11/2022   Diabetes mellitus (HCC) 08/11/2022   Primary malignant neoplasm of prostate (HCC) 08/02/2022   Carcinoma of prostate (HCC) 08/02/2022   Shortness of breath 02/22/2022   Pure hypercholesterolemia 02/22/2022   Pain in joint, shoulder region 02/22/2022   Other malaise and fatigue 02/22/2022   Nonexudative age-related macular degeneration, bilateral, intermediate dry stage 02/22/2022   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   Gastroesophageal reflux disease 02/22/2022   Benign paroxysmal positional vertigo 02/22/2022   CAD in native artery 01/22/2022   Thoracic aortic aneurysm, without rupture, unspecified (HCC) 01/22/2022   Gait instability 01/22/2022   Essential hypertension 01/17/2022   Type 2 diabetes mellitus without  complications (HCC) 01/17/2022   HLD (hyperlipidemia) 01/17/2022   Obstructive sleep apnea 01/17/2022   Sensorineural hearing loss, bilateral 11/09/2021   Orthostatic hypotension 11/09/2021   Imbalance 11/09/2021   Overweight 02/21/2020   First degree heart block 02/21/2020   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   Malignant tumor of prostate (HCC) 02/21/2020   Vitamin D deficiency 05/31/2016   History of malignant neoplasm of prostate 05/31/2016   History of malignant neoplasm of skin 05/31/2016   Bilateral hearing loss 05/31/2016   Anxiety state 05/31/2016   Bronchospasm 09/24/2010   Cough 09/19/2010   Acute bronchitis 09/19/2010   Allergic rhinitis due to pollen 09/19/2010   Acute sinusitis 09/19/2010    PCP: Meredith Staggers, MD  REFERRING PROVIDER: Meredith Staggers, MD  REFERRING DIAG: R26.81 (ICD-10-CM) - Unsteadiness   THERAPY DIAG:  Unsteadiness on feet  Dizziness and giddiness  Difficulty in walking, not elsewhere classified  Rationale for Evaluation and Treatment: Rehabilitation  ONSET DATE: march 2024  SUBJECTIVE:   SUBJECTIVE STATEMENT: Pt reports he fell on Sunday when taking his trach can to the curb. "A guy was out running and helped me up. I didn't hurt myself, but I've been sore ever since." Pt reports poor sleep quality and has been tired from preparing for his upcoming move this week.   PERTINENT HISTORY: Type 2 diabetes, sleep apnea, thoracic aortic aneurysm, orthorstatic hypotension  PAIN:  Are you having pain? No  PRECAUTIONS: None  WEIGHT BEARING RESTRICTIONS: No  FALLS:  Has patient fallen in last 6 months? Yes. Number of falls 2, step done on a small step. Another one reaching down and fell  LIVING ENVIRONMENT: Lives with: lives with their family Lives in: House/apartment Stairs: No Has following equipment at home: Single point cane  OCCUPATION: retired   PLOF: Independent  PATIENT GOALS: walking without pain, being more stable  NEXT  MD VISIT: nothing scheduled   OBJECTIVE:      DIAGNOSTIC FINDINGS: nothing pertinent   PATIENT SURVEYS:  FOTO    COGNITION: Overall cognitive status: Within functional limits for tasks assessed     SENSATION: WFL  EDEMA:   MUSCLE LENGTH:   POSTURE: rounded shoulders  PALPATION:   LOWER EXTREMITY ROM:  Active ROM Right eval Left eval  Hip flexion    Hip extension    Hip abduction    Hip adduction    Hip internal rotation    Hip external rotation    Knee flexion    Knee extension    Ankle dorsiflexion    Ankle plantarflexion    Ankle inversion    Ankle eversion     (Blank rows = not tested)  LOWER EXTREMITY MMT:  MMT Right eval Left eval Right 6/11  Left  6/11 Right 7/23 Left 7/23  Hip flexion 19.5 19.3 painful 25.0 23.9 32.4 26.3  Hip extension        Hip abduction 22.5 27.1 42.1 41.2 46. 33.5  Hip adduction        Hip internal rotation        Hip external rotation        Knee flexion        Knee extension 27.4 33.2 45.1 42.5 45.4 52.3  Ankle dorsiflexion        Ankle plantarflexion        Ankle inversion        Ankle eversion         (Blank rows = not tested)  LOWER EXTREMITY SPECIAL TESTS:    5/1: Vitals 139/43mmHg 70bpm  4/26: Vitals: 166/108 at beginning Waited and took again: 168/96 Standing: 133/852 After seated exercise: 181/112 After rest:165/108 At end of session: 167/104  EVAL: Vitals:  Patient Initial blood pressure seated was 165/118.  Patient blood pressure was retaken after a few minutes and was 171/103. Patient blood pressure was also taken standing, 134/78 FUNCTIONAL TESTS:  Eval:TUG test: 16sec- No AD 5xSTS: 27sec (used arms to push up)  6/11  5XSTS 19  TUG 17 without AD   6 min walk test: 2.5 minutes until for seated rest break seated rest break of 2 minutes.  After rest break able to ambulate until 5:30 mark at which time he needed a seated rest break for the rest of the test.  Total distance 527  feet.  7/23 5x STS 13  6 min walk test 875 no rest break  After walk test 185/105      GAIT: Patients uses a SPC to ambulate.  Balance:   6/11 Narrow base of support standby assist Narrow base of support eyes closed min assist Tandem stance min assist bilateral Single-leg stance unable to maintain  7/23 Narrow base of support standby assist Narrow base of support eyes closed min assist Tandem stance min assist bilateral Single-leg stance unable to maintain   TODAY'S TREATMENT:  DATE:   7/30 Vitals: 174/84 seated  Standing 125/82  Nu-step L4 165/94  Narrow base eyes closed 2x30 seconds (light CGA) Sidestepping at rail x2 laps  177/102 166/99   7/25 Vitals :   172/100 seated   Standing:   150/82  LAQ 3x10 red bilateral  Hip abduction 3x10 bilateral  Seated march 3x10 bilateral  At this time the patient reported he was having more fatigue. He would like to go home and rest.   Nu-step 5 min    7/23 Vitals :   168/89   132/75  Testing for gait distance/ strength/ and balance   See objective for testing measures Reviewed HEP d    7/18: Start of session: 138/101 (seated) 95/76 (standing) Held of PT today due to drastic change in BP.    7/15: Start of session: 130/74 (seated) 120/78 (standing)  Nu-step L5 6 min  157/92 67bpm Waited then took again at 144/92  FT EC 30sec x2 Tandem balance 30seconds ea Marching in hall- 1/2 hall x2 Sit to stands without hands x10  167/99     7/11 Prior to session blood pressure: 105/65 seated 110/71 standing 118/59 standing  Nu-step L3 BP taken again at 128/81 seated Gait without SPC-315ft (CGA as a precaution) Sidestepping at rail- x2laps   Narrow base eyes closed 3x30 seconds (light CGA) Modified tandem stance 2x30 seconds ea   End of  session 164/92 seated  116/72 standing  7/2 Prior to session: 138/76 (seated) 79bpm  Standing: 112/78 81bpm NuStep 6 minutes level 5 156/94 after nu step 80bpm  Standing toe taps 2" box alternating- CGA no UE assist x20ea Hurdle step overs fwd/back (rail use and CGA) 2x10ea 164/93 82bpm 82bpm Gait in hall x39ft Sidestepping at rail- x1lap Lateral hurdle step overs x10ea (rail use and SBA) 157/96 80bpm   PATIENT EDUCATION:  Education details: POC, Symptom management, HEP Person educated: Patient Education method: Explanation, Demonstration, Tactile cues, Verbal cues, and Handouts Education comprehension: verbalized understanding, returned demonstration, verbal cues required, tactile cues required, and needs further education  HOME EXERCISE PROGRAM: Access Code: [email or web address] URL: [email or web address] Date: 12/02/2022 Prepared by: Lorayne Bender  Exercises - Seated Hip Abduction with Resistance  - 1 x daily - 7 x weekly - 3 sets - 10 reps - Seated Knee Extension with Resistance  - 1 x daily - 7 x weekly - 3 sets - 10 reps - Seated Knee Lifts with Resistance  - 1 x daily - 7 x weekly - 3 sets - 10 reps  ASSESSMENT:  CLINICAL IMPRESSION: Session limited today due to fatigue and BP fluctuation. He was able to tolerate gentle exercises, but did feel shaky by end of session, so stopped session. Will continue to monitor BP and proceed with exercise as tolerated.  OBJECTIVE IMPAIRMENTS: decreased activity tolerance, decreased balance, decreased endurance, difficulty walking, decreased strength, and dizziness.   ACTIVITY LIMITATIONS: carrying, lifting, bending, standing, squatting, sleeping, and stairs  PARTICIPATION LIMITATIONS: meal prep, cleaning, laundry, driving, shopping, community activity, and yard work  PERSONAL FACTORS: 3+ comorbidities: type 2 diabetes, orthostatic hypotension, sleep apnea, thoracic aortic anuerysm  are also affecting patient's  functional outcome.   REHAB POTENTIAL: Good  CLINICAL DECISION MAKING: Evolving/moderate complexity  EVALUATION COMPLEXITY: Moderate   GOALS: Goals reviewed with patient? Yes  SHORT TERM GOALS: Target date: 5/9 Patient will increase bilateral strength by 5lbs. Baseline: Goal status: Significant improvement in strength overall initial goal which she achieved 7/23 new goal 5 pounds from measurements  on 7/23  2.  Patient will be independent with HEP. Baseline:  Goal status: Performing base HEP at home 6/12  3.  Patient will show minimal fluctuation and syncope with sit to stand transfer  Baseline:  Goal status: Minimal syncope.  Continues to have fluctuations in blood pressure  LONG TERM GOALS: Target date: 01/27/2023    Patient will transfer sit to stand without loss of balance or syncope  Baseline:  Goal status: MET 6/4  2.  Patient will perform daily activity with no loss of balance  Baseline:  Goal status: IN PROGRESS 6/12 improving but still some baseline balance issues goal continues to improve 7/23  3.  Patient will be able to walk community distances without any discomfort in left hip or feeling of lost balance .  Baseline:  Goal status: Mild pain in his hip but improving ability to ambulate in the community 7/23      PLAN:  PT FREQUENCY: 1-2x/week  PT DURATION: 10 weeks  PLANNED INTERVENTIONS: Therapeutic exercises, Therapeutic activity, Neuromuscular re-education, Balance training, Gait training, Patient/Family education, Self Care, Joint mobilization, Stair training, Aquatic Therapy, Electrical stimulation, Cryotherapy, Moist heat, Taping, Ultrasound, Ionotophoresis 4mg /ml Dexamethasone, and Manual therapy  PLAN FOR NEXT SESSION: Consider TUG, 5 STS test, 6 minute walk, screen balance; adjust goals review HEP  Riki Altes, PTA   02/3023

## 2023-03-17 ENCOUNTER — Encounter (HOSPITAL_BASED_OUTPATIENT_CLINIC_OR_DEPARTMENT_OTHER): Payer: Medicare HMO | Admitting: Physical Therapy

## 2023-03-22 ENCOUNTER — Encounter (HOSPITAL_BASED_OUTPATIENT_CLINIC_OR_DEPARTMENT_OTHER): Payer: Self-pay | Admitting: Physical Therapy

## 2023-03-22 ENCOUNTER — Ambulatory Visit (HOSPITAL_BASED_OUTPATIENT_CLINIC_OR_DEPARTMENT_OTHER): Payer: Medicare HMO | Attending: Family Medicine | Admitting: Physical Therapy

## 2023-03-22 DIAGNOSIS — R2681 Unsteadiness on feet: Secondary | ICD-10-CM | POA: Insufficient documentation

## 2023-03-22 DIAGNOSIS — R42 Dizziness and giddiness: Secondary | ICD-10-CM | POA: Insufficient documentation

## 2023-03-22 DIAGNOSIS — R262 Difficulty in walking, not elsewhere classified: Secondary | ICD-10-CM | POA: Insufficient documentation

## 2023-03-22 NOTE — Therapy (Signed)
OUTPATIENT PHYSICAL THERAPY LOWER EXTREMITY Treatment    Patient Name: Jason [name]  MRN: [id number] DOB:1936/05/30, 87 y.o., male Today's Date: 03/22/2023  END OF SESSION:  PT End of Session - 03/22/23 1107     Visit Number 22    Number of Visits 36    Date for PT Re-Evaluation 05/04/23    PT Start Time 0934    PT Stop Time 1015    PT Time Calculation (min) 41 min    Activity Tolerance Patient limited by fatigue    Behavior During Therapy [facility] for tasks assessed/performed                  Past Medical History:  Diagnosis Date   Ascending aortic aneurysm (HCC) 01/22/2022   CAD in native artery 01/22/2022   Cancer (HCC)    Cataract    Diabetes mellitus without complication (HCC)    DM (diabetes mellitus) (HCC)    Gait instability 01/22/2022   Hypertension    Sleep apnea    History reviewed. No pertinent surgical history. 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   Obstructive sleep apnea of adult 08/11/2022   Diabetic nephropathy with proteinuria (HCC) 08/11/2022   Obesity 08/11/2022   Hyperlipidemia 08/11/2022   Chronic kidney disease, stage 3a (HCC) 08/11/2022   Anxiety disorder, unspecified 08/11/2022   Diabetes mellitus (HCC) 08/11/2022   Primary malignant neoplasm of prostate (HCC) 08/02/2022   Carcinoma of prostate (HCC) 08/02/2022   Shortness of breath 02/22/2022   Pure hypercholesterolemia 02/22/2022   Pain in joint, shoulder region 02/22/2022   Other malaise and fatigue 02/22/2022   Nonexudative age-related macular degeneration, bilateral, intermediate dry stage 02/22/2022   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   Gastroesophageal reflux disease 02/22/2022   Benign paroxysmal positional vertigo 02/22/2022   CAD in native artery 01/22/2022   Thoracic aortic aneurysm, without rupture, unspecified (HCC) 01/22/2022   Gait instability 01/22/2022   Essential hypertension 01/17/2022   Type 2 diabetes mellitus without  complications (HCC) 01/17/2022   HLD (hyperlipidemia) 01/17/2022   Obstructive sleep apnea 01/17/2022   Sensorineural hearing loss, bilateral 11/09/2021   Orthostatic hypotension 11/09/2021   Imbalance 11/09/2021   Overweight 02/21/2020   First degree heart block 02/21/2020   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   Malignant tumor of prostate (HCC) 02/21/2020   Vitamin D deficiency 05/31/2016   History of malignant neoplasm of prostate 05/31/2016   History of malignant neoplasm of skin 05/31/2016   Bilateral hearing loss 05/31/2016   Anxiety state 05/31/2016   Bronchospasm 09/24/2010   Cough 09/19/2010   Acute bronchitis 09/19/2010   Allergic rhinitis due to pollen 09/19/2010   Acute sinusitis 09/19/2010    PCP: Meredith Staggers, MD  REFERRING PROVIDER: Meredith Staggers, MD  REFERRING DIAG: R26.81 (ICD-10-CM) - Unsteadiness   THERAPY DIAG:  Unsteadiness on feet  Dizziness and giddiness  Difficulty in walking, not elsewhere classified  Rationale for Evaluation and Treatment: Rehabilitation  ONSET DATE: march 2024  SUBJECTIVE:   SUBJECTIVE STATEMENT: The patient has had 2 falls over the past 2 weeks. He feels like he is just losing his balance. He is also moving which is stressful.  PERTINENT HISTORY: Type 2 diabetes, sleep apnea, thoracic aortic aneurysm, orthorstatic hypotension  PAIN:  Are you having pain? No  PRECAUTIONS: None  WEIGHT BEARING RESTRICTIONS: No  FALLS:  Has patient fallen in last 6 months? Yes. Number of falls 2, step done on a small step. Another one reaching down  and fell  LIVING ENVIRONMENT: Lives with: lives with their family Lives in: House/apartment Stairs: No Has following equipment at home: Single point cane  OCCUPATION: retired   PLOF: Independent  PATIENT GOALS: walking without pain, being more stable  NEXT MD VISIT: nothing scheduled   OBJECTIVE:      DIAGNOSTIC FINDINGS: nothing pertinent   PATIENT SURVEYS:  FOTO     COGNITION: Overall cognitive status: Within functional limits for tasks assessed     SENSATION: WFL  EDEMA:   MUSCLE LENGTH:   POSTURE: rounded shoulders  PALPATION:   LOWER EXTREMITY ROM:  Active ROM Right eval Left eval  Hip flexion    Hip extension    Hip abduction    Hip adduction    Hip internal rotation    Hip external rotation    Knee flexion    Knee extension    Ankle dorsiflexion    Ankle plantarflexion    Ankle inversion    Ankle eversion     (Blank rows = not tested)  LOWER EXTREMITY MMT:  MMT Right eval Left eval Right 6/11  Left  6/11 Right 7/23 Left 7/23  Hip flexion 19.5 19.3 painful 25.0 23.9 32.4 26.3  Hip extension        Hip abduction 22.5 27.1 42.1 41.2 46. 33.5  Hip adduction        Hip internal rotation        Hip external rotation        Knee flexion        Knee extension 27.4 33.2 45.1 42.5 45.4 52.3  Ankle dorsiflexion        Ankle plantarflexion        Ankle inversion        Ankle eversion         (Blank rows = not tested)  LOWER EXTREMITY SPECIAL TESTS:    5/1: Vitals 139/90mmHg 70bpm  4/26: Vitals: 166/108 at beginning Waited and took again: 168/96 Standing: 133/852 After seated exercise: 181/112 After rest:165/108 At end of session: 167/104  EVAL: Vitals:  Patient Initial blood pressure seated was 165/118.  Patient blood pressure was retaken after a few minutes and was 171/103. Patient blood pressure was also taken standing, 134/78 FUNCTIONAL TESTS:  Eval:TUG test: 16sec- No AD 5xSTS: 27sec (used arms to push up)  6/11  5XSTS 19  TUG 17 without AD   6 min walk test: 2.5 minutes until for seated rest break seated rest break of 2 minutes.  After rest break able to ambulate until 5:30 mark at which time he needed a seated rest break for the rest of the test.  Total distance 527 feet.  7/23 5x STS 13  6 min walk test 875 no rest break  After walk test 185/105      GAIT: Patients uses a SPC to  ambulate.  Balance:   6/11 Narrow base of support standby assist Narrow base of support eyes closed min assist Tandem stance min assist bilateral Single-leg stance unable to maintain  7/23 Narrow base of support standby assist Narrow base of support eyes closed min assist Tandem stance min assist bilateral Single-leg stance unable to maintain   TODAY'S TREATMENT:  DATE:  8/6  Seated 186/100  Standing 146/87   Nu step 6 min L3   Heel/ Rock: 2x15 More difficulty controlling forward movement. Reports more difficulty going down hill and changing speeds   Gait: hallway 50'x2. With verbal commands to speed up and slow down. No loss of balance noted but minor fatigue  Standing slow march 2x15 with min UE support and CGA with gait belt.   7/30 Vitals: 174/84 seated  Standing 125/82  Nu-step L4 165/94  Narrow base eyes closed 2x30 seconds (light CGA) Sidestepping at rail x2 laps  177/102 166/99   7/25 Vitals :   172/100 seated   Standing:   150/82  LAQ 3x10 red bilateral  Hip abduction 3x10 bilateral  Seated march 3x10 bilateral  At this time the patient reported he was having more fatigue. He would like to go home and rest.   Nu-step 5 min    7/23 Vitals :   168/89   132/75  Testing for gait distance/ strength/ and balance   See objective for testing measures Reviewed HEP d    7/18: Start of session: 138/101 (seated) 95/76 (standing) Held of PT today due to drastic change in BP.    7/15: Start of session: 130/74 (seated) 120/78 (standing)  Nu-step L5 6 min  157/92 67bpm Waited then took again at 144/92  FT EC 30sec x2 Tandem balance 30seconds ea Marching in hall- 1/2 hall x2 Sit to stands without hands x10  167/99     7/11 Prior to session blood pressure: 105/65 seated 110/71  standing 118/59 standing  Nu-step L3 BP taken again at 128/81 seated Gait without SPC-357ft (CGA as a precaution) Sidestepping at rail- x2laps   Narrow base eyes closed 3x30 seconds (light CGA) Modified tandem stance 2x30 seconds ea   End of session 164/92 seated  116/72 standing  7/2 Prior to session: 138/76 (seated) 79bpm  Standing: 112/78 81bpm NuStep 6 minutes level 5 156/94 after nu step 80bpm  Standing toe taps 2" box alternating- CGA no UE assist x20ea Hurdle step overs fwd/back (rail use and CGA) 2x10ea 164/93 82bpm 82bpm Gait in hall x34ft Sidestepping at rail- x1lap Lateral hurdle step overs x10ea (rail use and SBA) 157/96 80bpm   PATIENT EDUCATION:  Education details: POC, Symptom management, HEP Person educated: Patient Education method: Explanation, Demonstration, Tactile cues, Verbal cues, and Handouts Education comprehension: verbalized understanding, returned demonstration, verbal cues required, tactile cues required, and needs further education  HOME EXERCISE PROGRAM: Access Code: [email or web address] URL: [email or web address] Date: 12/02/2022 Prepared by: Lorayne Bender  Exercises - Seated Hip Abduction with Resistance  - 1 x daily - 7 x weekly - 3 sets - 10 reps - Seated Knee Extension with Resistance  - 1 x daily - 7 x weekly - 3 sets - 10 reps - Seated Knee Lifts with Resistance  - 1 x daily - 7 x weekly - 3 sets - 10 reps  ASSESSMENT:  CLINICAL IMPRESSION: Patient came in today with high baseline B/P which was fluctuating. He wanted to try to do as much as he could. He did well with his exercises. He reports increased difficulty going down hills and ambulating and controlling his speed. We worked on drills to work on those problems. He hd one LOB with step downs. Therapy was able to correct with mod A. We will continue to work on these things as tolerated.    OBJECTIVE IMPAIRMENTS: decreased activity tolerance, decreased  balance, decreased endurance, difficulty walking,  decreased strength, and dizziness.   ACTIVITY LIMITATIONS: carrying, lifting, bending, standing, squatting, sleeping, and stairs  PARTICIPATION LIMITATIONS: meal prep, cleaning, laundry, driving, shopping, community activity, and yard work  PERSONAL FACTORS: 3+ comorbidities: type 2 diabetes, orthostatic hypotension, sleep apnea, thoracic aortic anuerysm  are also affecting patient's functional outcome.   REHAB POTENTIAL: Good  CLINICAL DECISION MAKING: Evolving/moderate complexity  EVALUATION COMPLEXITY: Moderate   GOALS: Goals reviewed with patient? Yes  SHORT TERM GOALS: Target date: 5/9 Patient will increase bilateral strength by 5lbs. Baseline: Goal status: Significant improvement in strength overall initial goal which she achieved 7/23 new goal 5 pounds from measurements on 7/23  2.  Patient will be independent with HEP. Baseline:  Goal status: Performing base HEP at home 6/12  3.  Patient will show minimal fluctuation and syncope with sit to stand transfer  Baseline:  Goal status: Minimal syncope.  Continues to have fluctuations in blood pressure  LONG TERM GOALS: Target date: 01/27/2023    Patient will transfer sit to stand without loss of balance or syncope  Baseline:  Goal status: MET 6/4  2.  Patient will perform daily activity with no loss of balance  Baseline:  Goal status: IN PROGRESS 6/12 improving but still some baseline balance issues goal continues to improve 7/23  3.  Patient will be able to walk community distances without any discomfort in left hip or feeling of lost balance .  Baseline:  Goal status: Mild pain in his hip but improving ability to ambulate in the community 7/23      PLAN:  PT FREQUENCY: 1-2x/week  PT DURATION: 10 weeks  PLANNED INTERVENTIONS: Therapeutic exercises, Therapeutic activity, Neuromuscular re-education, Balance training, Gait training, Patient/Family education,  Self Care, Joint mobilization, Stair training, Aquatic Therapy, Electrical stimulation, Cryotherapy, Moist heat, Taping, Ultrasound, Ionotophoresis 4mg /ml Dexamethasone, and Manual therapy  PLAN FOR NEXT SESSION: Consider TUG, 5 STS test, 6 minute walk, screen balance; adjust goals review HEP   Lorayne Bender PT DPT 02/3023

## 2023-03-25 ENCOUNTER — Telehealth: Payer: Self-pay

## 2023-03-25 NOTE — Telephone Encounter (Signed)
Pt is requesting to transfer FROM: Dr. Neva Seat [facility]  Pt is requesting to transfer TO: Dr. Kasandra Knudsen  Reason for requested transfer:  patient and his wife have moved to [facility].  They both would like to transfer from Dr. Neva Seat to Dr. Milinda Cave.  Best contact number: [phone]

## 2023-03-25 NOTE — Telephone Encounter (Signed)
Nice gentleman. Sorry to see him transfer but ok with transfer. Thanks.

## 2023-03-25 NOTE — Telephone Encounter (Signed)
App. Scheduled 8/23

## 2023-03-28 ENCOUNTER — Encounter (HOSPITAL_BASED_OUTPATIENT_CLINIC_OR_DEPARTMENT_OTHER): Payer: Self-pay

## 2023-03-28 ENCOUNTER — Ambulatory Visit (HOSPITAL_BASED_OUTPATIENT_CLINIC_OR_DEPARTMENT_OTHER): Payer: Medicare HMO

## 2023-03-28 DIAGNOSIS — R2681 Unsteadiness on feet: Secondary | ICD-10-CM

## 2023-03-28 DIAGNOSIS — R42 Dizziness and giddiness: Secondary | ICD-10-CM

## 2023-03-28 DIAGNOSIS — R262 Difficulty in walking, not elsewhere classified: Secondary | ICD-10-CM

## 2023-03-28 NOTE — Therapy (Signed)
OUTPATIENT PHYSICAL THERAPY LOWER EXTREMITY Treatment    Patient Name: Jason [name] MRN: [id number] DOB:1936/04/18, 87 y.o., male Today's Date: 03/28/2023  END OF SESSION:  PT End of Session - 03/28/23 0947     Visit Number 23    Number of Visits 36    Date for PT Re-Evaluation 05/04/23    PT Start Time 0933    PT Stop Time 0955    PT Time Calculation (min) 22 min    Activity Tolerance Patient limited by fatigue    Behavior During Therapy [facility] for tasks assessed/performed                  Past Medical History:  Diagnosis Date   Ascending aortic aneurysm (HCC) 01/22/2022   CAD in native artery 01/22/2022   Cancer (HCC)    Cataract    Diabetes mellitus without complication (HCC)    DM (diabetes mellitus) (HCC)    Gait instability 01/22/2022   Hypertension    Sleep apnea    History reviewed. No pertinent surgical history. 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   Obstructive sleep apnea of adult 08/11/2022   Diabetic nephropathy with proteinuria (HCC) 08/11/2022   Obesity 08/11/2022   Hyperlipidemia 08/11/2022   Chronic kidney disease, stage 3a (HCC) 08/11/2022   Anxiety disorder, unspecified 08/11/2022   Diabetes mellitus (HCC) 08/11/2022   Primary malignant neoplasm of prostate (HCC) 08/02/2022   Carcinoma of prostate (HCC) 08/02/2022   Shortness of breath 02/22/2022   Pure hypercholesterolemia 02/22/2022   Pain in joint, shoulder region 02/22/2022   Other malaise and fatigue 02/22/2022   Nonexudative age-related macular degeneration, bilateral, intermediate dry stage 02/22/2022   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   Gastroesophageal reflux disease 02/22/2022   Benign paroxysmal positional vertigo 02/22/2022   CAD in native artery 01/22/2022   Thoracic aortic aneurysm, without rupture, unspecified (HCC) 01/22/2022   Gait instability 01/22/2022   Essential hypertension 01/17/2022   Type 2 diabetes mellitus without  complications (HCC) 01/17/2022   HLD (hyperlipidemia) 01/17/2022   Obstructive sleep apnea 01/17/2022   Sensorineural hearing loss, bilateral 11/09/2021   Orthostatic hypotension 11/09/2021   Imbalance 11/09/2021   Overweight 02/21/2020   First degree heart block 02/21/2020   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   Malignant tumor of prostate (HCC) 02/21/2020   Vitamin D deficiency 05/31/2016   History of malignant neoplasm of prostate 05/31/2016   History of malignant neoplasm of skin 05/31/2016   Bilateral hearing loss 05/31/2016   Anxiety state 05/31/2016   Bronchospasm 09/24/2010   Cough 09/19/2010   Acute bronchitis 09/19/2010   Allergic rhinitis due to pollen 09/19/2010   Acute sinusitis 09/19/2010    PCP: Meredith Staggers, MD  REFERRING PROVIDER: Meredith Staggers, MD  REFERRING DIAG: R26.81 (ICD-10-CM) - Unsteadiness   THERAPY DIAG:  Unsteadiness on feet  Dizziness and giddiness  Difficulty in walking, not elsewhere classified  Rationale for Evaluation and Treatment: Rehabilitation  ONSET DATE: march 2024  SUBJECTIVE:   SUBJECTIVE STATEMENT: The patient has had 2 falls over the past 2 weeks. He feels like he is just losing his balance. He is also moving which is stressful.  PERTINENT HISTORY: Type 2 diabetes, sleep apnea, thoracic aortic aneurysm, orthorstatic hypotension  PAIN:  Are you having pain? No  PRECAUTIONS: None  WEIGHT BEARING RESTRICTIONS: No  FALLS:  Has patient fallen in last 6 months? Yes. Number of falls 2, step done on a small step. Another one reaching down  and fell  LIVING ENVIRONMENT: Lives with: lives with their family Lives in: House/apartment Stairs: No Has following equipment at home: Single point cane  OCCUPATION: retired   PLOF: Independent  PATIENT GOALS: walking without pain, being more stable  NEXT MD VISIT: nothing scheduled   OBJECTIVE:      DIAGNOSTIC FINDINGS: nothing pertinent   PATIENT SURVEYS:  FOTO     COGNITION: Overall cognitive status: Within functional limits for tasks assessed     SENSATION: WFL  EDEMA:   MUSCLE LENGTH:   POSTURE: rounded shoulders  PALPATION:   LOWER EXTREMITY ROM:  Active ROM Right eval Left eval  Hip flexion    Hip extension    Hip abduction    Hip adduction    Hip internal rotation    Hip external rotation    Knee flexion    Knee extension    Ankle dorsiflexion    Ankle plantarflexion    Ankle inversion    Ankle eversion     (Blank rows = not tested)  LOWER EXTREMITY MMT:  MMT Right eval Left eval Right 6/11  Left  6/11 Right 7/23 Left 7/23  Hip flexion 19.5 19.3 painful 25.0 23.9 32.4 26.3  Hip extension        Hip abduction 22.5 27.1 42.1 41.2 46. 33.5  Hip adduction        Hip internal rotation        Hip external rotation        Knee flexion        Knee extension 27.4 33.2 45.1 42.5 45.4 52.3  Ankle dorsiflexion        Ankle plantarflexion        Ankle inversion        Ankle eversion         (Blank rows = not tested)  LOWER EXTREMITY SPECIAL TESTS:    5/1: Vitals 139/38mmHg 70bpm  4/26: Vitals: 166/108 at beginning Waited and took again: 168/96 Standing: 133/852 After seated exercise: 181/112 After rest:165/108 At end of session: 167/104  EVAL: Vitals:  Patient Initial blood pressure seated was 165/118.  Patient blood pressure was retaken after a few minutes and was 171/103. Patient blood pressure was also taken standing, 134/78 FUNCTIONAL TESTS:  Eval:TUG test: 16sec- No AD 5xSTS: 27sec (used arms to push up)  6/11  5XSTS 19  TUG 17 without AD   6 min walk test: 2.5 minutes until for seated rest break seated rest break of 2 minutes.  After rest break able to ambulate until 5:30 mark at which time he needed a seated rest break for the rest of the test.  Total distance 527 feet.  7/23 5x STS 13  6 min walk test 875 no rest break  After walk test 185/105      GAIT: Patients uses a SPC to  ambulate.  Balance:   6/11 Narrow base of support standby assist Narrow base of support eyes closed min assist Tandem stance min assist bilateral Single-leg stance unable to maintain  7/23 Narrow base of support standby assist Narrow base of support eyes closed min assist Tandem stance min assist bilateral Single-leg stance unable to maintain   TODAY'S TREATMENT:  DATE:   8/12 Nu-step See assessment for BP measurements Ended session prematurely due to BP values and complaints of fatigue from pt.    8/6  Seated 186/100  Standing 146/87   Nu step 6 min L3   Heel/ Rock: 2x15 More difficulty controlling forward movement. Reports more difficulty going down hill and changing speeds   Gait: hallway 50'x2. With verbal commands to speed up and slow down. No loss of balance noted but minor fatigue  Standing slow march 2x15 with min UE support and CGA with gait belt.   7/30 Vitals: 174/84 seated  Standing 125/82  Nu-step L4 165/94  Narrow base eyes closed 2x30 seconds (light CGA) Sidestepping at rail x2 laps  177/102 166/99   7/25 Vitals :   172/100 seated   Standing:   150/82  LAQ 3x10 red bilateral  Hip abduction 3x10 bilateral  Seated march 3x10 bilateral  At this time the patient reported he was having more fatigue. He would like to go home and rest.   Nu-step 5 min    7/23 Vitals :   168/89   132/75  Testing for gait distance/ strength/ and balance   See objective for testing measures Reviewed HEP d    7/18: Start of session: 138/101 (seated) 95/76 (standing) Held of PT today due to drastic change in BP.    7/15: Start of session: 130/74 (seated) 120/78 (standing)  Nu-step L5 6 min  157/92 67bpm Waited then took again at 144/92  FT EC 30sec x2 Tandem balance 30seconds ea Marching in  hall- 1/2 hall x2 Sit to stands without hands x10  167/99     7/11 Prior to session blood pressure: 105/65 seated 110/71 standing 118/59 standing  Nu-step L3 BP taken again at 128/81 seated Gait without SPC-342ft (CGA as a precaution) Sidestepping at rail- x2laps   Narrow base eyes closed 3x30 seconds (light CGA) Modified tandem stance 2x30 seconds ea   End of session 164/92 seated  116/72 standing  7/2 Prior to session: 138/76 (seated) 79bpm  Standing: 112/78 81bpm NuStep 6 minutes level 5 156/94 after nu step 80bpm  Standing toe taps 2" box alternating- CGA no UE assist x20ea Hurdle step overs fwd/back (rail use and CGA) 2x10ea 164/93 82bpm 82bpm Gait in hall x44ft Sidestepping at rail- x1lap Lateral hurdle step overs x10ea (rail use and SBA) 157/96 80bpm   PATIENT EDUCATION:  Education details: POC, Symptom management, HEP Person educated: Patient Education method: Explanation, Demonstration, Tactile cues, Verbal cues, and Handouts Education comprehension: verbalized understanding, returned demonstration, verbal cues required, tactile cues required, and needs further education  HOME EXERCISE PROGRAM: Access Code: [email or web address] URL: [email or web address] Date: 12/02/2022 Prepared by: Lorayne Bender  Exercises - Seated Hip Abduction with Resistance  - 1 x daily - 7 x weekly - 3 sets - 10 reps - Seated Knee Extension with Resistance  - 1 x daily - 7 x weekly - 3 sets - 10 reps - Seated Knee Lifts with Resistance  - 1 x daily - 7 x weekly - 3 sets - 10 reps  ASSESSMENT:  CLINICAL IMPRESSION: Bp started at 167/93 at entry. Rose to 173/98 after nu-step. After seated break, took again at 179/100. Rested for additional time and took again at 172/96 in seated. In standing he measured 120/77. Decided to end session at this time due to subjective c/o fatigue and dramatic drop in seated vs. Standing BP.    OBJECTIVE IMPAIRMENTS: decreased  activity tolerance, decreased balance,  decreased endurance, difficulty walking, decreased strength, and dizziness.   ACTIVITY LIMITATIONS: carrying, lifting, bending, standing, squatting, sleeping, and stairs  PARTICIPATION LIMITATIONS: meal prep, cleaning, laundry, driving, shopping, community activity, and yard work  PERSONAL FACTORS: 3+ comorbidities: type 2 diabetes, orthostatic hypotension, sleep apnea, thoracic aortic anuerysm  are also affecting patient's functional outcome.   REHAB POTENTIAL: Good  CLINICAL DECISION MAKING: Evolving/moderate complexity  EVALUATION COMPLEXITY: Moderate   GOALS: Goals reviewed with patient? Yes  SHORT TERM GOALS: Target date: 5/9 Patient will increase bilateral strength by 5lbs. Baseline: Goal status: Significant improvement in strength overall initial goal which she achieved 7/23 new goal 5 pounds from measurements on 7/23  2.  Patient will be independent with HEP. Baseline:  Goal status: Performing base HEP at home 6/12  3.  Patient will show minimal fluctuation and syncope with sit to stand transfer  Baseline:  Goal status: Minimal syncope.  Continues to have fluctuations in blood pressure  LONG TERM GOALS: Target date: 01/27/2023    Patient will transfer sit to stand without loss of balance or syncope  Baseline:  Goal status: MET 6/4  2.  Patient will perform daily activity with no loss of balance  Baseline:  Goal status: IN PROGRESS 6/12 improving but still some baseline balance issues goal continues to improve 7/23  3.  Patient will be able to walk community distances without any discomfort in left hip or feeling of lost balance .  Baseline:  Goal status: Mild pain in his hip but improving ability to ambulate in the community 7/23      PLAN:  PT FREQUENCY: 1-2x/week  PT DURATION: 10 weeks  PLANNED INTERVENTIONS: Therapeutic exercises, Therapeutic activity, Neuromuscular re-education, Balance training, Gait training,  Patient/Family education, Self Care, Joint mobilization, Stair training, Aquatic Therapy, Electrical stimulation, Cryotherapy, Moist heat, Taping, Ultrasound, Ionotophoresis 4mg /ml Dexamethasone, and Manual therapy  PLAN FOR NEXT SESSION: Consider TUG, 5 STS test, 6 minute walk, screen balance; adjust goals review HEP   Riki Altes, PTA  03/28/2023

## 2023-04-01 ENCOUNTER — Ambulatory Visit
Admission: RE | Admit: 2023-04-01 | Discharge: 2023-04-01 | Disposition: A | Discharge status: Home / Self Care | Payer: Medicare HMO | Source: Ambulatory Visit | Attending: Family Medicine | Admitting: Family Medicine

## 2023-04-01 ENCOUNTER — Ambulatory Visit: Payer: Medicare HMO

## 2023-04-01 ENCOUNTER — Ambulatory Visit (HOSPITAL_BASED_OUTPATIENT_CLINIC_OR_DEPARTMENT_OTHER): Payer: Medicare HMO

## 2023-04-01 VITALS — BP 175/94 | HR 64 | Temp 97.5°F | Resp 18 | Ht 73.0 in | Wt 265.0 lb

## 2023-04-01 DIAGNOSIS — M5136 Other intervertebral disc degeneration, lumbar region: Secondary | ICD-10-CM | POA: Diagnosis not present

## 2023-04-01 DIAGNOSIS — M545 Low back pain, unspecified: Secondary | ICD-10-CM

## 2023-04-01 DIAGNOSIS — M47816 Spondylosis without myelopathy or radiculopathy, lumbar region: Secondary | ICD-10-CM | POA: Diagnosis not present

## 2023-04-01 NOTE — ED Provider Notes (Signed)
[name]    CSN: [id number] Arrival date & time: 04/01/23  1025      History   Chief Complaint Chief Complaint  Patient presents with   Back Pain    Can barely walk. - Entered by patient    HPI Jason [name] is a 87 y.o. male.   Two days ago while arising from toilet, patient experienced sudden severe sharp non-radiating low midline back pain.  The pain has persisted and is worse when standing/walking.   He denies bowel or bladder dysfunction, and no saddle numbness.  The history is provided by the patient and the spouse.  Back Pain Location:  Lumbar spine Quality:  Stabbing Radiates to:  Does not radiate Pain severity:  Severe Pain is:  Worse during the day Onset quality:  Sudden Duration:  2 days Timing:  Sporadic Progression:  Improving Chronicity:  New Context: not falling, not lifting heavy objects, not recent illness and not recent injury   Relieved by:  Nothing Worsened by:  Ambulation and standing Ineffective treatments:  OTC medications Associated symptoms: no abdominal pain, no abdominal swelling, no bladder incontinence, no bowel incontinence, no dysuria, no fever, no numbness, no paresthesias, no pelvic pain, no perianal numbness, no tingling and no weakness     Past Medical History:  Diagnosis Date   Ascending aortic aneurysm (HCC) 01/22/2022   CAD in native artery 01/22/2022   Cancer (HCC)    Cataract    Diabetes mellitus without complication (HCC)    DM (diabetes mellitus) (HCC)    Gait instability 01/22/2022   Hypertension    Sleep apnea     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   Obstructive sleep apnea of adult 08/11/2022   Diabetic nephropathy with proteinuria (HCC) 08/11/2022   Obesity 08/11/2022   Hyperlipidemia 08/11/2022   Chronic kidney disease, stage 3a (HCC) 08/11/2022   Anxiety disorder, unspecified 08/11/2022   Diabetes mellitus (HCC) 08/11/2022   Primary malignant  neoplasm of prostate (HCC) 08/02/2022   Carcinoma of prostate (HCC) 08/02/2022   Shortness of breath 02/22/2022   Pure hypercholesterolemia 02/22/2022   Pain in joint, shoulder region 02/22/2022   Other malaise and fatigue 02/22/2022   Nonexudative age-related macular degeneration, bilateral, intermediate dry stage 02/22/2022   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   Gastroesophageal reflux disease 02/22/2022   Benign paroxysmal positional vertigo 02/22/2022   CAD in native artery 01/22/2022   Thoracic aortic aneurysm, without rupture, unspecified (HCC) 01/22/2022   Gait instability 01/22/2022   Essential hypertension 01/17/2022   Type 2 diabetes mellitus without complications (HCC) 01/17/2022   HLD (hyperlipidemia) 01/17/2022   Obstructive sleep apnea 01/17/2022   Sensorineural hearing loss, bilateral 11/09/2021   Orthostatic hypotension 11/09/2021   Imbalance 11/09/2021   Overweight 02/21/2020   First degree heart block 02/21/2020   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   Malignant tumor of prostate (HCC) 02/21/2020   Vitamin D deficiency 05/31/2016   History of malignant neoplasm of prostate 05/31/2016   History of malignant neoplasm of skin 05/31/2016   Bilateral hearing loss 05/31/2016   Anxiety state 05/31/2016   Bronchospasm 09/24/2010   Cough 09/19/2010   Acute bronchitis 09/19/2010   Allergic rhinitis due to pollen 09/19/2010   Acute sinusitis 09/19/2010    History reviewed. No pertinent surgical history.     Home Medications    Prior to Admission medications   Medication Sig Start Date End Date Taking? Authorizing Provider  allopurinol (ZYLOPRIM) 300 MG  tablet Take 1 tablet (300 mg total) by mouth daily. 01/26/23  Yes Shade Flood, MD  aspirin EC 81 MG tablet Take 1 tablet every day by oral route. 02/21/20  Yes [provider]  atorvastatin (LIPITOR) 40 MG tablet Take 1 tablet (40 mg total) by mouth daily. 01/26/23  Yes Shade Flood, MD  b complex vitamins  capsule Take 1 capsule by mouth daily.   Yes [provider]  fluticasone (FLONASE) 50 MCG/ACT nasal spray Place 1-2 sprays into both nostrils daily. 01/26/23  Yes Shade Flood, MD  glimepiride (AMARYL) 1 MG tablet Take 1 tablet (1 mg total) by mouth daily with breakfast. 01/26/23  Yes Shade Flood, MD  glucose blood test strip E11.9 Use to test blood sugar twice daily or as needed 08/12/22  Yes Pavero, Cristal Deer, [facility]  Lancet Device MISC Dispense new lancet device based on patient insurance and current one touch delica products 01/26/23  Yes Shade Flood, MD  losartan (COZAAR) 25 MG tablet Take 1 tablet (25 mg total) by mouth at bedtime. 01/27/23  Yes Duke Salvia, MD  metFORMIN (GLUCOPHAGE) 500 MG tablet TAKE 2 TABLETS (1,000 MG TOTAL) BY MOUTH 2 (TWO) TIMES DAILY WITH A MEAL. 02/28/23  Yes Shade Flood, MD  metoprolol tartrate (LOPRESSOR) 25 MG tablet Take 12.5 mg by mouth 2 (two) times daily. 1/2 tablet 2 times daily 02/22/22  Yes Chilton Si, MD  midodrine (PROAMATINE) 10 MG tablet TAKE 1 TABLET BEFORE GETTING OUT OF BED OR SOON AFTER, 1 TABLET AT 12PM, 1 TABLET AROUND 4PM 02/22/23  Yes Chilton Si, MD  omeprazole (PRILOSEC) 20 MG capsule Take 1 capsule (20 mg total) by mouth daily. 01/26/23  Yes Shade Flood, MD  OneTouch [employer] Lancets 33G MISC E11.9 Use to test blood sugar twice daily or as needed 08/12/22  Yes Pavero, Cristal Deer, RPH  pioglitazone (ACTOS) 15 MG tablet Take 1 tablet (15 mg total) by mouth daily. 01/26/23  Yes Shade Flood, MD  venlafaxine (EFFEXOR) 75 MG tablet Take 1 tablet (75 mg total) by mouth daily. 01/26/23  Yes Shade Flood, MD  Multiple Vitamins-Minerals (ICAPS AREDS 2 PO) TAKE 1 CAPSULE BY MOUTH TWICE A DAY FOR MACULAR DEGENERATION 08/05/22   [provider]    Family History Family History  Problem Relation Age of Onset   Heart attack Mother    Cancer Father    Cancer Sister     Social  History Social History   Tobacco Use   Smoking status: Former    Types: Pipe   Smokeless tobacco: Never  Vaping Use   Vaping status: Never Used  Substance Use Topics   Alcohol use: Yes    Alcohol/week: 2.0 standard drinks of alcohol    Types: 2 Glasses of wine per week   Drug use: Never     Allergies   Lisinopril   Review of Systems Review of Systems  Constitutional:  Positive for activity change. Negative for appetite change, chills, diaphoresis, fatigue and fever.  Gastrointestinal:  Negative for abdominal pain and bowel incontinence.  Genitourinary:  Negative for bladder incontinence, dysuria and pelvic pain.  Musculoskeletal:  Positive for back pain.  Skin:  Negative for rash.  Neurological:  Negative for tingling, weakness, numbness and paresthesias.  All other systems reviewed and are negative.    Physical Exam Triage Vital Signs ED Triage Vitals  Encounter Vitals Group     BP 04/01/23 1057 (!) 175/94  Systolic BP Percentile --      Diastolic BP Percentile --      Pulse Rate 04/01/23 1057 64     Resp 04/01/23 1057 18     Temp 04/01/23 1057 (!) 97.5 F (36.4 C)     Temp Source 04/01/23 1057 Oral     SpO2 04/01/23 1057 95 %     Weight 04/01/23 1059 265 lb (120.2 kg)     Height 04/01/23 1059 6\' 1"  (1.854 m)     Head Circumference --      Peak Flow --      Pain Score 04/01/23 1058 8     Pain Loc --      Pain Education --      Exclude from Growth Chart --    No data found.  Updated Vital Signs BP (!) 175/94 (BP Location: Left Arm)   Pulse 64   Temp (!) 97.5 F (36.4 C) (Oral)   Resp 18   Ht 6\' 1"  (1.854 m)   Wt 120.2 kg   SpO2 95%   BMI 34.96 kg/m   Visual Acuity Right Eye Distance:   Left Eye Distance:   Bilateral Distance:    Right Eye Near:   Left Eye Near:    Bilateral Near:     Physical Exam Vitals and nursing note reviewed.  Constitutional:      General: He is not in acute distress. HENT:     Head: Normocephalic.  Eyes:      Pupils: Pupils are equal, round, and reactive to light.  Cardiovascular:     Rate and Rhythm: Normal rate and regular rhythm.     Heart sounds: Normal heart sounds.  Pulmonary:     Breath sounds: Normal breath sounds.  Abdominal:     Palpations: Abdomen is soft.     Tenderness: There is no abdominal tenderness.  Musculoskeletal:     Cervical back: Normal range of motion.       Back:     Right lower leg: No edema.     Left lower leg: No edema.     Comments: Back:  Decreased forward flexion.  Tenderness in the midline from L3 to L5..  Straight leg raising test is negative.  Sitting knee extension test is negative.  Strength and sensation in the lower extremities is normal.  Patellar and achilles reflexes are normal.   Skin:    General: Skin is warm and dry.     Findings: No rash.  Neurological:     General: No focal deficit present.     Mental Status: He is alert.     UC Treatments / Results  Labs (all labs ordered are listed, but only abnormal results are displayed) Labs Reviewed - No data to display  EKG   Radiology DG Lumbar Spine Complete  Result Date: 04/01/2023 CLINICAL DATA:  Low back pain for 3 days EXAM: LUMBAR SPINE - COMPLETE 4+ VIEW COMPARISON:  None Available. FINDINGS: 20% superior endplate compression fracture at L2 is likely chronic given the lack of sclerosis in the L2 vertebral body. Lumbar spondylosis multilevel mild posterior interbody spurring. Mild loss of intervertebral disc height at L1-2 and L2-3 as well as L5-S1. Degenerative facet arthropathy bilaterally at L4-5 and L5-S1. No subluxation. Atherosclerosis is present, including aortoiliac atherosclerotic disease. Right upper quadrant clips from prior cholecystectomy. IMPRESSION: 1. 20% superior endplate compression fracture at L2 is likely chronic given the lack of sclerosis in the L2 vertebral body. 2. Lumbar spondylosis  and degenerative disc disease. 3. Aortoiliac atherosclerosis. Electronically Signed    By: Gaylyn Rong M.D.   On: 04/01/2023 12:52    Procedures Procedures (including critical care time)  Medications Ordered in UC Medications - No data to display  Initial Impression / Assessment and Plan / UC Course  I have reviewed the triage vital signs and the nursing notes.  Pertinent labs & imaging results that were available during my care of the patient were reviewed by me and considered in my medical decision making (see chart for details).    Note LS spine x-ray finding of apparent chronic endplate compression fracture at L2. Suspect exacerbation of this pre-existing compression fracture.  Treat conservatively with acetaminophen.  Recommend follow-up with PCP for osteoporosis management.   Final Clinical Impressions(s) / UC Diagnoses   Final diagnoses:  Acute midline low back pain without sciatica     Discharge Instructions      Recommend taking Tylenol 500mg , two tabs every 6 to 8 hours as needed for pain. Recommend beginning calcium and vitamin D supplements (discuss with your family doctor).  If symptoms become significantly worse during the night or over the weekend, proceed to the local emergency room.      ED Prescriptions   None       Lattie Haw, MD 04/02/23 1943

## 2023-04-01 NOTE — ED Triage Notes (Signed)
Patient c/o pain in the middle of his back x 2 days ago.  The pain started after getting up off of the toilet.  Patient has been taken ES Tylenol for the pain.  No apparent injury.

## 2023-04-01 NOTE — Discharge Instructions (Signed)
Recommend taking Tylenol 500mg , two tabs every 6 to 8 hours as needed for pain. Recommend beginning calcium and vitamin D supplements (discuss with your family doctor).  If symptoms become significantly worse during the night or over the weekend, proceed to the local emergency room.

## 2023-04-05 ENCOUNTER — Encounter (HOSPITAL_BASED_OUTPATIENT_CLINIC_OR_DEPARTMENT_OTHER): Payer: Self-pay

## 2023-04-05 ENCOUNTER — Ambulatory Visit (HOSPITAL_BASED_OUTPATIENT_CLINIC_OR_DEPARTMENT_OTHER): Payer: Medicare HMO | Admitting: Physical Therapy

## 2023-04-07 ENCOUNTER — Ambulatory Visit (HOSPITAL_BASED_OUTPATIENT_CLINIC_OR_DEPARTMENT_OTHER): Payer: Medicare HMO

## 2023-04-08 ENCOUNTER — Encounter: Payer: Self-pay | Admitting: Family Medicine

## 2023-04-08 ENCOUNTER — Other Ambulatory Visit: Payer: Self-pay | Admitting: Family Medicine

## 2023-04-08 ENCOUNTER — Ambulatory Visit (INDEPENDENT_AMBULATORY_CARE_PROVIDER_SITE_OTHER): Payer: Medicare HMO | Admitting: Family Medicine

## 2023-04-08 VITALS — BP 140/79 | HR 69 | Temp 97.4°F | Ht 71.0 in | Wt 274.4 lb

## 2023-04-08 DIAGNOSIS — E119 Type 2 diabetes mellitus without complications: Secondary | ICD-10-CM

## 2023-04-08 DIAGNOSIS — S32020D Wedge compression fracture of second lumbar vertebra, subsequent encounter for fracture with routine healing: Secondary | ICD-10-CM | POA: Diagnosis not present

## 2023-04-08 DIAGNOSIS — I10 Essential (primary) hypertension: Secondary | ICD-10-CM | POA: Diagnosis not present

## 2023-04-08 DIAGNOSIS — R42 Dizziness and giddiness: Secondary | ICD-10-CM | POA: Diagnosis not present

## 2023-04-08 DIAGNOSIS — E669 Obesity, unspecified: Secondary | ICD-10-CM | POA: Diagnosis not present

## 2023-04-08 DIAGNOSIS — J0181 Other acute recurrent sinusitis: Secondary | ICD-10-CM

## 2023-04-08 DIAGNOSIS — J310 Chronic rhinitis: Secondary | ICD-10-CM | POA: Diagnosis not present

## 2023-04-08 DIAGNOSIS — Z7984 Long term (current) use of oral hypoglycemic drugs: Secondary | ICD-10-CM

## 2023-04-08 DIAGNOSIS — E66812 Obesity, class 2: Secondary | ICD-10-CM

## 2023-04-08 DIAGNOSIS — E1169 Type 2 diabetes mellitus with other specified complication: Secondary | ICD-10-CM | POA: Diagnosis not present

## 2023-04-08 DIAGNOSIS — Z0000 Encounter for general adult medical examination without abnormal findings: Secondary | ICD-10-CM

## 2023-04-08 MED ORDER — METFORMIN HCL 500 MG PO TABS
ORAL_TABLET | ORAL | Status: DC
Start: 2023-04-08 — End: 2023-06-01

## 2023-04-08 MED ORDER — TIRZEPATIDE 2.5 MG/0.5ML ~~LOC~~ SOAJ: 2.5000 mg | SUBCUTANEOUS | 0 refills | Status: DC

## 2023-04-08 MED ORDER — IPRATROPIUM BROMIDE 0.03 % NA SOLN: 2.0000 | Freq: Two times a day (BID) | NASAL | 6 refills | Status: DC

## 2023-04-08 NOTE — Progress Notes (Signed)
Office Note 04/08/2023  CC:  Chief Complaint  Patient presents with   Establish Care    Transfer of care; pt would like to discuss back pain, weight loss, wheezing and urinary incontinence.    HPI:  Jason [name] is a 87 y.o. male who is here to establish/transfer care from [facility], Dr. Neva Seat. Old records were reviewed prior to or during today's visit.  Has orthostatic dizziness and unsteady gait.  Has fallen several times, most recently hurting his back.  He went to the emergency department where radiographs showed old/chronic L2 endplate fracture, 20%. He has been taking Tylenol and it is getting better. Due to his poor mobility he is unable to exercise and burn any significant amount of calories.  He feels like he has a good diabetic diet. He asks about something to help him lose weight.  He has a chronic problem with getting runny nose and sneezing when he starts eating.   Past Medical History:  Diagnosis Date   Anxiety and depression    Ascending aortic aneurysm (HCC) 01/22/2022   CAD in native artery 01/22/2022   Chronic renal insufficiency, stage 3 (moderate) (HCC)    Diabetes mellitus without complication (HCC)    Gait instability 01/22/2022   GERD (gastroesophageal reflux disease)    Gout    Hypercholesterolemia    Hypertension    Lumbar compression fracture (HCC)    L2, 20%, chronic   Orthostatic hypotension    OSA (obstructive sleep apnea)    Osteoarthritis of left hip    + troch burs   Prostate cancer (HCC)    1990-->prostatectomy, released from urol x many years   Urinary incontinence     Past Surgical History:  Procedure Laterality Date   CHOLECYSTECTOMY, LAPAROSCOPIC     1992   ROBOT ASSISTED LAPAROSCOPIC RADICAL PROSTATECTOMY     1999   ROTATOR CUFF REPAIR     Left 1991, right 1997   TRANSTHORACIC ECHOCARDIOGRAM     11/24/22, EF nl, +LVH with no WMA, grd I DD, valves ok (no amyloid changes)    Family History  Problem Relation Age of  Onset   Heart attack Mother    Cancer Father    Cancer Sister     Social History   Socioeconomic History   Marital status: Married    Spouse name: Not on file   Number of children: Not on file   Years of education: Not on file   Highest education level: Doctorate  Occupational History   Not on file  Tobacco Use   Smoking status: Former    Types: Pipe   Smokeless tobacco: Never  Vaping Use   Vaping status: Never Used  Substance and Sexual Activity   Alcohol use: Yes    Alcohol/week: 2.0 standard drinks of alcohol    Types: 2 Glasses of wine per week   Drug use: Never   Sexual activity: Not Currently  Other Topics Concern   Not on file  Social History Narrative   Married, 3 sons and 2 daughters.   Originally from [place].   Retired from [employer].   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   No tob.  Occ alc   Social Determinants of Health   Financial Resource Strain: Medium Risk (01/25/2023)   Overall Financial Resource Strain (CARDIA)    Difficulty of Paying Living Expenses: Somewhat hard  Food Insecurity: Patient Declined (01/25/2023)   Hunger Vital Sign    Worried About Programme researcher, broadcasting/film/video  in the Last Year: Patient declined    Ran Out of Food in the Last Year: Patient declined  Transportation Needs: No Transportation Needs (01/25/2023)   PRAPARE - Administrator, Civil Service (Medical): No    Lack of Transportation (Non-Medical): No  Physical Activity: Inactive (01/25/2023)   Exercise Vital Sign    Days of Exercise per Week: 0 days    Minutes of Exercise per Session: 0 min  Stress: No Stress Concern Present (01/25/2023)   [employer] of Occupational Health - Occupational Stress Questionnaire    Feeling of Stress : Only a little  Social Connections: Unknown (01/25/2023)   Social Connection and Isolation Panel [NHANES]    Frequency of Communication with Friends and Family: Once a week    Frequency of Social Gatherings with Friends and  Family: Twice a week    Attends Religious Services: Patient declined    Database administrator or Organizations: No    Attends Engineer, structural: Never    Marital Status: Married  Recent Concern: Social Connections - Moderately Isolated (12/23/2022)   Social Connection and Isolation Panel [NHANES]    Frequency of Communication with Friends and Family: More than three times a week    Frequency of Social Gatherings with Friends and Family: Once a week    Attends Religious Services: Never    Database administrator or Organizations: No    Attends Banker Meetings: Never    Marital Status: Married  Catering manager Violence: Not At Risk (12/23/2022)   Humiliation, Afraid, Rape, and Kick questionnaire    Fear of Current or Ex-Partner: No    Emotionally Abused: No    Physically Abused: No    Sexually Abused: No    Outpatient Encounter Medications as of 04/08/2023  Medication Sig   allopurinol (ZYLOPRIM) 300 MG tablet Take 1 tablet (300 mg total) by mouth daily.   aspirin EC 81 MG tablet Take 1 tablet every day by oral route.   atorvastatin (LIPITOR) 40 MG tablet Take 1 tablet (40 mg total) by mouth daily.   b complex vitamins capsule Take 1 capsule by mouth daily.   fluticasone (FLONASE) 50 MCG/ACT nasal spray Place 1-2 sprays into both nostrils daily.   glimepiride (AMARYL) 1 MG tablet Take 1 tablet (1 mg total) by mouth daily with breakfast.   glucose blood test strip E11.9 Use to test blood sugar twice daily or as needed   Lancet Device MISC Dispense new lancet device based on patient insurance and current one touch delica products   losartan (COZAAR) 25 MG tablet Take 1 tablet (25 mg total) by mouth at bedtime.   metFORMIN (GLUCOPHAGE) 500 MG tablet TAKE 2 TABLETS (1,000 MG TOTAL) BY MOUTH 2 (TWO) TIMES DAILY WITH A MEAL.   metoprolol tartrate (LOPRESSOR) 25 MG tablet Take 12.5 mg by mouth 2 (two) times daily. 1/2 tablet 2 times daily   midodrine (PROAMATINE) 10 MG  tablet TAKE 1 TABLET BEFORE GETTING OUT OF BED OR SOON AFTER, 1 TABLET AT 12PM, 1 TABLET AROUND 4PM (Patient taking differently: TAKE 1 TABLET BEFORE GETTING OUT OF BED OR SOON AFTER, 1 TABLET AT 12PM.)   Multiple Vitamins-Minerals (ICAPS AREDS 2 PO) TAKE 1 CAPSULE BY MOUTH TWICE A DAY FOR MACULAR DEGENERATION   omeprazole (PRILOSEC) 20 MG capsule Take 1 capsule (20 mg total) by mouth daily.   OneTouch Delica Lancets 33G MISC E11.9 Use to test blood sugar twice daily or as needed  pioglitazone (ACTOS) 15 MG tablet Take 1 tablet (15 mg total) by mouth daily.   tirzepatide [facility]) 2.5 MG/0.5ML Pen Inject 2.5 mg into the skin once a week.   venlafaxine (EFFEXOR) 75 MG tablet Take 1 tablet (75 mg total) by mouth daily.   [DISCONTINUED] ipratropium (ATROVENT) 0.03 % nasal spray Place 2 sprays into both nostrils every 12 (twelve) hours. Use 15 to 30 min prior to breakfast and supper   No facility-administered encounter medications on file as of 04/08/2023.    Allergies  Allergen Reactions   Lisinopril Cough, Swelling and Other (See Comments)    Review of Systems  Constitutional:  Negative for appetite change, chills, fatigue and fever.  HENT:  Positive for rhinorrhea. Negative for congestion, dental problem, ear pain and sore throat.   Eyes:  Negative for discharge, redness and visual disturbance.  Respiratory:  Negative for cough, chest tightness, shortness of breath and wheezing.   Cardiovascular:  Negative for chest pain, palpitations and leg swelling.  Gastrointestinal:  Negative for abdominal pain, blood in stool, diarrhea, nausea and vomiting.  Genitourinary:  Negative for difficulty urinating, dysuria, flank pain, frequency, hematuria and urgency.  Musculoskeletal:  Positive for back pain. Negative for arthralgias, joint swelling, myalgias and neck stiffness.  Skin:  Negative for pallor and rash.  Neurological:  Positive for dizziness. Negative for speech difficulty, weakness and  headaches.  Hematological:  Negative for adenopathy. Does not bruise/bleed easily.  Psychiatric/Behavioral:  Negative for confusion and sleep disturbance. The patient is not nervous/anxious.     PE; Blood pressure (!) 140/79, pulse 69, temperature (!) 97.4 F (36.3 C), height 5\' 11"  (1.803 m), weight 274 lb 6.4 oz (124.5 kg), SpO2 97%. Body mass index is 38.27 kg/m.  Physical Exam  Gen: Alert, well appearing.  Patient is oriented to person, place, time, and situation. AFFECT: pleasant, lucid thought and speech. ENT: Ears: EACs clear, normal epithelium.  TMs with good light reflex and landmarks bilaterally.  Eyes: no injection, icteris, swelling, or exudate.  EOMI, PERRLA. Nose: no drainage or turbinate edema/swelling.  No injection or focal lesion.  Mouth: lips without lesion/swelling.  Oral mucosa pink and moist.  Dentition intact and without obvious caries or gingival swelling.  Oropharynx without erythema, exudate, or swelling.  Neck: supple/nontender.  No LAD, mass, or TM.  Carotid pulses 2+ bilaterally, without bruits. CV: RRR, no m/r/g.   LUNGS: CTA bilat, nonlabored resps, good aeration in all lung fields. ABD: soft, NT, ND, BS normal.  No hepatospenomegaly or mass.  No bruits. EXT: no clubbing, cyanosis, or edema.  Musculoskeletal: no joint swelling, erythema, warmth, or tenderness.  ROM of all joints intact. Skin - no sores or suspicious lesions or rashes or color changes Foot exam -  no swelling, tenderness or skin or vascular lesions. Color and temperature is normal. Sensation is intact. Peripheral pulses are palpable. Toenails are normal.  Pertinent labs:  Last CBC Lab Results  Component Value Date   WBC 8.9 04/12/2022   HGB 13.0 04/12/2022   HCT 39.3 04/12/2022   MCV 90 04/12/2022   MCH 29.7 04/12/2022   RDW 13.1 04/12/2022   PLT 275 04/12/2022   Last metabolic panel Lab Results  Component Value Date   GLUCOSE 194 (H) 01/26/2023   NA 136 01/26/2023   K 4.5  01/26/2023   CL 102 01/26/2023   CO2 26 01/26/2023   BUN 19 01/26/2023   CREATININE 1.28 01/26/2023   GFR 51.19 (L) 01/26/2023   CALCIUM 9.3  01/26/2023   PROT 7.5 01/26/2023   ALBUMIN 4.1 01/26/2023   BILITOT 0.3 01/26/2023   ALKPHOS 73 01/26/2023   AST 25 01/26/2023   ALT 18 01/26/2023   Last lipids Lab Results  Component Value Date   CHOL 156 10/25/2022   HDL 47.90 10/25/2022   LDLCALC 82 10/25/2022   TRIG 131.0 10/25/2022   CHOLHDL 3 10/25/2022   Last hemoglobin A1c Lab Results  Component Value Date   HGBA1C 7.2 (H) 01/26/2023   Last thyroid functions Lab Results  Component Value Date   TSH 3.410 04/12/2022   T4TOTAL 7.1 04/12/2022   Last vitamin B12 and Folate Lab Results  Component Value Date   VITAMINB12 422 04/12/2022   FOLATE 18.5 04/12/2022   ASSESSMENT AND PLAN:   New patient, transferring care.  1.  Diabetes without complication. Fairly well-controlled. Most recent hemoglobin A1c about 2 months ago was 7.2%. Start Mounjaro 2.5 mg SQ weekly.  Continue pioglitazone 15mg  every day. Decrease metformin to 500 mg twice a day and stop glimepiride. Next A1c after 04/28/2023. Feet exam normal today.  2. Health maintenance exam: Reviewed age and gender appropriate health maintenance issues (prudent diet, regular exercise, health risks of tobacco and excessive alcohol, use of seatbelts, fire alarms in home, use of sunscreen).  Also reviewed age and gender appropriate health screening as well as vaccine recommendations. Vaccines: All up-to-date Labs:cmet,cbc,lipid and hemoglobin A1c-->at f/u in 1 mo Prostate ca screening: History of prostate cancer.  PSA to be done with fasting labs at next follow-up in 1 month. Colon ca screening: No further screening is indicated due to age.  #3 obesity, BMI 38. See #1 above--start Mounjaro.  4.  Gustatory rhinitis. Start ipratropium nasal spray every 12 hours--- prescribed today.  5.  Orthostatic hypotension in the  setting of chronic hypertension. He is getting good results with use of midodrine. He is getting good benefit from physical therapy.  Uses a cane.  #6 lumbar compression fracture. DEXA ordered today. Discussed calcium and vitamin D supplementation today. Will start additional osteoporosis medication once DEXA result is back.  An After Visit Summary was printed and given to the patient.  Return in about 4 weeks (around 05/06/2023) for f/u wt mgmt,DM.  Signed:  Santiago Bumpers, MD           04/08/2023

## 2023-04-12 ENCOUNTER — Ambulatory Visit (HOSPITAL_BASED_OUTPATIENT_CLINIC_OR_DEPARTMENT_OTHER): Payer: Medicare HMO | Admitting: Physical Therapy

## 2023-04-12 ENCOUNTER — Encounter (HOSPITAL_BASED_OUTPATIENT_CLINIC_OR_DEPARTMENT_OTHER): Payer: Self-pay

## 2023-04-14 ENCOUNTER — Ambulatory Visit (HOSPITAL_BASED_OUTPATIENT_CLINIC_OR_DEPARTMENT_OTHER): Payer: Medicare HMO

## 2023-04-19 ENCOUNTER — Encounter (HOSPITAL_BASED_OUTPATIENT_CLINIC_OR_DEPARTMENT_OTHER): Payer: Self-pay

## 2023-04-19 ENCOUNTER — Telehealth: Payer: Self-pay | Admitting: Family Medicine

## 2023-04-19 ENCOUNTER — Ambulatory Visit (HOSPITAL_BASED_OUTPATIENT_CLINIC_OR_DEPARTMENT_OTHER): Payer: Medicare HMO

## 2023-04-19 NOTE — Telephone Encounter (Signed)
Okay. Restart glimepiride 4 mg every morning. If glucoses not coming down to around 130 average in 3 days then increase metformin to 1000 mg twice a day. Keep appointment set with me for 05/06/2023.

## 2023-04-19 NOTE — Telephone Encounter (Signed)
Please advise   [facility] Day - Client TELEPHONE ADVICE RECORD [facility] Patient Name First: Jason Last: [name] Gender: Male DOB: [birth year] Age: 87 Y 3 M 21 D Return Phone Number: [phone] (Primary) Address: City/ State/ Zip: [place]  [address] Client [facility] Day - Client Client Site [facility] - Day Provider Santiago Bumpers - MD Contact Type Call Who Is Calling Patient / Member / Family / Caregiver Call Type Triage / Clinical Caller Name Karena Addison from the office Relationship To Patient Other Return Phone Number [phone] (Primary) Chief Complaint Blood Sugar High Reason for Call Symptomatic / Request for Health Information Initial Comment Caller states pts blood sugar is 198. Over the weekend it got to almost 600. Translation No Nurse Assessment Nurse: Roxan Hockey, RN, Imari Date/Time (Eastern Time): 04/19/2023 8:59:59 AM Confirm and document reason for call. If symptomatic, describe symptoms. ---Caller state that his blood sugar was 190-198 over the weekend. He states he was taken off of glimepiride. He states he is taking mounjaro. He states he is having frequent urination and increased thirst. He states his blood sugar normal runs at 130 Does the patient have any new or worsening symptoms? ---Yes Will a triage be completed? ---Yes Related visit to physician within the last 2 weeks? ---Yes Does the PT have any chronic conditions? (i.e. diabetes, asthma, this includes High risk factors for pregnancy, etc.) ---Yes List chronic conditions. ---DM, Is this a behavioral health or substance abuse call? ---No Guidelines Guideline Title Affirmed Question Affirmed Notes Nurse Date/Time (Eastern Time) Diabetes - High Blood Sugar Blood glucose 70-240 mg/dL (3.9 -13.0 mmol/L) Roxan Hockey, RN, Imari 04/19/2023 9:03:20 AM Disp. Time Lamount Cohen Time) Disposition Final User 04/19/2023 9:06:06 AM Home Care Yes  Roxan Hockey, RN, Imari PLEASE NOTE: All timestamps contained within this report are represented as [place] Standard Time. CONFIDENTIALTY NOTICE: This fax transmission is intended only for the addressee. It contains information that is legally privileged, confidential or otherwise protected from use or disclosure. If you are not the intended recipient, you are strictly prohibited from reviewing, disclosing, copying using or disseminating any of this information or taking any action in reliance on or regarding this information. If you have received this fax in error, please notify us immediately by telephone so that we can arrange for its return to [place]. Phone: [phone], Toll-Free: [phone], Fax: [phone] Page: 2 of 2 Call Id: [id number] Final Disposition 04/19/2023 9:06:06 AM Home Care Yes Roxan Hockey, RN, Imari Caller Disagree/Comply Comply Caller Understands Yes PreDisposition Call Doctor Care Advice Given Per Guideline HOME CARE: * You should be able to treat this at home. DAILY BLOOD GLUCOSE GOALS: * You and your doctor (or NP/ PA) should decide upon your blood glucose goals. Typical goals for most non-pregnant adults who perform daily finger-stick blood glucose testing at home are as follows. MEASURE AND RECORD YOUR BLOOD GLUCOSE: * Keep a log and show it to your doctor (or NP/PA) at your next office visit. EXPECTED COURSE: * You should call back in 3 to 5 days if any of the following happen. * Your blood sugar continues to get above 240 mg/dL (59.5 mmol/L). * Your blood sugar continues to be higher than your daily glucose goals set by you and your doctor (or NP/PA). * It has been longer than 6 months since you had a Hemoglobin A1C test. CALL BACK IF: * Blood glucose over 300 mg/dL (63.8 mmol/L) two or more times in a row *  You become worse CARE ADVICE given per Diabetes - High Blood Sugar (Adult) guideline. Comments User: Juel Burrow, RN Date/Time Lamount Cohen Time): 04/19/2023 9:08:56  AM Per office staff someone will contact patient regarding medication. Referrals REFERRED TO PCP OFFICE

## 2023-04-19 NOTE — Telephone Encounter (Signed)
Patient called and reports that since coming off of two medications his blood sugar has been running high. His reading this morning is 198 and he states over the past several days it has been running in the 600's. I sent the patient to Triage.

## 2023-04-19 NOTE — Telephone Encounter (Signed)
Spoke with patient regarding results/recommendations.  

## 2023-04-21 ENCOUNTER — Ambulatory Visit (HOSPITAL_BASED_OUTPATIENT_CLINIC_OR_DEPARTMENT_OTHER): Payer: Medicare HMO

## 2023-04-21 DIAGNOSIS — H3530 Unspecified macular degeneration: Secondary | ICD-10-CM | POA: Diagnosis not present

## 2023-04-25 ENCOUNTER — Other Ambulatory Visit: Payer: Self-pay | Admitting: Family Medicine

## 2023-04-25 ENCOUNTER — Encounter: Payer: Self-pay | Admitting: Family Medicine

## 2023-04-25 ENCOUNTER — Ambulatory Visit (HOSPITAL_BASED_OUTPATIENT_CLINIC_OR_DEPARTMENT_OTHER)
Admission: RE | Admit: 2023-04-25 | Discharge: 2023-04-25 | Disposition: A | Discharge status: Home / Self Care | Payer: Medicare HMO | Source: Ambulatory Visit | Attending: Family Medicine | Admitting: Family Medicine

## 2023-04-25 DIAGNOSIS — E1122 Type 2 diabetes mellitus with diabetic chronic kidney disease: Secondary | ICD-10-CM | POA: Diagnosis not present

## 2023-04-25 DIAGNOSIS — Z8781 Personal history of (healed) traumatic fracture: Secondary | ICD-10-CM

## 2023-04-25 DIAGNOSIS — S32020D Wedge compression fracture of second lumbar vertebra, subsequent encounter for fracture with routine healing: Secondary | ICD-10-CM | POA: Insufficient documentation

## 2023-04-25 DIAGNOSIS — E1159 Type 2 diabetes mellitus with other circulatory complications: Secondary | ICD-10-CM | POA: Diagnosis not present

## 2023-04-25 DIAGNOSIS — X58XXXA Exposure to other specified factors, initial encounter: Secondary | ICD-10-CM | POA: Diagnosis not present

## 2023-04-25 DIAGNOSIS — S32020A Wedge compression fracture of second lumbar vertebra, initial encounter for closed fracture: Secondary | ICD-10-CM | POA: Insufficient documentation

## 2023-04-25 DIAGNOSIS — N183 Chronic kidney disease, stage 3 unspecified: Secondary | ICD-10-CM | POA: Insufficient documentation

## 2023-04-25 DIAGNOSIS — M810 Age-related osteoporosis without current pathological fracture: Secondary | ICD-10-CM

## 2023-04-25 DIAGNOSIS — C61 Malignant neoplasm of prostate: Secondary | ICD-10-CM | POA: Insufficient documentation

## 2023-04-25 DIAGNOSIS — Z0389 Encounter for observation for other suspected diseases and conditions ruled out: Secondary | ICD-10-CM | POA: Diagnosis not present

## 2023-04-26 ENCOUNTER — Ambulatory Visit (HOSPITAL_BASED_OUTPATIENT_CLINIC_OR_DEPARTMENT_OTHER): Payer: Medicare HMO | Attending: Family Medicine | Admitting: Physical Therapy

## 2023-04-26 ENCOUNTER — Other Ambulatory Visit: Payer: Self-pay | Admitting: Family Medicine

## 2023-04-26 DIAGNOSIS — M545 Low back pain, unspecified: Secondary | ICD-10-CM | POA: Diagnosis not present

## 2023-04-26 DIAGNOSIS — M62830 Muscle spasm of back: Secondary | ICD-10-CM | POA: Diagnosis not present

## 2023-04-26 DIAGNOSIS — G8929 Other chronic pain: Secondary | ICD-10-CM | POA: Diagnosis not present

## 2023-04-26 DIAGNOSIS — R262 Difficulty in walking, not elsewhere classified: Secondary | ICD-10-CM | POA: Insufficient documentation

## 2023-04-26 DIAGNOSIS — M5459 Other low back pain: Secondary | ICD-10-CM | POA: Diagnosis not present

## 2023-04-26 DIAGNOSIS — R42 Dizziness and giddiness: Secondary | ICD-10-CM | POA: Diagnosis not present

## 2023-04-26 DIAGNOSIS — R2681 Unsteadiness on feet: Secondary | ICD-10-CM | POA: Diagnosis not present

## 2023-04-26 NOTE — Therapy (Signed)
OUTPATIENT PHYSICAL THERAPY LOWER EXTREMITY Treatment    Patient Name: Jason [name] MRN: [id number] DOB:1935-10-28, 87 y.o., male Today's Date: 04/26/2023  END OF SESSION:         Past Medical History:  Diagnosis Date   Anxiety and depression    Ascending aortic aneurysm (HCC) 01/22/2022   CAD in native artery 01/22/2022   Chronic renal insufficiency, stage 3 (moderate) (HCC)    Diabetes mellitus without complication (HCC)    Gait instability 01/22/2022   GERD (gastroesophageal reflux disease)    Gout    Hypercholesterolemia    Hypertension    Lumbar compression fracture (HCC)    L2, 20%, chronic (imaging 2024).  DEXA -1.0 04/2023   Orthostatic hypotension    OSA (obstructive sleep apnea)    Osteoarthritis of left hip    + troch burs   Prostate cancer (HCC)    1990-->prostatectomy, released from urol x many years   Urinary incontinence    Past Surgical History:  Procedure Laterality Date   CHOLECYSTECTOMY, LAPAROSCOPIC     1992   DEXA     T score -1.0 Sept 2024   ROBOT ASSISTED LAPAROSCOPIC RADICAL PROSTATECTOMY     1999   ROTATOR CUFF REPAIR     Left 1991, right 1997   TOTAL KNEE ARTHROPLASTY     bilat 2004   TRANSTHORACIC ECHOCARDIOGRAM     11/24/22, EF nl, +LVH with no WMA, grd I DD, valves ok (no amyloid changes)   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   Obstructive sleep apnea of adult 08/11/2022   Diabetic nephropathy with proteinuria (HCC) 08/11/2022   Obesity 08/11/2022   Hyperlipidemia 08/11/2022   Chronic kidney disease, stage 3a (HCC) 08/11/2022   Anxiety disorder, unspecified 08/11/2022   Diabetes mellitus (HCC) 08/11/2022   Primary malignant neoplasm of prostate (HCC) 08/02/2022   Carcinoma of prostate (HCC) 08/02/2022   Shortness of breath 02/22/2022   Pure hypercholesterolemia 02/22/2022   Pain in joint, shoulder region 02/22/2022   Other malaise and fatigue 02/22/2022   Nonexudative  age-related macular degeneration, bilateral, intermediate dry stage 02/22/2022   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   Gastroesophageal reflux disease 02/22/2022   Benign paroxysmal positional vertigo 02/22/2022   CAD in native artery 01/22/2022   Thoracic aortic aneurysm, without rupture, unspecified (HCC) 01/22/2022   Gait instability 01/22/2022   Essential hypertension 01/17/2022   Type 2 diabetes mellitus without complications (HCC) 01/17/2022   HLD (hyperlipidemia) 01/17/2022   Obstructive sleep apnea 01/17/2022   Sensorineural hearing loss, bilateral 11/09/2021   Orthostatic hypotension 11/09/2021   Imbalance 11/09/2021   Overweight 02/21/2020   First degree heart block 02/21/2020   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   Malignant tumor of prostate (HCC) 02/21/2020   Vitamin D deficiency 05/31/2016   History of malignant neoplasm of prostate 05/31/2016   History of malignant neoplasm of skin 05/31/2016   Bilateral hearing loss 05/31/2016   Anxiety state 05/31/2016   Bronchospasm 09/24/2010   Cough 09/19/2010   Acute bronchitis 09/19/2010   Allergic rhinitis due to pollen 09/19/2010   Acute sinusitis 09/19/2010    PCP: Meredith Staggers, MD  REFERRING PROVIDER: Meredith Staggers, MD  REFERRING DIAG: R26.81 (ICD-10-CM) - Unsteadiness   THERAPY DIAG:  No diagnosis found.  Rationale for Evaluation and Treatment: Rehabilitation  ONSET DATE: march 2024  SUBJECTIVE:   SUBJECTIVE STATEMENT: The patient comes in following a month off 2nd to low back pain The patient had an acute  onset of lower back pain on 04/01/2023. He does remember a fall around that time, otherwise he can not remember anything. The back has improved but he is still in pain. The pain is more on the left side. The pain doesn't radiate down his legs at this time. He is scheduled to see a neuro surgeon at some point soon. He feels it the most in the morning. He was on tylenol every 6 hours but he has been off of it the past  few days.    PERTINENT HISTORY: Type 2 diabetes, sleep apnea, thoracic aortic aneurysm, orthorstatic hypotension  PAIN:  Are you having pain? No  PRECAUTIONS: None  WEIGHT BEARING RESTRICTIONS: No  FALLS:  Has patient fallen in last 6 months? Yes. Number of falls 2, step done on a small step. Another one reaching down and fell  LIVING ENVIRONMENT: Lives with: lives with their family Lives in: House/apartment Stairs: No Has following equipment at home: Single point cane  OCCUPATION: retired   PLOF: Independent  PATIENT GOALS: walking without pain, being more stable  NEXT MD VISIT: nothing scheduled   OBJECTIVE:      DIAGNOSTIC FINDINGS: nothing pertinent   PATIENT SURVEYS:  FOTO    COGNITION: Overall cognitive status: Within functional limits for tasks assessed     SENSATION: WFL  EDEMA:   MUSCLE LENGTH:   POSTURE: rounded shoulders  PALPATION:   LOWER EXTREMITY ROM:  Active ROM Right eval Left eval  Hip flexion    Hip extension    Hip abduction    Hip adduction    Hip internal rotation    Hip external rotation    Knee flexion    Knee extension    Ankle dorsiflexion    Ankle plantarflexion    Ankle inversion    Ankle eversion     (Blank rows = not tested)  LOWER EXTREMITY MMT:  MMT Right eval Left eval Right 6/11  Left  6/11 Right 7/23 Left 7/23  Hip flexion 19.5 19.3 painful 25.0 23.9 32.4 26.3  Hip extension        Hip abduction 22.5 27.1 42.1 41.2 46. 33.5  Hip adduction        Hip internal rotation        Hip external rotation        Knee flexion        Knee extension 27.4 33.2 45.1 42.5 45.4 52.3  Ankle dorsiflexion        Ankle plantarflexion        Ankle inversion        Ankle eversion         (Blank rows = not tested)  LOWER EXTREMITY SPECIAL TESTS:    5/1: Vitals 139/11mmHg 70bpm  4/26: Vitals: 166/108 at beginning Waited and took again: 168/96 Standing: 133/852 After seated exercise: 181/112 After  rest:165/108 At end of session: 167/104  EVAL: Vitals:  Patient Initial blood pressure seated was 165/118.  Patient blood pressure was retaken after a few minutes and was 171/103. Patient blood pressure was also taken standing, 134/78 FUNCTIONAL TESTS:  Eval:TUG test: 16sec- No AD 5xSTS: 27sec (used arms to push up)  6/11  5XSTS 19  TUG 17 without AD   6 min walk test: 2.5 minutes until for seated rest break seated rest break of 2 minutes.  After rest break able to ambulate until 5:30 mark at which time he needed a seated rest break for the rest of the test.  Total distance  527 feet.  7/23 5x STS 13  6 min walk test 875 no rest break  After walk test 185/105      GAIT: Patients uses a SPC to ambulate.  Balance:   6/11 Narrow base of support standby assist Narrow base of support eyes closed min assist Tandem stance min assist bilateral Single-leg stance unable to maintain  7/23 Narrow base of support standby assist Narrow base of support eyes closed min assist Tandem stance min assist bilateral Single-leg stance unable to maintain   TODAY'S TREATMENT:                                                                                                                              DATE:  9/10  B/P Baseline seated 130/94 Standing: 94/63   Seated:  Hip abduction 3x15  Seated march 2x15  Seated LAQ 3x10  All with a red band     8/12 Nu-step See assessment for BP measurements Ended session prematurely due to BP values and complaints of fatigue from pt.    8/6  Seated 186/100  Standing 146/87   Nu step 6 min L3   Heel/ Rock: 2x15 More difficulty controlling forward movement. Reports more difficulty going down hill and changing speeds   Gait: hallway 50'x2. With verbal commands to speed up and slow down. No loss of balance noted but minor fatigue  Standing slow march 2x15 with min UE support and CGA with gait belt.   7/30 Vitals: 174/84  seated  Standing 125/82  Nu-step L4 165/94  Narrow base eyes closed 2x30 seconds (light CGA) Sidestepping at rail x2 laps  177/102 166/99   7/25 Vitals :   172/100 seated   Standing:   150/82  LAQ 3x10 red bilateral  Hip abduction 3x10 bilateral  Seated march 3x10 bilateral  At this time the patient reported he was having more fatigue. He would like to go home and rest.   Nu-step 5 min    7/23 Vitals :   168/89   132/75  Testing for gait distance/ strength/ and balance   See objective for testing measures Reviewed HEP d    7/18: Start of session: 138/101 (seated) 95/76 (standing) Held of PT today due to drastic change in BP.    7/15: Start of session: 130/74 (seated) 120/78 (standing)  Nu-step L5 6 min  157/92 67bpm Waited then took again at 144/92  FT EC 30sec x2 Tandem balance 30seconds ea Marching in hall- 1/2 hall x2 Sit to stands without hands x10  167/99     7/11 Prior to session blood pressure: 105/65 seated 110/71 standing 118/59 standing  Nu-step L3 BP taken again at 128/81 seated Gait without SPC-365ft (CGA as a precaution) Sidestepping at rail- x2laps   Narrow base eyes closed 3x30 seconds (light CGA) Modified tandem stance 2x30 seconds ea   End of session 164/92 seated  116/72 standing  7/2 Prior to session: 138/76 (  seated) 79bpm  Standing: 112/78 81bpm NuStep 6 minutes level 5 156/94 after nu step 80bpm  Standing toe taps 2" box alternating- CGA no UE assist x20ea Hurdle step overs fwd/back (rail use and CGA) 2x10ea 164/93 82bpm 82bpm Gait in hall x42ft Sidestepping at rail- x1lap Lateral hurdle step overs x10ea (rail use and SBA) 157/96 80bpm   PATIENT EDUCATION:  Education details: POC, Symptom management, HEP Person educated: Patient Education method: Explanation, Demonstration, Tactile cues, Verbal cues, and Handouts Education comprehension: verbalized understanding,  returned demonstration, verbal cues required, tactile cues required, and needs further education  HOME EXERCISE PROGRAM: Access Code: [email or web address] URL: [email or web address] Date: 12/02/2022 Prepared by: Lorayne Bender  Exercises - Seated Hip Abduction with Resistance  - 1 x daily - 7 x weekly - 3 sets - 10 reps - Seated Knee Extension with Resistance  - 1 x daily - 7 x weekly - 3 sets - 10 reps - Seated Knee Lifts with Resistance  - 1 x daily - 7 x weekly - 3 sets - 10 reps  ASSESSMENT:  CLINICAL IMPRESSION: The patient comes in today after a long layoff 2nd to acute low back pain. His back has improved but it is still sore. He reports he has been feeling like his balance is off. We will have to do a re-assessment next visit. This visit we worked on light activity to see how his back responds. He was advised if it responds well we will continue If it does not we will put him on hold until he sees the [facility]. He tolerated light exercises well. He continues to have significant orthostatic hyper tension.   OBJECTIVE IMPAIRMENTS: decreased activity tolerance, decreased balance, decreased endurance, difficulty walking, decreased strength, and dizziness.   ACTIVITY LIMITATIONS: carrying, lifting, bending, standing, squatting, sleeping, and stairs  PARTICIPATION LIMITATIONS: meal prep, cleaning, laundry, driving, shopping, community activity, and yard work  PERSONAL FACTORS: 3+ comorbidities: type 2 diabetes, orthostatic hypotension, sleep apnea, thoracic aortic anuerysm  are also affecting patient's functional outcome.   REHAB POTENTIAL: Good  CLINICAL DECISION MAKING: Evolving/moderate complexity  EVALUATION COMPLEXITY: Moderate   GOALS: Goals reviewed with patient? Yes  SHORT TERM GOALS: Target date: 5/9 Patient will increase bilateral strength by 5lbs. Baseline: Goal status: Significant improvement in strength overall initial goal which she achieved 7/23 new goal  5 pounds from measurements on 7/23  2.  Patient will be independent with HEP. Baseline:  Goal status: Performing base HEP at home 6/12  3.  Patient will show minimal fluctuation and syncope with sit to stand transfer  Baseline:  Goal status: Minimal syncope.  Continues to have fluctuations in blood pressure  LONG TERM GOALS: Target date: 01/27/2023    Patient will transfer sit to stand without loss of balance or syncope  Baseline:  Goal status: MET 6/4  2.  Patient will perform daily activity with no loss of balance  Baseline:  Goal status: IN PROGRESS 6/12 improving but still some baseline balance issues goal continues to improve 7/23  3.  Patient will be able to walk community distances without any discomfort in left hip or feeling of lost balance .  Baseline:  Goal status: Mild pain in his hip but improving ability to ambulate in the community 7/23      PLAN:  PT FREQUENCY: 1-2x/week  PT DURATION: 10 weeks  PLANNED INTERVENTIONS: Therapeutic exercises, Therapeutic activity, Neuromuscular re-education, Balance training, Gait training, Patient/Family education, Self Care, Joint mobilization, Stair training, Aquatic Therapy, Electrical stimulation, Cryotherapy,  Moist heat, Taping, Ultrasound, Ionotophoresis 4mg /ml Dexamethasone, and Manual therapy  PLAN FOR NEXT SESSION: Consider TUG, 5 STS test, 6 minute walk, screen balance; adjust goals review HEP   Lorayne Bender PT DPT  03/28/2023

## 2023-04-28 ENCOUNTER — Ambulatory Visit (HOSPITAL_BASED_OUTPATIENT_CLINIC_OR_DEPARTMENT_OTHER): Payer: Medicare HMO | Admitting: Physical Therapy

## 2023-05-03 ENCOUNTER — Ambulatory Visit (HOSPITAL_BASED_OUTPATIENT_CLINIC_OR_DEPARTMENT_OTHER): Payer: Medicare HMO | Admitting: Physical Therapy

## 2023-05-03 ENCOUNTER — Encounter (HOSPITAL_BASED_OUTPATIENT_CLINIC_OR_DEPARTMENT_OTHER): Payer: Self-pay

## 2023-05-05 ENCOUNTER — Encounter (HOSPITAL_BASED_OUTPATIENT_CLINIC_OR_DEPARTMENT_OTHER): Payer: Self-pay | Admitting: Physical Therapy

## 2023-05-05 ENCOUNTER — Ambulatory Visit (HOSPITAL_BASED_OUTPATIENT_CLINIC_OR_DEPARTMENT_OTHER): Payer: Medicare HMO

## 2023-05-05 ENCOUNTER — Ambulatory Visit (HOSPITAL_BASED_OUTPATIENT_CLINIC_OR_DEPARTMENT_OTHER): Payer: Medicare HMO | Admitting: Physical Therapy

## 2023-05-05 DIAGNOSIS — M545 Low back pain, unspecified: Secondary | ICD-10-CM | POA: Diagnosis not present

## 2023-05-05 DIAGNOSIS — M5459 Other low back pain: Secondary | ICD-10-CM | POA: Diagnosis not present

## 2023-05-05 DIAGNOSIS — R262 Difficulty in walking, not elsewhere classified: Secondary | ICD-10-CM | POA: Diagnosis not present

## 2023-05-05 DIAGNOSIS — R42 Dizziness and giddiness: Secondary | ICD-10-CM | POA: Diagnosis not present

## 2023-05-05 DIAGNOSIS — G8929 Other chronic pain: Secondary | ICD-10-CM | POA: Diagnosis not present

## 2023-05-05 DIAGNOSIS — R2681 Unsteadiness on feet: Secondary | ICD-10-CM

## 2023-05-05 DIAGNOSIS — M62830 Muscle spasm of back: Secondary | ICD-10-CM | POA: Diagnosis not present

## 2023-05-05 NOTE — Therapy (Signed)
OUTPATIENT PHYSICAL THERAPY LOWER EXTREMITY Treatment    Patient Name: Jason [name] MRN: [id number] DOB:1936-02-20, 87 y.o., male Today's Date: 05/05/2023  END OF SESSION:  PT End of Session - 05/05/23 1206     Visit Number 25    Number of Visits 37    Date for PT Re-Evaluation 06/16/23    PT Start Time 1100    PT Stop Time 1128   could not tolerate full treatment   PT Time Calculation (min) 28 min    Activity Tolerance Patient tolerated treatment well    Behavior During Therapy [facility] for tasks assessed/performed                   Past Medical History:  Diagnosis Date   Anxiety and depression    Ascending aortic aneurysm (HCC) 01/22/2022   CAD in native artery 01/22/2022   Chronic renal insufficiency, stage 3 (moderate) (HCC)    Diabetes mellitus without complication (HCC)    Gait instability 01/22/2022   GERD (gastroesophageal reflux disease)    Gout    Hypercholesterolemia    Hypertension    Lumbar compression fracture (HCC)    L2, 20%, chronic (imaging 2024).  DEXA -1.0 04/2023   Orthostatic hypotension    OSA (obstructive sleep apnea)    Osteoarthritis of left hip    + troch burs   Prostate cancer (HCC)    1990-->prostatectomy, released from urol x many years   Urinary incontinence    Past Surgical History:  Procedure Laterality Date   CHOLECYSTECTOMY, LAPAROSCOPIC     1992   DEXA     T score -1.0 Sept 2024   ROBOT ASSISTED LAPAROSCOPIC RADICAL PROSTATECTOMY     1999   ROTATOR CUFF REPAIR     Left 1991, right 1997   TOTAL KNEE ARTHROPLASTY     bilat 2004   TRANSTHORACIC ECHOCARDIOGRAM     11/24/22, EF nl, +LVH with no WMA, grd I DD, valves ok (no amyloid changes)   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   Obstructive sleep apnea of adult 08/11/2022   Diabetic nephropathy with proteinuria (HCC) 08/11/2022   Obesity 08/11/2022   Hyperlipidemia 08/11/2022   Chronic kidney disease, stage 3a (HCC)  08/11/2022   Anxiety disorder, unspecified 08/11/2022   Diabetes mellitus (HCC) 08/11/2022   Primary malignant neoplasm of prostate (HCC) 08/02/2022   Carcinoma of prostate (HCC) 08/02/2022   Shortness of breath 02/22/2022   Pure hypercholesterolemia 02/22/2022   Pain in joint, shoulder region 02/22/2022   Other malaise and fatigue 02/22/2022   Nonexudative age-related macular degeneration, bilateral, intermediate dry stage 02/22/2022   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   Gastroesophageal reflux disease 02/22/2022   Benign paroxysmal positional vertigo 02/22/2022   CAD in native artery 01/22/2022   Thoracic aortic aneurysm, without rupture, unspecified (HCC) 01/22/2022   Gait instability 01/22/2022   Essential hypertension 01/17/2022   Type 2 diabetes mellitus without complications (HCC) 01/17/2022   HLD (hyperlipidemia) 01/17/2022   Obstructive sleep apnea 01/17/2022   Sensorineural hearing loss, bilateral 11/09/2021   Orthostatic hypotension 11/09/2021   Imbalance 11/09/2021   Overweight 02/21/2020   First degree heart block 02/21/2020   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   Malignant tumor of prostate (HCC) 02/21/2020   Vitamin D deficiency 05/31/2016   History of malignant neoplasm of prostate 05/31/2016   History of malignant neoplasm of skin 05/31/2016   Bilateral hearing loss 05/31/2016   Anxiety state 05/31/2016  Bronchospasm 09/24/2010   Cough 09/19/2010   Acute bronchitis 09/19/2010   Allergic rhinitis due to pollen 09/19/2010   Acute sinusitis 09/19/2010    PCP: Meredith Staggers, MD  REFERRING PROVIDER: Meredith Staggers, MD  REFERRING DIAG: R26.81 (ICD-10-CM) - Unsteadiness   THERAPY DIAG:  Unsteadiness on feet  Dizziness and giddiness  Difficulty in walking, not elsewhere classified  Rationale for Evaluation and Treatment: Rehabilitation  ONSET DATE: march 2024  SUBJECTIVE:   SUBJECTIVE STATEMENT: The patient returns today in significant low back pain. He  reports it started after the last visit the next morning. He dosn't think it was the therapy. He is having difficulty walking. He canceled the last visit and planned on canceling today.    PERTINENT HISTORY: Type 2 diabetes, sleep apnea, thoracic aortic aneurysm, orthorstatic hypotension  PAIN:  Are you having pain? No  PRECAUTIONS: None  WEIGHT BEARING RESTRICTIONS: No  FALLS:  Has patient fallen in last 6 months? Yes. Number of falls 2, step done on a small step. Another one reaching down and fell  LIVING ENVIRONMENT: Lives with: lives with their family Lives in: House/apartment Stairs: No Has following equipment at home: Single point cane  OCCUPATION: retired   PLOF: Independent  PATIENT GOALS: walking without pain, being more stable  NEXT MD VISIT: nothing scheduled   OBJECTIVE:      DIAGNOSTIC FINDINGS: nothing pertinent   PATIENT SURVEYS:  FOTO    COGNITION: Overall cognitive status: Within functional limits for tasks assessed     SENSATION: WFL  EDEMA:   MUSCLE LENGTH:   POSTURE: rounded shoulders  PALPATION:   LOWER EXTREMITY ROM:  Active ROM Right eval Left eval  Hip flexion    Hip extension    Hip abduction    Hip adduction    Hip internal rotation    Hip external rotation    Knee flexion    Knee extension    Ankle dorsiflexion    Ankle plantarflexion    Ankle inversion    Ankle eversion     (Blank rows = not tested)  LOWER EXTREMITY MMT:  MMT Right eval Left eval Right 6/11  Left  6/11 Right 7/23 Left 7/23    Hip flexion 19.5 19.3 painful 25.0 23.9 32.4 26.3    Hip extension          Hip abduction 22.5 27.1 42.1 41.2 46. 33.5    Hip adduction          Hip internal rotation          Hip external rotation          Knee flexion          Knee extension 27.4 33.2 45.1 42.5 45.4 52.3    Ankle dorsiflexion          Ankle plantarflexion          Ankle inversion          Ankle eversion           (Blank rows = not  tested)  LOWER EXTREMITY SPECIAL TESTS:    5/1: Vitals 139/76mmHg 70bpm  4/26: Vitals: 166/108 at beginning Waited and took again: 168/96 Standing: 133/852 After seated exercise: 181/112 After rest:165/108 At end of session: 167/104  EVAL: Vitals:  Patient Initial blood pressure seated was 165/118.  Patient blood pressure was retaken after a few minutes and was 171/103. Patient blood pressure was also taken standing, 134/78 FUNCTIONAL TESTS:  Eval:TUG test: 16sec- No AD 5xSTS: 27sec (  used arms to push up)  6/11  5XSTS 19  TUG 17 without AD   6 min walk test: 2.5 minutes until for seated rest break seated rest break of 2 minutes.  After rest break able to ambulate until 5:30 mark at which time he needed a seated rest break for the rest of the test.  Total distance 527 feet.  7/23 5x STS 13  6 min walk test 875 no rest break  After walk test 185/105      GAIT: Patients uses a SPC to ambulate.  Balance:   6/11 Narrow base of support standby assist Narrow base of support eyes closed min assist Tandem stance min assist bilateral Single-leg stance unable to maintain  7/23 Narrow base of support standby assist Narrow base of support eyes closed min assist Tandem stance min assist bilateral Single-leg stance unable to maintain   TODAY'S TREATMENT:                                                                                                                              DATE:  9/19  Manual: trigger point release to bilateral hips and lower back  LAD bilateral but patient was guarded so minimal LAD provided      9/10  B/P Baseline seated 130/94 Standing: 94/63   Seated:  Hip abduction 3x15  Seated march 2x15  Seated LAQ 3x10  All with a red band     8/12 Nu-step See assessment for BP measurements Ended session prematurely due to BP values and complaints of fatigue from pt.     CLINICAL IMPRESSION: Therapy performed trial of manual  therapy to is back. He had significant tenderness to palpation in his right and left lower lumbar spine and gluteals. He reported no increase or decrease in pain following manual therapy. We also tried LAD. He sees his primary care tomorrow. He was advised if he has improvement with manual therapy to talk with his MD. He was advised to get a formal script for PT for his back. If it is about the same we will put him on hold. He is unable to participate in the activity he was doing before because of his current back pain. We were unable to do the tests and measures he had done previously because of acute low back pain. We will extend his plan 2W6 with the hope that we will be able to assess these numbers when his low back pain reduces.  OBJECTIVE IMPAIRMENTS: decreased activity tolerance, decreased balance, decreased endurance, difficulty walking, decreased strength, and dizziness.   ACTIVITY LIMITATIONS: carrying, lifting, bending, standing, squatting, sleeping, and stairs  PARTICIPATION LIMITATIONS: meal prep, cleaning, laundry, driving, shopping, community activity, and yard work  PERSONAL FACTORS: 3+ comorbidities: type 2 diabetes, orthostatic hypotension, sleep apnea, thoracic aortic anuerysm  are also affecting patient's functional outcome.   REHAB POTENTIAL: Good  CLINICAL DECISION MAKING: Evolving/moderate complexity  EVALUATION COMPLEXITY: Moderate   GOALS: Goals  reviewed with patient? Yes  SHORT TERM GOALS: Target date: 5/9 Patient will increase bilateral strength by 5lbs. Baseline: Goal status: Significant improvement in strength overall initial goal which she achieved 7/23 new goal 5 pounds from measurements on 7/23  2.  Patient will be independent with HEP. Baseline:  Goal status: Performing base HEP at home 6/12  3.  Patient will show minimal fluctuation and syncope with sit to stand transfer  Baseline:  Goal status: Minimal syncope.  Continues to have fluctuations in blood  pressure  LONG TERM GOALS: Target date: 01/27/2023    Patient will transfer sit to stand without loss of balance or syncope  Baseline:  Goal status: MET 6/4  2.  Patient will perform daily activity with no loss of balance  Baseline:  Goal status: IN PROGRESS 6/12 improving but still some baseline balance issues goal continues to improve 7/23  3.  Patient will be able to walk community distances without any discomfort in left hip or feeling of lost balance .  Baseline:  Goal status: Mild pain in his hip but improving ability to ambulate in the community 7/23      PLAN:  PT FREQUENCY: 1-2x/week  PT DURATION: 10 weeks  PLANNED INTERVENTIONS: Therapeutic exercises, Therapeutic activity, Neuromuscular re-education, Balance training, Gait training, Patient/Family education, Self Care, Joint mobilization, Stair training, Aquatic Therapy, Electrical stimulation, Cryotherapy, Moist heat, Taping, Ultrasound, Ionotophoresis 4mg /ml Dexamethasone, and Manual therapy  PLAN FOR NEXT SESSION: Consider TUG, 5 STS test, 6 minute walk, screen balance; adjust goals review HEP   Lorayne Bender PT DPT  03/28/2023

## 2023-05-06 ENCOUNTER — Ambulatory Visit (INDEPENDENT_AMBULATORY_CARE_PROVIDER_SITE_OTHER): Payer: Medicare HMO | Admitting: Family Medicine

## 2023-05-06 ENCOUNTER — Encounter: Payer: Self-pay | Admitting: Family Medicine

## 2023-05-06 VITALS — BP 152/75 | HR 72 | Temp 97.2°F | Ht 71.0 in | Wt 269.0 lb

## 2023-05-06 DIAGNOSIS — L989 Disorder of the skin and subcutaneous tissue, unspecified: Secondary | ICD-10-CM

## 2023-05-06 DIAGNOSIS — I10 Essential (primary) hypertension: Secondary | ICD-10-CM

## 2023-05-06 DIAGNOSIS — Z1283 Encounter for screening for malignant neoplasm of skin: Secondary | ICD-10-CM

## 2023-05-06 DIAGNOSIS — Z125 Encounter for screening for malignant neoplasm of prostate: Secondary | ICD-10-CM

## 2023-05-06 DIAGNOSIS — G8929 Other chronic pain: Secondary | ICD-10-CM | POA: Diagnosis not present

## 2023-05-06 DIAGNOSIS — R2681 Unsteadiness on feet: Secondary | ICD-10-CM | POA: Diagnosis not present

## 2023-05-06 DIAGNOSIS — M545 Low back pain, unspecified: Secondary | ICD-10-CM

## 2023-05-06 DIAGNOSIS — Z7985 Long-term (current) use of injectable non-insulin antidiabetic drugs: Secondary | ICD-10-CM

## 2023-05-06 DIAGNOSIS — E7800 Pure hypercholesterolemia, unspecified: Secondary | ICD-10-CM | POA: Diagnosis not present

## 2023-05-06 DIAGNOSIS — E119 Type 2 diabetes mellitus without complications: Secondary | ICD-10-CM

## 2023-05-06 DIAGNOSIS — Z7689 Persons encountering health services in other specified circumstances: Secondary | ICD-10-CM

## 2023-05-06 DIAGNOSIS — Z7984 Long term (current) use of oral hypoglycemic drugs: Secondary | ICD-10-CM

## 2023-05-06 LAB — PSA, MEDICARE: PSA: 0 ng/ml — ABNORMAL LOW (ref 0.10–4.00)

## 2023-05-06 MED ORDER — TIRZEPATIDE 5 MG/0.5ML ~~LOC~~ SOAJ: 5.0000 mg | SUBCUTANEOUS | 0 refills | Status: DC

## 2023-05-06 MED ORDER — GLIMEPIRIDE 4 MG PO TABS: 4.0000 mg | ORAL_TABLET | Freq: Every day | ORAL | 3 refills | Status: DC

## 2023-05-06 NOTE — Progress Notes (Signed)
OFFICE VISIT  05/06/2023  CC:  Chief Complaint  Patient presents with   Weight Management    Pt has had no issues with Mounjaro medication    Patient is a 87 y.o. male who presents for 5-week follow-up diabetes, chronic renal insufficiency stage III, obesity/weight management, and history of lumbar vertebral fracture.  #1 diabetes without complication. Fairly well-controlled. Most recent hemoglobin A1c about 2 months ago was 7.2%. Start Mounjaro 2.5 mg SQ weekly.  Continue pioglitazone 15mg  every day. Decrease metformin to 500 mg twice a day and stop glimepiride. Next A1c after 04/28/2023. Feet exam normal today. #3 obesity, BMI 38. See #1 above--start Mounjaro.   #2 gustatory rhinitis. Start ipratropium nasal spray every 12 hours--- prescribed today.   #3 obesity, BMI 38. See #1 above--start Mounjaro.  #4 orthostatic hypotension in the setting of chronic hypertension. He is getting good results with use of midodrine. He is getting good benefit from physical therapy.  Uses a cane.   #5 lumbar compression fracture. DEXA ordered today. Discussed calcium and vitamin D supplementation today. Will start additional osteoporosis medication once DEXA result is back.  INTERIM HX: Bone density was normal on 04/25/2023.  I referred him to endocrinology to see if he was a candidate for medication due to having an old vertebral compression fracture. He has not scheduled the appointment yet.  No side effects from [facility]. He had to get back on the glimepiride due to hyperglycemia.  Since getting back on this his last 1 week of sugars has been average 133.  He is getting great benefit from PT for his gait instability.  He additionally got some low back treatment yesterday at PT and says his back feels wonderful today.  He would like a new PT referral to continue to address these issues.  ROS as above, plus--> he had a fall from standing yesterday, scraped both knees.  Otherwise  unharmed. no fevers, no CP, no SOB, no wheezing, no cough, no dizziness, no HAs, no rashes, no melena/hematochezia.  No polyuria or polydipsia.  No myalgias or arthralgias.  No focal weakness, paresthesias, or tremors.  No acute vision or hearing abnormalities.  No dysuria or unusual/new urinary urgency or frequency.  No recent changes in lower legs. No n/v/d or abd pain.  No palpitations.    Past Medical History:  Diagnosis Date   Anxiety and depression    Ascending aortic aneurysm (HCC) 01/22/2022   CAD in native artery 01/22/2022   Chronic renal insufficiency, stage 3 (moderate) (HCC)    Diabetes mellitus without complication (HCC)    Gait instability 01/22/2022   GERD (gastroesophageal reflux disease)    Gout    Hypercholesterolemia    Hypertension    Lumbar compression fracture (HCC)    L2, 20%, chronic (imaging 2024).  DEXA -1.0 04/2023   Orthostatic hypotension    OSA (obstructive sleep apnea)    Osteoarthritis of left hip    + troch burs   Prostate cancer (HCC)    1990-->prostatectomy, released from urol x many years   Urinary incontinence     Past Surgical History:  Procedure Laterality Date   CHOLECYSTECTOMY, LAPAROSCOPIC     1992   DEXA     T score -1.0 Sept 2024   ROBOT ASSISTED LAPAROSCOPIC RADICAL PROSTATECTOMY     1999   ROTATOR CUFF REPAIR     Left 1991, right 1997   TOTAL KNEE ARTHROPLASTY     bilat 2004   TRANSTHORACIC ECHOCARDIOGRAM  11/24/22, EF nl, +LVH with no WMA, grd I DD, valves ok (no amyloid changes)    Outpatient Medications Prior to Visit  Medication Sig Dispense Refill   allopurinol (ZYLOPRIM) 300 MG tablet Take 1 tablet (300 mg total) by mouth daily. 90 tablet 1   aspirin EC 81 MG tablet Take 1 tablet every day by oral route.     atorvastatin (LIPITOR) 40 MG tablet Take 1 tablet (40 mg total) by mouth daily. 90 tablet 1   b complex vitamins capsule Take 1 capsule by mouth daily.     fluticasone (FLONASE) 50 MCG/ACT nasal spray Place 1-2  sprays into both nostrils daily. 16 g 6   glucose blood test strip E11.9 Use to test blood sugar twice daily or as needed 100 each 12   ipratropium (ATROVENT) 0.03 % nasal spray PLACE 2 SPRAYS INTO BOTH NOSTRILS EVERY 12 (TWELVE) HOURS. USE 15 TO 30 MIN PRIOR TO BREAKFAST AND SUPPER 30 mL 6   Lancet Device MISC Dispense new lancet device based on patient insurance and current one touch delica products 1 each 0   losartan (COZAAR) 25 MG tablet Take 1 tablet (25 mg total) by mouth at bedtime. 90 tablet 2   metFORMIN (GLUCOPHAGE) 500 MG tablet 1 tab p.o. twice daily     metoprolol tartrate (LOPRESSOR) 25 MG tablet Take 12.5 mg by mouth 2 (two) times daily. 1/2 tablet 2 times daily 180 tablet 3   midodrine (PROAMATINE) 10 MG tablet TAKE 1 TABLET BEFORE GETTING OUT OF BED OR SOON AFTER, 1 TABLET AT 12PM, 1 TABLET AROUND 4PM (Patient taking differently: TAKE 1 TABLET BEFORE GETTING OUT OF BED OR SOON AFTER, 1 TABLET AT 12PM.) 270 tablet 0   Multiple Vitamins-Minerals (ICAPS AREDS 2 PO) TAKE 1 CAPSULE BY MOUTH TWICE A DAY FOR MACULAR DEGENERATION     omeprazole (PRILOSEC) 20 MG capsule Take 1 capsule (20 mg total) by mouth daily. 90 capsule 1   OneTouch Delica Lancets 33G MISC E11.9 Use to test blood sugar twice daily or as needed 100 each 5   pioglitazone (ACTOS) 15 MG tablet Take 1 tablet (15 mg total) by mouth daily. 90 tablet 1   venlafaxine (EFFEXOR) 75 MG tablet Take 1 tablet (75 mg total) by mouth daily. 90 tablet 1   tirzepatide (MOUNJARO) 2.5 MG/0.5ML Pen Inject 2.5 mg into the skin once a week. 2 mL 0   No facility-administered medications prior to visit.    Allergies  Allergen Reactions   Lisinopril Cough, Swelling and Other (See Comments)    Review of Systems As per HPI  PE:    05/06/2023   10:31 AM 04/08/2023    1:32 PM 04/08/2023    1:25 PM  Vitals with BMI  Height 5\' 11"   5\' 11"   Weight 269 lbs  274 lbs 6 oz  BMI 37.53  38.29  Systolic 152 140 213  Diastolic 75 79 71  Pulse  72  69     Physical Exam  Gen: Alert, well appearing.  Patient is oriented to person, place, time, and situation. AFFECT: pleasant, lucid thought and speech. Right knee with tennis ball sized abrasion with a bit of moisture.  No exudate or bleeding or surrounding erythema. Small dry abrasion on left knee.  LABS:  Last CBC Lab Results  Component Value Date   WBC 8.9 04/12/2022   HGB 13.0 04/12/2022   HCT 39.3 04/12/2022   MCV 90 04/12/2022   MCH 29.7 04/12/2022  RDW 13.1 04/12/2022   PLT 275 04/12/2022   Last metabolic panel Lab Results  Component Value Date   GLUCOSE 194 (H) 01/26/2023   NA 136 01/26/2023   K 4.5 01/26/2023   CL 102 01/26/2023   CO2 26 01/26/2023   BUN 19 01/26/2023   CREATININE 1.28 01/26/2023   GFR 51.19 (L) 01/26/2023   CALCIUM 9.3 01/26/2023   PROT 7.5 01/26/2023   ALBUMIN 4.1 01/26/2023   BILITOT 0.3 01/26/2023   ALKPHOS 73 01/26/2023   AST 25 01/26/2023   ALT 18 01/26/2023   Last lipids Lab Results  Component Value Date   CHOL 156 10/25/2022   HDL 47.90 10/25/2022   LDLCALC 82 10/25/2022   TRIG 131.0 10/25/2022   CHOLHDL 3 10/25/2022   Last hemoglobin A1c Lab Results  Component Value Date   HGBA1C 7.2 (H) 01/26/2023   Last thyroid functions Lab Results  Component Value Date   TSH 3.410 04/12/2022   T4TOTAL 7.1 04/12/2022   Last vitamin B12 and Folate Lab Results  Component Value Date   VITAMINB12 422 04/12/2022   FOLATE 18.5 04/12/2022    IMPRESSION AND PLAN:  #1 weight management. Has lost 6 pounds on Mounjaro and is tolerating the 2.5 mg weekly dose. Increase to 5 mg weekly dose.  Recheck 1 month.  2.  Diabetes without complication. Glucoses good in the last week. Continue metformin 500 mg twice daily and glimepiride 4 mg a day. Increase Mounjaro as per #1 above. Hemoglobin A1c today.  Next urine microalbumin/creatinine due March 2025.  #3 gait instability.  Chronic bilateral low back pain. New PT referral  placed today.  4.  Question of osteoporosis. L2 compression, 20%, chronic (imaging 2024).  DEXA -1.0 04/2023. Given this discordant data, he is in the processing of making appointment with endocrinology for further evaluation and management.  #5 gustatory rhinitis. He has had some improvement with ipratropium nasal spray.  6.  Chronic renal insufficiency stage III. Avoid NSAIDs.  Hydrate well. Monitor electrolytes and creatinine today.  An After Visit Summary was printed and given to the patient.  FOLLOW UP: Return in about 4 weeks (around 06/03/2023) for routine chronic illness f/u.  Signed:  Santiago Bumpers, MD           05/06/2023

## 2023-05-07 LAB — CBC WITH DIFFERENTIAL/PLATELET
Absolute Monocytes: 774 cells/uL (ref 200–950)
Basophils Absolute: 32 cells/uL (ref 0–200)
Basophils Relative: 0.4 %
Eosinophils Absolute: 308 cells/uL (ref 15–500)
Eosinophils Relative: 3.9 %
HCT: 42 % (ref 38.5–50.0)
Hemoglobin: 13.5 g/dL (ref 13.2–17.1)
Lymphs Abs: 2670 cells/uL (ref 850–3900)
MCH: 29 pg (ref 27.0–33.0)
MCHC: 32.1 g/dL (ref 32.0–36.0)
MCV: 90.3 fL (ref 80.0–100.0)
MPV: 11.1 fL (ref 7.5–12.5)
Monocytes Relative: 9.8 %
Neutro Abs: 4116 cells/uL (ref 1500–7800)
Neutrophils Relative %: 52.1 %
Platelets: 276 10*3/uL (ref 140–400)
RBC: 4.65 10*6/uL (ref 4.20–5.80)
RDW: 13.8 % (ref 11.0–15.0)
Total Lymphocyte: 33.8 %
WBC: 7.9 10*3/uL (ref 3.8–10.8)

## 2023-05-07 LAB — HEMOGLOBIN A1C
Hgb A1c MFr Bld: 7 % of total Hgb — ABNORMAL HIGH (ref <5.7)
Mean Plasma Glucose: 154 mg/dL
eAG (mmol/L): 8.5 mmol/L

## 2023-05-07 LAB — LIPID PANEL
Cholesterol: 112 mg/dL (ref <200)
HDL: 41 mg/dL (ref >=40)
LDL Cholesterol (Calc): 48 mg/dL (calc)
Non-HDL Cholesterol (Calc): 71 mg/dL (calc) (ref <130)
Total CHOL/HDL Ratio: 2.7 (calc) (ref <5.0)
Triglycerides: 146 mg/dL (ref <150)

## 2023-05-07 LAB — COMPREHENSIVE METABOLIC PANEL
AG Ratio: 1.5 (calc) (ref 1.0–2.5)
ALT: 25 U/L (ref 9–46)
AST: 24 U/L (ref 10–35)
Albumin: 4.1 g/dL (ref 3.6–5.1)
Alkaline phosphatase (APISO): 100 U/L (ref 35–144)
BUN/Creatinine Ratio: 15 (calc) (ref 6–22)
BUN: 22 mg/dL (ref 7–25)
CO2: 26 mmol/L (ref 20–32)
Calcium: 9.6 mg/dL (ref 8.6–10.3)
Chloride: 101 mmol/L (ref 98–110)
Creat: 1.43 mg/dL — ABNORMAL HIGH (ref 0.70–1.22)
Globulin: 2.8 g/dL (calc) (ref 1.9–3.7)
Glucose, Bld: 207 mg/dL — ABNORMAL HIGH (ref 65–99)
Potassium: 5.3 mmol/L (ref 3.5–5.3)
Sodium: 138 mmol/L (ref 135–146)
Total Bilirubin: 0.5 mg/dL (ref 0.2–1.2)
Total Protein: 6.9 g/dL (ref 6.1–8.1)

## 2023-05-09 ENCOUNTER — Telehealth: Payer: Self-pay

## 2023-05-09 MED ORDER — GLIMEPIRIDE 1 MG PO TABS: 1.0000 mg | ORAL_TABLET | Freq: Every day | ORAL | 1 refills | Status: DC

## 2023-05-09 NOTE — Telephone Encounter (Signed)
My mistake.  When looking back through the records I thought the most recent dose from his prior PCP was 4mg  tab.   I will send in rx for glimeperide 1mg  qAM now.

## 2023-05-09 NOTE — Telephone Encounter (Signed)
Patient was seen on Friday by Dr. Milinda Cave. Patient went to pharmacy to pick up meds. Prescription for glimepiride (AMARYL) 4 MG tablet  Was there, but did not pick up.  He was not aware that med was going to be increased from 1mg  to 4mg . Is this correct?  I found in OV notes "Decrease metformin to 500 mg twice a day and stop glimepiride."  Please advise.

## 2023-05-10 ENCOUNTER — Encounter (HOSPITAL_BASED_OUTPATIENT_CLINIC_OR_DEPARTMENT_OTHER): Payer: Self-pay | Admitting: Physical Therapy

## 2023-05-10 ENCOUNTER — Ambulatory Visit (HOSPITAL_BASED_OUTPATIENT_CLINIC_OR_DEPARTMENT_OTHER): Payer: Medicare HMO | Admitting: Physical Therapy

## 2023-05-10 DIAGNOSIS — R42 Dizziness and giddiness: Secondary | ICD-10-CM | POA: Diagnosis not present

## 2023-05-10 DIAGNOSIS — M5459 Other low back pain: Secondary | ICD-10-CM | POA: Diagnosis not present

## 2023-05-10 DIAGNOSIS — M545 Low back pain, unspecified: Secondary | ICD-10-CM | POA: Diagnosis not present

## 2023-05-10 DIAGNOSIS — R2681 Unsteadiness on feet: Secondary | ICD-10-CM | POA: Diagnosis not present

## 2023-05-10 DIAGNOSIS — R262 Difficulty in walking, not elsewhere classified: Secondary | ICD-10-CM | POA: Diagnosis not present

## 2023-05-10 DIAGNOSIS — M62830 Muscle spasm of back: Secondary | ICD-10-CM | POA: Diagnosis not present

## 2023-05-10 DIAGNOSIS — G8929 Other chronic pain: Secondary | ICD-10-CM | POA: Diagnosis not present

## 2023-05-10 NOTE — Therapy (Signed)
OUTPATIENT PHYSICAL THERAPY LOWER EXTREMITY Treatment/ Back Eval   Patient Name: Burns Skluzacek MRN: [id number] DOB:May 26, 1936, 87 y.o., male Today's Date: 05/10/2023  END OF SESSION:  PT End of Session - 05/10/23 1208     Visit Number 26    Number of Visits 42    Date for PT Re-Evaluation 07/05/23    PT Start Time 0930    PT Stop Time 1012    PT Time Calculation (min) 42 min    Activity Tolerance Patient tolerated treatment well    Behavior During Therapy [facility] for tasks assessed/performed                    Past Medical History:  Diagnosis Date   Anxiety and depression    Ascending aortic aneurysm (HCC) 01/22/2022   CAD in native artery 01/22/2022   Chronic renal insufficiency, stage 3 (moderate) (HCC)    Diabetes mellitus without complication (HCC)    Gait instability 01/22/2022   GERD (gastroesophageal reflux disease)    Gout    Hypercholesterolemia    Hypertension    Lumbar compression fracture (HCC)    L2, 20%, chronic (imaging 2024).  DEXA -1.0 04/2023   Orthostatic hypotension    OSA (obstructive sleep apnea)    Osteoarthritis of left hip    + troch burs   Prostate cancer (HCC)    1990-->prostatectomy, released from urol x many years   Urinary incontinence    Past Surgical History:  Procedure Laterality Date   CHOLECYSTECTOMY, LAPAROSCOPIC     1992   DEXA     T score -1.0 Sept 2024   ROBOT ASSISTED LAPAROSCOPIC RADICAL PROSTATECTOMY     1999   ROTATOR CUFF REPAIR     Left 1991, right 1997   TOTAL KNEE ARTHROPLASTY     bilat 2004   TRANSTHORACIC ECHOCARDIOGRAM     11/24/22, EF nl, +LVH with no WMA, grd I DD, valves ok (no amyloid changes)   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   Obstructive sleep apnea of adult 08/11/2022   Diabetic nephropathy with proteinuria (HCC) 08/11/2022   Obesity 08/11/2022   Hyperlipidemia 08/11/2022   Chronic kidney disease, stage 3a (HCC) 08/11/2022   Anxiety disorder,  unspecified 08/11/2022   Diabetes mellitus (HCC) 08/11/2022   Primary malignant neoplasm of prostate (HCC) 08/02/2022   Carcinoma of prostate (HCC) 08/02/2022   Shortness of breath 02/22/2022   Pure hypercholesterolemia 02/22/2022   Pain in joint, shoulder region 02/22/2022   Other malaise and fatigue 02/22/2022   Nonexudative age-related macular degeneration, bilateral, intermediate dry stage 02/22/2022   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   Gastroesophageal reflux disease 02/22/2022   Benign paroxysmal positional vertigo 02/22/2022   CAD in native artery 01/22/2022   Thoracic aortic aneurysm, without rupture, unspecified (HCC) 01/22/2022   Gait instability 01/22/2022   Essential hypertension 01/17/2022   Type 2 diabetes mellitus without complications (HCC) 01/17/2022   HLD (hyperlipidemia) 01/17/2022   Obstructive sleep apnea 01/17/2022   Sensorineural hearing loss, bilateral 11/09/2021   Orthostatic hypotension 11/09/2021   Imbalance 11/09/2021   Overweight 02/21/2020   First degree heart block 02/21/2020   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   Malignant tumor of prostate (HCC) 02/21/2020   Vitamin D deficiency 05/31/2016   History of malignant neoplasm of prostate 05/31/2016   History of malignant neoplasm of skin 05/31/2016   Bilateral hearing loss 05/31/2016   Anxiety state 05/31/2016   Bronchospasm 09/24/2010  Cough 09/19/2010   Acute bronchitis 09/19/2010   Allergic rhinitis due to pollen 09/19/2010   Acute sinusitis 09/19/2010    PCP: Meredith Staggers, MD  REFERRING PROVIDER: Meredith Staggers, MD  REFERRING DIAG: R26.81 (ICD-10-CM) - Unsteadiness   THERAPY DIAG:  Unsteadiness on feet  Dizziness and giddiness  Difficulty in walking, not elsewhere classified  Other low back pain  Muscle spasm of back  Rationale for Evaluation and Treatment: Rehabilitation  ONSET DATE: march 2024  SUBJECTIVE:   SUBJECTIVE STATEMENT: The patient's low back pain has improved since  last treatment. He had a fall following therapy on Thursday. He was walking around the car and slipped. He hit his knee. He saw his MD the next day. His right knee is sore. He has a new script fro his low back.  PERTINENT HISTORY: Type 2 diabetes, sleep apnea, thoracic aortic aneurysm, orthorstatic hypotension  PAIN:  Are you having pain? No  PRECAUTIONS: None  WEIGHT BEARING RESTRICTIONS: No  FALLS:  Has patient fallen in last 6 months? Yes. Number of falls 2, step done on a small step. Another one reaching down and fell  LIVING ENVIRONMENT: Lives with: lives with their family Lives in: House/apartment Stairs: No Has following equipment at home: Single point cane  OCCUPATION: retired   PLOF: Independent  PATIENT GOALS: walking without pain, being more stable  NEXT MD VISIT: nothing scheduled   OBJECTIVE:      DIAGNOSTIC FINDINGS: nothing pertinent   PATIENT SURVEYS:  FOTO    COGNITION: Overall cognitive status: Within functional limits for tasks assessed     SENSATION: WFL  EDEMA:   MUSCLE LENGTH:   POSTURE: rounded shoulders  PALPATION:   LOWER EXTREMITY ROM:  Active ROM Right eval Left eval  Hip flexion    Hip extension    Hip abduction    Hip adduction    Hip internal rotation    Hip external rotation    Knee flexion    Knee extension    Ankle dorsiflexion    Ankle plantarflexion    Ankle inversion    Ankle eversion     (Blank rows = not tested)  LOWER EXTREMITY MMT:  MMT Right eval Left eval Right 6/11  Left  6/11 Right 7/23 Left 7/23 Right  9/24 Left  9/24  Hip flexion 19.5 19.3 painful 25.0 23.9 32.4 26.3 14.8 26.3  Hip extension          Hip abduction 22.5 27.1 42.1 41.2 46. 33.5 32.9 28.8  Hip adduction          Hip internal rotation          Hip external rotation          Knee flexion          Knee extension 27.4 33.2 45.1 42.5 45.4 52.3 30.8 32.2   Ankle dorsiflexion          Ankle plantarflexion          Ankle  inversion          Ankle eversion           (Blank rows = not tested)  LOWER EXTREMITY SPECIAL TESTS:    5/1: Vitals 139/21mmHg 70bpm  4/26: Vitals: 166/108 at beginning Waited and took again: 168/96 Standing: 133/852 After seated exercise: 181/112 After rest:165/108 At end of session: 167/104  EVAL: Vitals:  Patient Initial blood pressure seated was 165/118.  Patient blood pressure was retaken after a few minutes and was 171/103.  Patient blood pressure was also taken standing, 134/78 FUNCTIONAL TESTS:  Eval:TUG test: 16sec- No AD 5xSTS: 27sec (used arms to push up)  6/11  5XSTS 19  TUG 17 without AD   6 min walk test: 2.5 minutes until for seated rest break seated rest break of 2 minutes.  After rest break able to ambulate until 5:30 mark at which time he needed a seated rest break for the rest of the test.  Total distance 527 feet.  7/23 5x STS 13  6 min walk test 875 no rest break  After walk test 185/105      GAIT: Patients uses a SPC to ambulate.  Balance:   6/11 Narrow base of support standby assist Narrow base of support eyes closed min assist Tandem stance min assist bilateral Single-leg stance unable to maintain  7/23 Narrow base of support standby assist Narrow base of support eyes closed min assist Tandem stance min assist bilateral Single-leg stance unable to maintain  9/24 7/23 Narrow base of support standby assist Narrow base of support eyes closed min assist Tandem stance min assist bilateral Single-leg stance unable to maintain  No change from previous visit   TODAY'S TREATMENT:                                                                                                                              DATE:  9/24 Assessed patient low back  Manual: trigger point release to bilateral hips and lower back  LAD bilateral but patient was guarded so minimal LAD provided   LAQ 2x10 red  Hip abduction 2x10 red  Seated march red 2x10    Mild dyspnea noted with there-ex     9/19  Manual: trigger point release to bilateral hips and lower back  LAD bilateral but patient was guarded so minimal LAD provided      9/10  B/P Baseline seated 130/94 Standing: 94/63   Seated:  Hip abduction 3x15  Seated march 2x15  Seated LAQ 3x10  All with a red band     8/12 Nu-step See assessment for BP measurements Ended session prematurely due to BP values and complaints of fatigue from pt.     CLINICAL IMPRESSION: Therapy performed assessment of patient back. He has increased pain with lumbar flexion and right rotation. He continues to have a significant spasm in his left gluteal. His right knee is sore at this time. Since the onset of his back pain his muscle strength has decreased. He has more weakness on the right side today. We were able to re-start LE exercises today with minimal pain. We continue to work on manual therapy to the low back. We will continue to work on balance, endurance, and low back pain  2W8. We did not assess 5x sit to stand or 6 min walk today 2nd to lumbar spine. We will hopefully re-assess over the next few visits. We were abe to integrate there-ex back into  his program today. He tolerated well.     OBJECTIVE IMPAIRMENTS: decreased activity tolerance, decreased balance, decreased endurance, difficulty walking, decreased strength, and dizziness.   ACTIVITY LIMITATIONS: carrying, lifting, bending, standing, squatting, sleeping, and stairs  PARTICIPATION LIMITATIONS: meal prep, cleaning, laundry, driving, shopping, community activity, and yard work  PERSONAL FACTORS: 3+ comorbidities: type 2 diabetes, orthostatic hypotension, sleep apnea, thoracic aortic anuerysm  are also affecting patient's functional outcome.   REHAB POTENTIAL: Good  CLINICAL DECISION MAKING: Evolving/moderate complexity  EVALUATION COMPLEXITY: Moderate   GOALS: Goals reviewed with patient? Yes  SHORT TERM GOALS:  Target date: 5/9 Patient will increase bilateral strength by 5lbs. Baseline: Goal status: Significant improvement in strength overall initial goal which she achieved 7/23 new goal 5 pounds from measurements on 7/23  2.  Patient will be independent with HEP. Baseline:  Goal status: Performing base HEP at home 6/12  3.  Patient will show minimal fluctuation and syncope with sit to stand transfer  Baseline:  Goal status: Minimal syncope.  Continues to have fluctuations in blood pressure  4. Patient will demonstrate full lumbar flexion and rotation without pain  Goal status : new   5. Patient will increase gross bilateral LE strength to 5/5   LONG TERM GOALS: Target date: 01/27/2023    Patient will transfer sit to stand without loss of balance or syncope  Baseline:  Goal status: MET 6/4  2.  Patient will perform daily activity with no loss of balance  Baseline:  Goal status: IN PROGRESS 6/12 improving but still some baseline balance issues goal continues to improve 7/23  3.  Patient will be able to walk community distances without any discomfort in left hip or feeling of lost balance .  Baseline:  Goal status: Mild pain in his hip but improving ability to ambulate in the community 7/23      PLAN:  PT FREQUENCY: 1-2x/week  PT DURATION: 10 weeks  PLANNED INTERVENTIONS: Therapeutic exercises, Therapeutic activity, Neuromuscular re-education, Balance training, Gait training, Patient/Family education, Self Care, Joint mobilization, Stair training, Aquatic Therapy, Electrical stimulation, Cryotherapy, Moist heat, Taping, Ultrasound, Ionotophoresis 4mg /ml Dexamethasone, and Manual therapy  PLAN FOR NEXT SESSION: Consider TUG, 5 STS test, 6 minute walk, screen balance; adjust goals review HEP   Lorayne Bender PT DPT  03/28/2023

## 2023-05-12 ENCOUNTER — Encounter (HOSPITAL_BASED_OUTPATIENT_CLINIC_OR_DEPARTMENT_OTHER): Payer: Self-pay

## 2023-05-12 ENCOUNTER — Ambulatory Visit (HOSPITAL_BASED_OUTPATIENT_CLINIC_OR_DEPARTMENT_OTHER): Payer: Medicare HMO

## 2023-05-12 DIAGNOSIS — R42 Dizziness and giddiness: Secondary | ICD-10-CM | POA: Diagnosis not present

## 2023-05-12 DIAGNOSIS — R2681 Unsteadiness on feet: Secondary | ICD-10-CM | POA: Diagnosis not present

## 2023-05-12 DIAGNOSIS — M5459 Other low back pain: Secondary | ICD-10-CM

## 2023-05-12 DIAGNOSIS — R262 Difficulty in walking, not elsewhere classified: Secondary | ICD-10-CM | POA: Diagnosis not present

## 2023-05-12 DIAGNOSIS — G8929 Other chronic pain: Secondary | ICD-10-CM | POA: Diagnosis not present

## 2023-05-12 DIAGNOSIS — M62830 Muscle spasm of back: Secondary | ICD-10-CM | POA: Diagnosis not present

## 2023-05-12 DIAGNOSIS — M545 Low back pain, unspecified: Secondary | ICD-10-CM | POA: Diagnosis not present

## 2023-05-12 NOTE — Therapy (Signed)
OUTPATIENT PHYSICAL THERAPY LOWER EXTREMITY TREATMENT   Patient Name: Jason [name] MRN: [id number] DOB:04/10/1936, 87 y.o., male Today's Date: 05/12/2023  END OF SESSION:  PT End of Session - 05/12/23 0937     Visit Number 27    Number of Visits 42    Date for PT Re-Evaluation 07/05/23    PT Start Time 0935    PT Stop Time 1015    PT Time Calculation (min) 40 min    Equipment Utilized During Treatment Gait belt    Activity Tolerance Patient tolerated treatment well    Behavior During Therapy WFL for tasks assessed/performed                     Past Medical History:  Diagnosis Date   Anxiety and depression    Ascending aortic aneurysm (HCC) 01/22/2022   CAD in native artery 01/22/2022   Chronic renal insufficiency, stage 3 (moderate) (HCC)    Diabetes mellitus without complication (HCC)    Gait instability 01/22/2022   GERD (gastroesophageal reflux disease)    Gout    Hypercholesterolemia    Hypertension    Lumbar compression fracture (HCC)    L2, 20%, chronic (imaging 2024).  DEXA -1.0 04/2023   Orthostatic hypotension    OSA (obstructive sleep apnea)    Osteoarthritis of left hip    + troch burs   Prostate cancer (HCC)    1990-->prostatectomy, released from urol x many years   Urinary incontinence    Past Surgical History:  Procedure Laterality Date   CHOLECYSTECTOMY, LAPAROSCOPIC     1992   DEXA     T score -1.0 Sept 2024   ROBOT ASSISTED LAPAROSCOPIC RADICAL PROSTATECTOMY     1999   ROTATOR CUFF REPAIR     Left 1991, right 1997   TOTAL KNEE ARTHROPLASTY     bilat 2004   TRANSTHORACIC ECHOCARDIOGRAM     11/24/22, EF nl, +LVH with no WMA, grd I DD, valves ok (no amyloid changes)   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   Obstructive sleep apnea of adult 08/11/2022   Diabetic nephropathy with proteinuria (HCC) 08/11/2022   Obesity 08/11/2022   Hyperlipidemia 08/11/2022   Chronic kidney disease, stage  3a (HCC) 08/11/2022   Anxiety disorder, unspecified 08/11/2022   Diabetes mellitus (HCC) 08/11/2022   Primary malignant neoplasm of prostate (HCC) 08/02/2022   Carcinoma of prostate (HCC) 08/02/2022   Shortness of breath 02/22/2022   Pure hypercholesterolemia 02/22/2022   Pain in joint, shoulder region 02/22/2022   Other malaise and fatigue 02/22/2022   Nonexudative age-related macular degeneration, bilateral, intermediate dry stage 02/22/2022   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   Gastroesophageal reflux disease 02/22/2022   Benign paroxysmal positional vertigo 02/22/2022   CAD in native artery 01/22/2022   Thoracic aortic aneurysm, without rupture, unspecified (HCC) 01/22/2022   Gait instability 01/22/2022   Essential hypertension 01/17/2022   Type 2 diabetes mellitus without complications (HCC) 01/17/2022   HLD (hyperlipidemia) 01/17/2022   Obstructive sleep apnea 01/17/2022   Sensorineural hearing loss, bilateral 11/09/2021   Orthostatic hypotension 11/09/2021   Imbalance 11/09/2021   Overweight 02/21/2020   First degree heart block 02/21/2020   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   Malignant tumor of prostate (HCC) 02/21/2020   Vitamin D deficiency 05/31/2016   History of malignant neoplasm of prostate 05/31/2016   History of malignant neoplasm of skin 05/31/2016   Bilateral hearing loss 05/31/2016   Anxiety  state 05/31/2016   Bronchospasm 09/24/2010   Cough 09/19/2010   Acute bronchitis 09/19/2010   Allergic rhinitis due to pollen 09/19/2010   Acute sinusitis 09/19/2010    PCP: Meredith Staggers, MD  REFERRING PROVIDER: Meredith Staggers, MD  REFERRING DIAG: R26.81 (ICD-10-CM) - Unsteadiness   THERAPY DIAG:  Unsteadiness on feet  Difficulty in walking, not elsewhere classified  Dizziness and giddiness  Other low back pain  Muscle spasm of back  Rationale for Evaluation and Treatment: Rehabilitation  ONSET DATE: march 2024  SUBJECTIVE:   SUBJECTIVE STATEMENT: Pt  reports no new falls since last Thursday. Feels shaky this morning. Pt had  self corrected LOB when ambulating from chair in waiting area back to treatment area.   PERTINENT HISTORY: Type 2 diabetes, sleep apnea, thoracic aortic aneurysm, orthorstatic hypotension  PAIN:  Are you having pain? No  PRECAUTIONS: None  WEIGHT BEARING RESTRICTIONS: No  FALLS:  Has patient fallen in last 6 months? Yes. Number of falls 2, step done on a small step. Another one reaching down and fell  LIVING ENVIRONMENT: Lives with: lives with their family Lives in: House/apartment Stairs: No Has following equipment at home: Single point cane  OCCUPATION: retired   PLOF: Independent  PATIENT GOALS: walking without pain, being more stable  NEXT MD VISIT: nothing scheduled   OBJECTIVE:      DIAGNOSTIC FINDINGS: nothing pertinent   PATIENT SURVEYS:  FOTO    COGNITION: Overall cognitive status: Within functional limits for tasks assessed     SENSATION: WFL  EDEMA:   MUSCLE LENGTH:   POSTURE: rounded shoulders  PALPATION:   LOWER EXTREMITY ROM:  Active ROM Right eval Left eval  Hip flexion    Hip extension    Hip abduction    Hip adduction    Hip internal rotation    Hip external rotation    Knee flexion    Knee extension    Ankle dorsiflexion    Ankle plantarflexion    Ankle inversion    Ankle eversion     (Blank rows = not tested)  LOWER EXTREMITY MMT:  MMT Right eval Left eval Right 6/11  Left  6/11 Right 7/23 Left 7/23 Right  9/24 Left  9/24  Hip flexion 19.5 19.3 painful 25.0 23.9 32.4 26.3 14.8 26.3  Hip extension          Hip abduction 22.5 27.1 42.1 41.2 46. 33.5 32.9 28.8  Hip adduction          Hip internal rotation          Hip external rotation          Knee flexion          Knee extension 27.4 33.2 45.1 42.5 45.4 52.3 30.8 32.2   Ankle dorsiflexion          Ankle plantarflexion          Ankle inversion          Ankle eversion            (Blank rows = not tested)  LOWER EXTREMITY SPECIAL TESTS:    5/1: Vitals 139/18mmHg 70bpm  4/26: Vitals: 166/108 at beginning Waited and took again: 168/96 Standing: 133/852 After seated exercise: 181/112 After rest:165/108 At end of session: 167/104  EVAL: Vitals:  Patient Initial blood pressure seated was 165/118.  Patient blood pressure was retaken after a few minutes and was 171/103. Patient blood pressure was also taken standing, 134/78 FUNCTIONAL TESTS:  Eval:TUG test:  16sec- No AD 5xSTS: 27sec (used arms to push up)  6/11  5XSTS 19  TUG 17 without AD   6 min walk test: 2.5 minutes until for seated rest break seated rest break of 2 minutes.  After rest break able to ambulate until 5:30 mark at which time he needed a seated rest break for the rest of the test.  Total distance 527 feet.  7/23 5x STS 13  6 min walk test 875 no rest break  After walk test 185/105   9/26  6 min walk test-746ft with cane 5xSTS- 16.10     GAIT: Patients uses a SPC to ambulate.  Balance:   6/11 Narrow base of support standby assist Narrow base of support eyes closed min assist Tandem stance min assist bilateral Single-leg stance unable to maintain  7/23 Narrow base of support standby assist Narrow base of support eyes closed min assist Tandem stance min assist bilateral Single-leg stance unable to maintain  9/24 7/23 Narrow base of support standby assist Narrow base of support eyes closed min assist Tandem stance min assist bilateral Single-leg stance unable to maintain  No change from previous visit   TODAY'S TREATMENT:                                                                                                                              DATE:   9/26:  Beginning of session 140/90 seated BP 115/75 standing BP Seated lumbar flexion stretch with ball on plinth 10sec x5 6MWT-728ft with cane BP seated 158/99 5xSTS 16.1 sec Supine LTR x10 5sec  hold Supine SKTC 20sec x2ea Supine clam GTB x20   9/24 Assessed patient low back  Manual: trigger point release to bilateral hips and lower back  LAD bilateral but patient was guarded so minimal LAD provided   LAQ 2x10 red  Hip abduction 2x10 red  Seated march red 2x10   Mild dyspnea noted with there-ex     9/19  Manual: trigger point release to bilateral hips and lower back  LAD bilateral but patient was guarded so minimal LAD provided      9/10  B/P Baseline seated 130/94 Standing: 94/63   Seated:  Hip abduction 3x15  Seated march 2x15  Seated LAQ 3x10  All with a red band     CLINICAL IMPRESSION:  Good tolerance for seated lumbar stretching, though had to modify UE position to avoid L shoulder discomfort. Pt able to complete functional outcome measures today. He demonstrates regression in these since last measured. Required use of cane and SBA with for safety. BP did elevate following this, but not to abnormal ranges for pt. Good tolerance for gentle low back stretching in supine. Pt has started [facility] medicine with his new MD and is being monitored with this.   RE-EVAL: herapy performed assessment of patient back. He has increased pain with lumbar flexion and right rotation. He continues to have a significant spasm in  his left gluteal. His right knee is sore at this time. Since the onset of his back pain his muscle strength has decreased. He has more weakness on the right side today. We were able to re-start LE exercises today with minimal pain. We continue to work on manual therapy to the low back. We will continue to work on balance, endurance, and low back pain  2W8. We did not assess 5x sit to stand or 6 min walk today 2nd to lumbar spine. We will hopefully re-assess over the next few visits. We were abe to integrate there-ex back into his program today. He tolerated well.     OBJECTIVE IMPAIRMENTS: decreased activity tolerance, decreased balance,  decreased endurance, difficulty walking, decreased strength, and dizziness.   ACTIVITY LIMITATIONS: carrying, lifting, bending, standing, squatting, sleeping, and stairs  PARTICIPATION LIMITATIONS: meal prep, cleaning, laundry, driving, shopping, community activity, and yard work  PERSONAL FACTORS: 3+ comorbidities: type 2 diabetes, orthostatic hypotension, sleep apnea, thoracic aortic anuerysm  are also affecting patient's functional outcome.   REHAB POTENTIAL: Good  CLINICAL DECISION MAKING: Evolving/moderate complexity  EVALUATION COMPLEXITY: Moderate   GOALS: Goals reviewed with patient? Yes  SHORT TERM GOALS: Target date: 5/9 Patient will increase bilateral strength by 5lbs. Baseline: Goal status: Significant improvement in strength overall initial goal which she achieved 7/23 new goal 5 pounds from measurements on 7/23  2.  Patient will be independent with HEP. Baseline:  Goal status: Performing base HEP at home 6/12  3.  Patient will show minimal fluctuation and syncope with sit to stand transfer  Baseline:  Goal status: Minimal syncope.  Continues to have fluctuations in blood pressure  4. Patient will demonstrate full lumbar flexion and rotation without pain  Goal status : new   5. Patient will increase gross bilateral LE strength to 5/5   LONG TERM GOALS: Target date: 01/27/2023    Patient will transfer sit to stand without loss of balance or syncope  Baseline:  Goal status: MET 6/4  2.  Patient will perform daily activity with no loss of balance  Baseline:  Goal status: IN PROGRESS 6/12 improving but still some baseline balance issues goal continues to improve 7/23  3.  Patient will be able to walk community distances without any discomfort in left hip or feeling of lost balance .  Baseline:  Goal status: Mild pain in his hip but improving ability to ambulate in the community 7/23      PLAN:  PT FREQUENCY: 1-2x/week  PT DURATION: 10  weeks  PLANNED INTERVENTIONS: Therapeutic exercises, Therapeutic activity, Neuromuscular re-education, Balance training, Gait training, Patient/Family education, Self Care, Joint mobilization, Stair training, Aquatic Therapy, Electrical stimulation, Cryotherapy, Moist heat, Taping, Ultrasound, Ionotophoresis 4mg /ml Dexamethasone, and Manual therapy  PLAN FOR NEXT SESSION: Consider TUG, 5 STS test, 6 minute walk, screen balance; adjust goals review HEP   Riki Altes, PTA   03/28/2023

## 2023-05-30 ENCOUNTER — Other Ambulatory Visit (HOSPITAL_BASED_OUTPATIENT_CLINIC_OR_DEPARTMENT_OTHER): Payer: Self-pay | Admitting: Cardiovascular Disease

## 2023-05-30 ENCOUNTER — Ambulatory Visit (HOSPITAL_BASED_OUTPATIENT_CLINIC_OR_DEPARTMENT_OTHER): Payer: Medicare HMO | Attending: Family Medicine | Admitting: Physical Therapy

## 2023-05-30 DIAGNOSIS — R2681 Unsteadiness on feet: Secondary | ICD-10-CM | POA: Diagnosis not present

## 2023-05-30 DIAGNOSIS — M62830 Muscle spasm of back: Secondary | ICD-10-CM

## 2023-05-30 DIAGNOSIS — M5459 Other low back pain: Secondary | ICD-10-CM

## 2023-05-30 DIAGNOSIS — R42 Dizziness and giddiness: Secondary | ICD-10-CM

## 2023-05-30 DIAGNOSIS — R262 Difficulty in walking, not elsewhere classified: Secondary | ICD-10-CM

## 2023-05-30 DIAGNOSIS — I10 Essential (primary) hypertension: Secondary | ICD-10-CM

## 2023-05-30 NOTE — Therapy (Signed)
OUTPATIENT PHYSICAL THERAPY LOWER EXTREMITY TREATMENT   Patient Name: Jason [name] MRN: [id number] DOB:[birth year], 87 y.o., male Today's Date: 05/30/2023  END OF SESSION:            Past Medical History:  Diagnosis Date   Anxiety and depression    Ascending aortic aneurysm (HCC) 01/22/2022   CAD in native artery 01/22/2022   Chronic renal insufficiency, stage 3 (moderate) (HCC)    Diabetes mellitus without complication (HCC)    Gait instability 01/22/2022   GERD (gastroesophageal reflux disease)    Gout    Hypercholesterolemia    Hypertension    Lumbar compression fracture (HCC)    L2, 20%, chronic (imaging 2024).  DEXA -1.0 04/2023   Orthostatic hypotension    OSA (obstructive sleep apnea)    Osteoarthritis of left hip    + troch burs   Prostate cancer (HCC)    1990-->prostatectomy, released from urol x many years   Urinary incontinence    Past Surgical History:  Procedure Laterality Date   CHOLECYSTECTOMY, LAPAROSCOPIC     1992   DEXA     T score -1.0 Sept 2024   ROBOT ASSISTED LAPAROSCOPIC RADICAL PROSTATECTOMY     1999   ROTATOR CUFF REPAIR     Left 1991, right 1997   TOTAL KNEE ARTHROPLASTY     bilat 2004   TRANSTHORACIC ECHOCARDIOGRAM     11/24/22, EF nl, +LVH with no WMA, grd I DD, valves ok (no amyloid changes)   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   Obstructive sleep apnea of adult 08/11/2022   Diabetic nephropathy with proteinuria (HCC) 08/11/2022   Obesity 08/11/2022   Hyperlipidemia 08/11/2022   Chronic kidney disease, stage 3a (HCC) 08/11/2022   Anxiety disorder, unspecified 08/11/2022   Diabetes mellitus (HCC) 08/11/2022   Primary malignant neoplasm of prostate (HCC) 08/02/2022   Carcinoma of prostate (HCC) 08/02/2022   Shortness of breath 02/22/2022   Pure hypercholesterolemia 02/22/2022   Pain in joint, shoulder region 02/22/2022   Other malaise and fatigue 02/22/2022   Nonexudative  age-related macular degeneration, bilateral, intermediate dry stage 02/22/2022   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   Gastroesophageal reflux disease 02/22/2022   Benign paroxysmal positional vertigo 02/22/2022   CAD in native artery 01/22/2022   Thoracic aortic aneurysm, without rupture, unspecified (HCC) 01/22/2022   Gait instability 01/22/2022   Essential hypertension 01/17/2022   Type 2 diabetes mellitus without complications (HCC) 01/17/2022   HLD (hyperlipidemia) 01/17/2022   Obstructive sleep apnea 01/17/2022   Sensorineural hearing loss, bilateral 11/09/2021   Orthostatic hypotension 11/09/2021   Imbalance 11/09/2021   Overweight 02/21/2020   First degree heart block 02/21/2020   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   Malignant tumor of prostate (HCC) 02/21/2020   Vitamin D deficiency 05/31/2016   History of malignant neoplasm of prostate 05/31/2016   History of malignant neoplasm of skin 05/31/2016   Bilateral hearing loss 05/31/2016   Anxiety state 05/31/2016   Bronchospasm 09/24/2010   Cough 09/19/2010   Acute bronchitis 09/19/2010   Allergic rhinitis due to pollen 09/19/2010   Acute sinusitis 09/19/2010    PCP: Meredith Staggers, MD  REFERRING PROVIDER: Meredith Staggers, MD  REFERRING DIAG: R26.81 (ICD-10-CM) - Unsteadiness   THERAPY DIAG:  No diagnosis found.  Rationale for Evaluation and Treatment: Rehabilitation  ONSET DATE: march 2024  SUBJECTIVE:   SUBJECTIVE STATEMENT: Pt reports no new falls since last Thursday. Feels shaky this morning. Pt had  self  corrected LOB when ambulating from chair in waiting area back to treatment area.   PERTINENT HISTORY: Type 2 diabetes, sleep apnea, thoracic aortic aneurysm, orthorstatic hypotension  PAIN:  Are you having pain? No  PRECAUTIONS: None  WEIGHT BEARING RESTRICTIONS: No  FALLS:  Has patient fallen in last 6 months? Yes. Number of falls 2, step done on a small step. Another one reaching down and fell  LIVING  ENVIRONMENT: Lives with: lives with their family Lives in: House/apartment Stairs: No Has following equipment at home: Single point cane  OCCUPATION: retired   PLOF: Independent  PATIENT GOALS: walking without pain, being more stable  NEXT MD VISIT: nothing scheduled   OBJECTIVE:      DIAGNOSTIC FINDINGS: nothing pertinent   PATIENT SURVEYS:  FOTO    COGNITION: Overall cognitive status: Within functional limits for tasks assessed     SENSATION: WFL  EDEMA:   MUSCLE LENGTH:   POSTURE: rounded shoulders  PALPATION:   LOWER EXTREMITY ROM:  Active ROM Right eval Left eval  Hip flexion    Hip extension    Hip abduction    Hip adduction    Hip internal rotation    Hip external rotation    Knee flexion    Knee extension    Ankle dorsiflexion    Ankle plantarflexion    Ankle inversion    Ankle eversion     (Blank rows = not tested)  LOWER EXTREMITY MMT:  MMT Right eval Left eval Right 6/11  Left  6/11 Right 7/23 Left 7/23 Right  9/24 Left  9/24  Hip flexion 19.5 19.3 painful 25.0 23.9 32.4 26.3 14.8 26.3  Hip extension          Hip abduction 22.5 27.1 42.1 41.2 46. 33.5 32.9 28.8  Hip adduction          Hip internal rotation          Hip external rotation          Knee flexion          Knee extension 27.4 33.2 45.1 42.5 45.4 52.3 30.8 32.2   Ankle dorsiflexion          Ankle plantarflexion          Ankle inversion          Ankle eversion           (Blank rows = not tested)  LOWER EXTREMITY SPECIAL TESTS:    5/1: Vitals 139/11mmHg 70bpm  4/26: Vitals: 166/108 at beginning Waited and took again: 168/96 Standing: 133/852 After seated exercise: 181/112 After rest:165/108 At end of session: 167/104  EVAL: Vitals:  Patient Initial blood pressure seated was 165/118.  Patient blood pressure was retaken after a few minutes and was 171/103. Patient blood pressure was also taken standing, 134/78 FUNCTIONAL TESTS:  Eval:TUG test:  16sec- No AD 5xSTS: 27sec (used arms to push up)  6/11  5XSTS 19  TUG 17 without AD   6 min walk test: 2.5 minutes until for seated rest break seated rest break of 2 minutes.  After rest break able to ambulate until 5:30 mark at which time he needed a seated rest break for the rest of the test.  Total distance 527 feet.  7/23 5x STS 13  6 min walk test 875 no rest break  After walk test 185/105   9/26  6 min walk test-742ft with cane 5xSTS- 16.10     GAIT: Patients uses a SPC to ambulate.  Balance:   6/11 Narrow base of support standby assist Narrow base of support eyes closed min assist Tandem stance min assist bilateral Single-leg stance unable to maintain  7/23 Narrow base of support standby assist Narrow base of support eyes closed min assist Tandem stance min assist bilateral Single-leg stance unable to maintain  9/24 7/23 Narrow base of support standby assist Narrow base of support eyes closed min assist Tandem stance min assist bilateral Single-leg stance unable to maintain  No change from previous visit   TODAY'S TREATMENT:                                                                                                                              DATE:  1014/ Seated 143/93  Standing 90/64  Hip abduction 3x15  Seated march 2x15  Seated LAQ 3x10  All with a red band   Bilateral er 3x15 red  Horizontal abduction 3x15 red  Wand flexion 2x15       9/26:  Beginning of session 140/90 seated BP 115/75 standing BP Seated lumbar flexion stretch with ball on plinth 10sec x5 6MWT-747ft with cane BP seated 158/99 5xSTS 16.1 sec Supine LTR x10 5sec hold Supine SKTC 20sec x2ea Supine clam GTB x20   9/24 Assessed patient low back  Manual: trigger point release to bilateral hips and lower back  LAD bilateral but patient was guarded so minimal LAD provided   LAQ 2x10 red  Hip abduction 2x10 red  Seated march red 2x10   Mild dyspnea  noted with there-ex     9/19  Manual: trigger point release to bilateral hips and lower back  LAD bilateral but patient was guarded so minimal LAD provided      9/10  B/P Baseline seated 130/94 Standing: 94/63   Seated:  Hip abduction 3x15  Seated march 2x15  Seated LAQ 3x10  All with a red band     CLINICAL IMPRESSION:  The patiens standing blood pressure was low and symptomatic. He felt increased syncope in standing. He as able to perform sitting exercises. He had mild fatigue after but no significant increase in syncope. He was advised to be careful and use his AD today with his blood pressure going so low. He tolerated seated exercises well. He reported just minor fatigue.  RE-EVAL: herapy performed assessment of patient back. He has increased pain with lumbar flexion and right rotation. He continues to have a significant spasm in his left gluteal. His right knee is sore at this time. Since the onset of his back pain his muscle strength has decreased. He has more weakness on the right side today. We were able to re-start LE exercises today with minimal pain. We continue to work on manual therapy to the low back. We will continue to work on balance, endurance, and low back pain  2W8. We did not assess 5x sit to stand or 6 min walk today 2nd to lumbar spine. We will  hopefully re-assess over the next few visits. We were abe to integrate there-ex back into his program today. He tolerated well.     OBJECTIVE IMPAIRMENTS: decreased activity tolerance, decreased balance, decreased endurance, difficulty walking, decreased strength, and dizziness.   ACTIVITY LIMITATIONS: carrying, lifting, bending, standing, squatting, sleeping, and stairs  PARTICIPATION LIMITATIONS: meal prep, cleaning, laundry, driving, shopping, community activity, and yard work  PERSONAL FACTORS: 3+ comorbidities: type 2 diabetes, orthostatic hypotension, sleep apnea, thoracic aortic anuerysm  are also affecting  patient's functional outcome.   REHAB POTENTIAL: Good  CLINICAL DECISION MAKING: Evolving/moderate complexity  EVALUATION COMPLEXITY: Moderate   GOALS: Goals reviewed with patient? Yes  SHORT TERM GOALS: Target date: 5/9 Patient will increase bilateral strength by 5lbs. Baseline: Goal status: Significant improvement in strength overall initial goal which she achieved 7/23 new goal 5 pounds from measurements on 7/23  2.  Patient will be independent with HEP. Baseline:  Goal status: Performing base HEP at home 6/12  3.  Patient will show minimal fluctuation and syncope with sit to stand transfer  Baseline:  Goal status: Minimal syncope.  Continues to have fluctuations in blood pressure  4. Patient will demonstrate full lumbar flexion and rotation without pain  Goal status : new   5. Patient will increase gross bilateral LE strength to 5/5   LONG TERM GOALS: Target date: 01/27/2023    Patient will transfer sit to stand without loss of balance or syncope  Baseline:  Goal status: MET 6/4  2.  Patient will perform daily activity with no loss of balance  Baseline:  Goal status: IN PROGRESS 6/12 improving but still some baseline balance issues goal continues to improve 7/23  3.  Patient will be able to walk community distances without any discomfort in left hip or feeling of lost balance .  Baseline:  Goal status: Mild pain in his hip but improving ability to ambulate in the community 7/23      PLAN:  PT FREQUENCY: 1-2x/week  PT DURATION: 10 weeks  PLANNED INTERVENTIONS: Therapeutic exercises, Therapeutic activity, Neuromuscular re-education, Balance training, Gait training, Patient/Family education, Self Care, Joint mobilization, Stair training, Aquatic Therapy, Electrical stimulation, Cryotherapy, Moist heat, Taping, Ultrasound, Ionotophoresis 4mg /ml Dexamethasone, and Manual therapy  PLAN FOR NEXT SESSION: Consider TUG, 5 STS test, 6 minute walk, screen balance;  adjust goals review HEP   Riki Altes, PTA   03/28/2023

## 2023-05-31 ENCOUNTER — Other Ambulatory Visit: Payer: Self-pay

## 2023-05-31 DIAGNOSIS — I951 Orthostatic hypotension: Secondary | ICD-10-CM

## 2023-05-31 MED ORDER — MIDODRINE HCL 10 MG PO TABS: ORAL_TABLET | ORAL | 2 refills | Status: AC

## 2023-06-01 ENCOUNTER — Other Ambulatory Visit: Payer: Self-pay | Admitting: Family Medicine

## 2023-06-01 ENCOUNTER — Ambulatory Visit (HOSPITAL_BASED_OUTPATIENT_CLINIC_OR_DEPARTMENT_OTHER): Payer: Medicare HMO | Admitting: Physical Therapy

## 2023-06-01 DIAGNOSIS — E1169 Type 2 diabetes mellitus with other specified complication: Secondary | ICD-10-CM

## 2023-06-01 NOTE — Telephone Encounter (Signed)
Pt has scheduled appt for 10/18 (4 wk f/u), based on last OV pt should continue this medication twice daily.

## 2023-06-03 ENCOUNTER — Ambulatory Visit (INDEPENDENT_AMBULATORY_CARE_PROVIDER_SITE_OTHER): Payer: Medicare HMO | Admitting: Urgent Care

## 2023-06-03 ENCOUNTER — Ambulatory Visit: Payer: Medicare HMO | Admitting: Family Medicine

## 2023-06-03 VITALS — BP 108/71 | HR 81 | Temp 98.4°F | Wt 262.0 lb

## 2023-06-03 DIAGNOSIS — E669 Obesity, unspecified: Secondary | ICD-10-CM

## 2023-06-03 DIAGNOSIS — R2681 Unsteadiness on feet: Secondary | ICD-10-CM

## 2023-06-03 DIAGNOSIS — I951 Orthostatic hypotension: Secondary | ICD-10-CM

## 2023-06-03 DIAGNOSIS — N1831 Chronic kidney disease, stage 3a: Secondary | ICD-10-CM

## 2023-06-03 DIAGNOSIS — Z8546 Personal history of malignant neoplasm of prostate: Secondary | ICD-10-CM

## 2023-06-03 DIAGNOSIS — E66812 Obesity, class 2: Secondary | ICD-10-CM | POA: Diagnosis not present

## 2023-06-03 DIAGNOSIS — Z7984 Long term (current) use of oral hypoglycemic drugs: Secondary | ICD-10-CM

## 2023-06-03 DIAGNOSIS — E1169 Type 2 diabetes mellitus with other specified complication: Secondary | ICD-10-CM | POA: Diagnosis not present

## 2023-06-03 DIAGNOSIS — E119 Type 2 diabetes mellitus without complications: Secondary | ICD-10-CM

## 2023-06-03 DIAGNOSIS — S32020G Wedge compression fracture of second lumbar vertebra, subsequent encounter for fracture with delayed healing: Secondary | ICD-10-CM

## 2023-06-03 DIAGNOSIS — I10 Essential (primary) hypertension: Secondary | ICD-10-CM

## 2023-06-03 NOTE — Assessment & Plan Note (Signed)
History of radical prostatectomy with some incontinence. Discussed the importance of a bladder habit schedule and potential referral to urology for further management. -Attempt to increase fluid intake while maintaining a bladder habit schedule. -Consider referral to urology if symptoms worsen with increased fluid intake.

## 2023-06-03 NOTE — Assessment & Plan Note (Signed)
Patient reports feeling wobbly and has fallen. Currently using a cane for assistance. Discussed the importance of proper cane height and potential benefit of physical therapy for gait training. -Adjust cane height to hip level. -Consider further physical therapy for gait training if instability persists.

## 2023-06-03 NOTE — Progress Notes (Signed)
Established Patient Office Visit  Subjective:  Patient ID: Jason [name], male    DOB: 09/26/1935  Age: 87 y.o. MRN: [id number]  Chief Complaint  Patient presents with   Follow-up    4 week follow up on chronic medical conditions. He states he has also been having symptoms of vertigo.    87yo male with a history of orthostatic hypertension, diabetes, CKD and prostatectomy, presents for a follow-up visit after recent medication changes. He reports ongoing therapy for orthostatic hypertension and has been experiencing episodes of vertigo, which he suspects may be related to his blood pressure dropping upon standing. The patient describes the dizziness as predictable, often occurring upon standing, and worsening if he continues to stand. He has been managing these symptoms with Midodrine and has been monitoring his blood pressure at home. He does report very little water intake, roughly 16oz daily.  The patient also reports a significant weight loss of seven pounds over the past month, which he attributes to a new injectable medication, Mounjaro. He reports tolerating the medication well, with no side effects other than the desired weight loss. He also takes Glimepiride, Metformin, and Pioglitazone for his diabetes and has been monitoring his blood sugar levels daily at home, which have been consistently between 112 and 140 fasting in the mornings.  The patient has also been experiencing some instability and has fallen in the past. He uses a cane for assistance but reports feeling wobbly at times. He has been receiving physical therapy, which has been focusing on his gait. However, he has missed some sessions due to scheduling issues and periods of feeling particularly unstable.  The patient also reports a history of radical prostatectomy and experiences occasional incontinence, which he manages with a pad. He has been trying to maintain a regular bladder habit schedule and has been considering  increasing his water intake, but is concerned about exacerbating his incontinence. He has been adding electrolytes to his fluids with an over-the-counter product similar to Pedialyte. He has not been following up with urology for his incontinence.    Past Medical History:  Diagnosis Date   Anxiety and depression    Ascending aortic aneurysm (HCC) 01/22/2022   CAD in native artery 01/22/2022   Chronic renal insufficiency, stage 3 (moderate) (HCC)    Diabetes mellitus without complication (HCC)    Gait instability 01/22/2022   GERD (gastroesophageal reflux disease)    Gout    Hypercholesterolemia    Hypertension    Lumbar compression fracture (HCC)    L2, 20%, chronic (imaging 2024).  DEXA -1.0 04/2023   Orthostatic hypotension    OSA (obstructive sleep apnea)    Osteoarthritis of left hip    + troch burs   Prostate cancer (HCC)    1990-->prostatectomy, released from urol x many years   Urinary incontinence       ROS: as noted in HPI  Objective:     BP 108/71   Pulse 81   Temp 98.4 F (36.9 C) (Oral)   Wt 262 lb (118.8 kg)   SpO2 91%   BMI 36.54 kg/m  BP Readings from Last 3 Encounters:  06/03/23 108/71  05/06/23 (!) 152/75  04/08/23 (!) 140/79   Wt Readings from Last 3 Encounters:  06/03/23 262 lb (118.8 kg)  05/06/23 269 lb (122 kg)  04/08/23 274 lb 6.4 oz (124.5 kg)      Physical Exam Vitals and nursing note reviewed.  Constitutional:      General: He  is not in acute distress.    Appearance: Normal appearance. He is obese. He is not ill-appearing, toxic-appearing or diaphoretic.  HENT:     Head: Normocephalic and atraumatic.     Right Ear: External ear normal.     Left Ear: External ear normal.     Nose: Nose normal.     Mouth/Throat:     Mouth: Mucous membranes are moist.  Eyes:     General: No scleral icterus.       Right eye: No discharge.        Left eye: No discharge.     Extraocular Movements: Extraocular movements intact.     Pupils: Pupils  are equal, round, and reactive to light.  Neck:     Thyroid: No thyroid mass, thyromegaly or thyroid tenderness.  Cardiovascular:     Rate and Rhythm: Normal rate and regular rhythm.     Pulses: Normal pulses.  Pulmonary:     Effort: Pulmonary effort is normal. No respiratory distress.     Breath sounds: Normal breath sounds. No stridor. No wheezing or rhonchi.  Musculoskeletal:     Cervical back: Normal range of motion and neck supple.     Right lower leg: No edema.     Left lower leg: No edema.  Skin:    General: Skin is warm and dry.     Coloration: Skin is not jaundiced.     Findings: No bruising, erythema or rash.  Neurological:     General: No focal deficit present.     Mental Status: He is alert and oriented to person, place, and time.     Comments: Ambulates with cane  Psychiatric:        Mood and Affect: Mood normal.        Behavior: Behavior normal.      No results found for any visits on 06/03/23.  Last metabolic panel Lab Results  Component Value Date   GLUCOSE 207 (H) 05/06/2023   NA 138 05/06/2023   K 5.3 05/06/2023   CL 101 05/06/2023   CO2 26 05/06/2023   BUN 22 05/06/2023   CREATININE 1.43 (H) 05/06/2023   GFR 51.19 (L) 01/26/2023   CALCIUM 9.6 05/06/2023   PROT 6.9 05/06/2023   ALBUMIN 4.1 01/26/2023   BILITOT 0.5 05/06/2023   ALKPHOS 73 01/26/2023   AST 24 05/06/2023   ALT 25 05/06/2023   Last hemoglobin A1c Lab Results  Component Value Date   HGBA1C 7.0 (H) 05/06/2023      The ASCVD Risk score (Arnett DK, et al., 2019) failed to calculate for the following reasons:   The 2019 ASCVD risk score is only valid for ages 80 to 39  Assessment & Plan:  Type 2 diabetes mellitus with obesity (HCC) Assessment & Plan: Improved A1c from four months prior. Currently on Glimepiride 1mg  daily, Metformin 1000mg  daily, Pioglitazone 15mg  daily, and Mounjaro 5mg  daily. Patient has lost weight and is tolerating the medications well. -Continue current  medication regimen. -Check blood sugar daily, aiming for a range of 100-140. -Check A1c in 3.5-4 months.     Orthostatic hypotension Assessment & Plan: Experiencing dizziness and vertigo, particularly upon standing. Currently on Midodrine. Discussed the importance of adequate hydration and slow transitions from sitting to standing. Also discussed the potential benefit of compression stockings. -Increase fluid intake to aim for four 16-ounce bottles of water daily. -Resume use of compression stockings for a trial period of one to two weeks. -Continue Midodrine as prescribed. -  Continue slow transitions from sitting to standing.   Compression fracture of L2 vertebra with delayed healing, subsequent encounter  Essential hypertension  Stage 3a chronic kidney disease (HCC)  Appears stable. Increasing water will likely help with this.  Obesity, Class II, BMI 35-39.9  Weight loss of 12# since starting Mounjaro noted.  Type 2 diabetes mellitus without complication, without long-term current use of insulin (HCC)  Gait instability Assessment & Plan: Patient reports feeling wobbly and has fallen. Currently using a cane for assistance. Discussed the importance of proper cane height and potential benefit of physical therapy for gait training. -Adjust cane height to hip level. -Consider further physical therapy for gait training if instability persists.   History of malignant neoplasm of prostate Assessment & Plan: History of radical prostatectomy with some incontinence. Discussed the importance of a bladder habit schedule and potential referral to urology for further management. -Attempt to increase fluid intake while maintaining a bladder habit schedule. -Consider referral to urology if symptoms worsen with increased fluid intake.      Return in about 3 months (around 09/03/2023) for routine DM follow up.   Maretta Bees, PA

## 2023-06-03 NOTE — Patient Instructions (Signed)
Pleasure meeting you today Jason [name]!   There are no changes to your plan of care. Keep taking the medications as prescribed and monitoring your glucose daily.  Try to increase your water intake. Consider wearing compression stockings and make slow transitions when going from laying to sitting to standing. This will help prevent orthostasis.  You are due for repeat labs in 3 months. Please return then for recheck. Continue working with PT for gait training and stability. Please make sure your cane is at hip bone height to prevent leaning forward.

## 2023-06-03 NOTE — Progress Notes (Deleted)
OFFICE VISIT  06/03/2023  CC: No chief complaint on file.   Patient is a 87 y.o. male who presents for 4-week follow-up weight management and diabetes. A/P as of last visit: "#1 weight management. Has lost 6 pounds on Mounjaro and is tolerating the 2.5 mg weekly dose. Increase to 5 mg weekly dose.  Recheck 1 month.   2.  Diabetes without complication. Glucoses good in the last week. Continue metformin 500 mg twice daily and glimepiride 4 mg a day. Increase Mounjaro as per #1 above. Hemoglobin A1c today.  Next urine microalbumin/creatinine due March 2025.   #3 gait instability.  Chronic bilateral low back pain. New PT referral placed today.   4.  Question of osteoporosis. L2 compression, 20%, chronic (imaging 2024).  DEXA -1.0 04/2023. Given this discordant data, he is in the processing of making appointment with endocrinology for further evaluation and management.   #5 gustatory rhinitis. He has had some improvement with ipratropium nasal spray.   6.  Chronic renal insufficiency stage III. Avoid NSAIDs.  Hydrate well. Monitor electrolytes and creatinine today."  INTERIM HX: A1c was 7% last visit.  Serum creatinine stable at 1.43.  All other labs were normal.  ***  Past Medical History:  Diagnosis Date   Anxiety and depression    Ascending aortic aneurysm (HCC) 01/22/2022   CAD in native artery 01/22/2022   Chronic renal insufficiency, stage 3 (moderate) (HCC)    Diabetes mellitus without complication (HCC)    Gait instability 01/22/2022   GERD (gastroesophageal reflux disease)    Gout    Hypercholesterolemia    Hypertension    Lumbar compression fracture (HCC)    L2, 20%, chronic (imaging 2024).  DEXA -1.0 04/2023   Orthostatic hypotension    OSA (obstructive sleep apnea)    Osteoarthritis of left hip    + troch burs   Prostate cancer (HCC)    1990-->prostatectomy, released from urol x many years   Urinary incontinence     Past Surgical History:  Procedure  Laterality Date   CHOLECYSTECTOMY, LAPAROSCOPIC     1992   DEXA     T score -1.0 Sept 2024   ROBOT ASSISTED LAPAROSCOPIC RADICAL PROSTATECTOMY     1999   ROTATOR CUFF REPAIR     Left 1991, right 1997   TOTAL KNEE ARTHROPLASTY     bilat 2004   TRANSTHORACIC ECHOCARDIOGRAM     11/24/22, EF nl, +LVH with no WMA, grd I DD, valves ok (no amyloid changes)    Outpatient Medications Prior to Visit  Medication Sig Dispense Refill   allopurinol (ZYLOPRIM) 300 MG tablet Take 1 tablet (300 mg total) by mouth daily. 90 tablet 1   aspirin EC 81 MG tablet Take 1 tablet every day by oral route.     atorvastatin (LIPITOR) 40 MG tablet Take 1 tablet (40 mg total) by mouth daily. 90 tablet 1   b complex vitamins capsule Take 1 capsule by mouth daily.     fluticasone (FLONASE) 50 MCG/ACT nasal spray Place 1-2 sprays into both nostrils daily. 16 g 6   glimepiride (AMARYL) 1 MG tablet Take 1 tablet (1 mg total) by mouth daily with breakfast. 90 tablet 1   glucose blood test strip E11.9 Use to test blood sugar twice daily or as needed 100 each 12   ipratropium (ATROVENT) 0.03 % nasal spray PLACE 2 SPRAYS INTO BOTH NOSTRILS EVERY 12 (TWELVE) HOURS. USE 15 TO 30 MIN PRIOR TO BREAKFAST AND SUPPER 30  mL 6   Lancet Device MISC Dispense new lancet device based on patient insurance and current one touch delica products 1 each 0   losartan (COZAAR) 25 MG tablet Take 1 tablet (25 mg total) by mouth at bedtime. 90 tablet 2   metFORMIN (GLUCOPHAGE) 500 MG tablet TAKE 2 TABLETS (1,000 MG TOTAL) BY MOUTH 2 (TWO) TIMES DAILY WITH A MEAL. 360 tablet 0   metoprolol tartrate (LOPRESSOR) 25 MG tablet TAKE 1 TABLET BY MOUTH TWICE A DAY 180 tablet 3   midodrine (PROAMATINE) 10 MG tablet TAKE 1 TABLET BEFORE GETTING OUT OF BED OR SOON AFTER, 1 TABLET AT 12PM, 1 TABLET AROUND 4PM 270 tablet 2   Multiple Vitamins-Minerals (ICAPS AREDS 2 PO) TAKE 1 CAPSULE BY MOUTH TWICE A DAY FOR MACULAR DEGENERATION     omeprazole (PRILOSEC) 20 MG  capsule Take 1 capsule (20 mg total) by mouth daily. 90 capsule 1   OneTouch Delica Lancets 33G MISC E11.9 Use to test blood sugar twice daily or as needed 100 each 5   pioglitazone (ACTOS) 15 MG tablet Take 1 tablet (15 mg total) by mouth daily. 90 tablet 1   tirzepatide (MOUNJARO) 5 MG/0.5ML Pen Inject 5 mg into the skin once a week. 6 mL 0   venlafaxine (EFFEXOR) 75 MG tablet Take 1 tablet (75 mg total) by mouth daily. 90 tablet 1   No facility-administered medications prior to visit.    Allergies  Allergen Reactions   Lisinopril Cough, Swelling and Other (See Comments)    Review of Systems As per HPI  PE:    05/06/2023   10:31 AM 04/08/2023    1:32 PM 04/08/2023    1:25 PM  Vitals with BMI  Height 5\' 11"   5\' 11"   Weight 269 lbs  274 lbs 6 oz  BMI 37.53  38.29  Systolic 152 140 161  Diastolic 75 79 71  Pulse 72  69     Physical Exam  ***  LABS:  Last CBC Lab Results  Component Value Date   WBC 7.9 05/06/2023   HGB 13.5 05/06/2023   HCT 42.0 05/06/2023   MCV 90.3 05/06/2023   MCH 29.0 05/06/2023   RDW 13.8 05/06/2023   PLT 276 05/06/2023   Last metabolic panel Lab Results  Component Value Date   GLUCOSE 207 (H) 05/06/2023   NA 138 05/06/2023   K 5.3 05/06/2023   CL 101 05/06/2023   CO2 26 05/06/2023   BUN 22 05/06/2023   CREATININE 1.43 (H) 05/06/2023   GFR 51.19 (L) 01/26/2023   CALCIUM 9.6 05/06/2023   PROT 6.9 05/06/2023   ALBUMIN 4.1 01/26/2023   BILITOT 0.5 05/06/2023   ALKPHOS 73 01/26/2023   AST 24 05/06/2023   ALT 25 05/06/2023   Last hemoglobin A1c Lab Results  Component Value Date   HGBA1C 7.0 (H) 05/06/2023   IMPRESSION AND PLAN:  No problem-specific Assessment & Plan notes found for this encounter.  ? No labs needed  An After Visit Summary was printed and given to the patient.  FOLLOW UP: No follow-ups on file.  Signed:  Santiago Bumpers, MD           06/03/2023

## 2023-06-03 NOTE — Assessment & Plan Note (Signed)
Improved A1c from four months prior. Currently on Glimepiride 1mg  daily, Metformin 1000mg  daily, Pioglitazone 15mg  daily, and Mounjaro 5mg  daily. Patient has lost weight and is tolerating the medications well. -Continue current medication regimen. -Check blood sugar daily, aiming for a range of 100-140. -Check A1c in 3.5-4 months.

## 2023-06-03 NOTE — Assessment & Plan Note (Deleted)
Improved A1c from four months prior. Currently on Glimepiride 1mg  daily, Metformin 1000mg  daily, Pioglitazone 15mg  daily, and Mounjaro 5mg  daily. Patient has lost weight and is tolerating the medications well. -Continue current medication regimen. -Check blood sugar daily, aiming for a range of 100-140. -Check A1c in 3.5-4 months.

## 2023-06-03 NOTE — Assessment & Plan Note (Signed)
Experiencing dizziness and vertigo, particularly upon standing. Currently on Midodrine. Discussed the importance of adequate hydration and slow transitions from sitting to standing. Also discussed the potential benefit of compression stockings. -Increase fluid intake to aim for four 16-ounce bottles of water daily. -Resume use of compression stockings for a trial period of one to two weeks. -Continue Midodrine as prescribed. -Continue slow transitions from sitting to standing.

## 2023-06-06 ENCOUNTER — Encounter (HOSPITAL_BASED_OUTPATIENT_CLINIC_OR_DEPARTMENT_OTHER): Payer: Self-pay

## 2023-06-06 ENCOUNTER — Ambulatory Visit (HOSPITAL_BASED_OUTPATIENT_CLINIC_OR_DEPARTMENT_OTHER): Payer: Medicare HMO

## 2023-06-06 DIAGNOSIS — R2681 Unsteadiness on feet: Secondary | ICD-10-CM | POA: Diagnosis not present

## 2023-06-06 DIAGNOSIS — M5459 Other low back pain: Secondary | ICD-10-CM | POA: Diagnosis not present

## 2023-06-06 DIAGNOSIS — R262 Difficulty in walking, not elsewhere classified: Secondary | ICD-10-CM | POA: Diagnosis not present

## 2023-06-06 DIAGNOSIS — R42 Dizziness and giddiness: Secondary | ICD-10-CM

## 2023-06-06 DIAGNOSIS — M62830 Muscle spasm of back: Secondary | ICD-10-CM | POA: Diagnosis not present

## 2023-06-06 NOTE — Therapy (Signed)
OUTPATIENT PHYSICAL THERAPY LOWER EXTREMITY TREATMENT   Patient Name: Jason [name] MRN: [id number] DOB:[birth year], 87 y.o., male Today's Date: 06/06/2023  END OF SESSION:  PT End of Session - 06/06/23 1101     Visit Number 29    Number of Visits 42    Date for PT Re-Evaluation 07/05/23    PT Start Time 1100    PT Stop Time 1140    PT Time Calculation (min) 40 min    Activity Tolerance Patient tolerated treatment well    Behavior During Therapy [facility] for tasks assessed/performed                      Past Medical History:  Diagnosis Date   Anxiety and depression    Ascending aortic aneurysm (HCC) 01/22/2022   CAD in native artery 01/22/2022   Chronic renal insufficiency, stage 3 (moderate) (HCC)    Diabetes mellitus without complication (HCC)    Gait instability 01/22/2022   GERD (gastroesophageal reflux disease)    Gout    Hypercholesterolemia    Hypertension    Lumbar compression fracture (HCC)    L2, 20%, chronic (imaging 2024).  DEXA -1.0 04/2023   Orthostatic hypotension    OSA (obstructive sleep apnea)    Osteoarthritis of left hip    + troch burs   Prostate cancer (HCC)    1990-->prostatectomy, released from urol x many years   Urinary incontinence    Past Surgical History:  Procedure Laterality Date   CHOLECYSTECTOMY, LAPAROSCOPIC     1992   DEXA     T score -1.0 Sept 2024   ROBOT ASSISTED LAPAROSCOPIC RADICAL PROSTATECTOMY     1999   ROTATOR CUFF REPAIR     Left 1991, right 1997   TOTAL KNEE ARTHROPLASTY     bilat 2004   TRANSTHORACIC ECHOCARDIOGRAM     11/24/22, EF nl, +LVH with no WMA, grd I DD, valves ok (no amyloid changes)   Patient Active Problem List   Diagnosis Date Noted   Type 2 diabetes mellitus with obesity (HCC) 06/03/2023   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   Obstructive sleep apnea of adult 08/11/2022   Diabetic nephropathy with proteinuria (HCC) 08/11/2022   Obesity 08/11/2022   Hyperlipidemia 08/11/2022   Stage 3a chronic  kidney disease (HCC) 08/11/2022   Anxiety disorder, unspecified 08/11/2022   Diabetes mellitus (HCC) 08/11/2022   Primary malignant neoplasm of prostate (HCC) 08/02/2022   Carcinoma of prostate (HCC) 08/02/2022   Shortness of breath 02/22/2022   Pure hypercholesterolemia 02/22/2022   Pain in joint, shoulder region 02/22/2022   Other malaise and fatigue 02/22/2022   Nonexudative age-related macular degeneration, bilateral, intermediate dry stage 02/22/2022   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   Gastroesophageal reflux disease 02/22/2022   Benign paroxysmal positional vertigo 02/22/2022   CAD in native artery 01/22/2022   Thoracic aortic aneurysm, without rupture, unspecified (HCC) 01/22/2022   Gait instability 01/22/2022   Essential hypertension 01/17/2022   Type 2 diabetes mellitus without complications (HCC) 01/17/2022   HLD (hyperlipidemia) 01/17/2022   Obstructive sleep apnea 01/17/2022   Sensorineural hearing loss, bilateral 11/09/2021   Orthostatic hypotension 11/09/2021   Imbalance 11/09/2021   Overweight 02/21/2020   First degree heart block 02/21/2020   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   Malignant tumor of prostate (HCC) 02/21/2020   Vitamin D deficiency 05/31/2016   History of malignant neoplasm of prostate 05/31/2016   History of malignant neoplasm of skin 05/31/2016   Bilateral hearing loss 05/31/2016  Anxiety state 05/31/2016   Bronchospasm 09/24/2010   Cough 09/19/2010   Acute bronchitis 09/19/2010   Allergic rhinitis due to pollen 09/19/2010   Acute sinusitis 09/19/2010    PCP: Meredith Staggers, MD  REFERRING PROVIDER: Meredith Staggers, MD  REFERRING DIAG: R26.81 (ICD-10-CM) - Unsteadiness   THERAPY DIAG:  Difficulty in walking, not elsewhere classified  Dizziness and giddiness  Unsteadiness on feet  Rationale for Evaluation and Treatment: Rehabilitation  ONSET DATE: march 2024  SUBJECTIVE:   SUBJECTIVE STATEMENT: Pt denies any falls recently. "I feel  good today". Has resumed wearing compression socks per MD.   PERTINENT HISTORY: Type 2 diabetes, sleep apnea, thoracic aortic aneurysm, orthorstatic hypotension  PAIN:  Are you having pain? No  PRECAUTIONS: None  WEIGHT BEARING RESTRICTIONS: No  FALLS:  Has patient fallen in last 6 months? Yes. Number of falls 2, step done on a small step. Another one reaching down and fell  LIVING ENVIRONMENT: Lives with: lives with their family Lives in: House/apartment Stairs: No Has following equipment at home: Single point cane  OCCUPATION: retired   PLOF: Independent  PATIENT GOALS: walking without pain, being more stable  NEXT MD VISIT: nothing scheduled   OBJECTIVE:      DIAGNOSTIC FINDINGS: nothing pertinent   PATIENT SURVEYS:  FOTO    COGNITION: Overall cognitive status: Within functional limits for tasks assessed     SENSATION: WFL  EDEMA:   MUSCLE LENGTH:   POSTURE: rounded shoulders  PALPATION:   LOWER EXTREMITY ROM:  Active ROM Right eval Left eval  Hip flexion    Hip extension    Hip abduction    Hip adduction    Hip internal rotation    Hip external rotation    Knee flexion    Knee extension    Ankle dorsiflexion    Ankle plantarflexion    Ankle inversion    Ankle eversion     (Blank rows = not tested)  LOWER EXTREMITY MMT:  MMT Right eval Left eval Right 6/11  Left  6/11 Right 7/23 Left 7/23 Right  9/24 Left  9/24  Hip flexion 19.5 19.3 painful 25.0 23.9 32.4 26.3 14.8 26.3  Hip extension          Hip abduction 22.5 27.1 42.1 41.2 46. 33.5 32.9 28.8  Hip adduction          Hip internal rotation          Hip external rotation          Knee flexion          Knee extension 27.4 33.2 45.1 42.5 45.4 52.3 30.8 32.2   Ankle dorsiflexion          Ankle plantarflexion          Ankle inversion          Ankle eversion           (Blank rows = not tested)  LOWER EXTREMITY SPECIAL TESTS:    EVAL: Vitals:  Patient Initial blood  pressure seated was 165/118.  Patient blood pressure was retaken after a few minutes and was 171/103. Patient blood pressure was also taken standing, 134/78 FUNCTIONAL TESTS:  Eval:TUG test: 16sec- No AD 5xSTS: 27sec (used arms to push up)  6/11  5XSTS 19  TUG 17 without AD   6 min walk test: 2.5 minutes until for seated rest break seated rest break of 2 minutes.  After rest break able to ambulate until 5:30 mark at which  time he needed a seated rest break for the rest of the test.  Total distance 527 feet.  7/23 5x STS 13  6 min walk test 875 no rest break  After walk test 185/105   9/26  6 min walk test-766ft with cane 5xSTS- 16.10     GAIT: Patients uses a SPC to ambulate.  Balance:   6/11 Narrow base of support standby assist Narrow base of support eyes closed min assist Tandem stance min assist bilateral Single-leg stance unable to maintain  7/23 Narrow base of support standby assist Narrow base of support eyes closed min assist Tandem stance min assist bilateral Single-leg stance unable to maintain  9/24 7/23 Narrow base of support standby assist Narrow base of support eyes closed min assist Tandem stance min assist bilateral Single-leg stance unable to maintain  No change from previous visit   TODAY'S TREATMENT:                                                                                                                              DATE:   06/06/23: Seated: 127/83 Standing: 81/64 Nu-step L4 Hip abduction 3x15  RTB Seated march 2x15 RTB Seated LAQ 3x10 2.5#weights  Seated: 120/84 Standing:123/110   1014/ Seated 143/93  Standing 90/64  Hip abduction 3x15  Seated march 2x15  Seated LAQ 3x10  All with a red band   Bilateral er 3x15 red  Horizontal abduction 3x15 red  Wand flexion 2x15       9/26:  Beginning of session 140/90 seated BP 115/75 standing BP Seated lumbar flexion stretch with ball on plinth 10sec  x5 [id number] with cane BP seated 158/99 5xSTS 16.1 sec Supine LTR x10 5sec hold Supine SKTC 20sec x2ea Supine clam GTB x20   9/24 Assessed patient low back  Manual: trigger point release to bilateral hips and lower back  LAD bilateral but patient was guarded so minimal LAD provided   LAQ 2x10 red  Hip abduction 2x10 red  Seated march red 2x10   Mild dyspnea noted with there-ex     9/19  Manual: trigger point release to bilateral hips and lower back  LAD bilateral but patient was guarded so minimal LAD provided      9/10  B/P Baseline seated 130/94 Standing: 94/63   Seated:  Hip abduction 3x15  Seated march 2x15  Seated LAQ 3x10  All with a red band     CLINICAL IMPRESSION:  Blood pressure taken upon entry to clinic. Excellent reading with seated position, though dropped to 81/64 when taken in standing. Pt asymptomatic in standing, but closely monitored throughout session. After performing seated exercises, BP was re measured with significant spike in standing BP. He did report mild light headedness at this time. Notified PCP of BP fluctuations today. Will continue to progress with PT as allowed by BP readings.    RE-EVAL: herapy performed assessment of patient back. He has increased pain with lumbar  flexion and right rotation. He continues to have a significant spasm in his left gluteal. His right knee is sore at this time. Since the onset of his back pain his muscle strength has decreased. He has more weakness on the right side today. We were able to re-start LE exercises today with minimal pain. We continue to work on manual therapy to the low back. We will continue to work on balance, endurance, and low back pain  2W8. We did not assess 5x sit to stand or 6 min walk today 2nd to lumbar spine. We will hopefully re-assess over the next few visits. We were abe to integrate there-ex back into his program today. He tolerated well.     OBJECTIVE IMPAIRMENTS:  decreased activity tolerance, decreased balance, decreased endurance, difficulty walking, decreased strength, and dizziness.   ACTIVITY LIMITATIONS: carrying, lifting, bending, standing, squatting, sleeping, and stairs  PARTICIPATION LIMITATIONS: meal prep, cleaning, laundry, driving, shopping, community activity, and yard work  PERSONAL FACTORS: 3+ comorbidities: type 2 diabetes, orthostatic hypotension, sleep apnea, thoracic aortic anuerysm  are also affecting patient's functional outcome.   REHAB POTENTIAL: Good  CLINICAL DECISION MAKING: Evolving/moderate complexity  EVALUATION COMPLEXITY: Moderate   GOALS: Goals reviewed with patient? Yes  SHORT TERM GOALS: Target date: 5/9 Patient will increase bilateral strength by 5lbs. Baseline: Goal status: Significant improvement in strength overall initial goal which she achieved 7/23 new goal 5 pounds from measurements on 7/23  2.  Patient will be independent with HEP. Baseline:  Goal status: Performing base HEP at home 6/12  3.  Patient will show minimal fluctuation and syncope with sit to stand transfer  Baseline:  Goal status: Minimal syncope.  Continues to have fluctuations in blood pressure  4. Patient will demonstrate full lumbar flexion and rotation without pain  Goal status : new   5. Patient will increase gross bilateral LE strength to 5/5   LONG TERM GOALS: Target date: 01/27/2023    Patient will transfer sit to stand without loss of balance or syncope  Baseline:  Goal status: MET 6/4  2.  Patient will perform daily activity with no loss of balance  Baseline:  Goal status: IN PROGRESS 6/12 improving but still some baseline balance issues goal continues to improve 7/23  3.  Patient will be able to walk community distances without any discomfort in left hip or feeling of lost balance .  Baseline:  Goal status: Mild pain in his hip but improving ability to ambulate in the community 7/23      PLAN:  PT  FREQUENCY: 1-2x/week  PT DURATION: 10 weeks  PLANNED INTERVENTIONS: Therapeutic exercises, Therapeutic activity, Neuromuscular re-education, Balance training, Gait training, Patient/Family education, Self Care, Joint mobilization, Stair training, Aquatic Therapy, Electrical stimulation, Cryotherapy, Moist heat, Taping, Ultrasound, Ionotophoresis 4mg /ml Dexamethasone, and Manual therapy  PLAN FOR NEXT SESSION: Consider TUG, 5 STS test, 6 minute walk, screen balance; adjust goals review HEP   Riki Altes, PTA   06/06/23

## 2023-06-07 ENCOUNTER — Telehealth: Payer: Self-pay | Admitting: Family Medicine

## 2023-06-07 DIAGNOSIS — E119 Type 2 diabetes mellitus without complications: Secondary | ICD-10-CM

## 2023-06-07 NOTE — Telephone Encounter (Signed)
Patient is needing a new script for Freestyle libre machine to test his blood sugar. The correct pharmacy is [facility] in [place]

## 2023-06-08 MED ORDER — FREESTYLE LIBRE 3 SENSOR MISC: 1.0000 | 2 refills | Status: DC

## 2023-06-08 MED ORDER — FREESTYLE LIBRE 3 READER DEVI: 1.0000 | 2 refills | Status: AC

## 2023-06-08 NOTE — Telephone Encounter (Signed)
MyChart message read.

## 2023-06-09 ENCOUNTER — Encounter (HOSPITAL_BASED_OUTPATIENT_CLINIC_OR_DEPARTMENT_OTHER): Payer: Self-pay | Admitting: Physical Therapy

## 2023-06-09 ENCOUNTER — Ambulatory Visit (HOSPITAL_BASED_OUTPATIENT_CLINIC_OR_DEPARTMENT_OTHER): Payer: Medicare HMO | Admitting: Physical Therapy

## 2023-06-09 DIAGNOSIS — M5459 Other low back pain: Secondary | ICD-10-CM

## 2023-06-09 DIAGNOSIS — R42 Dizziness and giddiness: Secondary | ICD-10-CM | POA: Diagnosis not present

## 2023-06-09 DIAGNOSIS — R262 Difficulty in walking, not elsewhere classified: Secondary | ICD-10-CM | POA: Diagnosis not present

## 2023-06-09 DIAGNOSIS — R2681 Unsteadiness on feet: Secondary | ICD-10-CM

## 2023-06-09 DIAGNOSIS — M62830 Muscle spasm of back: Secondary | ICD-10-CM | POA: Diagnosis not present

## 2023-06-09 NOTE — Therapy (Signed)
OUTPATIENT PHYSICAL THERAPY LOWER EXTREMITY TREATMENT   Patient Name: Jason [name] MRN: [id number] DOB:05/28/1936, 87 y.o., male Today's Date: 06/09/2023  END OF SESSION:  PT End of Session - 06/09/23 1202     Visit Number 30    Number of Visits 42    Date for PT Re-Evaluation 07/05/23    Authorization Type progress note at 34.    PT Start Time 1148    PT Stop Time 1228    PT Time Calculation (min) 40 min    Activity Tolerance Patient tolerated treatment well    Behavior During Therapy WFL for tasks assessed/performed                      Past Medical History:  Diagnosis Date   Anxiety and depression    Ascending aortic aneurysm (HCC) 01/22/2022   CAD in native artery 01/22/2022   Chronic renal insufficiency, stage 3 (moderate) (HCC)    Diabetes mellitus without complication (HCC)    Gait instability 01/22/2022   GERD (gastroesophageal reflux disease)    Gout    Hypercholesterolemia    Hypertension    Lumbar compression fracture (HCC)    L2, 20%, chronic (imaging 2024).  DEXA -1.0 04/2023   Orthostatic hypotension    OSA (obstructive sleep apnea)    Osteoarthritis of left hip    + troch burs   Prostate cancer (HCC)    1990-->prostatectomy, released from urol x many years   Urinary incontinence    Past Surgical History:  Procedure Laterality Date   CHOLECYSTECTOMY, LAPAROSCOPIC     1992   DEXA     T score -1.0 Sept 2024   ROBOT ASSISTED LAPAROSCOPIC RADICAL PROSTATECTOMY     1999   ROTATOR CUFF REPAIR     Left 1991, right 1997   TOTAL KNEE ARTHROPLASTY     bilat 2004   TRANSTHORACIC ECHOCARDIOGRAM     11/24/22, EF nl, +LVH with no WMA, grd I DD, valves ok (no amyloid changes)   Patient Active Problem List   Diagnosis Date Noted   Type 2 diabetes mellitus with obesity (HCC) 06/03/2023   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   Obstructive sleep apnea of adult 08/11/2022   Diabetic nephropathy with proteinuria (HCC) 08/11/2022   Obesity 08/11/2022    Hyperlipidemia 08/11/2022   Stage 3a chronic kidney disease (HCC) 08/11/2022   Anxiety disorder, unspecified 08/11/2022   Diabetes mellitus (HCC) 08/11/2022   Primary malignant neoplasm of prostate (HCC) 08/02/2022   Carcinoma of prostate (HCC) 08/02/2022   Shortness of breath 02/22/2022   Pure hypercholesterolemia 02/22/2022   Pain in joint, shoulder region 02/22/2022   Other malaise and fatigue 02/22/2022   Nonexudative age-related macular degeneration, bilateral, intermediate dry stage 02/22/2022   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   Gastroesophageal reflux disease 02/22/2022   Benign paroxysmal positional vertigo 02/22/2022   CAD in native artery 01/22/2022   Thoracic aortic aneurysm, without rupture, unspecified (HCC) 01/22/2022   Gait instability 01/22/2022   Essential hypertension 01/17/2022   Type 2 diabetes mellitus without complications (HCC) 01/17/2022   HLD (hyperlipidemia) 01/17/2022   Obstructive sleep apnea 01/17/2022   Sensorineural hearing loss, bilateral 11/09/2021   Orthostatic hypotension 11/09/2021   Imbalance 11/09/2021   Overweight 02/21/2020   First degree heart block 02/21/2020   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   Malignant tumor of prostate (HCC) 02/21/2020   Vitamin D deficiency 05/31/2016   History of malignant neoplasm of prostate 05/31/2016   History of malignant neoplasm of  skin 05/31/2016   Bilateral hearing loss 05/31/2016   Anxiety state 05/31/2016   Bronchospasm 09/24/2010   Cough 09/19/2010   Acute bronchitis 09/19/2010   Allergic rhinitis due to pollen 09/19/2010   Acute sinusitis 09/19/2010    PCP: Meredith Staggers, MD  REFERRING PROVIDER: Meredith Staggers, MD  REFERRING DIAG: R26.81 (ICD-10-CM) - Unsteadiness   THERAPY DIAG:  No diagnosis found.  Rationale for Evaluation and Treatment: Rehabilitation  ONSET DATE: march 2024  SUBJECTIVE:   SUBJECTIVE STATEMENT: Patient reports he did not sleep well last night.  He feels fatigued.   Feels like his balance is off.  No pain at this time.  PERTINENT HISTORY: Type 2 diabetes, sleep apnea, thoracic aortic aneurysm, orthorstatic hypotension  PAIN:  Are you having pain? No  PRECAUTIONS: None  WEIGHT BEARING RESTRICTIONS: No  FALLS:  Has patient fallen in last 6 months? Yes. Number of falls 2, step done on a small step. Another one reaching down and fell  LIVING ENVIRONMENT: Lives with: lives with their family Lives in: House/apartment Stairs: No Has following equipment at home: Single point cane  OCCUPATION: retired   PLOF: Independent  PATIENT GOALS: walking without pain, being more stable  NEXT MD VISIT: nothing scheduled   OBJECTIVE:      DIAGNOSTIC FINDINGS: nothing pertinent   PATIENT SURVEYS:  FOTO    COGNITION: Overall cognitive status: Within functional limits for tasks assessed     SENSATION: WFL  EDEMA:   MUSCLE LENGTH:   POSTURE: rounded shoulders  PALPATION:   LOWER EXTREMITY ROM:  Active ROM Right eval Left eval  Hip flexion    Hip extension    Hip abduction    Hip adduction    Hip internal rotation    Hip external rotation    Knee flexion    Knee extension    Ankle dorsiflexion    Ankle plantarflexion    Ankle inversion    Ankle eversion     (Blank rows = not tested)  LOWER EXTREMITY MMT:  MMT Right eval Left eval Right 6/11  Left  6/11 Right 7/23 Left 7/23 Right  9/24 Left  9/24  Hip flexion 19.5 19.3 painful 25.0 23.9 32.4 26.3 14.8 26.3  Hip extension          Hip abduction 22.5 27.1 42.1 41.2 46. 33.5 32.9 28.8  Hip adduction          Hip internal rotation          Hip external rotation          Knee flexion          Knee extension 27.4 33.2 45.1 42.5 45.4 52.3 30.8 32.2   Ankle dorsiflexion          Ankle plantarflexion          Ankle inversion          Ankle eversion           (Blank rows = not tested)  LOWER EXTREMITY SPECIAL TESTS:    EVAL: Vitals:  Patient Initial blood  pressure seated was 165/118.  Patient blood pressure was retaken after a few minutes and was 171/103. Patient blood pressure was also taken standing, 134/78 FUNCTIONAL TESTS:  Eval:TUG test: 16sec- No AD 5xSTS: 27sec (used arms to push up)  6/11  5XSTS 19  TUG 17 without AD   6 min walk test: 2.5 minutes until for seated rest break seated rest break of 2 minutes.  After rest break  able to ambulate until 5:30 mark at which time he needed a seated rest break for the rest of the test.  Total distance 527 feet.  7/23 5x STS 13  6 min walk test 875 no rest break  After walk test 185/105   9/26  6 min walk test-751ft with cane 5xSTS- 16.10     GAIT: Patients uses a SPC to ambulate.  Balance:   6/11 Narrow base of support standby assist Narrow base of support eyes closed min assist Tandem stance min assist bilateral Single-leg stance unable to maintain  7/23 Narrow base of support standby assist Narrow base of support eyes closed min assist Tandem stance min assist bilateral Single-leg stance unable to maintain  9/24 7/23 Narrow base of support standby assist Narrow base of support eyes closed min assist Tandem stance min assist bilateral Single-leg stance unable to maintain  No change from previous visit   TODAY'S TREATMENT:                                                                                                                              DATE:  10/24 Seated: 113/80 Standing: 86/69  Later in session  Seated 126/80 Standing: 86/89  Punches 2 lbs 3x15  Biceps curls 2lbs 3x15  Hip abduction 3x15  RTB Seated march 2x15 RTB Seated LAQ 3x10 2.5#weights  Nu-step 5 min   06/06/23: Seated: 127/83 Standing: 81/64 Nu-step L4 Hip abduction 3x15  RTB Seated march 2x15 RTB Seated LAQ 3x10 2.5#weights  Seated: 120/84 Standing:123/110   1014/ Seated 143/93  Standing 90/64  Hip abduction 3x15  Seated march 2x15  Seated LAQ 3x10  All  with a red band   Bilateral er 3x15 red  Horizontal abduction 3x15 red  Wand flexion 2x15       9/26:  Beginning of session 140/90 seated BP 115/75 standing BP Seated lumbar flexion stretch with ball on plinth 10sec x5 6MWT-770ft with cane BP seated 158/99 5xSTS 16.1 sec Supine LTR x10 5sec hold Supine SKTC 20sec x2ea Supine clam GTB x20   9/24 Assessed patient low back  Manual: trigger point release to bilateral hips and lower back  LAD bilateral but patient was guarded so minimal LAD provided   LAQ 2x10 red  Hip abduction 2x10 red  Seated march red 2x10   Mild dyspnea noted with there-ex     9/19  Manual: trigger point release to bilateral hips and lower back  LAD bilateral but patient was guarded so minimal LAD provided      9/10  B/P Baseline seated 130/94 Standing: 94/63   Seated:  Hip abduction 3x15  Seated march 2x15  Seated LAQ 3x10  All with a red band     CLINICAL IMPRESSION:  Patient reported feeling fatigue before entering the clinic today.  He was able to tolerate exercises.  We did not do standing exercises today.  His blood pressure dropped significantly low in a standing  position.  We hoped over the next few weeks be able to progress into standing activities and dynamic mobility activities.  He becomes symptomatic when his blood pressure drops that low.  He is still able to complete seated exercises with minimal change in his blood pressure. RE-EVAL: herapy performed assessment of patient back. He has increased pain with lumbar flexion and right rotation. He continues to have a significant spasm in his left gluteal. His right knee is sore at this time. Since the onset of his back pain his muscle strength has decreased. He has more weakness on the right side today. We were able to re-start LE exercises today with minimal pain. We continue to work on manual therapy to the low back. We will continue to work on balance, endurance, and low back  pain  2W8. We did not assess 5x sit to stand or 6 min walk today 2nd to lumbar spine. We will hopefully re-assess over the next few visits. We were abe to integrate there-ex back into his program today. He tolerated well.     OBJECTIVE IMPAIRMENTS: decreased activity tolerance, decreased balance, decreased endurance, difficulty walking, decreased strength, and dizziness.   ACTIVITY LIMITATIONS: carrying, lifting, bending, standing, squatting, sleeping, and stairs  PARTICIPATION LIMITATIONS: meal prep, cleaning, laundry, driving, shopping, community activity, and yard work  PERSONAL FACTORS: 3+ comorbidities: type 2 diabetes, orthostatic hypotension, sleep apnea, thoracic aortic anuerysm  are also affecting patient's functional outcome.   REHAB POTENTIAL: Good  CLINICAL DECISION MAKING: Evolving/moderate complexity  EVALUATION COMPLEXITY: Moderate   GOALS: Goals reviewed with patient? Yes  SHORT TERM GOALS: Target date: 5/9 Patient will increase bilateral strength by 5lbs. Baseline: Goal status: Significant improvement in strength overall initial goal which she achieved 7/23 new goal 5 pounds from measurements on 7/23  2.  Patient will be independent with HEP. Baseline:  Goal status: Performing base HEP at home 6/12  3.  Patient will show minimal fluctuation and syncope with sit to stand transfer  Baseline:  Goal status: Minimal syncope.  Continues to have fluctuations in blood pressure  4. Patient will demonstrate full lumbar flexion and rotation without pain  Goal status : new   5. Patient will increase gross bilateral LE strength to 5/5   LONG TERM GOALS: Target date: 01/27/2023    Patient will transfer sit to stand without loss of balance or syncope  Baseline:  Goal status: MET 6/4  2.  Patient will perform daily activity with no loss of balance  Baseline:  Goal status: IN PROGRESS 6/12 improving but still some baseline balance issues goal continues to improve  7/23  3.  Patient will be able to walk community distances without any discomfort in left hip or feeling of lost balance .  Baseline:  Goal status: Mild pain in his hip but improving ability to ambulate in the community 7/23      PLAN:  PT FREQUENCY: 1-2x/week  PT DURATION: 10 weeks  PLANNED INTERVENTIONS: Therapeutic exercises, Therapeutic activity, Neuromuscular re-education, Balance training, Gait training, Patient/Family education, Self Care, Joint mobilization, Stair training, Aquatic Therapy, Electrical stimulation, Cryotherapy, Moist heat, Taping, Ultrasound, Ionotophoresis 4mg /ml Dexamethasone, and Manual therapy  PLAN FOR NEXT SESSION: Consider TUG, 5 STS test, 6 minute walk, screen balance; adjust goals review HEP   Riki Altes, PTA   06/06/23

## 2023-06-14 ENCOUNTER — Ambulatory Visit (HOSPITAL_BASED_OUTPATIENT_CLINIC_OR_DEPARTMENT_OTHER): Payer: Medicare HMO

## 2023-06-14 ENCOUNTER — Encounter (HOSPITAL_BASED_OUTPATIENT_CLINIC_OR_DEPARTMENT_OTHER): Payer: Self-pay

## 2023-06-14 DIAGNOSIS — M5459 Other low back pain: Secondary | ICD-10-CM | POA: Diagnosis not present

## 2023-06-14 DIAGNOSIS — R42 Dizziness and giddiness: Secondary | ICD-10-CM | POA: Diagnosis not present

## 2023-06-14 DIAGNOSIS — R2681 Unsteadiness on feet: Secondary | ICD-10-CM | POA: Diagnosis not present

## 2023-06-14 DIAGNOSIS — R262 Difficulty in walking, not elsewhere classified: Secondary | ICD-10-CM | POA: Diagnosis not present

## 2023-06-14 DIAGNOSIS — M62830 Muscle spasm of back: Secondary | ICD-10-CM

## 2023-06-14 NOTE — Therapy (Signed)
OUTPATIENT PHYSICAL THERAPY LOWER EXTREMITY TREATMENT   Patient Name: Jason [name] MRN: [id number] DOB:[birth year], 87 y.o., male Today's Date: 06/14/2023  END OF SESSION:  PT End of Session - 06/14/23 1224     Visit Number 31    Number of Visits 42    Date for PT Re-Evaluation 07/05/23    Authorization Type progress note at 34.    PT Start Time 1101    PT Stop Time 1145    PT Time Calculation (min) 44 min    Activity Tolerance Patient tolerated treatment well    Behavior During Therapy WFL for tasks assessed/performed                       Past Medical History:  Diagnosis Date   Anxiety and depression    Ascending aortic aneurysm (HCC) 01/22/2022   CAD in native artery 01/22/2022   Chronic renal insufficiency, stage 3 (moderate) (HCC)    Diabetes mellitus without complication (HCC)    Gait instability 01/22/2022   GERD (gastroesophageal reflux disease)    Gout    Hypercholesterolemia    Hypertension    Lumbar compression fracture (HCC)    L2, 20%, chronic (imaging 2024).  DEXA -1.0 04/2023   Orthostatic hypotension    OSA (obstructive sleep apnea)    Osteoarthritis of left hip    + troch burs   Prostate cancer (HCC)    1990-->prostatectomy, released from urol x many years   Urinary incontinence    Past Surgical History:  Procedure Laterality Date   CHOLECYSTECTOMY, LAPAROSCOPIC     1992   DEXA     T score -1.0 Sept 2024   ROBOT ASSISTED LAPAROSCOPIC RADICAL PROSTATECTOMY     1999   ROTATOR CUFF REPAIR     Left 1991, right 1997   TOTAL KNEE ARTHROPLASTY     bilat 2004   TRANSTHORACIC ECHOCARDIOGRAM     11/24/22, EF nl, +LVH with no WMA, grd I DD, valves ok (no amyloid changes)   Patient Active Problem List   Diagnosis Date Noted   Type 2 diabetes mellitus with obesity (HCC) 06/03/2023   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   Obstructive sleep apnea of adult 08/11/2022   Diabetic nephropathy with proteinuria (HCC) 08/11/2022   Obesity 08/11/2022    Hyperlipidemia 08/11/2022   Stage 3a chronic kidney disease (HCC) 08/11/2022   Anxiety disorder, unspecified 08/11/2022   Diabetes mellitus (HCC) 08/11/2022   Primary malignant neoplasm of prostate (HCC) 08/02/2022   Carcinoma of prostate (HCC) 08/02/2022   Shortness of breath 02/22/2022   Pure hypercholesterolemia 02/22/2022   Pain in joint, shoulder region 02/22/2022   Other malaise and fatigue 02/22/2022   Nonexudative age-related macular degeneration, bilateral, intermediate dry stage 02/22/2022   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   Gastroesophageal reflux disease 02/22/2022   Benign paroxysmal positional vertigo 02/22/2022   CAD in native artery 01/22/2022   Thoracic aortic aneurysm, without rupture, unspecified (HCC) 01/22/2022   Gait instability 01/22/2022   Essential hypertension 01/17/2022   Type 2 diabetes mellitus without complications (HCC) 01/17/2022   HLD (hyperlipidemia) 01/17/2022   Obstructive sleep apnea 01/17/2022   Sensorineural hearing loss, bilateral 11/09/2021   Orthostatic hypotension 11/09/2021   Imbalance 11/09/2021   Overweight 02/21/2020   First degree heart block 02/21/2020   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   Malignant tumor of prostate (HCC) 02/21/2020   Vitamin D deficiency 05/31/2016   History of malignant neoplasm of prostate 05/31/2016   History of malignant neoplasm  of skin 05/31/2016   Bilateral hearing loss 05/31/2016   Anxiety state 05/31/2016   Bronchospasm 09/24/2010   Cough 09/19/2010   Acute bronchitis 09/19/2010   Allergic rhinitis due to pollen 09/19/2010   Acute sinusitis 09/19/2010    PCP: Meredith Staggers, MD  REFERRING PROVIDER: Meredith Staggers, MD  REFERRING DIAG: R26.81 (ICD-10-CM) - Unsteadiness   THERAPY DIAG:  Difficulty in walking, not elsewhere classified  Dizziness and giddiness  Unsteadiness on feet  Muscle spasm of back  Other low back pain  Rationale for Evaluation and Treatment: Rehabilitation  ONSET DATE:  march 2024  SUBJECTIVE:   SUBJECTIVE STATEMENT: Pt reports he feels pretty good today. Only very mild light headedness today. Pt reports his appetite has decreased since taking Monjaro. Has been trying to eat enough. "I don't think enough water."  PERTINENT HISTORY: Type 2 diabetes, sleep apnea, thoracic aortic aneurysm, orthorstatic hypotension  PAIN:  Are you having pain? No  PRECAUTIONS: None  WEIGHT BEARING RESTRICTIONS: No  FALLS:  Has patient fallen in last 6 months? Yes. Number of falls 2, step done on a small step. Another one reaching down and fell  LIVING ENVIRONMENT: Lives with: lives with their family Lives in: House/apartment Stairs: No Has following equipment at home: Single point cane  OCCUPATION: retired   PLOF: Independent  PATIENT GOALS: walking without pain, being more stable  NEXT MD VISIT: nothing scheduled   OBJECTIVE:      DIAGNOSTIC FINDINGS: nothing pertinent   PATIENT SURVEYS:  FOTO    COGNITION: Overall cognitive status: Within functional limits for tasks assessed     SENSATION: WFL  EDEMA:   MUSCLE LENGTH:   POSTURE: rounded shoulders  PALPATION:   LOWER EXTREMITY ROM:  Active ROM Right eval Left eval  Hip flexion    Hip extension    Hip abduction    Hip adduction    Hip internal rotation    Hip external rotation    Knee flexion    Knee extension    Ankle dorsiflexion    Ankle plantarflexion    Ankle inversion    Ankle eversion     (Blank rows = not tested)  LOWER EXTREMITY MMT:  MMT Right eval Left eval Right 6/11  Left  6/11 Right 7/23 Left 7/23 Right  9/24 Left  9/24  Hip flexion 19.5 19.3 painful 25.0 23.9 32.4 26.3 14.8 26.3  Hip extension          Hip abduction 22.5 27.1 42.1 41.2 46. 33.5 32.9 28.8  Hip adduction          Hip internal rotation          Hip external rotation          Knee flexion          Knee extension 27.4 33.2 45.1 42.5 45.4 52.3 30.8 32.2   Ankle dorsiflexion           Ankle plantarflexion          Ankle inversion          Ankle eversion           (Blank rows = not tested)  LOWER EXTREMITY SPECIAL TESTS:    EVAL: Vitals:  Patient Initial blood pressure seated was 165/118.  Patient blood pressure was retaken after a few minutes and was 171/103. Patient blood pressure was also taken standing, 134/78 FUNCTIONAL TESTS:  Eval:TUG test: 16sec- No AD 5xSTS: 27sec (used arms to push up)  6/11  5XSTS  19  TUG 17 without AD   6 min walk test: 2.5 minutes until for seated rest break seated rest break of 2 minutes.  After rest break able to ambulate until 5:30 mark at which time he needed a seated rest break for the rest of the test.  Total distance 527 feet.  7/23 5x STS 13  6 min walk test 875 no rest break  After walk test 185/105   9/26  6 min walk test-7103ft with cane 5xSTS- 16.10     GAIT: Patients uses a SPC to ambulate.  Balance:   6/11 Narrow base of support standby assist Narrow base of support eyes closed min assist Tandem stance min assist bilateral Single-leg stance unable to maintain  7/23 Narrow base of support standby assist Narrow base of support eyes closed min assist Tandem stance min assist bilateral Single-leg stance unable to maintain  9/24 7/23 Narrow base of support standby assist Narrow base of support eyes closed min assist Tandem stance min assist bilateral Single-leg stance unable to maintain  No change from previous visit   TODAY'S TREATMENT:                                                                                                                              DATE:   10/29 Seated: 114/78 Standing: 88/64 (asymptomatic)  Nu-step L5 UE/LE Standing: 67/52 Standing: 97/69 Seated: 134/85  Punches 2 lbs 3x15  Biceps curls 2lbs 3x15  Seated march 2x15 2.5# weights Seated LAQ 3x10 2.5#weights  Standing: 91/68 end of session    10/24 Seated: 113/80 Standing: 86/69  Later in  session  Seated 126/80 Standing: 86/89  Punches 2 lbs 3x15  Biceps curls 2lbs 3x15  Hip abduction 3x15  RTB Seated march 2x15 RTB Seated LAQ 3x10 2.5#weights  Nu-step 5 min   06/06/23: Seated: 127/83 Standing: 81/64 Nu-step L4 Hip abduction 3x15  RTB Seated march 2x15 RTB Seated LAQ 3x10 2.5#weights  Seated: 120/84 Standing:123/110   1014/ Seated 143/93  Standing 90/64  Hip abduction 3x15  Seated march 2x15  Seated LAQ 3x10  All with a red band   Bilateral er 3x15 red  Horizontal abduction 3x15 red  Wand flexion 2x15       9/26:  Beginning of session 140/90 seated BP 115/75 standing BP Seated lumbar flexion stretch with ball on plinth 10sec x5 [id number] with cane BP seated 158/99 5xSTS 16.1 sec Supine LTR x10 5sec hold Supine SKTC 20sec x2ea Supine clam GTB x20   9/24 Assessed patient low back  Manual: trigger point release to bilateral hips and lower back  LAD bilateral but patient was guarded so minimal LAD provided   LAQ 2x10 red  Hip abduction 2x10 red  Seated march red 2x10   Mild dyspnea noted with there-ex     9/19  Manual: trigger point release to bilateral hips and lower back  LAD bilateral but patient was guarded so minimal LAD  provided    CLINICAL IMPRESSION:  Held standing exercises today due to significant drop in BP in standing after nu-step. He denied severe symptoms of light headedness or dizziness, but did report slight increase of these after nu step. Monitored  BP throughout seated exercises. Pt will try to see PCP for f/u next week to discuss.   RE-EVAL: herapy performed assessment of patient back. He has increased pain with lumbar flexion and right rotation. He continues to have a significant spasm in his left gluteal. His right knee is sore at this time. Since the onset of his back pain his muscle strength has decreased. He has more weakness on the right side today. We were able to re-start LE exercises today  with minimal pain. We continue to work on manual therapy to the low back. We will continue to work on balance, endurance, and low back pain  2W8. We did not assess 5x sit to stand or 6 min walk today 2nd to lumbar spine. We will hopefully re-assess over the next few visits. We were abe to integrate there-ex back into his program today. He tolerated well.     OBJECTIVE IMPAIRMENTS: decreased activity tolerance, decreased balance, decreased endurance, difficulty walking, decreased strength, and dizziness.   ACTIVITY LIMITATIONS: carrying, lifting, bending, standing, squatting, sleeping, and stairs  PARTICIPATION LIMITATIONS: meal prep, cleaning, laundry, driving, shopping, community activity, and yard work  PERSONAL FACTORS: 3+ comorbidities: type 2 diabetes, orthostatic hypotension, sleep apnea, thoracic aortic anuerysm  are also affecting patient's functional outcome.   REHAB POTENTIAL: Good  CLINICAL DECISION MAKING: Evolving/moderate complexity  EVALUATION COMPLEXITY: Moderate   GOALS: Goals reviewed with patient? Yes  SHORT TERM GOALS: Target date: 5/9 Patient will increase bilateral strength by 5lbs. Baseline: Goal status: Significant improvement in strength overall initial goal which she achieved 7/23 new goal 5 pounds from measurements on 7/23  2.  Patient will be independent with HEP. Baseline:  Goal status: Performing base HEP at home 6/12  3.  Patient will show minimal fluctuation and syncope with sit to stand transfer  Baseline:  Goal status: Minimal syncope.  Continues to have fluctuations in blood pressure  4. Patient will demonstrate full lumbar flexion and rotation without pain  Goal status : new   5. Patient will increase gross bilateral LE strength to 5/5   LONG TERM GOALS: Target date: 01/27/2023    Patient will transfer sit to stand without loss of balance or syncope  Baseline:  Goal status: MET 6/4  2.  Patient will perform daily activity with no loss  of balance  Baseline:  Goal status: IN PROGRESS 6/12 improving but still some baseline balance issues goal continues to improve 7/23  3.  Patient will be able to walk community distances without any discomfort in left hip or feeling of lost balance .  Baseline:  Goal status: Mild pain in his hip but improving ability to ambulate in the community 7/23      PLAN:  PT FREQUENCY: 1-2x/week  PT DURATION: 10 weeks  PLANNED INTERVENTIONS: Therapeutic exercises, Therapeutic activity, Neuromuscular re-education, Balance training, Gait training, Patient/Family education, Self Care, Joint mobilization, Stair training, Aquatic Therapy, Electrical stimulation, Cryotherapy, Moist heat, Taping, Ultrasound, Ionotophoresis 4mg /ml Dexamethasone, and Manual therapy  PLAN FOR NEXT SESSION: Consider TUG, 5 STS test, 6 minute walk, screen balance; adjust goals review HEP   Riki Altes, PTA   06/14/23

## 2023-06-16 ENCOUNTER — Ambulatory Visit (HOSPITAL_BASED_OUTPATIENT_CLINIC_OR_DEPARTMENT_OTHER): Payer: Medicare HMO

## 2023-06-16 ENCOUNTER — Encounter (HOSPITAL_BASED_OUTPATIENT_CLINIC_OR_DEPARTMENT_OTHER): Payer: Self-pay

## 2023-06-20 ENCOUNTER — Encounter (HOSPITAL_BASED_OUTPATIENT_CLINIC_OR_DEPARTMENT_OTHER): Payer: Self-pay | Admitting: Physical Therapy

## 2023-06-20 ENCOUNTER — Ambulatory Visit (HOSPITAL_BASED_OUTPATIENT_CLINIC_OR_DEPARTMENT_OTHER): Payer: Medicare HMO | Attending: Family Medicine | Admitting: Physical Therapy

## 2023-06-20 DIAGNOSIS — M62830 Muscle spasm of back: Secondary | ICD-10-CM | POA: Diagnosis not present

## 2023-06-20 DIAGNOSIS — M5459 Other low back pain: Secondary | ICD-10-CM | POA: Insufficient documentation

## 2023-06-20 DIAGNOSIS — R2681 Unsteadiness on feet: Secondary | ICD-10-CM | POA: Diagnosis not present

## 2023-06-20 DIAGNOSIS — R42 Dizziness and giddiness: Secondary | ICD-10-CM | POA: Diagnosis not present

## 2023-06-20 DIAGNOSIS — R262 Difficulty in walking, not elsewhere classified: Secondary | ICD-10-CM | POA: Diagnosis not present

## 2023-06-20 NOTE — Therapy (Unsigned)
OUTPATIENT PHYSICAL THERAPY LOWER EXTREMITY TREATMENT   Patient Name: Danilo Cappiello MRN: [id number] DOB:12/08/1935, 87 y.o., male Today's Date: 06/20/2023  END OF SESSION:  PT End of Session - 06/20/23 1107     Visit Number 32    Number of Visits 42    Date for PT Re-Evaluation 07/05/23    PT Start Time 1100    PT Stop Time 1143    PT Time Calculation (min) 43 min    Activity Tolerance Patient tolerated treatment well    Behavior During Therapy [facility] for tasks assessed/performed                       Past Medical History:  Diagnosis Date   Anxiety and depression    Ascending aortic aneurysm (HCC) 01/22/2022   CAD in native artery 01/22/2022   Chronic renal insufficiency, stage 3 (moderate) (HCC)    Diabetes mellitus without complication (HCC)    Gait instability 01/22/2022   GERD (gastroesophageal reflux disease)    Gout    Hypercholesterolemia    Hypertension    Lumbar compression fracture (HCC)    L2, 20%, chronic (imaging 2024).  DEXA -1.0 04/2023   Orthostatic hypotension    OSA (obstructive sleep apnea)    Osteoarthritis of left hip    + troch burs   Prostate cancer (HCC)    1990-->prostatectomy, released from urol x many years   Urinary incontinence    Past Surgical History:  Procedure Laterality Date   CHOLECYSTECTOMY, LAPAROSCOPIC     1992   DEXA     T score -1.0 Sept 2024   ROBOT ASSISTED LAPAROSCOPIC RADICAL PROSTATECTOMY     1999   ROTATOR CUFF REPAIR     Left 1991, right 1997   TOTAL KNEE ARTHROPLASTY     bilat 2004   TRANSTHORACIC ECHOCARDIOGRAM     11/24/22, EF nl, +LVH with no WMA, grd I DD, valves ok (no amyloid changes)   Patient Active Problem List   Diagnosis Date Noted   Type 2 diabetes mellitus with obesity (HCC) 06/03/2023   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   Obstructive sleep apnea of adult 08/11/2022   Diabetic nephropathy with proteinuria (HCC) 08/11/2022   Obesity 08/11/2022   Hyperlipidemia 08/11/2022   Stage 3a chronic  kidney disease (HCC) 08/11/2022   Anxiety disorder, unspecified 08/11/2022   Diabetes mellitus (HCC) 08/11/2022   Primary malignant neoplasm of prostate (HCC) 08/02/2022   Carcinoma of prostate (HCC) 08/02/2022   Shortness of breath 02/22/2022   Pure hypercholesterolemia 02/22/2022   Pain in joint, shoulder region 02/22/2022   Other malaise and fatigue 02/22/2022   Nonexudative age-related macular degeneration, bilateral, intermediate dry stage 02/22/2022   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   Gastroesophageal reflux disease 02/22/2022   Benign paroxysmal positional vertigo 02/22/2022   CAD in native artery 01/22/2022   Thoracic aortic aneurysm, without rupture, unspecified (HCC) 01/22/2022   Gait instability 01/22/2022   Essential hypertension 01/17/2022   Type 2 diabetes mellitus without complications (HCC) 01/17/2022   HLD (hyperlipidemia) 01/17/2022   Obstructive sleep apnea 01/17/2022   Sensorineural hearing loss, bilateral 11/09/2021   Orthostatic hypotension 11/09/2021   Imbalance 11/09/2021   Overweight 02/21/2020   First degree heart block 02/21/2020   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   Malignant tumor of prostate (HCC) 02/21/2020   Vitamin D deficiency 05/31/2016   History of malignant neoplasm of prostate 05/31/2016   History of malignant neoplasm of skin 05/31/2016   Bilateral hearing loss 05/31/2016  Anxiety state 05/31/2016   Bronchospasm 09/24/2010   Cough 09/19/2010   Acute bronchitis 09/19/2010   Allergic rhinitis due to pollen 09/19/2010   Acute sinusitis 09/19/2010    PCP: Meredith Staggers, MD  REFERRING PROVIDER: Meredith Staggers, MD  REFERRING DIAG: R26.81 (ICD-10-CM) - Unsteadiness   THERAPY DIAG:  Difficulty in walking, not elsewhere classified  Dizziness and giddiness  Unsteadiness on feet  Rationale for Evaluation and Treatment: Rehabilitation  ONSET DATE: march 2024  SUBJECTIVE:   SUBJECTIVE STATEMENT: The patient comes in today with a  hedached feeling light headed. He does not feel like he slept very well last night  PERTINENT HISTORY: Type 2 diabetes, sleep apnea, thoracic aortic aneurysm, orthorstatic hypotension  PAIN:  Are you having pain? No  PRECAUTIONS: None  WEIGHT BEARING RESTRICTIONS: No  FALLS:  Has patient fallen in last 6 months? Yes. Number of falls 2, step done on a small step. Another one reaching down and fell  LIVING ENVIRONMENT: Lives with: lives with their family Lives in: House/apartment Stairs: No Has following equipment at home: Single point cane  OCCUPATION: retired   PLOF: Independent  PATIENT GOALS: walking without pain, being more stable  NEXT MD VISIT: nothing scheduled   OBJECTIVE:      DIAGNOSTIC FINDINGS: nothing pertinent   PATIENT SURVEYS:  FOTO    COGNITION: Overall cognitive status: Within functional limits for tasks assessed     SENSATION: WFL  EDEMA:   MUSCLE LENGTH:   POSTURE: rounded shoulders  PALPATION:   LOWER EXTREMITY ROM:  Active ROM Right eval Left eval  Hip flexion    Hip extension    Hip abduction    Hip adduction    Hip internal rotation    Hip external rotation    Knee flexion    Knee extension    Ankle dorsiflexion    Ankle plantarflexion    Ankle inversion    Ankle eversion     (Blank rows = not tested)  LOWER EXTREMITY MMT:  MMT Right eval Left eval Right 6/11  Left  6/11 Right 7/23 Left 7/23 Right  9/24 Left  9/24  Hip flexion 19.5 19.3 painful 25.0 23.9 32.4 26.3 14.8 26.3  Hip extension          Hip abduction 22.5 27.1 42.1 41.2 46. 33.5 32.9 28.8  Hip adduction          Hip internal rotation          Hip external rotation          Knee flexion          Knee extension 27.4 33.2 45.1 42.5 45.4 52.3 30.8 32.2   Ankle dorsiflexion          Ankle plantarflexion          Ankle inversion          Ankle eversion           (Blank rows = not tested)  LOWER EXTREMITY SPECIAL TESTS:    EVAL: Vitals:   Patient Initial blood pressure seated was 165/118.  Patient blood pressure was retaken after a few minutes and was 171/103. Patient blood pressure was also taken standing, 134/78 FUNCTIONAL TESTS:  Eval:TUG test: 16sec- No AD 5xSTS: 27sec (used arms to push up)  6/11  5XSTS 19  TUG 17 without AD   6 min walk test: 2.5 minutes until for seated rest break seated rest break of 2 minutes.  After rest break able to ambulate  until 5:30 mark at which time he needed a seated rest break for the rest of the test.  Total distance 527 feet.  7/23 5x STS 13  6 min walk test 875 no rest break  After walk test 185/105   9/26  6 min walk test-774ft with cane 5xSTS- 16.10     GAIT: Patients uses a SPC to ambulate.  Balance:   6/11 Narrow base of support standby assist Narrow base of support eyes closed min assist Tandem stance min assist bilateral Single-leg stance unable to maintain  7/23 Narrow base of support standby assist Narrow base of support eyes closed min assist Tandem stance min assist bilateral Single-leg stance unable to maintain  9/24 7/23 Narrow base of support standby assist Narrow base of support eyes closed min assist Tandem stance min assist bilateral Single-leg stance unable to maintain  No change from previous visit   TODAY'S TREATMENT:                                                                                                                              DATE:  11/4 Trigger point release to right upper trap; review of self trigger point release and upper trap stretch   B/P   Sitting: 183/100  And 190 /103   No exercises performed    Reviewed B/P  10/29 Seated: 114/78 Standing: 88/64 (asymptomatic)  Nu-step L5 UE/LE Standing: 67/52 Standing: 97/69 Seated: 134/85  Punches 2 lbs 3x15  Biceps curls 2lbs 3x15  Seated march 2x15 2.5# weights Seated LAQ 3x10 2.5#weights  Standing: 91/68 end of session    10/24 Seated:  113/80 Standing: 86/69  Later in session  Seated 126/80 Standing: 86/89  Punches 2 lbs 3x15  Biceps curls 2lbs 3x15  Hip abduction 3x15  RTB Seated march 2x15 RTB Seated LAQ 3x10 2.5#weights  Nu-step 5 min   06/06/23: Seated: 127/83 Standing: 81/64 Nu-step L4 Hip abduction 3x15  RTB Seated march 2x15 RTB Seated LAQ 3x10 2.5#weights  Seated: 120/84 Standing:123/110   1014/ Seated 143/93  Standing 90/64  Hip abduction 3x15  Seated march 2x15  Seated LAQ 3x10  All with a red band   Bilateral er 3x15 red  Horizontal abduction 3x15 red  Wand flexion 2x15       9/26:  Beginning of session 140/90 seated BP 115/75 standing BP Seated lumbar flexion stretch with ball on plinth 10sec x5 6MWT-773ft with cane BP seated 158/99 5xSTS 16.1 sec Supine LTR x10 5sec hold Supine SKTC 20sec x2ea Supine clam GTB x20   9/24 Assessed patient low back  Manual: trigger point release to bilateral hips and lower back  LAD bilateral but patient was guarded so minimal LAD provided   LAQ 2x10 red  Hip abduction 2x10 red  Seated march red 2x10   Mild dyspnea noted with there-ex     9/19  Manual: trigger point release to bilateral hips and lower back  LAD bilateral  but patient was guarded so minimal LAD provided    CLINICAL IMPRESSION:  The patient was limited by B/P today. His dystolic B/P was over treatment range. His B/P usually runs low as well. He was advised to go home , rest, and take it again. He was advised if his dystolic number remained over 409 to call his MD right away. He had tightness in his upper trap today. Therapy performed manual therapy to the upper trap. He had improved tightness and pain following. Therapy will continue to progress as tolerated.   RE-EVAL: herapy performed assessment of patient back. He has increased pain with lumbar flexion and right rotation. He continues to have a significant spasm in his left gluteal. His right knee is  sore at this time. Since the onset of his back pain his muscle strength has decreased. He has more weakness on the right side today. We were able to re-start LE exercises today with minimal pain. We continue to work on manual therapy to the low back. We will continue to work on balance, endurance, and low back pain  2W8. We did not assess 5x sit to stand or 6 min walk today 2nd to lumbar spine. We will hopefully re-assess over the next few visits. We were abe to integrate there-ex back into his program today. He tolerated well.     OBJECTIVE IMPAIRMENTS: decreased activity tolerance, decreased balance, decreased endurance, difficulty walking, decreased strength, and dizziness.   ACTIVITY LIMITATIONS: carrying, lifting, bending, standing, squatting, sleeping, and stairs  PARTICIPATION LIMITATIONS: meal prep, cleaning, laundry, driving, shopping, community activity, and yard work  PERSONAL FACTORS: 3+ comorbidities: type 2 diabetes, orthostatic hypotension, sleep apnea, thoracic aortic anuerysm  are also affecting patient's functional outcome.   REHAB POTENTIAL: Good  CLINICAL DECISION MAKING: Evolving/moderate complexity  EVALUATION COMPLEXITY: Moderate   GOALS: Goals reviewed with patient? Yes  SHORT TERM GOALS: Target date: 5/9 Patient will increase bilateral strength by 5lbs. Baseline: Goal status: Significant improvement in strength overall initial goal which she achieved 7/23 new goal 5 pounds from measurements on 7/23  2.  Patient will be independent with HEP. Baseline:  Goal status: Performing base HEP at home 6/12  3.  Patient will show minimal fluctuation and syncope with sit to stand transfer  Baseline:  Goal status: Minimal syncope.  Continues to have fluctuations in blood pressure  4. Patient will demonstrate full lumbar flexion and rotation without pain  Goal status : new   5. Patient will increase gross bilateral LE strength to 5/5   LONG TERM GOALS: Target date:  01/27/2023    Patient will transfer sit to stand without loss of balance or syncope  Baseline:  Goal status: MET 6/4  2.  Patient will perform daily activity with no loss of balance  Baseline:  Goal status: IN PROGRESS 6/12 improving but still some baseline balance issues goal continues to improve 7/23  3.  Patient will be able to walk community distances without any discomfort in left hip or feeling of lost balance .  Baseline:  Goal status: Mild pain in his hip but improving ability to ambulate in the community 7/23      PLAN:  PT FREQUENCY: 1-2x/week  PT DURATION: 10 weeks  PLANNED INTERVENTIONS: Therapeutic exercises, Therapeutic activity, Neuromuscular re-education, Balance training, Gait training, Patient/Family education, Self Care, Joint mobilization, Stair training, Aquatic Therapy, Electrical stimulation, Cryotherapy, Moist heat, Taping, Ultrasound, Ionotophoresis 4mg /ml Dexamethasone, and Manual therapy  PLAN FOR NEXT SESSION: Consider TUG, 5 STS test,  6 minute walk, screen balance; adjust goals review HEP   Riki Altes, PTA   06/14/23

## 2023-06-21 ENCOUNTER — Encounter (HOSPITAL_BASED_OUTPATIENT_CLINIC_OR_DEPARTMENT_OTHER): Payer: Self-pay | Admitting: Physical Therapy

## 2023-06-23 ENCOUNTER — Ambulatory Visit (HOSPITAL_BASED_OUTPATIENT_CLINIC_OR_DEPARTMENT_OTHER): Payer: Medicare HMO | Admitting: Physical Therapy

## 2023-06-23 ENCOUNTER — Encounter (HOSPITAL_BASED_OUTPATIENT_CLINIC_OR_DEPARTMENT_OTHER): Payer: Self-pay

## 2023-06-24 ENCOUNTER — Encounter: Payer: Self-pay | Admitting: Family Medicine

## 2023-06-24 ENCOUNTER — Ambulatory Visit (INDEPENDENT_AMBULATORY_CARE_PROVIDER_SITE_OTHER): Payer: Medicare HMO | Admitting: Family Medicine

## 2023-06-24 VITALS — BP 114/75 | HR 78 | Wt 259.0 lb

## 2023-06-24 DIAGNOSIS — I10 Essential (primary) hypertension: Secondary | ICD-10-CM | POA: Diagnosis not present

## 2023-06-24 DIAGNOSIS — Z7689 Persons encountering health services in other specified circumstances: Secondary | ICD-10-CM

## 2023-06-24 DIAGNOSIS — I951 Orthostatic hypotension: Secondary | ICD-10-CM | POA: Diagnosis not present

## 2023-06-24 MED ORDER — HYDRALAZINE HCL 25 MG PO TABS: ORAL_TABLET | ORAL | 0 refills | Status: DC

## 2023-06-24 MED ORDER — TIRZEPATIDE 5 MG/0.5ML ~~LOC~~ SOAJ: 5.0000 mg | SUBCUTANEOUS | 3 refills | Status: DC

## 2023-06-24 NOTE — Patient Instructions (Signed)
Check your blood pressure once in the morning and once in the evening.  If the top number is greater than 170 or if the bottom number is greater than 100, wait 5 minutes and check the blood pressure again. If blood pressure remains over these numbers then take hydralazine 25 mg tab at that time.

## 2023-06-24 NOTE — Progress Notes (Signed)
OFFICE VISIT  06/24/2023  CC:  Chief Complaint  Patient presents with   Weight Check    Patient is a 87 y.o. male who presents for follow-up weight management in the setting of diabetes.  I last saw him about 3 weeks ago. DM: Currently on Glimepiride 1mg  daily, Metformin 1000mg  daily, Pioglitazone 15mg  daily, and Mounjaro 5mg  daily.  A1c on 9/20 improved to 7.0%.  INTERIM HX: Jason [name] was initially set to have a routine weight management follow-up in 1 week but came in early because he has been having elevated blood pressures over the last 1 week.  At physical therapy they noted that his blood pressure was in the 170 over 100 range when sitting.  When standing it typically went into the 140s systolic over 90s to 100 diastolic. Only on 1 occasion did go low--> 95/72. He began checking it at home during this time and was getting similar numbers to what he got at physical therapy--> elevated. No headaches or visual abnormalities.  He would feel a slight sense of disequilibrium a couple of minutes after standing up but not every time.  It did not begin right upon standing and he did not feel presyncopal.  No sense of tachycardia or palpitations.  Sugars have been normal, no periods of hypoglycemia. He continues to take his Mounjaro 5 mg weekly and denies any adverse side effects.  Review of systems: No double vision, no slurred speech, no focal weakness, no chest pain, no shortness of breath, no palpitations.  Past Medical History:  Diagnosis Date   Anxiety and depression    Ascending aortic aneurysm (HCC) 01/22/2022   CAD in native artery 01/22/2022   Chronic renal insufficiency, stage 3 (moderate) (HCC)    Diabetes mellitus without complication (HCC)    Gait instability 01/22/2022   GERD (gastroesophageal reflux disease)    Gout    Hypercholesterolemia    Hypertension    Lumbar compression fracture (HCC)    L2, 20%, chronic (imaging 2024).  DEXA -1.0 04/2023   Orthostatic hypotension     OSA (obstructive sleep apnea)    Osteoarthritis of left hip    + troch burs   Prostate cancer (HCC)    1990-->prostatectomy, released from urol x many years   Urinary incontinence     Past Surgical History:  Procedure Laterality Date   CHOLECYSTECTOMY, LAPAROSCOPIC     1992   DEXA     T score -1.0 Sept 2024   ROBOT ASSISTED LAPAROSCOPIC RADICAL PROSTATECTOMY     1999   ROTATOR CUFF REPAIR     Left 1991, right 1997   TOTAL KNEE ARTHROPLASTY     bilat 2004   TRANSTHORACIC ECHOCARDIOGRAM     11/24/22, EF nl, +LVH with no WMA, grd I DD, valves ok (no amyloid changes)    Outpatient Medications Prior to Visit  Medication Sig Dispense Refill   allopurinol (ZYLOPRIM) 300 MG tablet Take 1 tablet (300 mg total) by mouth daily. 90 tablet 1   aspirin EC 81 MG tablet Take 1 tablet every day by oral route.     atorvastatin (LIPITOR) 40 MG tablet Take 1 tablet (40 mg total) by mouth daily. 90 tablet 1   b complex vitamins capsule Take 1 capsule by mouth daily.     Continuous Glucose Receiver (FREESTYLE LIBRE 3 READER) DEVI Inject 1 each into the skin every 14 (fourteen) days. 2 each 2   Continuous Glucose Sensor (FREESTYLE LIBRE 3 SENSOR) MISC Inject 1 each into  the skin every 14 (fourteen) days. 2 each 2   fluticasone (FLONASE) 50 MCG/ACT nasal spray Place 1-2 sprays into both nostrils daily. 16 g 6   glimepiride (AMARYL) 1 MG tablet Take 1 tablet (1 mg total) by mouth daily with breakfast. 90 tablet 1   glucose blood test strip E11.9 Use to test blood sugar twice daily or as needed 100 each 12   ipratropium (ATROVENT) 0.03 % nasal spray PLACE 2 SPRAYS INTO BOTH NOSTRILS EVERY 12 (TWELVE) HOURS. USE 15 TO 30 MIN PRIOR TO BREAKFAST AND SUPPER 30 mL 6   Lancet Device MISC Dispense new lancet device based on patient insurance and current one touch delica products 1 each 0   losartan (COZAAR) 25 MG tablet Take 1 tablet (25 mg total) by mouth at bedtime. 90 tablet 2   metFORMIN (GLUCOPHAGE) 500  MG tablet TAKE 2 TABLETS (1,000 MG TOTAL) BY MOUTH 2 (TWO) TIMES DAILY WITH A MEAL. 360 tablet 0   metoprolol tartrate (LOPRESSOR) 25 MG tablet TAKE 1 TABLET BY MOUTH TWICE A DAY 180 tablet 3   midodrine (PROAMATINE) 10 MG tablet TAKE 1 TABLET BEFORE GETTING OUT OF BED OR SOON AFTER, 1 TABLET AT 12PM, 1 TABLET AROUND 4PM (Patient taking differently: TAKE 1 TABLET BEFORE GETTING OUT OF BED OR SOON AFTER, 1 TABLET AT 12PM.) 270 tablet 2   Multiple Vitamins-Minerals (ICAPS AREDS 2 PO) TAKE 1 CAPSULE BY MOUTH TWICE A DAY FOR MACULAR DEGENERATION     omeprazole (PRILOSEC) 20 MG capsule Take 1 capsule (20 mg total) by mouth daily. 90 capsule 1   OneTouch Delica Lancets 33G MISC E11.9 Use to test blood sugar twice daily or as needed 100 each 5   pioglitazone (ACTOS) 15 MG tablet Take 1 tablet (15 mg total) by mouth daily. 90 tablet 1   venlafaxine (EFFEXOR) 75 MG tablet Take 1 tablet (75 mg total) by mouth daily. 90 tablet 1   tirzepatide (MOUNJARO) 5 MG/0.5ML Pen Inject 5 mg into the skin once a week. 6 mL 0   No facility-administered medications prior to visit.    Allergies  Allergen Reactions   Lisinopril Cough, Swelling and Other (See Comments)    Review of Systems As per HPI  PE:    06/24/2023   10:14 AM 06/03/2023   11:16 AM 05/06/2023   10:31 AM  Vitals with BMI  Height   5\' 11"   Weight 259 lbs 262 lbs 269 lbs  BMI  36.56 37.53  Systolic 114 108 161  Diastolic 75 71 75  Pulse 78 81 72   Blood pressure and left arm sitting today was 124/62, heart rate 75.  Then about 1 minute after standing his blood pressure went to 130/60, heart rate unchanged. He says he felt a very mild sense of disequilibrium.  Physical Exam  Gen: Alert, well appearing.  Patient is oriented to person, place, time, and situation. AFFECT: pleasant, lucid thought and speech. CV: RRR, S1 and S2 distant, no m/r LUNGS: CTA bilat, nonlabored resps, good aeration in all lung fields.   LABS:  Last CBC Lab  Results  Component Value Date   WBC 7.9 05/06/2023   HGB 13.5 05/06/2023   HCT 42.0 05/06/2023   MCV 90.3 05/06/2023   MCH 29.0 05/06/2023   RDW 13.8 05/06/2023   PLT 276 05/06/2023   Last metabolic panel Lab Results  Component Value Date   GLUCOSE 207 (H) 05/06/2023   NA 138 05/06/2023   K 5.3 05/06/2023  CL 101 05/06/2023   CO2 26 05/06/2023   BUN 22 05/06/2023   CREATININE 1.43 (H) 05/06/2023   GFR 51.19 (L) 01/26/2023   CALCIUM 9.6 05/06/2023   PROT 6.9 05/06/2023   ALBUMIN 4.1 01/26/2023   BILITOT 0.5 05/06/2023   ALKPHOS 73 01/26/2023   AST 24 05/06/2023   ALT 25 05/06/2023   Last lipids Lab Results  Component Value Date   CHOL 112 05/06/2023   HDL 41 05/06/2023   LDLCALC 48 05/06/2023   TRIG 146 05/06/2023   CHOLHDL 2.7 05/06/2023   Last hemoglobin A1c Lab Results  Component Value Date   HGBA1C 7.0 (H) 05/06/2023   Last thyroid functions Lab Results  Component Value Date   TSH 3.410 04/12/2022   T4TOTAL 7.1 04/12/2022   IMPRESSION AND PLAN:  #1 elevated blood pressures.  Complicated blood pressure history -->he has a history of hypertension as well as orthostatic hypotension. He is currently on losartan 25 mg a day and Lopressor 25 mg twice a day in addition to midodrine 10 mg 3 times a day. Blood pressures normal here today both sitting and standing. No changes today but I will prescribe hydralazine 25 mg tab for him to take as needed blood pressure greater than 170 systolic or greater than 100 diastolic.  I wrote down these parameters for him today and he will continue to check blood pressure 1-2 times a day and bring these in for review in 7 to 10 days.  #2 weight management in the setting of type 2 diabetes. Doing well on Mounjaro 5 mg weekly.  An After Visit Summary was printed and given to the patient.  FOLLOW UP: Return for 7-10 days f/u HTN. Next CPE August/September 2025 Signed:  Santiago Bumpers, MD           06/24/2023

## 2023-06-28 ENCOUNTER — Ambulatory Visit (HOSPITAL_BASED_OUTPATIENT_CLINIC_OR_DEPARTMENT_OTHER): Payer: Medicare HMO

## 2023-06-30 ENCOUNTER — Encounter (HOSPITAL_BASED_OUTPATIENT_CLINIC_OR_DEPARTMENT_OTHER): Payer: Self-pay

## 2023-06-30 ENCOUNTER — Ambulatory Visit (HOSPITAL_BASED_OUTPATIENT_CLINIC_OR_DEPARTMENT_OTHER): Payer: Medicare HMO

## 2023-06-30 DIAGNOSIS — R42 Dizziness and giddiness: Secondary | ICD-10-CM | POA: Diagnosis not present

## 2023-06-30 DIAGNOSIS — R2681 Unsteadiness on feet: Secondary | ICD-10-CM

## 2023-06-30 DIAGNOSIS — R262 Difficulty in walking, not elsewhere classified: Secondary | ICD-10-CM | POA: Diagnosis not present

## 2023-06-30 DIAGNOSIS — M62830 Muscle spasm of back: Secondary | ICD-10-CM

## 2023-06-30 DIAGNOSIS — M5459 Other low back pain: Secondary | ICD-10-CM

## 2023-06-30 NOTE — Therapy (Addendum)
OUTPATIENT PHYSICAL THERAPY LOWER EXTREMITY TREATMENT PHYSICAL THERAPY DISCHARGE SUMMARY  Visits from Start of Care: 33  Current functional level related to goals / functional outcomes: Needs assistance for safety   Remaining deficits: pain   Education / Equipment: Management of condition/ HEP   Patient agrees to discharge. Patient goals were partially met. Patient is being discharged due to not returning since the last visit.  Addend Corrie Dandy Tomma Lightning) Ziemba MPT 10/06/23 1:04 PM [facility] [address] [place], [place], [address] Phone: [phone]   Fax:  [phone]    Patient Name: Jason [name] MRN: [id number] DOB:[birth year], 87 y.o., male Today's Date: 06/30/2023  END OF SESSION:  PT End of Session - 06/30/23 1152     Visit Number 33    Number of Visits 42    Date for PT Re-Evaluation 07/05/23    Authorization Type progress note at 34.    PT Start Time 1103    PT Stop Time 1145    PT Time Calculation (min) 42 min    Activity Tolerance Patient tolerated treatment well    Behavior During Therapy WFL for tasks assessed/performed                        Past Medical History:  Diagnosis Date   Anxiety and depression    Ascending aortic aneurysm (HCC) 01/22/2022   CAD in native artery 01/22/2022   Chronic renal insufficiency, stage 3 (moderate) (HCC)    Diabetes mellitus without complication (HCC)    Gait instability 01/22/2022   GERD (gastroesophageal reflux disease)    Gout    Hypercholesterolemia    Hypertension    Lumbar compression fracture (HCC)    L2, 20%, chronic (imaging 2024).  DEXA -1.0 04/2023   Orthostatic hypotension    OSA (obstructive sleep apnea)    Osteoarthritis of left hip    + troch burs   Prostate cancer (HCC)    1990-->prostatectomy, released from urol x many years   Urinary incontinence    Past Surgical History:  Procedure Laterality Date   CHOLECYSTECTOMY,  LAPAROSCOPIC     1992   DEXA     T score -1.0 Sept 2024   ROBOT ASSISTED LAPAROSCOPIC RADICAL PROSTATECTOMY     1999   ROTATOR CUFF REPAIR     Left 1991, right 1997   TOTAL KNEE ARTHROPLASTY     bilat 2004   TRANSTHORACIC ECHOCARDIOGRAM     11/24/22, EF nl, +LVH with no WMA, grd I DD, valves ok (no amyloid changes)   Patient Active Problem List   Diagnosis Date Noted   Type 2 diabetes mellitus with obesity (HCC) 06/03/2023   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   Obstructive sleep apnea of adult 08/11/2022   Diabetic nephropathy with proteinuria (HCC) 08/11/2022   Obesity 08/11/2022   Hyperlipidemia 08/11/2022   Stage 3a chronic kidney disease (HCC) 08/11/2022   Anxiety disorder, unspecified 08/11/2022   Diabetes mellitus (HCC) 08/11/2022   Primary malignant neoplasm of prostate (HCC) 08/02/2022   Carcinoma of prostate (HCC) 08/02/2022   Shortness of breath 02/22/2022   Pure hypercholesterolemia 02/22/2022   Pain in joint, shoulder region 02/22/2022   Other malaise and fatigue 02/22/2022   Nonexudative age-related macular degeneration, bilateral, intermediate dry stage 02/22/2022   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   Gastroesophageal reflux disease 02/22/2022   Benign paroxysmal positional vertigo 02/22/2022   CAD in native artery 01/22/2022   Thoracic aortic aneurysm, without rupture, unspecified (HCC) 01/22/2022  Gait instability 01/22/2022   Essential hypertension 01/17/2022   Type 2 diabetes mellitus without complications (HCC) 01/17/2022   HLD (hyperlipidemia) 01/17/2022   Obstructive sleep apnea 01/17/2022   Sensorineural hearing loss, bilateral 11/09/2021   Orthostatic hypotension 11/09/2021   Imbalance 11/09/2021   Overweight 02/21/2020   First degree heart block 02/21/2020   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   Malignant tumor of prostate (HCC) 02/21/2020   Vitamin D deficiency 05/31/2016   History of malignant neoplasm of prostate 05/31/2016   History of malignant  neoplasm of skin 05/31/2016   Bilateral hearing loss 05/31/2016   Anxiety state 05/31/2016   Bronchospasm 09/24/2010   Cough 09/19/2010   Acute bronchitis 09/19/2010   Allergic rhinitis due to pollen 09/19/2010   Acute sinusitis 09/19/2010    PCP: Meredith Staggers, MD  REFERRING PROVIDER: Meredith Staggers, MD  REFERRING DIAG: R26.81 (ICD-10-CM) - Unsteadiness   THERAPY DIAG:  Unsteadiness on feet  Muscle spasm of back  Difficulty in walking, not elsewhere classified  Dizziness and giddiness  Other low back pain  Rationale for Evaluation and Treatment: Rehabilitation  ONSET DATE: march 2024  SUBJECTIVE:   SUBJECTIVE STATEMENT: Pt reports his blood pressure has been "weird". He cancelled last visit due to his BP. "I almost fell when getting up from the table at dinner the other day." PERTINENT HISTORY: Type 2 diabetes, sleep apnea, thoracic aortic aneurysm, orthorstatic hypotension  PAIN:  Are you having pain? No  PRECAUTIONS: None  WEIGHT BEARING RESTRICTIONS: No  FALLS:  Has patient fallen in last 6 months? Yes. Number of falls 2, step done on a small step. Another one reaching down and fell  LIVING ENVIRONMENT: Lives with: lives with their family Lives in: House/apartment Stairs: No Has following equipment at home: Single point cane  OCCUPATION: retired   PLOF: Independent  PATIENT GOALS: walking without pain, being more stable  NEXT MD VISIT: nothing scheduled   OBJECTIVE:      DIAGNOSTIC FINDINGS: nothing pertinent   PATIENT SURVEYS:  FOTO    COGNITION: Overall cognitive status: Within functional limits for tasks assessed     SENSATION: WFL  EDEMA:   MUSCLE LENGTH:   POSTURE: rounded shoulders  PALPATION:   LOWER EXTREMITY ROM:  Active ROM Right eval Left eval  Hip flexion    Hip extension    Hip abduction    Hip adduction    Hip internal rotation    Hip external rotation    Knee flexion    Knee extension    Ankle  dorsiflexion    Ankle plantarflexion    Ankle inversion    Ankle eversion     (Blank rows = not tested)  LOWER EXTREMITY MMT:  MMT Right eval Left eval Right 6/11  Left  6/11 Right 7/23 Left 7/23 Right  9/24 Left  9/24  Hip flexion 19.5 19.3 painful 25.0 23.9 32.4 26.3 14.8 26.3  Hip extension          Hip abduction 22.5 27.1 42.1 41.2 46. 33.5 32.9 28.8  Hip adduction          Hip internal rotation          Hip external rotation          Knee flexion          Knee extension 27.4 33.2 45.1 42.5 45.4 52.3 30.8 32.2   Ankle dorsiflexion          Ankle plantarflexion  Ankle inversion          Ankle eversion           (Blank rows = not tested)  LOWER EXTREMITY SPECIAL TESTS:    EVAL: Vitals:  Patient Initial blood pressure seated was 165/118.  Patient blood pressure was retaken after a few minutes and was 171/103. Patient blood pressure was also taken standing, 134/78 FUNCTIONAL TESTS:  Eval:TUG test: 16sec- No AD 5xSTS: 27sec (used arms to push up)  6/11  5XSTS 19  TUG 17 without AD   6 min walk test: 2.5 minutes until for seated rest break seated rest break of 2 minutes.  After rest break able to ambulate until 5:30 mark at which time he needed a seated rest break for the rest of the test.  Total distance 527 feet.  7/23 5x STS 13  6 min walk test 875 no rest break  After walk test 185/105   9/26  6 min walk test-758ft with cane 5xSTS- 16.10     GAIT: Patients uses a SPC to ambulate.  Balance:   6/11 Narrow base of support standby assist Narrow base of support eyes closed min assist Tandem stance min assist bilateral Single-leg stance unable to maintain  7/23 Narrow base of support standby assist Narrow base of support eyes closed min assist Tandem stance min assist bilateral Single-leg stance unable to maintain  9/24 7/23 Narrow base of support standby assist Narrow base of support eyes closed min assist Tandem stance min  assist bilateral Single-leg stance unable to maintain  No change from previous visit   TODAY'S TREATMENT:                                                                                                                              DATE:   11/14:  130/87 seated 119/80 standing Nu-step L4 119/82 seated 96/65 Standing Waited a minute, took again 81/66 standing Seated marching 2# weight 2x20 LAQ 3# 2x10ea 67/55 standing Waited a few minutes took again 86/69 standing 127/86 seated  11/4 Trigger point release to right upper trap; review of self trigger point release and upper trap stretch   B/P   Sitting: 183/100  And 190 /103   No exercises performed    Reviewed B/P  10/29 Seated: 114/78 Standing: 88/64 (asymptomatic)  Nu-step L5 UE/LE Standing: 67/52 Standing: 97/69 Seated: 134/85  Punches 2 lbs 3x15  Biceps curls 2lbs 3x15  Seated march 2x15 2.5# weights Seated LAQ 3x10 2.5#weights  Standing: 91/68 end of session    10/24 Seated: 113/80 Standing: 86/69  Later in session  Seated 126/80 Standing: 86/89  Punches 2 lbs 3x15  Biceps curls 2lbs 3x15  Hip abduction 3x15  RTB Seated march 2x15 RTB Seated LAQ 3x10 2.5#weights  Nu-step 5 min   06/06/23: Seated: 127/83 Standing: 81/64 Nu-step L4 Hip abduction 3x15  RTB Seated march 2x15 RTB Seated LAQ 3x10 2.5#weights  Seated: 120/84 Standing:123/110  1014/ Seated 143/93  Standing 90/64  Hip abduction 3x15  Seated march 2x15  Seated LAQ 3x10  All with a red band   Bilateral er 3x15 red  Horizontal abduction 3x15 red  Wand flexion 2x15       9/26:  Beginning of session 140/90 seated BP 115/75 standing BP Seated lumbar flexion stretch with ball on plinth 10sec x5 [id number] with cane BP seated 158/99 5xSTS 16.1 sec Supine LTR x10 5sec hold Supine SKTC 20sec x2ea Supine clam GTB x20   9/24 Assessed patient low back  Manual: trigger point release to  bilateral hips and lower back  LAD bilateral but patient was guarded so minimal LAD provided   LAQ 2x10 red  Hip abduction 2x10 red  Seated march red 2x10   Mild dyspnea noted with there-ex     9/19  Manual: trigger point release to bilateral hips and lower back  LAD bilateral but patient was guarded so minimal LAD provided    CLINICAL IMPRESSION:  Pt stated session with excellent BP, however, after any exercise, his standing BP drops significantly. Performed gentle seated tasks today, but after drop in BP, held exercises. Pt will continue to monitor BP at home. He sees PCP tomorrow morning for f/u.   RE-EVAL: herapy performed assessment of patient back. He has increased pain with lumbar flexion and right rotation. He continues to have a significant spasm in his left gluteal. His right knee is sore at this time. Since the onset of his back pain his muscle strength has decreased. He has more weakness on the right side today. We were able to re-start LE exercises today with minimal pain. We continue to work on manual therapy to the low back. We will continue to work on balance, endurance, and low back pain  2W8. We did not assess 5x sit to stand or 6 min walk today 2nd to lumbar spine. We will hopefully re-assess over the next few visits. We were abe to integrate there-ex back into his program today. He tolerated well.     OBJECTIVE IMPAIRMENTS: decreased activity tolerance, decreased balance, decreased endurance, difficulty walking, decreased strength, and dizziness.   ACTIVITY LIMITATIONS: carrying, lifting, bending, standing, squatting, sleeping, and stairs  PARTICIPATION LIMITATIONS: meal prep, cleaning, laundry, driving, shopping, community activity, and yard work  PERSONAL FACTORS: 3+ comorbidities: type 2 diabetes, orthostatic hypotension, sleep apnea, thoracic aortic anuerysm  are also affecting patient's functional outcome.   REHAB POTENTIAL: Good  CLINICAL DECISION  MAKING: Evolving/moderate complexity  EVALUATION COMPLEXITY: Moderate   GOALS: Goals reviewed with patient? Yes  SHORT TERM GOALS: Target date: 5/9 Patient will increase bilateral strength by 5lbs. Baseline: Goal status: Significant improvement in strength overall initial goal which she achieved 7/23 new goal 5 pounds from measurements on 7/23  2.  Patient will be independent with HEP. Baseline:  Goal status: Performing base HEP at home 6/12  3.  Patient will show minimal fluctuation and syncope with sit to stand transfer  Baseline:  Goal status: Minimal syncope.  Continues to have fluctuations in blood pressure  4. Patient will demonstrate full lumbar flexion and rotation without pain  Goal status : new   5. Patient will increase gross bilateral LE strength to 5/5   LONG TERM GOALS: Target date: 01/27/2023    Patient will transfer sit to stand without loss of balance or syncope  Baseline:  Goal status: MET 6/4  2.  Patient will perform daily activity with no loss of balance  Baseline:  Goal status: IN PROGRESS 6/12 improving but still some baseline balance issues goal continues to improve 7/23  3.  Patient will be able to walk community distances without any discomfort in left hip or feeling of lost balance .  Baseline:  Goal status: Mild pain in his hip but improving ability to ambulate in the community 7/23      PLAN:  PT FREQUENCY: 1-2x/week  PT DURATION: 10 weeks  PLANNED INTERVENTIONS: Therapeutic exercises, Therapeutic activity, Neuromuscular re-education, Balance training, Gait training, Patient/Family education, Self Care, Joint mobilization, Stair training, Aquatic Therapy, Electrical stimulation, Cryotherapy, Moist heat, Taping, Ultrasound, Ionotophoresis 4mg /ml Dexamethasone, and Manual therapy  PLAN FOR NEXT SESSION: Consider TUG, 5 STS test, 6 minute walk, screen balance; adjust goals review HEP   Riki Altes, PTA   11/14

## 2023-07-01 ENCOUNTER — Ambulatory Visit (INDEPENDENT_AMBULATORY_CARE_PROVIDER_SITE_OTHER): Payer: Medicare HMO | Admitting: Family Medicine

## 2023-07-01 ENCOUNTER — Other Ambulatory Visit: Payer: Self-pay

## 2023-07-01 ENCOUNTER — Encounter: Payer: Self-pay | Admitting: Family Medicine

## 2023-07-01 VITALS — BP 142/84 | HR 84 | Wt 258.0 lb

## 2023-07-01 DIAGNOSIS — I10 Essential (primary) hypertension: Secondary | ICD-10-CM

## 2023-07-01 DIAGNOSIS — I951 Orthostatic hypotension: Secondary | ICD-10-CM | POA: Diagnosis not present

## 2023-07-01 DIAGNOSIS — M8000XA Age-related osteoporosis with current pathological fracture, unspecified site, initial encounter for fracture: Secondary | ICD-10-CM

## 2023-07-01 NOTE — Progress Notes (Signed)
OFFICE VISIT  07/01/2023  CC:  Chief Complaint  Patient presents with   Blood Pressure Check    Pt has been taking BP at home, does not have readings with him. Pt states BP has been reading 140/70.     Patient is a 87 y.o. male who presents for 1 week follow-up elevated bp's. A/P as of last visit: "#1 elevated blood pressures.  Complicated blood pressure history -->he has a history of hypertension as well as orthostatic hypotension. He is currently on losartan 25 mg a day and Lopressor 25 mg twice a day in addition to midodrine 10 mg 3 times a day. Blood pressures normal here today both sitting and standing. No changes today but I will prescribe hydralazine 25 mg tab for him to take as needed blood pressure greater than 170 systolic or greater than 100 diastolic.  I wrote down these parameters for him today and he will continue to check blood pressure 1-2 times a day and bring these in for review in 7 to 10 days.   #2 weight management in the setting of type 2 diabetes. Doing well on Mounjaro 5 mg weekly."  INTERIM HX: Elvia is feeling well. He still has intermittent periods of orthostatic dizziness that typically lasts a few seconds and then resolved. He is only had 1 blood pressure spike that prompted hydralazine use.  He takes his midodrine in the morning and again in the early afternoon.  Past Medical History:  Diagnosis Date   Anxiety and depression    Ascending aortic aneurysm (HCC) 01/22/2022   CAD in native artery 01/22/2022   Chronic renal insufficiency, stage 3 (moderate) (HCC)    Diabetes mellitus without complication (HCC)    Gait instability 01/22/2022   GERD (gastroesophageal reflux disease)    Gout    Hypercholesterolemia    Hypertension    Lumbar compression fracture (HCC)    L2, 20%, chronic (imaging 2024).  DEXA -1.0 04/2023   Orthostatic hypotension    OSA (obstructive sleep apnea)    Osteoarthritis of left hip    + troch burs   Prostate cancer (HCC)     1990-->prostatectomy, released from urol x many years   Urinary incontinence     Past Surgical History:  Procedure Laterality Date   CHOLECYSTECTOMY, LAPAROSCOPIC     1992   DEXA     T score -1.0 Sept 2024   ROBOT ASSISTED LAPAROSCOPIC RADICAL PROSTATECTOMY     1999   ROTATOR CUFF REPAIR     Left 1991, right 1997   TOTAL KNEE ARTHROPLASTY     bilat 2004   TRANSTHORACIC ECHOCARDIOGRAM     11/24/22, EF nl, +LVH with no WMA, grd I DD, valves ok (no amyloid changes)    Outpatient Medications Prior to Visit  Medication Sig Dispense Refill   allopurinol (ZYLOPRIM) 300 MG tablet Take 1 tablet (300 mg total) by mouth daily. 90 tablet 1   aspirin EC 81 MG tablet Take 1 tablet every day by oral route.     atorvastatin (LIPITOR) 40 MG tablet Take 1 tablet (40 mg total) by mouth daily. 90 tablet 1   b complex vitamins capsule Take 1 capsule by mouth daily.     Continuous Glucose Receiver (FREESTYLE LIBRE 3 READER) DEVI Inject 1 each into the skin every 14 (fourteen) days. 2 each 2   Continuous Glucose Sensor (FREESTYLE LIBRE 3 SENSOR) MISC Inject 1 each into the skin every 14 (fourteen) days. 2 each 2  fluticasone (FLONASE) 50 MCG/ACT nasal spray Place 1-2 sprays into both nostrils daily. 16 g 6   glimepiride (AMARYL) 1 MG tablet Take 1 tablet (1 mg total) by mouth daily with breakfast. 90 tablet 1   glucose blood test strip E11.9 Use to test blood sugar twice daily or as needed 100 each 12   hydrALAZINE (APRESOLINE) 25 MG tablet 1 tab p.o. as needed for blood pressure greater than 170 systolic or greater than 100 diastolic 30 tablet 0   ipratropium (ATROVENT) 0.03 % nasal spray PLACE 2 SPRAYS INTO BOTH NOSTRILS EVERY 12 (TWELVE) HOURS. USE 15 TO 30 MIN PRIOR TO BREAKFAST AND SUPPER 30 mL 6   losartan (COZAAR) 25 MG tablet Take 1 tablet (25 mg total) by mouth at bedtime. 90 tablet 2   metFORMIN (GLUCOPHAGE) 500 MG tablet TAKE 2 TABLETS (1,000 MG TOTAL) BY MOUTH 2 (TWO) TIMES DAILY WITH A MEAL.  360 tablet 0   metoprolol tartrate (LOPRESSOR) 25 MG tablet TAKE 1 TABLET BY MOUTH TWICE A DAY 180 tablet 3   midodrine (PROAMATINE) 10 MG tablet TAKE 1 TABLET BEFORE GETTING OUT OF BED OR SOON AFTER, 1 TABLET AT 12PM, 1 TABLET AROUND 4PM (Patient taking differently: TAKE 1 TABLET BEFORE GETTING OUT OF BED OR SOON AFTER, 1 TABLET AT 12PM.) 270 tablet 2   Multiple Vitamins-Minerals (ICAPS AREDS 2 PO) TAKE 1 CAPSULE BY MOUTH TWICE A DAY FOR MACULAR DEGENERATION     omeprazole (PRILOSEC) 20 MG capsule Take 1 capsule (20 mg total) by mouth daily. 90 capsule 1   pioglitazone (ACTOS) 15 MG tablet Take 1 tablet (15 mg total) by mouth daily. 90 tablet 1   tirzepatide (MOUNJARO) 5 MG/0.5ML Pen Inject 5 mg into the skin once a week. 6 mL 3   venlafaxine (EFFEXOR) 75 MG tablet Take 1 tablet (75 mg total) by mouth daily. 90 tablet 1   Lancet Device MISC Dispense new lancet device based on patient insurance and current one touch delica products 1 each 0   OneTouch Delica Lancets 33G MISC E11.9 Use to test blood sugar twice daily or as needed 100 each 5   No facility-administered medications prior to visit.    Allergies  Allergen Reactions   Lisinopril Cough, Swelling and Other (See Comments)    Review of Systems As per HPI  PE:    07/01/2023    1:36 PM 07/01/2023    1:29 PM 06/24/2023   10:14 AM  Vitals with BMI  Weight  258 lbs 259 lbs  Systolic 142 163 086  Diastolic 84 95 75  Pulse  84 78     Physical Exam  Gen: Alert, well appearing.  Patient is oriented to person, place, time, and situation. AFFECT: pleasant, lucid thought and speech. Extremities: No edema. He has compression stockings on. No further exam today.  LABS:  Last metabolic panel Lab Results  Component Value Date   GLUCOSE 207 (H) 05/06/2023   NA 138 05/06/2023   K 5.3 05/06/2023   CL 101 05/06/2023   CO2 26 05/06/2023   BUN 22 05/06/2023   CREATININE 1.43 (H) 05/06/2023   GFR 51.19 (L) 01/26/2023   CALCIUM  9.6 05/06/2023   PROT 6.9 05/06/2023   ALBUMIN 4.1 01/26/2023   BILITOT 0.5 05/06/2023   ALKPHOS 73 01/26/2023   AST 24 05/06/2023   ALT 25 05/06/2023   Lab Results  Component Value Date   HGBA1C 7.0 (H) 05/06/2023   IMPRESSION AND PLAN:  #1 hypertension  and orthostatic hypotension. Stable on losartan 25 mg a day and hydralazine 25 mg as needed systolic blood pressure greater than 170 or diastolic greater than 100. He can take his midodrine up to 3 times a day as long as he does not take it within 4 hours of laying supine to go to sleep.  An After Visit Summary was printed and given to the patient.  FOLLOW UP: Return for 5-6 wks f/u [facility].  Signed:  Santiago Bumpers, MD           07/01/2023

## 2023-07-05 ENCOUNTER — Ambulatory Visit (HOSPITAL_BASED_OUTPATIENT_CLINIC_OR_DEPARTMENT_OTHER): Payer: Medicare HMO

## 2023-07-05 ENCOUNTER — Encounter (HOSPITAL_BASED_OUTPATIENT_CLINIC_OR_DEPARTMENT_OTHER): Payer: Self-pay

## 2023-07-05 DIAGNOSIS — R262 Difficulty in walking, not elsewhere classified: Secondary | ICD-10-CM

## 2023-07-05 DIAGNOSIS — R2681 Unsteadiness on feet: Secondary | ICD-10-CM

## 2023-07-05 DIAGNOSIS — M62830 Muscle spasm of back: Secondary | ICD-10-CM

## 2023-07-05 NOTE — Therapy (Signed)
OUTPATIENT PHYSICAL THERAPY LOWER EXTREMITY TREATMENT   Patient Name: Kamani Rutenberg MRN: [id number] DOB:Sep 08, 1935, 87 y.o., male Today's Date: 07/05/2023  END OF SESSION:  PT End of Session - 07/05/23 1123     Visit Number 33    Number of Visits 42    Date for PT Re-Evaluation 07/05/23    Authorization Type progress note at 34.    PT Start Time 1108    PT Stop Time 1113    PT Time Calculation (min) 5 min                         Past Medical History:  Diagnosis Date   Anxiety and depression    Ascending aortic aneurysm (HCC) 01/22/2022   CAD in native artery 01/22/2022   Chronic renal insufficiency, stage 3 (moderate) (HCC)    Diabetes mellitus without complication (HCC)    Gait instability 01/22/2022   GERD (gastroesophageal reflux disease)    Gout    Hypercholesterolemia    Hypertension    Lumbar compression fracture (HCC)    L2, 20%, chronic (imaging 2024).  DEXA -1.0 04/2023   Orthostatic hypotension    OSA (obstructive sleep apnea)    Osteoarthritis of left hip    + troch burs   Prostate cancer (HCC)    1990-->prostatectomy, released from urol x many years   Urinary incontinence    Past Surgical History:  Procedure Laterality Date   CHOLECYSTECTOMY, LAPAROSCOPIC     1992   DEXA     T score -1.0 Sept 2024   ROBOT ASSISTED LAPAROSCOPIC RADICAL PROSTATECTOMY     1999   ROTATOR CUFF REPAIR     Left 1991, right 1997   TOTAL KNEE ARTHROPLASTY     bilat 2004   TRANSTHORACIC ECHOCARDIOGRAM     11/24/22, EF nl, +LVH with no WMA, grd I DD, valves ok (no amyloid changes)   Patient Active Problem List   Diagnosis Date Noted   Type 2 diabetes mellitus with obesity (HCC) 06/03/2023   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   Obstructive sleep apnea of adult 08/11/2022   Diabetic nephropathy with proteinuria (HCC) 08/11/2022   Obesity 08/11/2022   Hyperlipidemia 08/11/2022   Stage 3a chronic kidney disease (HCC) 08/11/2022   Anxiety disorder, unspecified  08/11/2022   Diabetes mellitus (HCC) 08/11/2022   Primary malignant neoplasm of prostate (HCC) 08/02/2022   Carcinoma of prostate (HCC) 08/02/2022   Shortness of breath 02/22/2022   Pure hypercholesterolemia 02/22/2022   Pain in joint, shoulder region 02/22/2022   Other malaise and fatigue 02/22/2022   Nonexudative age-related macular degeneration, bilateral, intermediate dry stage 02/22/2022   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   Gastroesophageal reflux disease 02/22/2022   Benign paroxysmal positional vertigo 02/22/2022   CAD in native artery 01/22/2022   Thoracic aortic aneurysm, without rupture, unspecified (HCC) 01/22/2022   Gait instability 01/22/2022   Essential hypertension 01/17/2022   Type 2 diabetes mellitus without complications (HCC) 01/17/2022   HLD (hyperlipidemia) 01/17/2022   Obstructive sleep apnea 01/17/2022   Sensorineural hearing loss, bilateral 11/09/2021   Orthostatic hypotension 11/09/2021   Imbalance 11/09/2021   Overweight 02/21/2020   First degree heart block 02/21/2020   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   Malignant tumor of prostate (HCC) 02/21/2020   Vitamin D deficiency 05/31/2016   History of malignant neoplasm of prostate 05/31/2016   History of malignant neoplasm of skin 05/31/2016   Bilateral hearing loss 05/31/2016   Anxiety state 05/31/2016   Bronchospasm  09/24/2010   Cough 09/19/2010   Acute bronchitis 09/19/2010   Allergic rhinitis due to pollen 09/19/2010   Acute sinusitis 09/19/2010    PCP: Meredith Staggers, MD  REFERRING PROVIDER: Meredith Staggers, MD  REFERRING DIAG: R26.81 (ICD-10-CM) - Unsteadiness   THERAPY DIAG:  Unsteadiness on feet  Muscle spasm of back  Difficulty in walking, not elsewhere classified  Rationale for Evaluation and Treatment: Rehabilitation  ONSET DATE: march 2024  SUBJECTIVE:   SUBJECTIVE STATEMENT: Pt reports he is not feeling his best today. "I don't know if I'll be able to do PT today."  PERTINENT  HISTORY: Type 2 diabetes, sleep apnea, thoracic aortic aneurysm, orthorstatic hypotension  PAIN:  Are you having pain? No  PRECAUTIONS: None  WEIGHT BEARING RESTRICTIONS: No  FALLS:  Has patient fallen in last 6 months? Yes. Number of falls 2, step done on a small step. Another one reaching down and fell  LIVING ENVIRONMENT: Lives with: lives with their family Lives in: House/apartment Stairs: No Has following equipment at home: Single point cane  OCCUPATION: retired   PLOF: Independent  PATIENT GOALS: walking without pain, being more stable  NEXT MD VISIT: nothing scheduled   OBJECTIVE:      DIAGNOSTIC FINDINGS: nothing pertinent   PATIENT SURVEYS:  FOTO    COGNITION: Overall cognitive status: Within functional limits for tasks assessed     SENSATION: WFL  EDEMA:   MUSCLE LENGTH:   POSTURE: rounded shoulders  PALPATION:   LOWER EXTREMITY ROM:  Active ROM Right eval Left eval  Hip flexion    Hip extension    Hip abduction    Hip adduction    Hip internal rotation    Hip external rotation    Knee flexion    Knee extension    Ankle dorsiflexion    Ankle plantarflexion    Ankle inversion    Ankle eversion     (Blank rows = not tested)  LOWER EXTREMITY MMT:  MMT Right eval Left eval Right 6/11  Left  6/11 Right 7/23 Left 7/23 Right  9/24 Left  9/24  Hip flexion 19.5 19.3 painful 25.0 23.9 32.4 26.3 14.8 26.3  Hip extension          Hip abduction 22.5 27.1 42.1 41.2 46. 33.5 32.9 28.8  Hip adduction          Hip internal rotation          Hip external rotation          Knee flexion          Knee extension 27.4 33.2 45.1 42.5 45.4 52.3 30.8 32.2   Ankle dorsiflexion          Ankle plantarflexion          Ankle inversion          Ankle eversion           (Blank rows = not tested)  LOWER EXTREMITY SPECIAL TESTS:    EVAL: Vitals:  Patient Initial blood pressure seated was 165/118.  Patient blood pressure was retaken after a  few minutes and was 171/103. Patient blood pressure was also taken standing, 134/78 FUNCTIONAL TESTS:  Eval:TUG test: 16sec- No AD 5xSTS: 27sec (used arms to push up)  6/11  5XSTS 19  TUG 17 without AD   6 min walk test: 2.5 minutes until for seated rest break seated rest break of 2 minutes.  After rest break able to ambulate until 5:30 mark at which time he  needed a seated rest break for the rest of the test.  Total distance 527 feet.  7/23 5x STS 13  6 min walk test 875 no rest break  After walk test 185/105   9/26  6 min walk test-775ft with cane 5xSTS- 16.10     GAIT: Patients uses a SPC to ambulate.  Balance:   6/11 Narrow base of support standby assist Narrow base of support eyes closed min assist Tandem stance min assist bilateral Single-leg stance unable to maintain  7/23 Narrow base of support standby assist Narrow base of support eyes closed min assist Tandem stance min assist bilateral Single-leg stance unable to maintain  9/24 7/23 Narrow base of support standby assist Narrow base of support eyes closed min assist Tandem stance min assist bilateral Single-leg stance unable to maintain  No change from previous visit   TODAY'S TREATMENT:                                                                                                                              DATE:    11/19: 152/83 seated 101/65 standing   11/14:  130/87 seated 119/80 standing Nu-step L4 119/82 seated 96/65 Standing Waited a minute, took again 81/66 standing Seated marching 2# weight 2x20 LAQ 3# 2x10ea 67/55 standing Waited a few minutes took again 86/69 standing 127/86 seated  11/4 Trigger point release to right upper trap; review of self trigger point release and upper trap stretch   B/P   Sitting: 183/100  And 190 /103   No exercises performed    Reviewed B/P  10/29 Seated: 114/78 Standing: 88/64 (asymptomatic)  Nu-step L5  UE/LE Standing: 67/52 Standing: 97/69 Seated: 134/85  Punches 2 lbs 3x15  Biceps curls 2lbs 3x15  Seated march 2x15 2.5# weights Seated LAQ 3x10 2.5#weights  Standing: 91/68 end of session    10/24 Seated: 113/80 Standing: 86/69  Later in session  Seated 126/80 Standing: 86/89  Punches 2 lbs 3x15  Biceps curls 2lbs 3x15  Hip abduction 3x15  RTB Seated march 2x15 RTB Seated LAQ 3x10 2.5#weights  Nu-step 5 min   06/06/23: Seated: 127/83 Standing: 81/64 Nu-step L4 Hip abduction 3x15  RTB Seated march 2x15 RTB Seated LAQ 3x10 2.5#weights  Seated: 120/84 Standing:123/110   1014/ Seated 143/93  Standing 90/64  Hip abduction 3x15  Seated march 2x15  Seated LAQ 3x10  All with a red band   Bilateral er 3x15 red  Horizontal abduction 3x15 red  Wand flexion 2x15       9/26:  Beginning of session 140/90 seated BP 115/75 standing BP Seated lumbar flexion stretch with ball on plinth 10sec x5 6MWT-752ft with cane BP seated 158/99 5xSTS 16.1 sec Supine LTR x10 5sec hold Supine SKTC 20sec x2ea Supine clam GTB x20   9/24 Assessed patient low back  Manual: trigger point release to bilateral hips and lower back  LAD bilateral but patient was guarded so minimal  LAD provided   LAQ 2x10 red  Hip abduction 2x10 red  Seated march red 2x10   Mild dyspnea noted with there-ex     9/19  Manual: trigger point release to bilateral hips and lower back  LAD bilateral but patient was guarded so minimal LAD provided    CLINICAL IMPRESSION:  Pt not feeling well today and elected to cancel PT session for today.  RE-EVAL: herapy performed assessment of patient back. He has increased pain with lumbar flexion and right rotation. He continues to have a significant spasm in his left gluteal. His right knee is sore at this time. Since the onset of his back pain his muscle strength has decreased. He has more weakness on the right side today. We were able to  re-start LE exercises today with minimal pain. We continue to work on manual therapy to the low back. We will continue to work on balance, endurance, and low back pain  2W8. We did not assess 5x sit to stand or 6 min walk today 2nd to lumbar spine. We will hopefully re-assess over the next few visits. We were abe to integrate there-ex back into his program today. He tolerated well.     OBJECTIVE IMPAIRMENTS: decreased activity tolerance, decreased balance, decreased endurance, difficulty walking, decreased strength, and dizziness.   ACTIVITY LIMITATIONS: carrying, lifting, bending, standing, squatting, sleeping, and stairs  PARTICIPATION LIMITATIONS: meal prep, cleaning, laundry, driving, shopping, community activity, and yard work  PERSONAL FACTORS: 3+ comorbidities: type 2 diabetes, orthostatic hypotension, sleep apnea, thoracic aortic anuerysm  are also affecting patient's functional outcome.   REHAB POTENTIAL: Good  CLINICAL DECISION MAKING: Evolving/moderate complexity  EVALUATION COMPLEXITY: Moderate   GOALS: Goals reviewed with patient? Yes  SHORT TERM GOALS: Target date: 5/9 Patient will increase bilateral strength by 5lbs. Baseline: Goal status: Significant improvement in strength overall initial goal which she achieved 7/23 new goal 5 pounds from measurements on 7/23  2.  Patient will be independent with HEP. Baseline:  Goal status: Performing base HEP at home 6/12  3.  Patient will show minimal fluctuation and syncope with sit to stand transfer  Baseline:  Goal status: Minimal syncope.  Continues to have fluctuations in blood pressure  4. Patient will demonstrate full lumbar flexion and rotation without pain  Goal status : new   5. Patient will increase gross bilateral LE strength to 5/5   LONG TERM GOALS: Target date: 01/27/2023    Patient will transfer sit to stand without loss of balance or syncope  Baseline:  Goal status: MET 6/4  2.  Patient will perform  daily activity with no loss of balance  Baseline:  Goal status: IN PROGRESS 6/12 improving but still some baseline balance issues goal continues to improve 7/23  3.  Patient will be able to walk community distances without any discomfort in left hip or feeling of lost balance .  Baseline:  Goal status: Mild pain in his hip but improving ability to ambulate in the community 7/23      PLAN:  PT FREQUENCY: 1-2x/week  PT DURATION: 10 weeks  PLANNED INTERVENTIONS: Therapeutic exercises, Therapeutic activity, Neuromuscular re-education, Balance training, Gait training, Patient/Family education, Self Care, Joint mobilization, Stair training, Aquatic Therapy, Electrical stimulation, Cryotherapy, Moist heat, Taping, Ultrasound, Ionotophoresis 4mg /ml Dexamethasone, and Manual therapy  PLAN FOR NEXT SESSION: Consider TUG, 5 STS test, 6 minute walk, screen balance; adjust goals review HEP   Riki Altes, PTA

## 2023-07-06 NOTE — Telephone Encounter (Signed)
No further action needed.

## 2023-07-07 ENCOUNTER — Other Ambulatory Visit: Payer: Medicare HMO

## 2023-07-07 ENCOUNTER — Encounter (HOSPITAL_BASED_OUTPATIENT_CLINIC_OR_DEPARTMENT_OTHER): Payer: Self-pay | Admitting: Physical Therapy

## 2023-07-11 ENCOUNTER — Ambulatory Visit (HOSPITAL_BASED_OUTPATIENT_CLINIC_OR_DEPARTMENT_OTHER): Payer: Medicare HMO | Admitting: Physical Therapy

## 2023-07-11 ENCOUNTER — Encounter (HOSPITAL_BASED_OUTPATIENT_CLINIC_OR_DEPARTMENT_OTHER): Payer: Self-pay

## 2023-07-12 ENCOUNTER — Ambulatory Visit: Payer: Medicare HMO | Admitting: "Endocrinology

## 2023-07-20 ENCOUNTER — Other Ambulatory Visit: Payer: Self-pay | Admitting: Family Medicine

## 2023-07-22 ENCOUNTER — Ambulatory Visit (INDEPENDENT_AMBULATORY_CARE_PROVIDER_SITE_OTHER): Payer: Medicare HMO | Admitting: Family Medicine

## 2023-07-22 ENCOUNTER — Encounter: Payer: Self-pay | Admitting: Family Medicine

## 2023-07-22 VITALS — BP 154/88 | HR 86 | Wt 254.4 lb

## 2023-07-22 DIAGNOSIS — M67813 Other specified disorders of tendon, right shoulder: Secondary | ICD-10-CM | POA: Diagnosis not present

## 2023-07-22 DIAGNOSIS — I951 Orthostatic hypotension: Secondary | ICD-10-CM

## 2023-07-22 DIAGNOSIS — M25511 Pain in right shoulder: Secondary | ICD-10-CM | POA: Diagnosis not present

## 2023-07-22 DIAGNOSIS — M7551 Bursitis of right shoulder: Secondary | ICD-10-CM | POA: Diagnosis not present

## 2023-07-22 DIAGNOSIS — I10 Essential (primary) hypertension: Secondary | ICD-10-CM | POA: Diagnosis not present

## 2023-07-22 NOTE — Progress Notes (Signed)
OFFICE VISIT  07/29/2023  CC:  Chief Complaint  Patient presents with   Hypertension    F/U. Pt wants to discuss Losartan and Metoprolol, as well as his midodrine.     Patient is a 87 y.o. male who presents for 3-week follow-up hypertension and orthostatic hypotension. A/P as of last visit: " hypertension and orthostatic hypotension. Stable on losartan 25 mg a day and hydralazine 25 mg as needed systolic blood pressure greater than 170 or diastolic greater than 100. He can take his midodrine up to 3 times a day as long as he does not take it within 4 hours of laying supine to go to sleep."  INTERIM HX: Home blood pressure is typically show 150s to 160s over 80s to 90s.  However, there is a fairly significant amount drops into the 90s systolic when he stands up, significant dizziness with this.  No falls. He has only had to take his hydralazine twice since last visit.  Onset a couple weeks ago with right shoulder pain. No preceding strain or trauma. History of rotator cuff surgery on each shoulder in the remote past.  Recalls that a cortisone injection in the right shoulder approximately a year ago did help significantly. No neck pain.  No radiating arm pain.  No arm weakness.  ROS as above, plus--> no fevers, no CP, no SOB, no wheezing, no cough, no vertigo, no HAs, no rashes.  No polyuria or polydipsia.   No focal weakness, paresthesias, or tremors.  No acute vision or hearing abnormalities. No recent changes in lower legs. No palpitations.    Past Medical History:  Diagnosis Date   Anxiety and depression    Ascending aortic aneurysm (HCC) 01/22/2022   CAD in native artery 01/22/2022   Chronic renal insufficiency, stage 3 (moderate) (HCC)    Diabetes mellitus without complication (HCC)    Gait instability 01/22/2022   GERD (gastroesophageal reflux disease)    Gout    Hypercholesterolemia    Hypertension    Lumbar compression fracture (HCC)    L2, 20%, chronic (imaging  2024).  DEXA -1.0 04/2023   Orthostatic hypotension    OSA (obstructive sleep apnea)    Osteoarthritis of left hip    + troch burs   Prostate cancer (HCC)    1990-->prostatectomy, released from urol x many years   Urinary incontinence     Past Surgical History:  Procedure Laterality Date   CHOLECYSTECTOMY, LAPAROSCOPIC     1992   DEXA     T score -1.0 Sept 2024   ROBOT ASSISTED LAPAROSCOPIC RADICAL PROSTATECTOMY     1999   ROTATOR CUFF REPAIR     Left 1991, right 1997   TOTAL KNEE ARTHROPLASTY     bilat 2004   TRANSTHORACIC ECHOCARDIOGRAM     11/24/22, EF nl, +LVH with no WMA, grd I DD, valves ok (no amyloid changes)    Outpatient Medications Prior to Visit  Medication Sig Dispense Refill   allopurinol (ZYLOPRIM) 300 MG tablet Take 1 tablet (300 mg total) by mouth daily. 90 tablet 1   aspirin EC 81 MG tablet Take 1 tablet every day by oral route.     atorvastatin (LIPITOR) 40 MG tablet Take 1 tablet (40 mg total) by mouth daily. 90 tablet 1   b complex vitamins capsule Take 1 capsule by mouth daily.     Continuous Glucose Receiver (FREESTYLE LIBRE 3 READER) DEVI Inject 1 each into the skin every 14 (fourteen) days. 2 each 2  Continuous Glucose Sensor (FREESTYLE LIBRE 3 SENSOR) MISC Inject 1 each into the skin every 14 (fourteen) days. 2 each 2   fluticasone (FLONASE) 50 MCG/ACT nasal spray Place 1-2 sprays into both nostrils daily. 16 g 6   glimepiride (AMARYL) 1 MG tablet Take 1 tablet (1 mg total) by mouth daily with breakfast. 90 tablet 1   glucose blood test strip E11.9 Use to test blood sugar twice daily or as needed 100 each 12   hydrALAZINE (APRESOLINE) 25 MG tablet 1 tab p.o. as needed for blood pressure greater than 170 systolic or greater than 100 diastolic 30 tablet 0   ipratropium (ATROVENT) 0.03 % nasal spray PLACE 2 SPRAYS INTO BOTH NOSTRILS EVERY 12 (TWELVE) HOURS. USE 15 TO 30 MIN PRIOR TO BREAKFAST AND SUPPER 30 mL 6   losartan (COZAAR) 25 MG tablet Take 1  tablet (25 mg total) by mouth at bedtime. 90 tablet 2   metFORMIN (GLUCOPHAGE) 500 MG tablet TAKE 2 TABLETS (1,000 MG TOTAL) BY MOUTH 2 (TWO) TIMES DAILY WITH A MEAL. 360 tablet 0   metoprolol tartrate (LOPRESSOR) 25 MG tablet TAKE 1 TABLET BY MOUTH TWICE A DAY 180 tablet 3   midodrine (PROAMATINE) 10 MG tablet TAKE 1 TABLET BEFORE GETTING OUT OF BED OR SOON AFTER, 1 TABLET AT 12PM, 1 TABLET AROUND 4PM (Patient taking differently: TAKE 1 TABLET BEFORE GETTING OUT OF BED OR SOON AFTER, 1 TABLET AT 12PM.) 270 tablet 2   Multiple Vitamins-Minerals (ICAPS AREDS 2 PO) TAKE 1 CAPSULE BY MOUTH TWICE A DAY FOR MACULAR DEGENERATION     omeprazole (PRILOSEC) 20 MG capsule Take 1 capsule (20 mg total) by mouth daily. 90 capsule 1   pioglitazone (ACTOS) 15 MG tablet Take 1 tablet (15 mg total) by mouth daily. 90 tablet 1   tirzepatide (MOUNJARO) 5 MG/0.5ML Pen Inject 5 mg into the skin once a week. 6 mL 3   venlafaxine (EFFEXOR) 75 MG tablet Take 1 tablet (75 mg total) by mouth daily. 90 tablet 1   No facility-administered medications prior to visit.    Allergies  Allergen Reactions   Lisinopril Cough, Swelling and Other (See Comments)    Review of Systems As per HPI  PE:    07/22/2023   11:14 AM 07/22/2023   10:27 AM 07/01/2023    1:36 PM  Vitals with BMI  Weight  254 lbs 6 oz   Systolic 154 164 161  Diastolic 88 93 84  Pulse  86      Physical Exam  Gen: Alert, well appearing.  Patient is oriented to person, place, time, and situation. CV: RRR Right shoulder with no tenderness.  He can ABduct to about 75 degrees.  Positive Hawkins and Neer's.  Negative speeds.  Some discomfort with resisted external rotation greater than internal rotation.  Equivocal scarf sign.  Right arm strength 5 out of 5 proximally and distally. Bedside MSK ultrasound today: Biceps tendon normal in short and long views. Subscap with hypoechoic changes, fully intact. AC joint with some degenerative changes, negative  geyser sign. There is mild subacromial bursitis with some impingement on dynamic imaging. Supraspinatus with diffuse thickening and hypoechoic changes without disruption of the fibers. Posterior glenohumeral joint without effusion.  LABS:  Last CBC Lab Results  Component Value Date   WBC 7.9 05/06/2023   HGB 13.5 05/06/2023   HCT 42.0 05/06/2023   MCV 90.3 05/06/2023   MCH 29.0 05/06/2023   RDW 13.8 05/06/2023   PLT 276 05/06/2023  Last metabolic panel Lab Results  Component Value Date   GLUCOSE 207 (H) 05/06/2023   NA 138 05/06/2023   K 5.3 05/06/2023   CL 101 05/06/2023   CO2 26 05/06/2023   BUN 22 05/06/2023   CREATININE 1.43 (H) 05/06/2023   GFR 51.19 (L) 01/26/2023   CALCIUM 9.6 05/06/2023   PROT 6.9 05/06/2023   ALBUMIN 4.1 01/26/2023   BILITOT 0.5 05/06/2023   ALKPHOS 73 01/26/2023   AST 24 05/06/2023   ALT 25 05/06/2023   Last lipids Lab Results  Component Value Date   CHOL 112 05/06/2023   HDL 41 05/06/2023   LDLCALC 48 05/06/2023   TRIG 146 05/06/2023   CHOLHDL 2.7 05/06/2023   Last hemoglobin A1c Lab Results  Component Value Date   HGBA1C 7.0 (H) 05/06/2023   Last thyroid functions Lab Results  Component Value Date   TSH 3.410 04/12/2022   T4TOTAL 7.1 04/12/2022   Last vitamin B12 and Folate Lab Results  Component Value Date   VITAMINB12 422 04/12/2022   FOLATE 18.5 04/12/2022   IMPRESSION AND PLAN:  #1 orthostatic hypotension in the setting of chronic hypertension. Discussed the necessity of some permissive hypertension at this time. His current regimen is one half of the Lopressor 25 mg tab twice a day, losartan 25 mg a day, and hydralazine 25 mg on a as needed basis for systolic blood pressure greater than 170 or diastolic blood pressure greater than 100.  #2 right shoulder rotator cuff tendinosis with subacromial/subdeltoid bursitis. Plan is for steroid injection at next follow-up on 08/05/2023.  An After Visit Summary was printed  and given to the patient.  FOLLOW UP: Keep follow-up set for 08/05/2023  Signed:  Santiago Bumpers, MD           07/29/2023

## 2023-07-28 ENCOUNTER — Ambulatory Visit: Payer: Medicare HMO | Admitting: Family Medicine

## 2023-08-05 ENCOUNTER — Ambulatory Visit: Payer: Medicare HMO | Admitting: Family Medicine

## 2023-08-05 ENCOUNTER — Encounter: Payer: Self-pay | Admitting: Family Medicine

## 2023-08-05 VITALS — BP 144/82 | HR 80 | Wt 252.2 lb

## 2023-08-05 DIAGNOSIS — M25511 Pain in right shoulder: Secondary | ICD-10-CM

## 2023-08-05 DIAGNOSIS — Z7985 Long-term (current) use of injectable non-insulin antidiabetic drugs: Secondary | ICD-10-CM

## 2023-08-05 DIAGNOSIS — N1831 Chronic kidney disease, stage 3a: Secondary | ICD-10-CM

## 2023-08-05 DIAGNOSIS — I10 Essential (primary) hypertension: Secondary | ICD-10-CM | POA: Diagnosis not present

## 2023-08-05 DIAGNOSIS — R2681 Unsteadiness on feet: Secondary | ICD-10-CM

## 2023-08-05 DIAGNOSIS — I951 Orthostatic hypotension: Secondary | ICD-10-CM | POA: Diagnosis not present

## 2023-08-05 DIAGNOSIS — E119 Type 2 diabetes mellitus without complications: Secondary | ICD-10-CM

## 2023-08-05 DIAGNOSIS — M67813 Other specified disorders of tendon, right shoulder: Secondary | ICD-10-CM | POA: Diagnosis not present

## 2023-08-05 DIAGNOSIS — Z7984 Long term (current) use of oral hypoglycemic drugs: Secondary | ICD-10-CM

## 2023-08-05 LAB — BASIC METABOLIC PANEL
BUN: 26 mg/dL — ABNORMAL HIGH (ref 6–23)
CO2: 26 meq/L (ref 19–32)
Calcium: 9.7 mg/dL (ref 8.4–10.5)
Chloride: 99 meq/L (ref 96–112)
Creatinine, Ser: 1.41 mg/dL (ref 0.40–1.50)
GFR: 45.41 mL/min — ABNORMAL LOW (ref >60.00)
Glucose, Bld: 143 mg/dL — ABNORMAL HIGH (ref 70–99)
Potassium: 5 meq/L (ref 3.5–5.1)
Sodium: 137 meq/L (ref 135–145)

## 2023-08-05 LAB — POCT GLYCOSYLATED HEMOGLOBIN (HGB A1C)
HbA1c POC (<> result, manual entry): 6 % (ref 4.0–5.6)
HbA1c, POC (controlled diabetic range): 6 % (ref 0.0–7.0)
HbA1c, POC (prediabetic range): 6 % (ref 5.7–6.4)
Hemoglobin A1C: 6 % — AB (ref 4.0–5.6)

## 2023-08-05 MED ORDER — TRIAMCINOLONE ACETONIDE 40 MG/ML IJ SUSP
40.0000 mg | Freq: Once | INTRAMUSCULAR | Status: AC
  Administered 2023-08-05: 40 mg via INTRAMUSCULAR

## 2023-08-05 NOTE — Progress Notes (Signed)
OFFICE VISIT  08/05/2023  CC:  Chief Complaint  Patient presents with   Medical Management of Chronic Issues    Patient is a 87 y.o. male who presents for 3 mo f/u DM and f/u right shoulder pain. I last saw him 2 weeks ago. A/P as of that visit: "1 orthostatic hypotension in the setting of chronic hypertension. Discussed the necessity of some permissive hypertension at this time. His current regimen is one half of the Lopressor 25 mg tab twice a day, losartan 25 mg a day, and hydralazine 25 mg on a as needed basis for systolic blood pressure greater than 170 or diastolic blood pressure greater than 100.   #2 right shoulder rotator cuff tendinosis with subacromial/subdeltoid bursitis. Plan is for steroid injection at next follow-up on 08/05/2023."  INTERIM HX: Glucoses at home normal. Orthostatic hypotension unchanged, says he needs a walker with seat.  R shoulder pain unchanged.  Several weeks of right big toe pain at the medial aspect of the nail.  No redness or swelling or drainage.  ROS as above, plus--> no fevers, no CP, no SOB, no wheezing, no cough, no HAs, no rashes, no melena/hematochezia.  No polyuria or polydipsia.  No myalgias or arthralgias other than right shoulder pain  No focal weakness, paresthesias, or tremors.  No acute vision or hearing abnormalities.  No dysuria or unusual/new urinary urgency or frequency.  No recent changes in lower legs. No n/v/d or abd pain.  No palpitations.    Past Medical History:  Diagnosis Date   Anxiety and depression    Ascending aortic aneurysm (HCC) 01/22/2022   CAD in native artery 01/22/2022   Chronic renal insufficiency, stage 3 (moderate) (HCC)    Diabetes mellitus without complication (HCC)    Gait instability 01/22/2022   GERD (gastroesophageal reflux disease)    Gout    Hypercholesterolemia    Hypertension    Lumbar compression fracture (HCC)    L2, 20%, chronic (imaging 2024).  DEXA -1.0 04/2023   Orthostatic  hypotension    OSA (obstructive sleep apnea)    Osteoarthritis of left hip    + troch burs   Prostate cancer (HCC)    1990-->prostatectomy, released from urol x many years   Urinary incontinence     Past Surgical History:  Procedure Laterality Date   CHOLECYSTECTOMY, LAPAROSCOPIC     1992   DEXA     T score -1.0 Sept 2024   ROBOT ASSISTED LAPAROSCOPIC RADICAL PROSTATECTOMY     1999   ROTATOR CUFF REPAIR     Left 1991, right 1997   TOTAL KNEE ARTHROPLASTY     bilat 2004   TRANSTHORACIC ECHOCARDIOGRAM     11/24/22, EF nl, +LVH with no WMA, grd I DD, valves ok (no amyloid changes)    Outpatient Medications Prior to Visit  Medication Sig Dispense Refill   allopurinol (ZYLOPRIM) 300 MG tablet Take 1 tablet (300 mg total) by mouth daily. 90 tablet 1   aspirin EC 81 MG tablet Take 1 tablet every day by oral route.     atorvastatin (LIPITOR) 40 MG tablet Take 1 tablet (40 mg total) by mouth daily. 90 tablet 1   Continuous Glucose Receiver (FREESTYLE LIBRE 3 READER) DEVI Inject 1 each into the skin every 14 (fourteen) days. 2 each 2   Continuous Glucose Sensor (FREESTYLE LIBRE 3 SENSOR) MISC Inject 1 each into the skin every 14 (fourteen) days. 2 each 2   fluticasone (FLONASE) 50 MCG/ACT nasal spray  Place 1-2 sprays into both nostrils daily. 16 g 6   glimepiride (AMARYL) 1 MG tablet Take 1 tablet (1 mg total) by mouth daily with breakfast. 90 tablet 1   glucose blood test strip E11.9 Use to test blood sugar twice daily or as needed 100 each 12   hydrALAZINE (APRESOLINE) 25 MG tablet 1 tab p.o. as needed for blood pressure greater than 170 systolic or greater than 100 diastolic 30 tablet 0   ipratropium (ATROVENT) 0.03 % nasal spray PLACE 2 SPRAYS INTO BOTH NOSTRILS EVERY 12 (TWELVE) HOURS. USE 15 TO 30 MIN PRIOR TO BREAKFAST AND SUPPER 30 mL 6   losartan (COZAAR) 25 MG tablet Take 1 tablet (25 mg total) by mouth at bedtime. 90 tablet 2   metFORMIN (GLUCOPHAGE) 500 MG tablet TAKE 2 TABLETS  (1,000 MG TOTAL) BY MOUTH 2 (TWO) TIMES DAILY WITH A MEAL. 360 tablet 0   metoprolol tartrate (LOPRESSOR) 25 MG tablet TAKE 1 TABLET BY MOUTH TWICE A DAY 180 tablet 3   midodrine (PROAMATINE) 10 MG tablet TAKE 1 TABLET BEFORE GETTING OUT OF BED OR SOON AFTER, 1 TABLET AT 12PM, 1 TABLET AROUND 4PM (Patient taking differently: TAKE 1 TABLET BEFORE GETTING OUT OF BED OR SOON AFTER, 1 TABLET AT 12PM.) 270 tablet 2   Multiple Vitamins-Minerals (ICAPS AREDS 2 PO) TAKE 1 CAPSULE BY MOUTH TWICE A DAY FOR MACULAR DEGENERATION     omeprazole (PRILOSEC) 20 MG capsule Take 1 capsule (20 mg total) by mouth daily. 90 capsule 1   pioglitazone (ACTOS) 15 MG tablet Take 1 tablet (15 mg total) by mouth daily. 90 tablet 1   tirzepatide (MOUNJARO) 5 MG/0.5ML Pen Inject 5 mg into the skin once a week. 6 mL 3   venlafaxine (EFFEXOR) 75 MG tablet Take 1 tablet (75 mg total) by mouth daily. 90 tablet 1   b complex vitamins capsule Take 1 capsule by mouth daily.     No facility-administered medications prior to visit.    Allergies  Allergen Reactions   Lisinopril Cough, Swelling and Other (See Comments)    Review of Systems As per HPI  PE:    08/05/2023   10:38 AM 07/22/2023   11:14 AM 07/22/2023   10:27 AM  Vitals with BMI  Weight 252 lbs 3 oz  254 lbs 6 oz  Systolic 144 154 782  Diastolic 82 88 93  Pulse 80  86     Physical Exam  Gen: Alert, well appearing.  Patient is oriented to person, place, time, and situation. AFFECT: pleasant, lucid thought and speech. Right big toe with mild tenderness to palpation at the medial nail border.  No erythema or swelling.  The nail appears normal.  LABS:  Last CBC Lab Results  Component Value Date   WBC 7.9 05/06/2023   HGB 13.5 05/06/2023   HCT 42.0 05/06/2023   MCV 90.3 05/06/2023   MCH 29.0 05/06/2023   RDW 13.8 05/06/2023   PLT 276 05/06/2023   Last metabolic panel Lab Results  Component Value Date   GLUCOSE 207 (H) 05/06/2023   NA 138  05/06/2023   K 5.3 05/06/2023   CL 101 05/06/2023   CO2 26 05/06/2023   BUN 22 05/06/2023   CREATININE 1.43 (H) 05/06/2023   GFR 51.19 (L) 01/26/2023   CALCIUM 9.6 05/06/2023   PROT 6.9 05/06/2023   ALBUMIN 4.1 01/26/2023   BILITOT 0.5 05/06/2023   ALKPHOS 73 01/26/2023   AST 24 05/06/2023   ALT 25  05/06/2023   Last lipids Lab Results  Component Value Date   CHOL 112 05/06/2023   HDL 41 05/06/2023   LDLCALC 48 05/06/2023   TRIG 146 05/06/2023   CHOLHDL 2.7 05/06/2023   Last hemoglobin A1c Lab Results  Component Value Date   HGBA1C 6.0 (A) 08/05/2023   HGBA1C 6.0 08/05/2023   HGBA1C 6.0 08/05/2023   HGBA1C 6.0 08/05/2023   Last thyroid functions Lab Results  Component Value Date   TSH 3.410 04/12/2022   T4TOTAL 7.1 04/12/2022   IMPRESSION AND PLAN:  #1 diabetes with nephropathy, good control on pioglitazone 15 mg a day, metformin 1000 mg twice a day, Mounjaro 5 mg weekly, and glimepiride 1 mg daily. POC Hba1c today is 6%. Monitor renal function today.  2.  Right big toe ingrown nail. Discussed warm water Epsom salt soaks 20 minutes daily. Discussed correct way to trim nail.  3.  Orthostatic hypotension, gait instability and recurrent falls. Prescription for rolling walker with a seat was given today. His current regimen is one half of the Lopressor 25 mg tab twice a day, losartan 25 mg a day, and hydralazine 25 mg on a as needed basis for systolic blood pressure greater than 170 or diastolic blood pressure greater than 100. He has midodrine 10 mg that he can take 3 times a day as needed.  He is aware of the need to avoid supine position for at least a couple of hours after taking the medication.  4.  Progressive right shoulder pain. Physical exam and bedside ultrasound exam consistent with rotator cuff tendinosis with impingement. Discussed the option of PT but he declined because of his significant orthostatic hypotension. He opted for steroid injection  today.  Ultrasound-guided injection is preferred based on studies that show increased duration, increased effect, greater accuracy, decreased procedural pain, increased response rate, and decreased cost with ultrasound-guided versus blind injection. Procedure: Real-time ultrasound guided injection of right subacromial/subdeltoid bursa. Device: GE [employer] informed consent obtained.  Timeout conducted.  No overlying erythema, induration, or other signs of local infection. After sterile prep with Betadine, injected mixture of 40 mg Depo-Medrol and 3 mL of 1% plain lidocaine.  Injectate seen filling subacromial bursa.. Patient tolerated the procedure well.  No immediate complications.  Post-injection care discussed. Advised to call if fever/chills, erythema, drainage, or persistent bleeding.  Impression: Technically successful ultrasound-guided injection.  An After Visit Summary was printed and given to the patient.  FOLLOW UP: Return in about 3 months (around 11/03/2023) for routine chronic illness f/u.  Signed:  Santiago Bumpers, MD           08/05/2023

## 2023-08-29 ENCOUNTER — Other Ambulatory Visit: Payer: Medicare HMO

## 2023-08-29 ENCOUNTER — Telehealth: Payer: Self-pay

## 2023-08-29 DIAGNOSIS — M8000XA Age-related osteoporosis with current pathological fracture, unspecified site, initial encounter for fracture: Secondary | ICD-10-CM

## 2023-08-29 NOTE — Telephone Encounter (Signed)
 Orders Placed This Encounter  Procedures   Vitamin D 1,25 dihydroxy   Renal function panel   PTH, intact and calcium   Magnesium   VITAMIN D 25 Hydroxy (Vit-D Deficiency, Fractures)

## 2023-09-02 ENCOUNTER — Ambulatory Visit: Payer: Medicare HMO | Admitting: "Endocrinology

## 2023-09-11 ENCOUNTER — Other Ambulatory Visit: Payer: Self-pay | Admitting: Family Medicine

## 2023-09-11 DIAGNOSIS — E119 Type 2 diabetes mellitus without complications: Secondary | ICD-10-CM

## 2023-09-21 ENCOUNTER — Encounter: Payer: Self-pay | Admitting: Family Medicine

## 2023-09-21 ENCOUNTER — Ambulatory Visit: Payer: Medicare HMO | Admitting: Family Medicine

## 2023-09-21 VITALS — BP 125/72 | HR 77 | Temp 98.0°F | Wt 247.6 lb

## 2023-09-21 DIAGNOSIS — M79652 Pain in left thigh: Secondary | ICD-10-CM

## 2023-09-21 DIAGNOSIS — M79605 Pain in left leg: Secondary | ICD-10-CM

## 2023-09-21 LAB — D-DIMER, QUANTITATIVE: D-Dimer, Quant: 0.48 ug{FEU}/mL (ref <0.50)

## 2023-09-21 NOTE — Progress Notes (Signed)
 OFFICE VISIT  09/21/2023  CC:  Chief Complaint  Patient presents with   Leg Pain    Left leg pain ongoing for 1 week; concerned of DVT    Patient is a 88 y.o. male who presents for left leg pain.  HPI: About 1 week ago he developed pain across the top of the left thigh.  It only hurts when he stands and walks.  No other leg pain.  No leg swelling.  No redness or tenderness of the skin. No history of thrombosis. No recent surgical procedure, prolonged immobilization, or trauma.  No recent change in activity level.  Review of systems: No shortness of breath, no chest pain, no fever, no hip or low back pain.  No knee pain. No leg weakness or paresthesias.  Past Medical History:  Diagnosis Date   Anxiety and depression    Ascending aortic aneurysm (HCC) 01/22/2022   CAD in native artery 01/22/2022   Chronic renal insufficiency, stage 3 (moderate) (HCC)    Diabetes mellitus without complication (HCC)    Gait instability 01/22/2022   GERD (gastroesophageal reflux disease)    Gout    Hypercholesterolemia    Hypertension    Lumbar compression fracture (HCC)    L2, 20%, chronic (imaging 2024).  DEXA -1.0 04/2023   Orthostatic hypotension    OSA (obstructive sleep apnea)    Osteoarthritis of left hip    + troch burs   Prostate cancer (HCC)    1990-->prostatectomy, released from urol x many years   Urinary incontinence     Past Surgical History:  Procedure Laterality Date   CHOLECYSTECTOMY, LAPAROSCOPIC     1992   DEXA     T score -1.0 Sept 2024   ROBOT ASSISTED LAPAROSCOPIC RADICAL PROSTATECTOMY     1999   ROTATOR CUFF REPAIR     Left 1991, right 1997   TOTAL KNEE ARTHROPLASTY     bilat 2004   TRANSTHORACIC ECHOCARDIOGRAM     11/24/22, EF nl, +LVH with no WMA, grd I DD, valves ok (no amyloid changes)    Outpatient Medications Prior to Visit  Medication Sig Dispense Refill   allopurinol  (ZYLOPRIM ) 300 MG tablet Take 1 tablet (300 mg total) by mouth daily. 90 tablet 1    aspirin EC 81 MG tablet Take 1 tablet every day by oral route.     atorvastatin  (LIPITOR) 40 MG tablet Take 1 tablet (40 mg total) by mouth daily. 90 tablet 1   Continuous Glucose Sensor (FREESTYLE LIBRE 3 PLUS SENSOR) MISC INJECT 1 EACH INTO THE SKIN EVERY 14 (FOURTEEN) DAYS. 2 each 2   fluticasone  (FLONASE ) 50 MCG/ACT nasal spray Place 1-2 sprays into both nostrils daily. 16 g 6   glimepiride  (AMARYL ) 1 MG tablet Take 1 tablet (1 mg total) by mouth daily with breakfast. 90 tablet 1   hydrALAZINE  (APRESOLINE ) 25 MG tablet 1 tab p.o. as needed for blood pressure greater than 170 systolic or greater than 100 diastolic 30 tablet 0   ipratropium (ATROVENT ) 0.03 % nasal spray PLACE 2 SPRAYS INTO BOTH NOSTRILS EVERY 12 (TWELVE) HOURS. USE 15 TO 30 MIN PRIOR TO BREAKFAST AND SUPPER 30 mL 6   losartan  (COZAAR ) 25 MG tablet Take 1 tablet (25 mg total) by mouth at bedtime. 90 tablet 2   metFORMIN  (GLUCOPHAGE ) 500 MG tablet TAKE 2 TABLETS (1,000 MG TOTAL) BY MOUTH 2 (TWO) TIMES DAILY WITH A MEAL. (Patient taking differently: Take 500 mg by mouth 2 (two) times daily with  a meal. TAKE 2 TABLETS (1,000 MG TOTAL) BY MOUTH 2 (TWO) TIMES DAILY WITH A MEAL.) 360 tablet 0   metoprolol  tartrate (LOPRESSOR ) 25 MG tablet TAKE 1 TABLET BY MOUTH TWICE A DAY (Patient taking differently: Take 12.5 mg by mouth 2 (two) times daily.) 180 tablet 3   midodrine  (PROAMATINE ) 10 MG tablet TAKE 1 TABLET BEFORE GETTING OUT OF BED OR SOON AFTER, 1 TABLET AT 12PM, 1 TABLET AROUND 4PM (Patient taking differently: TAKE 1 TABLET BEFORE GETTING OUT OF BED OR SOON AFTER, 1 TABLET AT 12PM.) 270 tablet 2   Multiple Vitamins-Minerals (ICAPS AREDS 2 PO) TAKE 1 CAPSULE BY MOUTH TWICE A DAY FOR MACULAR DEGENERATION     omeprazole  (PRILOSEC) 20 MG capsule Take 1 capsule (20 mg total) by mouth daily. 90 capsule 1   pioglitazone  (ACTOS ) 15 MG tablet Take 1 tablet (15 mg total) by mouth daily. 90 tablet 1   tirzepatide  (MOUNJARO ) 5 MG/0.5ML Pen Inject 5  mg into the skin once a week. 6 mL 3   venlafaxine  (EFFEXOR ) 75 MG tablet Take 1 tablet (75 mg total) by mouth daily. 90 tablet 1   Continuous Glucose Receiver (FREESTYLE LIBRE 3 READER) DEVI Inject 1 each into the skin every 14 (fourteen) days. (Patient not taking: Reported on 09/21/2023) 2 each 2   glucose blood test strip E11.9 Use to test blood sugar twice daily or as needed (Patient not taking: Reported on 09/21/2023) 100 each 12   No facility-administered medications prior to visit.    Allergies  Allergen Reactions   Lisinopril Cough, Swelling and Other (See Comments)    Review of Systems  As per HPI  PE:    09/21/2023   11:07 AM 08/05/2023   10:38 AM 07/22/2023   11:14 AM  Vitals with BMI  Weight 247 lbs 10 oz 252 lbs 3 oz   Systolic 125 144 845  Diastolic 72 82 88  Pulse 77 80      Physical Exam  Gen: Alert, well appearing.  Patient is oriented to person, place, time, and situation. AFFECT: pleasant, lucid thought and speech. Left thigh without tenderness to palpation.  No erythema, induration, or nodularity is palpable. No pain with resisted knee extension or flexion. He has approximately 2+ pitting edema in both lower legs.  Left leg circumference measured at 10 cm below the inferior border of the patella is 40.5 cm.  Right leg 40 cm.  No calf tenderness.  LABS:  Last CBC Lab Results  Component Value Date   WBC 7.9 05/06/2023   HGB 13.5 05/06/2023   HCT 42.0 05/06/2023   MCV 90.3 05/06/2023   MCH 29.0 05/06/2023   RDW 13.8 05/06/2023   PLT 276 05/06/2023   Last metabolic panel Lab Results  Component Value Date   GLUCOSE 143 (H) 08/05/2023   NA 137 08/05/2023   K 5.0 08/05/2023   CL 99 08/05/2023   CO2 26 08/05/2023   BUN 26 (H) 08/05/2023   CREATININE 1.41 08/05/2023   GFR 45.41 (L) 08/05/2023   CALCIUM  9.7 08/05/2023   PROT 6.9 05/06/2023   ALBUMIN 4.1 01/26/2023   BILITOT 0.5 05/06/2023   ALKPHOS 73 01/26/2023   AST 24 05/06/2023   ALT 25  05/06/2023   Last hemoglobin A1c Lab Results  Component Value Date   HGBA1C 6.0 (A) 08/05/2023   HGBA1C 6.0 08/05/2023   HGBA1C 6.0 08/05/2023   HGBA1C 6.0 08/05/2023   IMPRESSION AND PLAN:  #1 acute left thigh pain.  Unknown etiology but it seems muscular in nature. Low suspicion of thrombosis. Will check D-dimer and if positive we will move forward with lower extremity venous Doppler ultrasound.  An After Visit Summary was printed and given to the patient.  FOLLOW UP: No follow-ups on file.  Signed:  Gerlene Hockey, MD           09/21/2023

## 2023-10-06 ENCOUNTER — Other Ambulatory Visit: Payer: Self-pay | Admitting: Family Medicine

## 2023-10-06 DIAGNOSIS — F411 Generalized anxiety disorder: Secondary | ICD-10-CM

## 2023-10-08 ENCOUNTER — Other Ambulatory Visit: Payer: Self-pay | Admitting: Family Medicine

## 2023-10-08 DIAGNOSIS — F411 Generalized anxiety disorder: Secondary | ICD-10-CM

## 2023-10-10 ENCOUNTER — Other Ambulatory Visit: Payer: Medicare HMO

## 2023-10-11 ENCOUNTER — Ambulatory Visit (INDEPENDENT_AMBULATORY_CARE_PROVIDER_SITE_OTHER): Payer: Medicare HMO | Admitting: Urgent Care

## 2023-10-11 ENCOUNTER — Encounter: Payer: Self-pay | Admitting: Urgent Care

## 2023-10-11 VITALS — BP 165/91 | HR 88 | Wt 233.0 lb

## 2023-10-11 DIAGNOSIS — E1169 Type 2 diabetes mellitus with other specified complication: Secondary | ICD-10-CM | POA: Diagnosis not present

## 2023-10-11 DIAGNOSIS — G5712 Meralgia paresthetica, left lower limb: Secondary | ICD-10-CM

## 2023-10-11 DIAGNOSIS — M7062 Trochanteric bursitis, left hip: Secondary | ICD-10-CM | POA: Diagnosis not present

## 2023-10-11 DIAGNOSIS — E669 Obesity, unspecified: Secondary | ICD-10-CM | POA: Diagnosis not present

## 2023-10-11 DIAGNOSIS — I951 Orthostatic hypotension: Secondary | ICD-10-CM | POA: Diagnosis not present

## 2023-10-11 MED ORDER — PREDNISONE 20 MG PO TABS: 20.0000 mg | ORAL_TABLET | Freq: Every day | ORAL | 0 refills | Status: DC

## 2023-10-11 NOTE — Patient Instructions (Addendum)
 Your symptoms seem most consistent with greater trochanteric bursitis I have referred you to sports medicine for a futher assessment.  Please take prednisone once daily with breakfast. MUST monitor your glucose levels while on this medication.  DO NOT continue laying on your left side. This will continue to cause the pain. Purchase a [employer].  Please read the attached handouts and perform the rehab exercises listed for both meralgia paresthetica and bursitis.  DRINK MORE WATER!! This will help your light-headedness upon standing. Please try to consume 2-3L daily. This will also help with your chronic kidney disease.

## 2023-10-11 NOTE — Progress Notes (Signed)
 Established Patient Office Visit  Subjective:  Patient ID: Jason [name], male    DOB: 1935/09/30  Age: 88 y.o. MRN: [id number]  Chief Complaint  Patient presents with   Leg Pain    Pt has been having left leg pain for a few weeks now and it seems to be worse in the mornings.     Leg Pain     Discussed the use of AI scribe software for clinical note transcription with the patient, who gave verbal consent to proceed.  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  He has been experiencing persistent sharp pain in the left thigh for approximately three to four weeks, localized to the top and outside of the thigh, without radiation to the hip or knee. The pain is most severe upon standing in the morning, making it difficult for him to stand. Tylenol provides some relief but does not completely resolve the pain. No associated weakness, swelling, or redness, and no recent trauma or injury to the area. A D-dimer test conducted a couple of weeks ago was negative, ruling out deep vein thrombosis. He has not had an ultrasound. He has a history of a compression fracture at L2, identified in an x-ray from August of the previous year, and has not followed up with a spine physician since then. Additionally, pt states that he sleeps on his left side, which seems to make his pain worse.  He has diabetes and notes that his blood sugar tends to spike after consuming certain cereals, such as Special K with bananas. He is currently taking medication for diabetes and hypertension. His blood sugar is high at present, which he attributes to his recent breakfast choice.  He experiences orthostatic hypotension, noting that his blood pressure drops upon standing. He consumes only about sixteen ounces of water per day.      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   Obstructive sleep apnea of adult 08/11/2022   Diabetic nephropathy with proteinuria (HCC) 08/11/2022   Obesity 08/11/2022   Hyperlipidemia 08/11/2022   Stage 3a chronic kidney disease (HCC) 08/11/2022   Anxiety disorder, unspecified 08/11/2022   Diabetes mellitus (HCC) 08/11/2022   Primary malignant neoplasm of prostate (HCC) 08/02/2022   Carcinoma of prostate (HCC) 08/02/2022   Shortness of breath 02/22/2022   Pure hypercholesterolemia 02/22/2022   Pain in joint, shoulder region 02/22/2022   Other malaise and fatigue 02/22/2022   Nonexudative age-related macular degeneration, bilateral, intermediate dry stage 02/22/2022   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   Gastroesophageal reflux disease 02/22/2022   Benign paroxysmal positional vertigo 02/22/2022   CAD in native artery 01/22/2022   Thoracic aortic aneurysm, without rupture, unspecified (HCC) 01/22/2022   Gait instability 01/22/2022   Essential hypertension 01/17/2022   Type 2 diabetes mellitus without complications (HCC) 01/17/2022   HLD (hyperlipidemia) 01/17/2022   Obstructive sleep apnea 01/17/2022   Sensorineural hearing loss, bilateral 11/09/2021   Orthostatic hypotension 11/09/2021   Imbalance 11/09/2021   Overweight 02/21/2020   First degree heart block 02/21/2020   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   Malignant tumor of prostate (HCC) 02/21/2020   Vitamin D deficiency 05/31/2016   History of malignant neoplasm of prostate 05/31/2016   History of malignant neoplasm of skin 05/31/2016   Bilateral hearing loss 05/31/2016   Anxiety state 05/31/2016  Bronchospasm 09/24/2010   Cough 09/19/2010   Acute bronchitis 09/19/2010   Allergic rhinitis due to pollen 09/19/2010   Acute sinusitis 09/19/2010   Past Medical History:  Diagnosis Date   Anxiety and depression    Ascending aortic aneurysm (HCC) 01/22/2022   CAD in native artery 01/22/2022   Chronic renal insufficiency, stage 3 (moderate)  (HCC)    Diabetes mellitus without complication (HCC)    Gait instability 01/22/2022   GERD (gastroesophageal reflux disease)    Gout    Hypercholesterolemia    Hypertension    Lumbar compression fracture (HCC)    L2, 20%, chronic (imaging 2024).  DEXA -1.0 04/2023   Orthostatic hypotension    OSA (obstructive sleep apnea)    Osteoarthritis of left hip    + troch burs   Prostate cancer (HCC)    1990-->prostatectomy, released from urol x many years   Urinary incontinence    Past Surgical History:  Procedure Laterality Date   CHOLECYSTECTOMY, LAPAROSCOPIC     1992   DEXA     T score -1.0 Sept 2024   ROBOT ASSISTED LAPAROSCOPIC RADICAL PROSTATECTOMY     1999   ROTATOR CUFF REPAIR     Left 1991, right 1997   TOTAL KNEE ARTHROPLASTY     bilat 2004   TRANSTHORACIC ECHOCARDIOGRAM     11/24/22, EF nl, +LVH with no WMA, grd I DD, valves ok (no amyloid changes)   Social History   Tobacco Use   Smoking status: Former    Types: Pipe   Smokeless tobacco: Never  Vaping Use   Vaping status: Never Used  Substance Use Topics   Alcohol use: Yes    Alcohol/week: 2.0 standard drinks of alcohol    Types: 2 Glasses of wine per week   Drug use: Never      ROS: as noted in HPI  Objective:     BP (!) 165/91   Pulse 88   Wt 233 lb (105.7 kg)   SpO2 97%   BMI 32.50 kg/m  BP Readings from Last 3 Encounters:  10/11/23 (!) 165/91  09/21/23 125/72  08/05/23 (!) 144/82   Wt Readings from Last 3 Encounters:  10/11/23 233 lb (105.7 kg)  09/21/23 247 lb 9.6 oz (112.3 kg)  08/05/23 252 lb 3.2 oz (114.4 kg)      Physical Exam Vitals and nursing note reviewed.  Constitutional:      General: He is not in acute distress.    Appearance: Normal appearance. He is obese. He is not ill-appearing, toxic-appearing or diaphoretic.  Cardiovascular:     Rate and Rhythm: Normal rate.  Pulmonary:     Effort: Pulmonary effort is normal. No respiratory distress.  Musculoskeletal:     Left  hip: Normal. No tenderness, bony tenderness or crepitus. Normal range of motion.     Right upper leg: Normal. No swelling, tenderness or bony tenderness.     Left upper leg: Tenderness and bony tenderness present. No swelling.     Right knee: Swelling (chronic) present.     Left knee: Swelling (chronic) present.       Legs:  Neurological:     Mental Status: He is alert.     Motor: No weakness.     Gait: Gait abnormal (slow, deliberate gait with cane).      No results found for any visits on 10/11/23.  Last CBC Lab Results  Component Value Date   WBC 7.9 05/06/2023   HGB 13.5 05/06/2023  HCT 42.0 05/06/2023   MCV 90.3 05/06/2023   MCH 29.0 05/06/2023   RDW 13.8 05/06/2023   PLT 276 05/06/2023   Last metabolic panel Lab Results  Component Value Date   GLUCOSE 143 (H) 08/05/2023   NA 137 08/05/2023   K 5.0 08/05/2023   CL 99 08/05/2023   CO2 26 08/05/2023   BUN 26 (H) 08/05/2023   CREATININE 1.41 08/05/2023   GFR 45.41 (L) 08/05/2023   CALCIUM 9.7 08/05/2023   PROT 6.9 05/06/2023   ALBUMIN 4.1 01/26/2023   BILITOT 0.5 05/06/2023   ALKPHOS 73 01/26/2023   AST 24 05/06/2023   ALT 25 05/06/2023      The ASCVD Risk score (Arnett DK, et al., 2019) failed to calculate for the following reasons:   The 2019 ASCVD risk score is only valid for ages 77 to 17  Assessment & Plan:  Trochanteric bursitis of left hip -     Ambulatory referral to Sports Medicine -     predniSONE; Take 1 tablet (20 mg total) by mouth daily with breakfast.  Dispense: 5 tablet; Refill: 0  Meralgia paresthetica of left side  Orthostatic hypotension  Type 2 diabetes mellitus with obesity (HCC)  Assessment and Plan    Left Thigh Pain Severe, sharp pain on the lateral aspect of the left thigh, worse in the morning. No associated weakness, but some numbness/ tingling. No trauma or injury. Pain is possibly due to trochanteric bursitis and meralgia paresthetica. -Start Prednisone 20mg  daily with  breakfast, monitor blood sugar closely due to diabetes. (Pt has taken in the past without significant glycemic spikes) -Perform home exercises and stretches for meralgia paresthetica and bursitis. -Consultation with sports medicine for possible injection if pain persists. -Avoid sleeping on the left side to reduce pressure on the affected area. -Purchase a Corporate treasurer for self-massage.  Orthostatic Hypotension Reports of blood pressure dropping upon standing. Likely due to inadequate fluid intake. -Increase water intake to approximately 64 ounces per day.  Diabetes Blood sugar elevated after breakfast, possibly due to cereal intake. Recent weight loss noted, desired. -Continue monitoring blood sugar closely, especially when starting Prednisone. -Consider dietary modifications to include more protein and less carbohydrates in breakfast.  General Health Maintenance -Continue weight loss efforts with current regimen.         No follow-ups on file.   Maretta Bees, PA

## 2023-10-13 ENCOUNTER — Ambulatory Visit: Payer: Medicare HMO | Admitting: "Endocrinology

## 2023-10-15 ENCOUNTER — Other Ambulatory Visit: Payer: Self-pay | Admitting: Family Medicine

## 2023-10-15 DIAGNOSIS — E1169 Type 2 diabetes mellitus with other specified complication: Secondary | ICD-10-CM

## 2023-10-17 ENCOUNTER — Telehealth: Payer: Self-pay | Admitting: Family Medicine

## 2023-10-17 DIAGNOSIS — E1169 Type 2 diabetes mellitus with other specified complication: Secondary | ICD-10-CM

## 2023-10-17 LAB — BASIC METABOLIC PANEL
BUN: 25 — AB (ref 4–21)
CO2: 27 — AB (ref 13–22)
Chloride: 100 (ref 99–108)
Creatinine: 1.5 — AB (ref 0.6–1.3)
Glucose: 193
Potassium: 4.2 meq/L (ref 3.5–5.1)
Sodium: 138 (ref 137–147)

## 2023-10-17 LAB — CBC: RBC: 4.89 (ref 3.87–5.11)

## 2023-10-17 LAB — COMPREHENSIVE METABOLIC PANEL
Albumin: 4.5 (ref 3.5–5.0)
Calcium: 10 (ref 8.7–10.7)
eGFR: 46

## 2023-10-17 LAB — CBC AND DIFFERENTIAL
HCT: 45 (ref 41–53)
Hemoglobin: 14.6 (ref 13.5–17.5)
Neutrophils Absolute: 5.79
WBC: 12.5

## 2023-10-17 LAB — TSH: TSH: 2.12 (ref 0.41–5.90)

## 2023-10-17 LAB — MICROALBUMIN / CREATININE URINE RATIO: Microalb Creat Ratio: 655

## 2023-10-17 LAB — LIPID PANEL
Cholesterol: 157 (ref 0–200)
HDL: 55 (ref 35–70)
LDL Cholesterol: 74
Triglycerides: 142 (ref 40–160)

## 2023-10-17 LAB — PROTEIN / CREATININE RATIO, URINE
Albumin, U: 81.9
Creatinine, Urine: 125

## 2023-10-17 LAB — HEPATIC FUNCTION PANEL
ALT: 34 U/L (ref 10–40)
AST: 21 (ref 14–40)
Alkaline Phosphatase: 94 (ref 25–125)
Bilirubin, Direct: 0.1
Bilirubin, Total: 0.4

## 2023-10-17 LAB — HEMOGLOBIN A1C: Hemoglobin A1C: 6.1

## 2023-10-17 LAB — VITAMIN D 25 HYDROXY (VIT D DEFICIENCY, FRACTURES): Vit D, 25-Hydroxy: 76

## 2023-10-17 NOTE — Telephone Encounter (Signed)
 I am no longer PCP for this patient, will forward to Dr. Milinda Cave. Thanks.

## 2023-10-17 NOTE — Telephone Encounter (Signed)
 I attempted to authorize pioglitazone rf for pt to [facility] but it did not go through. Will you pls call them and authorize this over the phone? Pioglitazone 15mg , 1 tab po every day, #90, RF x 1.

## 2023-10-18 ENCOUNTER — Other Ambulatory Visit: Payer: Self-pay | Admitting: Family Medicine

## 2023-10-18 DIAGNOSIS — E785 Hyperlipidemia, unspecified: Secondary | ICD-10-CM

## 2023-10-18 MED ORDER — PIOGLITAZONE HCL 15 MG PO TABS
15.0000 mg | ORAL_TABLET | Freq: Every day | ORAL | 1 refills | Status: DC
Start: 2023-10-18 — End: 2024-04-06

## 2023-10-18 NOTE — Telephone Encounter (Signed)
 Rx resent electronically for pt.

## 2023-10-19 ENCOUNTER — Ambulatory Visit: Payer: Medicare HMO | Admitting: Dermatology

## 2023-10-31 ENCOUNTER — Other Ambulatory Visit (INDEPENDENT_AMBULATORY_CARE_PROVIDER_SITE_OTHER): Payer: Self-pay

## 2023-10-31 ENCOUNTER — Encounter: Payer: Self-pay | Admitting: Orthopedic Surgery

## 2023-10-31 ENCOUNTER — Ambulatory Visit: Admitting: Orthopedic Surgery

## 2023-10-31 DIAGNOSIS — M5416 Radiculopathy, lumbar region: Secondary | ICD-10-CM

## 2023-10-31 DIAGNOSIS — M79605 Pain in left leg: Secondary | ICD-10-CM

## 2023-10-31 NOTE — Progress Notes (Signed)
 Office Visit Note   Patient: Jason [name]           Date of Birth: 1936/07/14           MRN: [id number] Visit Date: 10/31/2023              Requested by: Jeoffrey Massed, MD [address] [place],  [place] [address] PCP: Jeoffrey Massed, MD  Chief Complaint  Patient presents with   Left Leg - Pain      HPI: Patient is an 88 year old gentleman with left-sided radicular pain that radiates from the thigh down the lateral aspect.  Patient denies any groin pain denies any weakness.  Patient has completed a course of prednisone without relief.  Assessment & Plan: Visit Diagnoses:  1. Pain in left leg   2. Lumbar back pain with radiculopathy affecting left lower extremity     Plan: With failure of conservative treatment we will plan for an MRI scan of the lumbar spine.  Anticipate patient may benefit from a epidural steroid injection.  Plan to follow-up after MRI scan is obtained.  Follow-Up Instructions: Return if symptoms worsen or fail to improve.   Ortho Exam  Patient is alert, oriented, no adenopathy, well-dressed, normal affect, normal respiratory effort. Examination patient has a negative straight leg raise bilaterally.  No pain with range of motion of the hips knees or ankle.  No focal motor weakness in either lower extremity.  Patient ambulates with a cane and uses assistance for standing.  Most recent hemoglobin A1c 6.0.  Patient's diabetes under control with oral medication.  Imaging: XR Lumbar Spine 2-3 Views Result Date: 10/31/2023 2 view radiographs of the lumbar spine shows degenerative disc disease throughout the lumbar spine with joint space collapse and calcification of the aorta with maximum diameter 3 cm.  No images are attached to the encounter.  Labs: Lab Results  Component Value Date   HGBA1C 6.0 (A) 08/05/2023   HGBA1C 6.0 08/05/2023   HGBA1C 6.0 08/05/2023   HGBA1C 6.0 08/05/2023     Lab Results  Component Value Date   ALBUMIN 4.1  01/26/2023   ALBUMIN 4.0 10/25/2022   ALBUMIN 4.1 03/18/2022    No results found for: "MG" No results found for: "VD25OH"  No results found for: "PREALBUMIN"    Latest Ref Rng & Units 05/06/2023   11:09 AM 04/12/2022   11:24 AM 12/10/2021   12:33 PM  CBC EXTENDED  WBC 3.8 - 10.8 Thousand/uL 7.9  8.9  9.5   RBC 4.20 - 5.80 Million/uL 4.65  4.37  4.64   Hemoglobin 13.2 - 17.1 g/dL 46.9  62.9  52.8   HCT 38.5 - 50.0 % 42.0  39.3  42.4   Platelets 140 - 400 Thousand/uL 276  275  212.0 Repeated and verified X2.   NEUT# 1,500 - 7,800 cells/uL 4,116     Lymph# 850 - 3,900 cells/uL 2,670        There is no height or weight on file to calculate BMI.  Orders:  Orders Placed This Encounter  Procedures   XR Lumbar Spine 2-3 Views   MR Lumbar Spine w/o contrast   No orders of the defined types were placed in this encounter.    Procedures: No procedures performed  Clinical Data: No additional findings.  ROS:  All other systems negative, except as noted in the HPI. Review of Systems  Objective: Vital Signs: There were no vitals taken for this visit.  Specialty Comments:  No specialty comments available.  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   Obstructive sleep apnea of adult 08/11/2022   Diabetic nephropathy with proteinuria (HCC) 08/11/2022   Obesity 08/11/2022   Hyperlipidemia 08/11/2022   Stage 3a chronic kidney disease (HCC) 08/11/2022   Anxiety disorder, unspecified 08/11/2022   Diabetes mellitus (HCC) 08/11/2022   Primary malignant neoplasm of prostate (HCC) 08/02/2022   Carcinoma of prostate (HCC) 08/02/2022   Shortness of breath 02/22/2022   Pure hypercholesterolemia 02/22/2022   Pain in joint, shoulder region 02/22/2022   Other malaise and fatigue 02/22/2022   Nonexudative age-related macular degeneration, bilateral, intermediate dry stage  02/22/2022   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   Gastroesophageal reflux disease 02/22/2022   Benign paroxysmal positional vertigo 02/22/2022   CAD in native artery 01/22/2022   Thoracic aortic aneurysm, without rupture, unspecified (HCC) 01/22/2022   Gait instability 01/22/2022   Essential hypertension 01/17/2022   Type 2 diabetes mellitus without complications (HCC) 01/17/2022   HLD (hyperlipidemia) 01/17/2022   Obstructive sleep apnea 01/17/2022   Sensorineural hearing loss, bilateral 11/09/2021   Orthostatic hypotension 11/09/2021   Imbalance 11/09/2021   Overweight 02/21/2020   First degree heart block 02/21/2020   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   Malignant tumor of prostate (HCC) 02/21/2020   Vitamin D deficiency 05/31/2016   History of malignant neoplasm of prostate 05/31/2016   History of malignant neoplasm of skin 05/31/2016   Bilateral hearing loss 05/31/2016   Anxiety state 05/31/2016   Bronchospasm 09/24/2010   Cough 09/19/2010   Acute bronchitis 09/19/2010   Allergic rhinitis due to pollen 09/19/2010   Acute sinusitis 09/19/2010   Past Medical History:  Diagnosis Date   Anxiety and depression    Ascending aortic aneurysm (HCC) 01/22/2022   CAD in native artery 01/22/2022   Chronic renal insufficiency, stage 3 (moderate) (HCC)    Diabetes mellitus without complication (HCC)    Gait instability 01/22/2022   GERD (gastroesophageal reflux disease)    Gout    Hypercholesterolemia    Hypertension    Lumbar compression fracture (HCC)    L2, 20%, chronic (imaging 2024).  DEXA -1.0 04/2023   Orthostatic hypotension    OSA (obstructive sleep apnea)    Osteoarthritis of left hip    + troch burs   Prostate cancer (HCC)    1990-->prostatectomy, released from urol x many years   Urinary incontinence     Family History  Problem Relation Age of Onset   Heart attack Mother    Cancer Father    Cancer Sister     Past Surgical History:  Procedure Laterality Date    CHOLECYSTECTOMY, LAPAROSCOPIC     1992   DEXA     T score -1.0 Sept 2024   ROBOT ASSISTED LAPAROSCOPIC RADICAL PROSTATECTOMY     1999   ROTATOR CUFF REPAIR     Left 1991, right 1997   TOTAL KNEE ARTHROPLASTY     bilat 2004   TRANSTHORACIC ECHOCARDIOGRAM     11/24/22, EF nl, +LVH with no WMA, grd I DD, valves ok (no amyloid changes)   Social History   Occupational History   Not on file  Tobacco Use   Smoking status: Former    Types: Pipe   Smokeless tobacco: Never  Vaping Use   Vaping status: Never Used  Substance and Sexual Activity   Alcohol use:  Yes    Alcohol/week: 2.0 standard drinks of alcohol    Types: 2 Glasses of wine per week   Drug use: Never   Sexual activity: Not Currently

## 2023-11-02 ENCOUNTER — Encounter: Payer: Self-pay | Admitting: Orthopedic Surgery

## 2023-11-03 ENCOUNTER — Encounter: Payer: Self-pay | Admitting: Family Medicine

## 2023-11-03 ENCOUNTER — Ambulatory Visit: Payer: Medicare HMO | Admitting: Family Medicine

## 2023-11-03 VITALS — BP 140/76 | HR 95 | Temp 97.7°F | Ht 71.0 in | Wt 244.6 lb

## 2023-11-03 DIAGNOSIS — I10 Essential (primary) hypertension: Secondary | ICD-10-CM

## 2023-11-03 DIAGNOSIS — M25511 Pain in right shoulder: Secondary | ICD-10-CM

## 2023-11-03 DIAGNOSIS — Z7984 Long term (current) use of oral hypoglycemic drugs: Secondary | ICD-10-CM | POA: Diagnosis not present

## 2023-11-03 DIAGNOSIS — E1121 Type 2 diabetes mellitus with diabetic nephropathy: Secondary | ICD-10-CM

## 2023-11-03 DIAGNOSIS — M25512 Pain in left shoulder: Secondary | ICD-10-CM

## 2023-11-03 DIAGNOSIS — N1831 Chronic kidney disease, stage 3a: Secondary | ICD-10-CM

## 2023-11-03 DIAGNOSIS — Z7985 Long-term (current) use of injectable non-insulin antidiabetic drugs: Secondary | ICD-10-CM

## 2023-11-03 DIAGNOSIS — G8929 Other chronic pain: Secondary | ICD-10-CM | POA: Diagnosis not present

## 2023-11-03 NOTE — Progress Notes (Signed)
 OFFICE VISIT  11/03/2023  CC:  Chief Complaint  Patient presents with   Medical Management of Chronic Issues    Pt is fasting    Patient is a 88 y.o. male who presents for 69-month follow-up diabetes, hypertension, and chronic renal insufficiency stage IIIa. A/P as of last visit: "#1 diabetes with nephropathy, good control on pioglitazone 15 mg a day, metformin 1000 mg twice a day, Mounjaro 5 mg weekly, and glimepiride 1 mg daily. POC Hba1c today is 6%. Monitor renal function today.   2.  Right big toe ingrown nail. Discussed warm water Epsom salt soaks 20 minutes daily. Discussed correct way to trim nail.   3.  Orthostatic hypotension, gait instability and recurrent falls. Prescription for rolling walker with a seat was given today. His current regimen is one half of the Lopressor 25 mg tab twice a day, losartan 25 mg a day, and hydralazine 25 mg on a as needed basis for systolic blood pressure greater than 170 or diastolic blood pressure greater than 100. He has midodrine 10 mg that he can take 3 times a day as needed.  He is aware of the need to avoid supine position for at least a couple of hours after taking the medication.   4.  Progressive right shoulder pain. Physical exam and bedside ultrasound exam consistent with rotator cuff tendinosis with impingement. Discussed the option of PT but he declined because of his significant orthostatic hypotension. He opted for steroid injection today."  INTERIM HX: Feeling well other than some chronic bilateral shoulder pain, right worse than left.  He does say that my injection 3 months ago helped his right shoulder some. Has been having some left lateral hip and thigh pain and this has forced him to sleep more on the right shoulder lately.  He got his hip and thigh pain evaluated by orthopedist recently and there is plan for MRI of the low back to see if this may be coming from the lumbar spine, possible plan for injection after  that.  He had a visit with a PCP at the [place] on 10/17/2023: CBC was normal, hemoglobin A1c 6.1%, complete metabolic panel normal except serum creatinine 1.48/GFR 46.  Glucose 193. Urine microalbumin/creatinine 655 (normal less than 30). He was scheduled with an endocrinologist for next week.  No problem with high or low blood pressure at home.  ROS as above, plus--> no fevers, no CP, no SOB, no wheezing, no cough, no dizziness, no HAs, no rashes, no melena/hematochezia.  No polyuria or polydipsia. .  No focal weakness, paresthesias, or tremors.  No acute vision or hearing abnormalities.  No dysuria or unusual/new urinary urgency or frequency.  No recent changes in lower legs. No n/v/d or abd pain.  No palpitations.    Past Medical History:  Diagnosis Date   Anxiety and depression    Ascending aortic aneurysm (HCC) 01/22/2022   CAD in native artery 01/22/2022   Chronic renal insufficiency, stage 3 (moderate) (HCC)    Diabetes mellitus without complication (HCC)    Gait instability 01/22/2022   GERD (gastroesophageal reflux disease)    Gout    Hypercholesterolemia    Hypertension    Lumbar compression fracture (HCC)    L2, 20%, chronic (imaging 2024).  DEXA -1.0 04/2023   Orthostatic hypotension    OSA (obstructive sleep apnea)    Osteoarthritis of left hip    + troch burs   Prostate cancer (HCC)    1990-->prostatectomy, released from urol x  many years   Urinary incontinence     Past Surgical History:  Procedure Laterality Date   CHOLECYSTECTOMY, LAPAROSCOPIC     1992   DEXA     T score -1.0 Sept 2024   ROBOT ASSISTED LAPAROSCOPIC RADICAL PROSTATECTOMY     1999   ROTATOR CUFF REPAIR     Left 1991, right 1997   TOTAL KNEE ARTHROPLASTY     bilat 2004   TRANSTHORACIC ECHOCARDIOGRAM     11/24/22, EF nl, +LVH with no WMA, grd I DD, valves ok (no amyloid changes)    Outpatient Medications Prior to Visit  Medication Sig Dispense Refill   allopurinol (ZYLOPRIM) 300 MG tablet Take  1 tablet (300 mg total) by mouth daily. 90 tablet 1   aspirin EC 81 MG tablet Take 1 tablet every day by oral route.     atorvastatin (LIPITOR) 40 MG tablet TAKE 1 TABLET BY MOUTH EVERY DAY 90 tablet 1   Continuous Glucose Sensor (FREESTYLE LIBRE 3 PLUS SENSOR) MISC INJECT 1 EACH INTO THE SKIN EVERY 14 (FOURTEEN) DAYS. 2 each 2   fluticasone (FLONASE) 50 MCG/ACT nasal spray Place 1-2 sprays into both nostrils daily. 16 g 6   glimepiride (AMARYL) 1 MG tablet Take 1 tablet (1 mg total) by mouth daily with breakfast. 90 tablet 1   glucose blood test strip E11.9 Use to test blood sugar twice daily or as needed 100 each 12   hydrALAZINE (APRESOLINE) 25 MG tablet 1 tab p.o. as needed for blood pressure greater than 170 systolic or greater than 100 diastolic 30 tablet 0   ipratropium (ATROVENT) 0.03 % nasal spray PLACE 2 SPRAYS INTO BOTH NOSTRILS EVERY 12 (TWELVE) HOURS. USE 15 TO 30 MIN PRIOR TO BREAKFAST AND SUPPER 30 mL 6   losartan (COZAAR) 25 MG tablet Take 1 tablet (25 mg total) by mouth at bedtime. 90 tablet 2   metFORMIN (GLUCOPHAGE) 500 MG tablet TAKE 2 TABLETS (1,000 MG TOTAL) BY MOUTH 2 (TWO) TIMES DAILY WITH A MEAL. (Patient taking differently: Take 500 mg by mouth 2 (two) times daily with a meal. TAKE 2 TABLETS (1,000 MG TOTAL) BY MOUTH 2 (TWO) TIMES DAILY WITH A MEAL.) 360 tablet 0   metoprolol tartrate (LOPRESSOR) 25 MG tablet TAKE 1 TABLET BY MOUTH TWICE A DAY (Patient taking differently: Take 12.5 mg by mouth 2 (two) times daily.) 180 tablet 3   midodrine (PROAMATINE) 10 MG tablet TAKE 1 TABLET BEFORE GETTING OUT OF BED OR SOON AFTER, 1 TABLET AT 12PM, 1 TABLET AROUND 4PM (Patient taking differently: TAKE 1 TABLET BEFORE GETTING OUT OF BED OR SOON AFTER, 1 TABLET AT 12PM.) 270 tablet 2   Multiple Vitamins-Minerals (ICAPS AREDS 2 PO) TAKE 1 CAPSULE BY MOUTH TWICE A DAY FOR MACULAR DEGENERATION     omeprazole (PRILOSEC) 20 MG capsule Take 1 capsule (20 mg total) by mouth daily. 90 capsule 1    pioglitazone (ACTOS) 15 MG tablet Take 1 tablet (15 mg total) by mouth daily. 90 tablet 1   tirzepatide (MOUNJARO) 5 MG/0.5ML Pen Inject 5 mg into the skin once a week. 6 mL 3   venlafaxine (EFFEXOR) 75 MG tablet TAKE 1 TABLET BY MOUTH EVERY DAY 90 tablet 1   Continuous Glucose Receiver (FREESTYLE LIBRE 3 READER) DEVI Inject 1 each into the skin every 14 (fourteen) days. (Patient not taking: Reported on 11/03/2023) 2 each 2   predniSONE (DELTASONE) 20 MG tablet Take 1 tablet (20 mg total) by mouth daily with  breakfast. 5 tablet 0   No facility-administered medications prior to visit.    Allergies  Allergen Reactions   Lisinopril Cough, Swelling and Other (See Comments)   Review of Systems As per HPI  PE:    11/03/2023    9:32 AM 10/11/2023   10:35 AM 09/21/2023   11:07 AM  Vitals with BMI  Height 5\' 11"     Weight 244 lbs 10 oz 233 lbs 247 lbs 10 oz  BMI 34.13    Systolic 169 165 409  Diastolic 96 91 72  Pulse 95 88 77     Physical Exam  Gen: Alert, well appearing.  Patient is oriented to person, place, time, and situation. AFFECT: pleasant, lucid thought and speech. CV: RRR, no m/r/g.   LUNGS: CTA bilat, nonlabored resps, good aeration in all lung fields. EXT: no clubbing or cyanosis.  no edema.    LABS:  Last CBC Lab Results  Component Value Date   WBC 7.9 05/06/2023   HGB 13.5 05/06/2023   HCT 42.0 05/06/2023   MCV 90.3 05/06/2023   MCH 29.0 05/06/2023   RDW 13.8 05/06/2023   PLT 276 05/06/2023   Last metabolic panel Lab Results  Component Value Date   GLUCOSE 143 (H) 08/05/2023   NA 137 08/05/2023   K 5.0 08/05/2023   CL 99 08/05/2023   CO2 26 08/05/2023   BUN 26 (H) 08/05/2023   CREATININE 1.41 08/05/2023   GFR 45.41 (L) 08/05/2023   CALCIUM 9.7 08/05/2023   PROT 6.9 05/06/2023   ALBUMIN 4.1 01/26/2023   BILITOT 0.5 05/06/2023   ALKPHOS 73 01/26/2023   AST 24 05/06/2023   ALT 25 05/06/2023   Last lipids Lab Results  Component Value Date   CHOL  112 05/06/2023   HDL 41 05/06/2023   LDLCALC 48 05/06/2023   TRIG 146 05/06/2023   CHOLHDL 2.7 05/06/2023   Last hemoglobin A1c Lab Results  Component Value Date   HGBA1C 6.0 (A) 08/05/2023   HGBA1C 6.0 08/05/2023   HGBA1C 6.0 08/05/2023   HGBA1C 6.0 08/05/2023   Lab Results  Component Value Date   DDIMER 0.48 09/21/2023   IMPRESSION AND PLAN:  #1 diabetes with nephropathy, well-controlled. Most recent hemoglobin A1c on 10/17/2023 was 6.1%. Urine did show microalbuminuria.  He is going to see an endocrinologist next week and I anticipate that the urine microalbumin/creatinine will be repeated. Continue glimepiride 1 mg daily, metformin 1000 mg twice daily, pioglitazone 15 mg daily, and Mounjaro 5 mg weekly.  #2 hypertension, history of orthostatic hypotension. Doing well on losartan 25 mg a day, Lopressor 25 mg twice a day, and midodrine 10 mg 3 times a day.  He has hydralazine 25 mg tabs to take as needed significant elevation of blood pressure.  3.  Chronic renal insufficiency stage III.  Proteinuria recently detected on testing at the [place]. Anticipate the urine microalbumin/creatinine will be repeated at his upcoming endocrinologist visit. Serum creatinine was stable at 1.47 on 10/17/2023. He is on losartan 25 mg a day.  #4 chronic bilateral shoulder pain, right greater than left. He certainly has some rotator cuff tendinosis and he did benefit from a subacromial injection 3 months ago. However I suspect he has a significant component of osteoarthritis.  Will get plain films of both shoulders and discussed having him back for possible glenohumeral injection in the future. We may have to time this around his back injection if it is decided that he should get one.  No  labs needed b/c these were done at [facility] 10/17/23  An After Visit Summary was printed and given to the patient.  FOLLOW UP: No follow-ups on file.  Signed:  Santiago Bumpers, MD           11/03/2023

## 2023-11-07 ENCOUNTER — Ambulatory Visit
Admission: RE | Admit: 2023-11-07 | Discharge: 2023-11-07 | Disposition: A | Discharge status: Home / Self Care | Source: Ambulatory Visit | Attending: Family Medicine | Admitting: Family Medicine

## 2023-11-07 ENCOUNTER — Ambulatory Visit
Admission: RE | Admit: 2023-11-07 | Discharge: 2023-11-07 | Disposition: A | Discharge status: Home / Self Care | Source: Ambulatory Visit | Attending: Orthopedic Surgery | Admitting: Orthopedic Surgery

## 2023-11-07 DIAGNOSIS — M47816 Spondylosis without myelopathy or radiculopathy, lumbar region: Secondary | ICD-10-CM | POA: Diagnosis not present

## 2023-11-07 DIAGNOSIS — M5416 Radiculopathy, lumbar region: Secondary | ICD-10-CM

## 2023-11-07 DIAGNOSIS — M5126 Other intervertebral disc displacement, lumbar region: Secondary | ICD-10-CM | POA: Diagnosis not present

## 2023-11-07 DIAGNOSIS — G8929 Other chronic pain: Secondary | ICD-10-CM

## 2023-11-16 ENCOUNTER — Ambulatory Visit
Admission: RE | Admit: 2023-11-16 | Discharge: 2023-11-16 | Disposition: A | Discharge status: Home / Self Care | Source: Ambulatory Visit | Attending: Family Medicine | Admitting: Family Medicine

## 2023-11-16 DIAGNOSIS — Z7984 Long term (current) use of oral hypoglycemic drugs: Secondary | ICD-10-CM | POA: Diagnosis not present

## 2023-11-16 DIAGNOSIS — M25511 Pain in right shoulder: Secondary | ICD-10-CM | POA: Diagnosis not present

## 2023-11-16 DIAGNOSIS — H3530 Unspecified macular degeneration: Secondary | ICD-10-CM | POA: Diagnosis not present

## 2023-11-16 DIAGNOSIS — Z0100 Encounter for examination of eyes and vision without abnormal findings: Secondary | ICD-10-CM | POA: Diagnosis not present

## 2023-11-16 DIAGNOSIS — M19012 Primary osteoarthritis, left shoulder: Secondary | ICD-10-CM | POA: Diagnosis not present

## 2023-11-16 DIAGNOSIS — E119 Type 2 diabetes mellitus without complications: Secondary | ICD-10-CM | POA: Diagnosis not present

## 2023-11-16 DIAGNOSIS — M25512 Pain in left shoulder: Secondary | ICD-10-CM | POA: Diagnosis not present

## 2023-11-16 DIAGNOSIS — M19011 Primary osteoarthritis, right shoulder: Secondary | ICD-10-CM | POA: Diagnosis not present

## 2023-11-17 ENCOUNTER — Encounter: Payer: Self-pay | Admitting: Family Medicine

## 2023-11-21 ENCOUNTER — Encounter: Payer: Self-pay | Admitting: Orthopedic Surgery

## 2023-11-21 ENCOUNTER — Ambulatory Visit: Admitting: Orthopedic Surgery

## 2023-11-21 DIAGNOSIS — M79605 Pain in left leg: Secondary | ICD-10-CM

## 2023-11-21 DIAGNOSIS — M5416 Radiculopathy, lumbar region: Secondary | ICD-10-CM

## 2023-11-21 NOTE — Progress Notes (Signed)
 Office Visit Note   Patient: Jason [name]           Date of Birth: 06-Mar-1936           MRN: [id number] Visit Date: 11/21/2023              Requested by: Jeoffrey Massed, MD [id number] [place],  [place] [address] PCP: Jeoffrey Massed, MD  Chief Complaint  Patient presents with   Lower Back - Follow-up    MRI review      HPI: Patient is an 88 year old gentleman who presents in follow-up status post MRI scan.  Patient states he still has radicular pain in the lateral aspect of the left thigh.  He states it does ease off with Tylenol.  Assessment & Plan: Visit Diagnoses:  1. Pain in left leg   2. Lumbar back pain with radiculopathy affecting left lower extremity     Plan: Will refer patient to Dr. Alvester Morin for evaluation for epidural steroid injection.  Follow-Up Instructions: Return if symptoms worsen or fail to improve.   Ortho Exam  Patient is alert, oriented, no adenopathy, well-dressed, normal affect, normal respiratory effort. Examination patient ambulates with a cane in the right hand.  Patient has no focal motor weakness in either lower extremity.  No sciatic tension signs.  Focal pain over the lateral distal left thigh.  Review of the MRI scan shows disc bulging to the left at L1-2 and L2-3 as well as disc bulging at L3-4 L4-5.  Imaging: No results found. No images are attached to the encounter.  Labs: Lab Results  Component Value Date   HGBA1C 6.1 10/17/2023   HGBA1C 6.0 (A) 08/05/2023   HGBA1C 6.0 08/05/2023   HGBA1C 6.0 08/05/2023   HGBA1C 6.0 08/05/2023     Lab Results  Component Value Date   ALBUMIN 4.5 10/17/2023   ALBUMIN 4.1 01/26/2023   ALBUMIN 4.0 10/25/2022    No results found for: "MG" Lab Results  Component Value Date   VD25OH 76 10/17/2023    No results found for: "PREALBUMIN"    Latest Ref Rng & Units 10/17/2023   12:00 AM 05/06/2023   11:09 AM 04/12/2022   11:24 AM  CBC EXTENDED  WBC  12.5  7.9  8.9   RBC 3.87 - 5.11  4.89  4.65  4.37   Hemoglobin 13.5 - 17.5 14.6  13.5  13.0   HCT 41 - 53 45  42.0  39.3   Platelets 140 - 400 Thousand/uL  276  275   NEUT#  5.79  4,116    Lymph# 850 - 3,900 cells/uL  2,670       There is no height or weight on file to calculate BMI.  Orders:  No orders of the defined types were placed in this encounter.  No orders of the defined types were placed in this encounter.    Procedures: No procedures performed  Clinical Data: No additional findings.  ROS:  All other systems negative, except as noted in the HPI. Review of Systems  Objective: Vital Signs: There were no vitals taken for this visit.  Specialty Comments:  No specialty comments available.  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   Obstructive sleep apnea of adult 08/11/2022   Diabetic nephropathy with proteinuria (HCC) 08/11/2022   Obesity 08/11/2022   Hyperlipidemia 08/11/2022  Stage 3a chronic kidney disease (HCC) 08/11/2022   Anxiety disorder, unspecified 08/11/2022   Diabetes mellitus (HCC) 08/11/2022   Primary malignant neoplasm of prostate (HCC) 08/02/2022   Carcinoma of prostate (HCC) 08/02/2022   Shortness of breath 02/22/2022   Pure hypercholesterolemia 02/22/2022   Pain in joint, shoulder region 02/22/2022   Other malaise and fatigue 02/22/2022   Nonexudative age-related macular degeneration, bilateral, intermediate dry stage 02/22/2022   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   Gastroesophageal reflux disease 02/22/2022   Benign paroxysmal positional vertigo 02/22/2022   CAD in native artery 01/22/2022   Thoracic aortic aneurysm, without rupture, unspecified (HCC) 01/22/2022   Gait instability 01/22/2022   Essential hypertension 01/17/2022   Type 2 diabetes mellitus without complications (HCC) 01/17/2022   HLD (hyperlipidemia) 01/17/2022   Obstructive sleep apnea  01/17/2022   Sensorineural hearing loss, bilateral 11/09/2021   Orthostatic hypotension 11/09/2021   Imbalance 11/09/2021   Overweight 02/21/2020   First degree heart block 02/21/2020   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   Malignant tumor of prostate (HCC) 02/21/2020   Vitamin D deficiency 05/31/2016   History of malignant neoplasm of prostate 05/31/2016   History of malignant neoplasm of skin 05/31/2016   Bilateral hearing loss 05/31/2016   Anxiety state 05/31/2016   Bronchospasm 09/24/2010   Cough 09/19/2010   Acute bronchitis 09/19/2010   Allergic rhinitis due to pollen 09/19/2010   Acute sinusitis 09/19/2010   Past Medical History:  Diagnosis Date   Anxiety and depression    Ascending aortic aneurysm (HCC) 01/22/2022   Bilateral shoulder pain, unspecified chronicity    XR 10/2023-->+Bilat RC tendonopathy+AC arth and mild GH arth on R.  Severe GH arth/RC arthropathy on L   CAD in native artery 01/22/2022   Chronic renal insufficiency, stage 3 (moderate) (HCC)    Diabetes mellitus without complication (HCC)    Gait instability 01/22/2022   GERD (gastroesophageal reflux disease)    Gout    Hypercholesterolemia    Hypertension    Lumbar compression fracture (HCC)    L2, 20%, chronic (imaging 2024).  DEXA -1.0 04/2023   Orthostatic hypotension    OSA (obstructive sleep apnea)    Osteoarthritis of left hip    + troch burs   Prostate cancer (HCC)    1990-->prostatectomy, released from urol x many years   Urinary incontinence     Family History  Problem Relation Age of Onset   Heart attack Mother    Cancer Father    Cancer Sister     Past Surgical History:  Procedure Laterality Date   CHOLECYSTECTOMY, LAPAROSCOPIC     1992   DEXA     T score -1.0 Sept 2024   ROBOT ASSISTED LAPAROSCOPIC RADICAL PROSTATECTOMY     1999   ROTATOR CUFF REPAIR     Left 1991, right 1997   TOTAL KNEE ARTHROPLASTY     bilat 2004   TRANSTHORACIC ECHOCARDIOGRAM     11/24/22, EF nl, +LVH with  no WMA, grd I DD, valves ok (no amyloid changes)   Social History   Occupational History   Not on file  Tobacco Use   Smoking status: Former    Types: Pipe   Smokeless tobacco: Never  Vaping Use   Vaping status: Never Used  Substance and Sexual Activity   Alcohol use: Yes    Alcohol/week: 2.0 standard drinks of alcohol    Types: 2 Glasses of wine per week   Drug use: Never   Sexual activity: Not Currently

## 2023-11-23 ENCOUNTER — Ambulatory Visit (INDEPENDENT_AMBULATORY_CARE_PROVIDER_SITE_OTHER): Admitting: *Deleted

## 2023-11-23 DIAGNOSIS — Z0000 Encounter for general adult medical examination without abnormal findings: Secondary | ICD-10-CM

## 2023-11-23 NOTE — Patient Instructions (Signed)
 Jason [name] , Thank you for taking time to come for your Medicare Wellness Visit. I appreciate your ongoing commitment to your health goals. Please review the following plan we discussed and let me know if I can assist you in the future.   Screening recommendations/referrals: Colonoscopy: no longer required Recommended yearly ophthalmology/optometry visit for glaucoma screening and checkup Recommended yearly dental visit for hygiene and checkup  Vaccinations: Influenza vaccine: up to date Pneumococcal vaccine: up to date Tdap vaccine: Education provided Shingles vaccine: up to date    Advanced directives: yes      Preventive Care 65 Years and Older, Male Preventive care refers to lifestyle choices and visits with your health care provider that can promote health and wellness. What does preventive care include? A yearly physical exam. This is also called an annual well check. Dental exams once or twice a year. Routine eye exams. Ask your health care provider how often you should have your eyes checked. Personal lifestyle choices, including: Daily care of your teeth and gums. Regular physical activity. Eating a healthy diet. Avoiding tobacco and drug use. Limiting alcohol use. Practicing safe sex. Taking low doses of aspirin every day. Taking vitamin and mineral supplements as recommended by your health care provider. What happens during an annual well check? The services and screenings done by your health care provider during your annual well check will depend on your age, overall health, lifestyle risk factors, and family history of disease. Counseling  Your health care provider may ask you questions about your: Alcohol use. Tobacco use. Drug use. Emotional well-being. Home and relationship well-being. Sexual activity. Eating habits. History of falls. Memory and ability to understand (cognition). Work and work Astronomer. Screening  You may have the following tests or  measurements: Height, weight, and BMI. Blood pressure. Lipid and cholesterol levels. These may be checked every 5 years, or more frequently if you are over 56 years old. Skin check. Lung cancer screening. You may have this screening every year starting at age 56 if you have a 30-pack-year history of smoking and currently smoke or have quit within the past 15 years. Fecal occult blood test (FOBT) of the stool. You may have this test every year starting at age 51. Flexible sigmoidoscopy or colonoscopy. You may have a sigmoidoscopy every 5 years or a colonoscopy every 10 years starting at age 5. Prostate cancer screening. Recommendations will vary depending on your family history and other risks. Hepatitis C blood test. Hepatitis B blood test. Sexually transmitted disease (STD) testing. Diabetes screening. This is done by checking your blood sugar (glucose) after you have not eaten for a while (fasting). You may have this done every 1-3 years. Abdominal aortic aneurysm (AAA) screening. You may need this if you are a current or former smoker. Osteoporosis. You may be screened starting at age 62 if you are at high risk. Talk with your health care provider about your test results, treatment options, and if necessary, the need for more tests. Vaccines  Your health care provider may recommend certain vaccines, such as: Influenza vaccine. This is recommended every year. Tetanus, diphtheria, and acellular pertussis (Tdap, Td) vaccine. You may need a Td booster every 10 years. Zoster vaccine. You may need this after age 34. Pneumococcal 13-valent conjugate (PCV13) vaccine. One dose is recommended after age 39. Pneumococcal polysaccharide (PPSV23) vaccine. One dose is recommended after age 86. Talk to your health care provider about which screenings and vaccines you need and how often you need them.  This information is not intended to replace advice given to you by your health care provider. Make sure  you discuss any questions you have with your health care provider. Document Released: 08/29/2015 Document Revised: 04/21/2016 Document Reviewed: 06/03/2015 Elsevier Interactive Patient Education  2017 [employer].  Fall Prevention in the Home Falls can cause injuries. They can happen to people of all ages. There are many things you can do to make your home safe and to help prevent falls. What can I do on the outside of my home? Regularly fix the edges of walkways and driveways and fix any cracks. Remove anything that might make you trip as you walk through a door, such as a raised step or threshold. Trim any bushes or trees on the path to your home. Use bright outdoor lighting. Clear any walking paths of anything that might make someone trip, such as rocks or tools. Regularly check to see if handrails are loose or broken. Make sure that both sides of any steps have handrails. Any raised decks and porches should have guardrails on the edges. Have any leaves, snow, or ice cleared regularly. Use sand or salt on walking paths during winter. Clean up any spills in your garage right away. This includes oil or grease spills. What can I do in the bathroom? Use night lights. Install grab bars by the toilet and in the tub and shower. Do not use towel bars as grab bars. Use non-skid mats or decals in the tub or shower. If you need to sit down in the shower, use a plastic, non-slip stool. Keep the floor dry. Clean up any water that spills on the floor as soon as it happens. Remove soap buildup in the tub or shower regularly. Attach bath mats securely with double-sided non-slip rug tape. Do not have throw rugs and other things on the floor that can make you trip. What can I do in the bedroom? Use night lights. Make sure that you have a light by your bed that is easy to reach. Do not use any sheets or blankets that are too big for your bed. They should not hang down onto the floor. Have a firm  chair that has side arms. You can use this for support while you get dressed. Do not have throw rugs and other things on the floor that can make you trip. What can I do in the kitchen? Clean up any spills right away. Avoid walking on wet floors. Keep items that you use a lot in easy-to-reach places. If you need to reach something above you, use a strong step stool that has a grab bar. Keep electrical cords out of the way. Do not use floor polish or wax that makes floors slippery. If you must use wax, use non-skid floor wax. Do not have throw rugs and other things on the floor that can make you trip. What can I do with my stairs? Do not leave any items on the stairs. Make sure that there are handrails on both sides of the stairs and use them. Fix handrails that are broken or loose. Make sure that handrails are as long as the stairways. Check any carpeting to make sure that it is firmly attached to the stairs. Fix any carpet that is loose or worn. Avoid having throw rugs at the top or bottom of the stairs. If you do have throw rugs, attach them to the floor with carpet tape. Make sure that you have a light switch at the  top of the stairs and the bottom of the stairs. If you do not have them, ask someone to add them for you. What else can I do to help prevent falls? Wear shoes that: Do not have high heels. Have rubber bottoms. Are comfortable and fit you well. Are closed at the toe. Do not wear sandals. If you use a stepladder: Make sure that it is fully opened. Do not climb a closed stepladder. Make sure that both sides of the stepladder are locked into place. Ask someone to hold it for you, if possible. Clearly mark and make sure that you can see: Any grab bars or handrails. First and last steps. Where the edge of each step is. Use tools that help you move around (mobility aids) if they are needed. These include: Canes. Walkers. Scooters. Crutches. Turn on the lights when you go  into a dark area. Replace any light bulbs as soon as they burn out. Set up your furniture so you have a clear path. Avoid moving your furniture around. If any of your floors are uneven, fix them. If there are any pets around you, be aware of where they are. Review your medicines with your doctor. Some medicines can make you feel dizzy. This can increase your chance of falling. Ask your doctor what other things that you can do to help prevent falls. This information is not intended to replace advice given to you by your health care provider. Make sure you discuss any questions you have with your health care provider. Document Released: 05/29/2009 Document Revised: 01/08/2016 Document Reviewed: 09/06/2014 Elsevier Interactive Patient Education  2017 [employer].

## 2023-11-23 NOTE — Progress Notes (Signed)
 Subjective:   Jason [name] is a 88 y.o. male who presents for Medicare Annual/Subsequent preventive examination.  Visit Complete: Virtual I connected with  [name] on 11/23/23 by a audio enabled telemedicine application and verified that I am speaking with the correct person using two identifiers.  Patient Location: Home  Provider Location: Home Office  I discussed the limitations of evaluation and management by telemedicine. The patient expressed understanding and agreed to proceed.  Vital Signs: Because this visit was a virtual/telehealth visit, some criteria may be missing or patient reported. Any vitals not documented were not able to be obtained and vitals that have been documented are patient reported.  Cardiac Risk Factors include: advanced age (>70men, >41 women);hypertension;male gender;diabetes mellitus     Objective:    Today's Vitals   11/23/23 0813  PainSc: 5    There is no height or weight on file to calculate BMI.     11/23/2023    8:16 AM 12/23/2022    9:13 AM 12/02/2022    8:58 AM 02/23/2022    1:47 PM 12/09/2021    1:12 PM  Advanced Directives  Does Patient Have a Medical Advance Directive? Yes Yes Yes Yes Yes  Type of Advance Directive Living will Healthcare Power of [employer] of [place];Living will Healthcare Power of [place];Living will Healthcare Power of [place];Living will  Does patient want to make changes to medical advance directive?   Yes (ED - Information included in AVS)    Copy of Healthcare Power of Attorney in Chart?  No - copy requested  No - copy requested No - copy requested    Current Medications (verified) Outpatient Encounter Medications as of 11/23/2023  Medication Sig   allopurinol (ZYLOPRIM) 300 MG tablet Take 1 tablet (300 mg total) by mouth daily.   aspirin EC 81 MG tablet Take 1 tablet every day by oral route.   atorvastatin (LIPITOR) 40 MG tablet TAKE 1 TABLET BY MOUTH EVERY DAY   Continuous Glucose Receiver  (FREESTYLE LIBRE 3 READER) DEVI Inject 1 each into the skin every 14 (fourteen) days.   Continuous Glucose Sensor (FREESTYLE LIBRE 3 PLUS SENSOR) MISC INJECT 1 EACH INTO THE SKIN EVERY 14 (FOURTEEN) DAYS.   fluticasone (FLONASE) 50 MCG/ACT nasal spray Place 1-2 sprays into both nostrils daily.   glimepiride (AMARYL) 1 MG tablet Take 1 tablet (1 mg total) by mouth daily with breakfast.   glucose blood test strip E11.9 Use to test blood sugar twice daily or as needed   hydrALAZINE (APRESOLINE) 25 MG tablet 1 tab p.o. as needed for blood pressure greater than 170 systolic or greater than 100 diastolic   ipratropium (ATROVENT) 0.03 % nasal spray PLACE 2 SPRAYS INTO BOTH NOSTRILS EVERY 12 (TWELVE) HOURS. USE 15 TO 30 MIN PRIOR TO BREAKFAST AND SUPPER   losartan (COZAAR) 25 MG tablet Take 1 tablet (25 mg total) by mouth at bedtime.   metFORMIN (GLUCOPHAGE) 500 MG tablet TAKE 2 TABLETS (1,000 MG TOTAL) BY MOUTH 2 (TWO) TIMES DAILY WITH A MEAL. (Patient taking differently: Take 500 mg by mouth 2 (two) times daily with a meal. TAKE 2 TABLETS (1,000 MG TOTAL) BY MOUTH 2 (TWO) TIMES DAILY WITH A MEAL.)   metoprolol tartrate (LOPRESSOR) 25 MG tablet TAKE 1 TABLET BY MOUTH TWICE A DAY (Patient taking differently: Take 12.5 mg by mouth 2 (two) times daily.)   midodrine (PROAMATINE) 10 MG tablet TAKE 1 TABLET BEFORE GETTING OUT OF BED OR SOON AFTER, 1  TABLET AT 12PM, 1 TABLET AROUND 4PM (Patient taking differently: TAKE 1 TABLET BEFORE GETTING OUT OF BED OR SOON AFTER, 1 TABLET AT 12PM.)   Multiple Vitamins-Minerals (ICAPS AREDS 2 PO) TAKE 1 CAPSULE BY MOUTH TWICE A DAY FOR MACULAR DEGENERATION   omeprazole (PRILOSEC) 20 MG capsule Take 1 capsule (20 mg total) by mouth daily.   pioglitazone (ACTOS) 15 MG tablet Take 1 tablet (15 mg total) by mouth daily.   tirzepatide [facility]) 5 MG/0.5ML Pen Inject 5 mg into the skin once a week.   venlafaxine (EFFEXOR) 75 MG tablet TAKE 1 TABLET BY MOUTH EVERY DAY   No  facility-administered encounter medications on file as of 11/23/2023.    Allergies (verified) Lisinopril   History: Past Medical History:  Diagnosis Date   Anxiety and depression    Ascending aortic aneurysm (HCC) 01/22/2022   Bilateral shoulder pain, unspecified chronicity    XR 10/2023-->+Bilat RC tendonopathy+AC arth and mild GH arth on R.  Severe GH arth/RC arthropathy on L   CAD in native artery 01/22/2022   Chronic renal insufficiency, stage 3 (moderate) (HCC)    Diabetes mellitus without complication (HCC)    Gait instability 01/22/2022   GERD (gastroesophageal reflux disease)    Gout    Hypercholesterolemia    Hypertension    Lumbar compression fracture (HCC)    L2, 20%, chronic (imaging 2024).  DEXA -1.0 04/2023   Orthostatic hypotension    OSA (obstructive sleep apnea)    Osteoarthritis of left hip    + troch burs   Prostate cancer (HCC)    1990-->prostatectomy, released from urol x many years   Urinary incontinence    Past Surgical History:  Procedure Laterality Date   CHOLECYSTECTOMY, LAPAROSCOPIC     1992   DEXA     T score -1.0 Sept 2024   ROBOT ASSISTED LAPAROSCOPIC RADICAL PROSTATECTOMY     1999   ROTATOR CUFF REPAIR     Left 1991, right 1997   TOTAL KNEE ARTHROPLASTY     bilat 2004   TRANSTHORACIC ECHOCARDIOGRAM     11/24/22, EF nl, +LVH with no WMA, grd I DD, valves ok (no amyloid changes)   Family History  Problem Relation Age of Onset   Heart attack Mother    Cancer Father    Cancer Sister    Social History   Socioeconomic History   Marital status: Married    Spouse name: Not on file   Number of children: Not on file   Years of education: Not on file   Highest education level: Doctorate  Occupational History   Not on file  Tobacco Use   Smoking status: Former    Types: Pipe   Smokeless tobacco: Never  Vaping Use   Vaping status: Never Used  Substance and Sexual Activity   Alcohol use: Yes    Alcohol/week: 2.0 standard drinks of  alcohol    Types: 2 Glasses of wine per week   Drug use: Never   Sexual activity: Not Currently  Other Topics Concern   Not on file  Social History Narrative   Married, 3 sons and 2 daughters.   Originally from [place].   Retired from [employer].   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   No tob.  Occ alc   Social Drivers of Health   Financial Resource Strain: Low Risk  (11/23/2023)   Overall Financial Resource Strain (CARDIA)    Difficulty of Paying Living Expenses: Not hard at all  Recent Concern: Financial Resource Strain - Medium Risk (11/23/2023)   Overall Financial Resource Strain (CARDIA)    Difficulty of Paying Living Expenses: Somewhat hard  Food Insecurity: No Food Insecurity (11/23/2023)   Hunger Vital Sign    Worried About Running Out of Food in the Last Year: Never true    Ran Out of Food in the Last Year: Never true  Transportation Needs: No Transportation Needs (11/23/2023)   PRAPARE - Administrator, Civil Service (Medical): No    Lack of Transportation (Non-Medical): No  Physical Activity: Inactive (11/23/2023)   Exercise Vital Sign    Days of Exercise per Week: 0 days    Minutes of Exercise per Session: 0 min  Stress: No Stress Concern Present (11/23/2023)   [employer] of Occupational Health - Occupational Stress Questionnaire    Feeling of Stress : Only a little  Social Connections: Moderately Integrated (11/23/2023)   Social Connection and Isolation Panel [NHANES]    Frequency of Communication with Friends and Family: Twice a week    Frequency of Social Gatherings with Friends and Family: Once a week    Attends Religious Services: 1 to 4 times per year    Active Member of [employer] or Organizations: No    Attends Engineer, structural: Never    Marital Status: Married    Tobacco Counseling Counseling given: Not Answered   Clinical Intake:  Pre-visit preparation completed: Yes  Pain : 0-10 Pain Score: 5  Pain Type: Chronic  pain Pain Location: Leg Pain Orientation: Left Pain Descriptors / Indicators: Burning, Aching, Constant, Dull Pain Onset: In the past 7 days Pain Relieving Factors: tylenol  Pain Relieving Factors: tylenol  Diabetes: Yes CBG done?: No Did pt. bring in CBG monitor from home?: No  How often do you need to have someone help you when you read instructions, pamphlets, or other written materials from your doctor or pharmacy?: 1 - Never  Interpreter Needed?: No  Information entered by :: Remi Haggard LPN   Activities of Daily Living    11/23/2023    8:20 AM 12/23/2022    9:15 AM  In your present state of health, do you have any difficulty performing the following activities:  Hearing? 1 1  Vision? 0 0  Difficulty concentrating or making decisions? 0 0  Walking or climbing stairs? 1 1  Dressing or bathing? 0 0  Doing errands, shopping? 0 0  Preparing Food and eating ? N N  Using the Toilet? N N  In the past six months, have you accidently leaked urine? Y Y  Do you have problems with loss of bowel control? N N  Managing your Medications? N N  Managing your Finances? N N  Housekeeping or managing your Housekeeping? N N    Patient Care Team: Jeoffrey Massed, MD as PCP - General (Family Medicine) Chilton Si, MD as PCP - Cardiology (Cardiology)  Indicate any recent Medical Services you may have received from other than [facility] providers in the past year (date may be approximate).     Assessment:   This is a routine wellness examination for Troye.  Hearing/Vision screen Hearing Screening - Comments:: Bilateral hearing aids Vision Screening - Comments:: [facility] Up to date   Goals Addressed             This Visit's Progress    Patient Stated       Get pain in leg taken care  Depression Screen    11/23/2023    8:18 AM 09/21/2023   11:13 AM 06/03/2023   11:17 AM 05/06/2023   10:50 AM 04/08/2023    1:22 PM 01/26/2023    8:41 AM 12/23/2022    9:21 AM  PHQ  2/9 Scores  PHQ - 2 Score 1 2 4 2 4 3  0  PHQ- 9 Score 2 4 6 3 7 5 2     Fall Risk    11/23/2023    8:20 AM 09/21/2023   11:13 AM 06/03/2023   11:16 AM 05/06/2023   10:29 AM 05/05/2023    2:47 PM  Fall Risk   Falls in the past year? 1 0 1 1 1   Number falls in past yr: 1 0 1 1 1   Injury with Fall? 0 0 0 0 0  Risk for fall due to : Impaired balance/gait No Fall Risks History of fall(s);Impaired balance/gait Impaired mobility   Follow up Falls evaluation completed;Education provided;Falls prevention discussed Falls evaluation completed Falls evaluation completed Falls evaluation completed     MEDICARE RISK AT HOME: Medicare Risk at Home Any stairs in or around the home?: Yes If so, are there any without handrails?: No Home free of loose throw rugs in walkways, pet beds, electrical cords, etc?: Yes Adequate lighting in your home to reduce risk of falls?: Yes Life alert?: No Use of a cane, walker or w/c?: Yes Grab bars in the bathroom?: Yes Shower chair or bench in shower?: No Elevated toilet seat or a handicapped toilet?: Yes  TIMED UP AND GO:  Was the test performed?  No    Cognitive Function:        11/23/2023    8:16 AM 12/23/2022    9:17 AM  6CIT Screen  What Year? 0 points 0 points  What month? 0 points 0 points  What time? 0 points 0 points  Count back from 20 0 points 0 points  Months in reverse 0 points 0 points  Repeat phrase 0 points 0 points  Total Score 0 points 0 points    Immunizations Immunization History  Administered Date(s) Administered   Fluad Quad(high Dose 65+) 04/16/2022   H1N1 05/13/2015   Influenza Split 09/24/2010, 04/23/2011   Influenza, High Dose Seasonal PF 05/01/2018, 04/29/2021, 04/29/2023   Influenza,inj,quad, With Preservative 04/25/2019   Influenza-Unspecified 04/17/2011, 04/16/2012, 04/16/2013, 06/06/2013, 04/16/2017, 04/16/2018   Moderna Covid-19 Fall Seasonal Vaccine 43yrs & older 11/02/2022   Moderna Sars-Covid-2 Vaccination  09/20/2019, 10/18/2019, 06/26/2020   Pfizer Covid-19 Vaccine Bivalent Booster 18yrs & up 09/11/2021   Pneumococcal Conjugate-13 05/23/2014   Pneumococcal Polysaccharide-23 09/19/2010   Pneumococcal-Unspecified 05/18/2011   Respiratory Syncytial Virus Vaccine,Recomb Aduvanted(Arexvy) 04/16/2022   Td (Adult),unspecified 05/18/2011   Tdap 06/06/2013   Tetanus 08/16/2009, 05/18/2011   Zoster Recombinant(Shingrix) 04/14/2020, 05/23/2020, 07/24/2020   Zoster, Live 02/15/2013    TDAP status: Due, Education has been provided regarding the importance of this vaccine. Advised may receive this vaccine at local pharmacy or Health Dept. Aware to provide a copy of the vaccination record if obtained from local pharmacy or Health Dept. Verbalized acceptance and understanding.  Flu Vaccine status: Up to date  Pneumococcal vaccine status: Up to date  Covid-19 vaccine status: Information provided on how to obtain vaccines.   Qualifies for Shingles Vaccine? No   Zostavax completed Yes   Shingrix Completed?: Yes  Screening Tests Health Maintenance  Topic Date Due   OPHTHALMOLOGY EXAM  01/11/2024   INFLUENZA VACCINE  03/16/2024   FOOT  EXAM  04/07/2024   HEMOGLOBIN A1C  04/18/2024   Diabetic kidney evaluation - eGFR measurement  10/16/2024   Diabetic kidney evaluation - Urine ACR  10/16/2024   Medicare Annual Wellness (AWV)  11/22/2024   Pneumonia Vaccine 44+ Years old  Completed   Zoster Vaccines- Shingrix  Completed   HPV VACCINES  Aged Out   DTaP/Tdap/Td  Discontinued   COVID-19 Vaccine  Discontinued    Health Maintenance  There are no preventive care reminders to display for this patient.   Colorectal cancer screening: No longer required.   Lung Cancer Screening: (Low Dose CT Chest recommended if Age 38-80 years, 20 pack-year currently smoking OR have quit w/in 15years.) does not qualify.   Lung Cancer Screening Referral:   Additional Screening:  Hepatitis C Screening:   hep  c  Vision Screening: Recommended annual ophthalmology exams for early detection of glaucoma and other disorders of the eye. Is the patient up to date with their annual eye exam?  No  Who is the provider or what is the name of the office in which the patient attends annual eye exams? [facility] If pt is not established with a provider, would they like to be referred to a provider to establish care? No .   Dental Screening: Recommended annual dental exams for proper oral hygiene  Nutrition Risk Assessment:  Has the patient had any N/V/D within the last 2 months?  No  Does the patient have any non-healing wounds?  No  Has the patient had any unintentional weight loss or weight gain?  No   Diabetes:  Is the patient diabetic?  Yes  If diabetic, was a CBG obtained today?  No  Did the patient bring in their glucometer from home?  No  How often do you monitor your CBG's? monitor.   Financial Strains and Diabetes Management:  Are you having any financial strains with the device, your supplies or your medication? No .  Does the patient want to be seen by Chronic Care Management for management of their diabetes?  No  Would the patient like to be referred to a Nutritionist or for Diabetic Management?  No   Diabetic Exams:  Diabetic Eye Exam: .Pt has been advised about the importance in completing this exam  Diabetic Foot Exam. Pt has been advised about the importance in completing this exam.    Community Resource Referral / Chronic Care Management: CRR required this visit?  No   CCM required this visit?  No     Plan:     I have personally reviewed and noted the following in the patient's chart:   Medical and social history Use of alcohol, tobacco or illicit drugs  Current medications and supplements including opioid prescriptions. Patient is not currently taking opioid prescriptions. Functional ability and status Nutritional status Physical activity Advanced  directives List of other physicians Hospitalizations, surgeries, and ER visits in previous 12 months Vitals Screenings to include cognitive, depression, and falls Referrals and appointments  In addition, I have reviewed and discussed with patient certain preventive protocols, quality metrics, and best practice recommendations. A written personalized care plan for preventive services as well as general preventive health recommendations were provided to patient.     Remi Haggard, LPN   08/21/1094   After Visit Summary: (MyChart) Due to this being a telephonic visit, the after visit summary with patients personalized plan was offered to patient via MyChart   Nurse Notes:

## 2023-11-28 ENCOUNTER — Telehealth: Payer: Self-pay | Admitting: Orthopedic Surgery

## 2023-11-28 NOTE — Telephone Encounter (Signed)
 Patient called. Would like [name] to call him. His phone disconnected in the middle of the message.

## 2023-11-28 NOTE — Telephone Encounter (Signed)
 This is a duplicate message. This has been done.

## 2023-11-28 NOTE — Telephone Encounter (Signed)
 Pt states that he wanted to make sure that he needed the [facility] because he was not having back pain but that is what is documented in his note. I reviewed with the pt Dr. Aniceto Kern note from the MRI review that showed multiple bulging disc at the L spine. That was causing per Dr. Julio Ohm the radicular s/s in his leg. Pt voiced understanding and will call with any other questions.

## 2023-11-28 NOTE — Telephone Encounter (Signed)
 Pt called back and due to phone disconnection. Pt is asking for a call from [place] W or [name] F. Pt phone number is [phone].

## 2023-11-29 ENCOUNTER — Other Ambulatory Visit

## 2023-12-01 ENCOUNTER — Other Ambulatory Visit: Payer: Self-pay | Admitting: Family Medicine

## 2023-12-01 DIAGNOSIS — E119 Type 2 diabetes mellitus without complications: Secondary | ICD-10-CM

## 2023-12-04 ENCOUNTER — Other Ambulatory Visit: Payer: Self-pay | Admitting: Family Medicine

## 2023-12-04 DIAGNOSIS — M109 Gout, unspecified: Secondary | ICD-10-CM

## 2023-12-06 ENCOUNTER — Ambulatory Visit: Admitting: "Endocrinology

## 2023-12-08 ENCOUNTER — Ambulatory Visit: Admitting: Physical Medicine and Rehabilitation

## 2023-12-08 ENCOUNTER — Other Ambulatory Visit: Payer: Self-pay

## 2023-12-08 VITALS — BP 155/90 | HR 91

## 2023-12-08 DIAGNOSIS — M5416 Radiculopathy, lumbar region: Secondary | ICD-10-CM

## 2023-12-08 MED ORDER — METHYLPREDNISOLONE ACETATE 40 MG/ML IJ SUSP
40.0000 mg | Freq: Once | INTRAMUSCULAR | Status: AC
  Administered 2023-12-08: 40 mg

## 2023-12-08 NOTE — Progress Notes (Signed)
 Pain Scale   Average Pain 7 Patient advising he pain is constant when walking and standing, sitting and resting does ease pain along with Tylenol.        +Driver, -BT, -Dye Allergies.

## 2023-12-08 NOTE — Patient Instructions (Signed)

## 2023-12-12 ENCOUNTER — Other Ambulatory Visit: Payer: Self-pay | Admitting: Family Medicine

## 2023-12-12 DIAGNOSIS — M109 Gout, unspecified: Secondary | ICD-10-CM

## 2023-12-12 NOTE — Telephone Encounter (Unsigned)
 Copied from CRM [id number]. Topic: Clinical - Medication Refill >> Dec 12, 2023  9:36 AM Dimple Francis wrote: Most Recent Primary Care Visit:  Provider: GREER, JULIE M  Department: [facility]  Visit Type: ANNUAL WELL VISIT, SEQUENTIAL  Date: 11/23/2023  Medication: allopurinol  (ZYLOPRIM ) 300 MG tablet  Has the patient contacted their pharmacy? Yes (Agent: If no, request that the patient contact the pharmacy for the refill. If patient does not wish to contact the pharmacy document the reason why and proceed with request.) (Agent: If yes, when and what did the pharmacy advise?)  Is this the correct pharmacy for this prescription? Yes If no, delete pharmacy and type the correct one.  This is the patient's preferred pharmacy:  [id number] - [place], [place] - [address] [place] [address] Phone: [phone] Fax: [phone]   Has the prescription been filled recently? Yes  Is the patient out of the medication? Yes  Has the patient been seen for an appointment in the last year OR does the patient have an upcoming appointment? Yes  Can we respond through MyChart? Yes  Agent: Please be advised that Rx refills may take up to 3 business days. We ask that you follow-up with your pharmacy.

## 2023-12-13 MED ORDER — ALLOPURINOL 300 MG PO TABS: 300.0000 mg | ORAL_TABLET | Freq: Every day | ORAL | 1 refills | Status: DC

## 2023-12-13 NOTE — Procedures (Signed)
 Lumbar Epidural Steroid Injection - Interlaminar Approach with Fluoroscopic Guidance  Patient: Jason [name]      Date of Birth: [birth year] MRN: [id number] PCP: Shelvia Dick, MD      Visit Date: 12/08/2023   Universal Protocol:     Consent Given By: the patient  Position: PRONE  Additional Comments: Vital signs were monitored before and after the procedure. Patient was prepped and draped in the usual sterile fashion. The correct patient, procedure, and site was verified.   Injection Procedure Details:   Procedure diagnoses: Lumbar radiculopathy [M54.16]   Meds Administered:  Meds ordered this encounter  Medications   methylPREDNISolone  acetate (DEPO-MEDROL ) injection 40 mg     Laterality: Left  Location/Site:  L5-S1  Needle: 3.5 in., 20 ga. Tuohy  Needle Placement: Paramedian epidural  Findings:   -Comments: Excellent flow of contrast into the epidural space.  Procedure Details: Using a paramedian approach from the side mentioned above, the region overlying the inferior lamina was localized under fluoroscopic visualization and the soft tissues overlying this structure were infiltrated with 4 ml. of 1% Lidocaine without Epinephrine. The Tuohy needle was inserted into the epidural space using a paramedian approach.   The epidural space was localized using loss of resistance along with counter oblique bi-planar fluoroscopic views.  After negative aspirate for air, blood, and CSF, a 2 ml. volume of Isovue-250 was injected into the epidural space and the flow of contrast was observed. Radiographs were obtained for documentation purposes.    The injectate was administered into the level noted above.   Additional Comments:  The patient tolerated the procedure well Dressing: 2 x 2 sterile gauze and Band-Aid    Post-procedure details: Patient was observed during the procedure. Post-procedure instructions were reviewed.  Patient left the clinic in stable condition.

## 2023-12-13 NOTE — Progress Notes (Signed)
 Jason [name] - 88 y.o. male MRN [id number]  Date of birth: 13-Mar-1936  Office Visit Note: Visit Date: 12/08/2023 PCP: Shelvia Dick, MD Referred by: Shelvia Dick, MD  Subjective: Chief Complaint  Patient presents with   Lower Back - Pain   HPI:  Jason [name] is a 88 y.o. male who comes in today at the request of Dr. Gearldean Keepers for planned Left L5-S1 Lumbar Interlaminar epidural steroid injection with fluoroscopic guidance.  The patient has failed conservative care including home exercise, medications, time and activity modification.  This injection will be diagnostic and hopefully therapeutic.  Please see requesting physician notes for further details and justification.   ROS Otherwise per HPI.  Assessment & Plan: Visit Diagnoses:    ICD-10-CM   1. Lumbar radiculopathy  M54.16 XR C-ARM NO REPORT    Epidural Steroid injection    methylPREDNISolone  acetate (DEPO-MEDROL ) injection 40 mg      Plan: No additional findings.   Meds & Orders:  Meds ordered this encounter  Medications   methylPREDNISolone  acetate (DEPO-MEDROL ) injection 40 mg    Orders Placed This Encounter  Procedures   XR C-ARM NO REPORT   Epidural Steroid injection    Follow-up: Return for visit to requesting provider as needed.   Procedures: No procedures performed  Lumbar Epidural Steroid Injection - Interlaminar Approach with Fluoroscopic Guidance  Patient: Jason [name]      Date of Birth: 1935/12/01 MRN: [id number] PCP: Shelvia Dick, MD      Visit Date: 12/08/2023   Universal Protocol:     Consent Given By: the patient  Position: PRONE  Additional Comments: Vital signs were monitored before and after the procedure. Patient was prepped and draped in the usual sterile fashion. The correct patient, procedure, and site was verified.   Injection Procedure Details:   Procedure diagnoses: Lumbar radiculopathy [M54.16]   Meds Administered:  Meds ordered this encounter  Medications    methylPREDNISolone  acetate (DEPO-MEDROL ) injection 40 mg     Laterality: Left  Location/Site:  L5-S1  Needle: 3.5 in., 20 ga. Tuohy  Needle Placement: Paramedian epidural  Findings:   -Comments: Excellent flow of contrast into the epidural space.  Procedure Details: Using a paramedian approach from the side mentioned above, the region overlying the inferior lamina was localized under fluoroscopic visualization and the soft tissues overlying this structure were infiltrated with 4 ml. of 1% Lidocaine without Epinephrine. The Tuohy needle was inserted into the epidural space using a paramedian approach.   The epidural space was localized using loss of resistance along with counter oblique bi-planar fluoroscopic views.  After negative aspirate for air, blood, and CSF, a 2 ml. volume of Isovue-250 was injected into the epidural space and the flow of contrast was observed. Radiographs were obtained for documentation purposes.    The injectate was administered into the level noted above.   Additional Comments:  The patient tolerated the procedure well Dressing: 2 x 2 sterile gauze and Band-Aid    Post-procedure details: Patient was observed during the procedure. Post-procedure instructions were reviewed.  Patient left the clinic in stable condition.   Clinical History: CLINICAL DATA:  Lumbar radiculopathy   EXAM: MRI LUMBAR SPINE WITHOUT CONTRAST   TECHNIQUE: Multiplanar, multisequence MR imaging of the lumbar spine was performed. No intravenous contrast was administered.   COMPARISON:  Lumbar spine October 31, 2023   FINDINGS: Segmentation:  Standard.   Alignment:  Physiologic.   Vertebrae:  No fracture, evidence  of discitis, or bone lesion.   Conus medullaris and cauda equina: Conus extends to the L1 level. Conus and cauda equina appear normal.   Paraspinal and other soft tissues:   Disc levels:   T12 - L1 No disc protrusion. No foraminal stenosis. No central  canal stenosis.   L1 - L2 medium-sized central, left paracentral disc herniation flattening and compression of the thecal sac and encroaching the left neural foramina   L2 - L3 desiccated disc material with a small central left paracentral spondylitic bulging disc left lateral disc herniation encroaching the left neural foramina   L3 - L4 narrowing of the disc space with desiccated disc material with a central, right and left paracentral bulging disc flattening the thecal sac without disc herniation or canal stenosis   L4 - L5 desiccated disc material without disc herniation or canal stenosis or foraminal narrowing with hypertrophic degenerative facet changes   L5 - S1 desiccated disc material without disc herniation with hypertrophic degenerative facet changes   IMPRESSION: *L1-2 medium-sized central, left paracentral disc herniation flattening and compression of the thecal sac and encroaching the left neural foramina. *L2-3 small central, left paracentral spondylitic bulging disc left lateral disc herniation encroaching the left neural foramina. *L3-4 central, right and left paracentral bulging disc flattening the thecal sac without disc herniation or canal stenosis. *L4-5 desiccated disc material without disc herniation or canal stenosis or foraminal narrowing with hypertrophic degenerative facet changes. *L5-S1 desiccated disc material without disc herniation with hypertrophic degenerative facet changes.     Electronically Signed   By: Fredrich Jefferson M.D.   On: 11/16/2023 13:50     Objective:  VS:  HT:    WT:   BMI:     BP:(!) 155/90  HR:91bpm  TEMP: ( )  RESP:  Physical Exam Vitals and nursing note reviewed.  Constitutional:      General: He is not in acute distress.    Appearance: Normal appearance. He is not ill-appearing.  HENT:     Head: Normocephalic and atraumatic.     Right Ear: External ear normal.     Left Ear: External ear normal.     Nose: No  congestion.  Eyes:     Extraocular Movements: Extraocular movements intact.  Cardiovascular:     Rate and Rhythm: Normal rate.     Pulses: Normal pulses.  Pulmonary:     Effort: Pulmonary effort is normal. No respiratory distress.  Abdominal:     General: There is no distension.     Palpations: Abdomen is soft.  Musculoskeletal:        General: No tenderness or signs of injury.     Cervical back: Neck supple.     Right lower leg: No edema.     Left lower leg: No edema.     Comments: Patient has good distal strength without clonus.  Skin:    Findings: No erythema or rash.  Neurological:     General: No focal deficit present.     Mental Status: He is alert and oriented to person, place, and time.     Sensory: No sensory deficit.     Motor: No weakness or abnormal muscle tone.     Coordination: Coordination normal.  Psychiatric:        Mood and Affect: Mood normal.        Behavior: Behavior normal.      Imaging: No results found.

## 2023-12-21 ENCOUNTER — Encounter: Payer: Self-pay | Admitting: Family Medicine

## 2023-12-21 ENCOUNTER — Ambulatory Visit (INDEPENDENT_AMBULATORY_CARE_PROVIDER_SITE_OTHER): Admitting: Family Medicine

## 2023-12-21 VITALS — BP 148/80 | HR 85 | Temp 97.9°F | Ht 71.0 in | Wt 233.8 lb

## 2023-12-21 DIAGNOSIS — M7541 Impingement syndrome of right shoulder: Secondary | ICD-10-CM

## 2023-12-21 DIAGNOSIS — M67911 Unspecified disorder of synovium and tendon, right shoulder: Secondary | ICD-10-CM

## 2023-12-21 DIAGNOSIS — M25511 Pain in right shoulder: Secondary | ICD-10-CM

## 2023-12-21 DIAGNOSIS — G8929 Other chronic pain: Secondary | ICD-10-CM | POA: Diagnosis not present

## 2023-12-21 MED ORDER — TRIAMCINOLONE ACETONIDE 40 MG/ML IJ SUSP
40.0000 mg | Freq: Once | INTRAMUSCULAR | Status: AC
  Administered 2023-12-21: 40 mg via INTRA_ARTICULAR

## 2023-12-21 NOTE — Addendum Note (Signed)
 Addended by: Terris Fickle D on: 12/21/2023 10:40 AM   Modules accepted: Orders

## 2023-12-21 NOTE — Progress Notes (Signed)
 OFFICE VISIT  12/21/2023  CC:  Chief Complaint  Patient presents with   Shoulder Pain    Right; ongoing, had XR on 4/2  (poss chronic calcific tendinitis or bursitis)    Patient is a 88 y.o. male who presents for right shoulder pain.  INTERIM HX: He has right shoulder pain continues.  Waxes and wanes in severity.  Usually not painful at rest but with reaching up and out he gets significant pain over the right acromion and proximal deltoid.  No neck pain, no radiating arm pain, no paresthesias. I did a right shoulder subacromial injection approximately 5 months ago and he got good, lasting relief.  Of note, he got an L5-S1 epidural injection on 12/08/2023 by Dr. Bridget Campion and this did significantly help his radicular type left upper leg pain.  Past Medical History:  Diagnosis Date   Anxiety and depression    Ascending aortic aneurysm (HCC) 01/22/2022   Bilateral shoulder pain, unspecified chronicity    XR 10/2023-->+Bilat RC tendonopathy+AC arth and mild GH arth on R.  Severe GH arth/RC arthropathy on L   CAD in native artery 01/22/2022   Chronic renal insufficiency, stage 3 (moderate) (HCC)    Diabetes mellitus without complication (HCC)    Gait instability 01/22/2022   GERD (gastroesophageal reflux disease)    Gout    Hypercholesterolemia    Hypertension    Lumbar compression fracture (HCC)    L2, 20%, chronic (imaging 2024).  DEXA -1.0 04/2023   Orthostatic hypotension    OSA (obstructive sleep apnea)    Osteoarthritis of left hip    + troch burs   Prostate cancer (HCC)    1990-->prostatectomy, released from urol x many years   Urinary incontinence     Past Surgical History:  Procedure Laterality Date   CHOLECYSTECTOMY, LAPAROSCOPIC     1992   DEXA     T score -1.0 Sept 2024   ROBOT ASSISTED LAPAROSCOPIC RADICAL PROSTATECTOMY     1999   ROTATOR CUFF REPAIR     Left 1991, right 1997   TOTAL KNEE ARTHROPLASTY     bilat 2004   TRANSTHORACIC ECHOCARDIOGRAM      11/24/22, EF nl, +LVH with no WMA, grd I DD, valves ok (no amyloid changes)    Outpatient Medications Prior to Visit  Medication Sig Dispense Refill   allopurinol  (ZYLOPRIM ) 300 MG tablet Take 1 tablet (300 mg total) by mouth daily. 90 tablet 1   aspirin EC 81 MG tablet Take 1 tablet every day by oral route.     atorvastatin  (LIPITOR) 40 MG tablet TAKE 1 TABLET BY MOUTH EVERY DAY 90 tablet 1   Continuous Glucose Receiver (FREESTYLE LIBRE 3 READER) DEVI Inject 1 each into the skin every 14 (fourteen) days. 2 each 2   Continuous Glucose Sensor (FREESTYLE LIBRE 3 PLUS SENSOR) MISC INJECT 1 EACH INTO THE SKIN EVERY 14 (FOURTEEN) DAYS. 2 each 2   fluticasone  (FLONASE ) 50 MCG/ACT nasal spray Place 1-2 sprays into both nostrils daily. 16 g 6   glimepiride  (AMARYL ) 1 MG tablet Take 1 tablet (1 mg total) by mouth daily with breakfast. 90 tablet 1   glucose blood test strip E11.9 Use to test blood sugar twice daily or as needed 100 each 12   ipratropium (ATROVENT ) 0.03 % nasal spray PLACE 2 SPRAYS INTO BOTH NOSTRILS EVERY 12 (TWELVE) HOURS. USE 15 TO 30 MIN PRIOR TO BREAKFAST AND SUPPER 30 mL 6   losartan  (COZAAR ) 25 MG tablet  Take 1 tablet (25 mg total) by mouth at bedtime. 90 tablet 2   metFORMIN  (GLUCOPHAGE ) 500 MG tablet TAKE 2 TABLETS (1,000 MG TOTAL) BY MOUTH 2 (TWO) TIMES DAILY WITH A MEAL. (Patient taking differently: Take 500 mg by mouth 2 (two) times daily with a meal. TAKE 2 TABLETS (1,000 MG TOTAL) BY MOUTH 2 (TWO) TIMES DAILY WITH A MEAL.) 360 tablet 0   metoprolol  tartrate (LOPRESSOR ) 25 MG tablet TAKE 1 TABLET BY MOUTH TWICE A DAY (Patient taking differently: Take 12.5 mg by mouth 2 (two) times daily.) 180 tablet 3   midodrine  (PROAMATINE ) 10 MG tablet TAKE 1 TABLET BEFORE GETTING OUT OF BED OR SOON AFTER, 1 TABLET AT 12PM, 1 TABLET AROUND 4PM (Patient taking differently: TAKE 1 TABLET BEFORE GETTING OUT OF BED OR SOON AFTER, 1 TABLET AT 12PM.) 270 tablet 2   Multiple Vitamins-Minerals (ICAPS  AREDS 2 PO) TAKE 1 CAPSULE BY MOUTH TWICE A DAY FOR MACULAR DEGENERATION     omeprazole  (PRILOSEC) 20 MG capsule Take 1 capsule (20 mg total) by mouth daily. 90 capsule 1   pioglitazone  (ACTOS ) 15 MG tablet Take 1 tablet (15 mg total) by mouth daily. 90 tablet 1   tirzepatide  (MOUNJARO ) 5 MG/0.5ML Pen Inject 5 mg into the skin once a week. 6 mL 3   venlafaxine  (EFFEXOR ) 75 MG tablet TAKE 1 TABLET BY MOUTH EVERY DAY 90 tablet 1   No facility-administered medications prior to visit.    Allergies  Allergen Reactions   Lisinopril Cough, Swelling and Other (See Comments)    Review of Systems As per HPI  PE:    12/21/2023   10:01 AM 12/21/2023    9:51 AM 12/08/2023    9:57 AM  Vitals with BMI  Height  5\' 11"    Weight  233 lbs 13 oz   BMI  32.62   Systolic 148 151 161  Diastolic 80 88 90  Pulse  85 91     Physical Exam  Gen: Alert, well appearing.  Patient is oriented to person, place, time, and situation. AFFECT: pleasant, lucid thought and speech. Right shoulder with mild discomfort to palpation around the acromion.  No AC joint tenderness.  He can abduct to about 80 degrees without significant pain but then cannot get past 90 degrees.  Hawkins positive.  Scarf sign equivocal. He has good external and internal range of motion.  Arm strength 5 out of 5 proximally and distally.  LABS:  Last metabolic panel Lab Results  Component Value Date   GLUCOSE 143 (H) 08/05/2023   NA 138 10/17/2023   K 4.2 10/17/2023   CL 100 10/17/2023   CO2 27 (A) 10/17/2023   BUN 25 (A) 10/17/2023   CREATININE 1.5 (A) 10/17/2023   EGFR 46 10/17/2023   CALCIUM  10.0 10/17/2023   PROT 6.9 05/06/2023   ALBUMIN 4.5 10/17/2023   BILITOT 0.5 05/06/2023   ALKPHOS 94 10/17/2023   AST 21 10/17/2023   ALT 34 10/17/2023   Lab Results  Component Value Date   HGBA1C 6.1 10/17/2023   IMPRESSION AND PLAN:  Chronic right shoulder pain. He has rotator cuff tendinopathy with impingement. He got  significant improvement with an injection about 6 months ago. We elected to do another steroid injection today.  Ultrasound-guided injection is preferred based on studies that show increased duration, increased effect, greater accuracy, decreased procedural pain, increased response rate, and decreased cost with ultrasound-guided versus blind injection. Procedure: Real-time ultrasound guided injection of right subacromial  space. Device: GE [employer] informed consent obtained.  Timeout conducted.  No overlying erythema, induration, or other signs of local infection. After sterile prep with Betadine, injected 3 mL of 1% plain lidocaine followed by a mixture of 3 mL 1% lidocaine and 40 mg Kenalog .  Injectate seen filling subacromial bursa. Patient tolerated the procedure well.  No immediate complications.  Post-injection care discussed. Advised to call if fever/chills, erythema, drainage, or persistent bleeding.  Impression: Technically successful ultrasound-guided injection.   An After Visit Summary was printed and given to the patient.  FOLLOW UP: Return if symptoms worsen or fail to improve.  Signed:  Arletha Lady, MD           12/21/2023

## 2024-01-09 ENCOUNTER — Other Ambulatory Visit: Payer: Self-pay | Admitting: Family Medicine

## 2024-01-09 DIAGNOSIS — K219 Gastro-esophageal reflux disease without esophagitis: Secondary | ICD-10-CM

## 2024-01-10 ENCOUNTER — Other Ambulatory Visit: Payer: Self-pay | Admitting: Family Medicine

## 2024-01-10 DIAGNOSIS — K219 Gastro-esophageal reflux disease without esophagitis: Secondary | ICD-10-CM

## 2024-01-10 NOTE — Telephone Encounter (Unsigned)
 Copied from CRM [id number]. Topic: Clinical - Medication Refill >> Jan 10, 2024 11:47 AM Jenice Mitts wrote: Medication:   omeprazole  (PRILOSEC) 20 MG capsule Has the patient contacted their pharmacy? Yes (Agent: If no, request that the patient contact the pharmacy for the refill. If patient does not wish to contact the pharmacy document the reason why and proceed with request.) (Agent: If yes, when and what did the pharmacy advise?)  This is the patient's preferred pharmacy:  [id number] - [place], [place] - [address] [place] [address] Phone: [phone] Fax: [phone]  Is this the correct pharmacy for this prescription? Yes If no, delete pharmacy and type the correct one.   Has the prescription been filled recently? No  Is the patient out of the medication? Yes  Has the patient been seen for an appointment in the last year OR does the patient have an upcoming appointment? Yes  Can we respond through MyChart? Yes  Agent: Please be advised that Rx refills may take up to 3 business days. We ask that you follow-up with your pharmacy.

## 2024-01-18 ENCOUNTER — Ambulatory Visit (INDEPENDENT_AMBULATORY_CARE_PROVIDER_SITE_OTHER): Admitting: Family Medicine

## 2024-01-18 ENCOUNTER — Encounter: Payer: Self-pay | Admitting: Family Medicine

## 2024-01-18 VITALS — BP 129/84 | HR 93 | Temp 97.8°F | Ht 71.0 in | Wt 225.6 lb

## 2024-01-18 DIAGNOSIS — M75102 Unspecified rotator cuff tear or rupture of left shoulder, not specified as traumatic: Secondary | ICD-10-CM

## 2024-01-18 DIAGNOSIS — M12812 Other specific arthropathies, not elsewhere classified, left shoulder: Secondary | ICD-10-CM

## 2024-01-18 DIAGNOSIS — M25512 Pain in left shoulder: Secondary | ICD-10-CM

## 2024-01-18 NOTE — Progress Notes (Signed)
 OFFICE VISIT  01/18/2024  CC:  Chief Complaint  Patient presents with   Shoulder Pain    Left; had XR done 4/2    Patient is a 88 y.o. male who presents for left shoulder ache.  HPI: Onset of pain in the anterior shoulder region a couple of days ago.  No preceding trauma or overuse.  Pain worse with movement or when he lays on it at night.  No neck pain or radiating pain down the arm or paresthesias in the arm.  Left shoulder radiographs 11/16/2023 showed severe osteoarthrosis with significant narrowing of the glenohumeral joint space consistent with chronic rotator cuff degeneration.  I did a right shoulder subacromial injection on 12/21/2023--> this helped greatly.  He can essentially move the shoulder whichever way he wants and it does not cause any pain.  Past Medical History:  Diagnosis Date   Anxiety and depression    Ascending aortic aneurysm (HCC) 01/22/2022   Bilateral shoulder pain, unspecified chronicity    XR 10/2023-->+Bilat RC tendonopathy+AC arth and mild GH arth on R.  Severe GH arth/RC arthropathy on L   CAD in native artery 01/22/2022   Chronic renal insufficiency, stage 3 (moderate) (HCC)    Diabetes mellitus without complication (HCC)    Gait instability 01/22/2022   GERD (gastroesophageal reflux disease)    Gout    Hypercholesterolemia    Hypertension    Lumbar compression fracture (HCC)    L2, 20%, chronic (imaging 2024).  DEXA -1.0 04/2023   Orthostatic hypotension    OSA (obstructive sleep apnea)    Osteoarthritis of left hip    + troch burs   Prostate cancer (HCC)    1990-->prostatectomy, released from urol x many years   Urinary incontinence     Past Surgical History:  Procedure Laterality Date   CHOLECYSTECTOMY, LAPAROSCOPIC     1992   DEXA     T score -1.0 Sept 2024   ROBOT ASSISTED LAPAROSCOPIC RADICAL PROSTATECTOMY     1999   ROTATOR CUFF REPAIR     Left 1991, right 1997   TOTAL KNEE ARTHROPLASTY     bilat 2004   TRANSTHORACIC  ECHOCARDIOGRAM     11/24/22, EF nl, +LVH with no WMA, grd I DD, valves ok (no amyloid changes)    Outpatient Medications Prior to Visit  Medication Sig Dispense Refill   allopurinol  (ZYLOPRIM ) 300 MG tablet Take 1 tablet (300 mg total) by mouth daily. 90 tablet 1   aspirin EC 81 MG tablet Take 1 tablet every day by oral route.     atorvastatin  (LIPITOR) 40 MG tablet TAKE 1 TABLET BY MOUTH EVERY DAY 90 tablet 1   Continuous Glucose Receiver (FREESTYLE LIBRE 3 READER) DEVI Inject 1 each into the skin every 14 (fourteen) days. 2 each 2   Continuous Glucose Sensor (FREESTYLE LIBRE 3 PLUS SENSOR) MISC INJECT 1 EACH INTO THE SKIN EVERY 14 (FOURTEEN) DAYS. 2 each 2   fluticasone  (FLONASE ) 50 MCG/ACT nasal spray Place 1-2 sprays into both nostrils daily. 16 g 6   glimepiride  (AMARYL ) 1 MG tablet Take 1 tablet (1 mg total) by mouth daily with breakfast. 90 tablet 1   glucose blood test strip E11.9 Use to test blood sugar twice daily or as needed 100 each 12   ipratropium (ATROVENT ) 0.03 % nasal spray PLACE 2 SPRAYS INTO BOTH NOSTRILS EVERY 12 (TWELVE) HOURS. USE 15 TO 30 MIN PRIOR TO BREAKFAST AND SUPPER 30 mL 6   losartan  (COZAAR ) 25  MG tablet Take 1 tablet (25 mg total) by mouth at bedtime. 90 tablet 2   metFORMIN  (GLUCOPHAGE ) 500 MG tablet TAKE 2 TABLETS (1,000 MG TOTAL) BY MOUTH 2 (TWO) TIMES DAILY WITH A MEAL. (Patient taking differently: Take 500 mg by mouth 2 (two) times daily with a meal. TAKE 2 TABLETS (1,000 MG TOTAL) BY MOUTH 2 (TWO) TIMES DAILY WITH A MEAL.) 360 tablet 0   metoprolol  tartrate (LOPRESSOR ) 25 MG tablet TAKE 1 TABLET BY MOUTH TWICE A DAY (Patient taking differently: Take 12.5 mg by mouth 2 (two) times daily.) 180 tablet 3   midodrine  (PROAMATINE ) 10 MG tablet TAKE 1 TABLET BEFORE GETTING OUT OF BED OR SOON AFTER, 1 TABLET AT 12PM, 1 TABLET AROUND 4PM (Patient taking differently: TAKE 1 TABLET BEFORE GETTING OUT OF BED OR SOON AFTER, 1 TABLET AT 12PM.) 270 tablet 2   Multiple  Vitamins-Minerals (ICAPS AREDS 2 PO) TAKE 1 CAPSULE BY MOUTH TWICE A DAY FOR MACULAR DEGENERATION     omeprazole  (PRILOSEC) 20 MG capsule TAKE 1 CAPSULE BY MOUTH EVERY DAY 90 capsule 1   pioglitazone  (ACTOS ) 15 MG tablet Take 1 tablet (15 mg total) by mouth daily. 90 tablet 1   tirzepatide  (MOUNJARO ) 5 MG/0.5ML Pen Inject 5 mg into the skin once a week. 6 mL 3   venlafaxine  (EFFEXOR ) 75 MG tablet TAKE 1 TABLET BY MOUTH EVERY DAY 90 tablet 1   No facility-administered medications prior to visit.    Allergies  Allergen Reactions   Lisinopril Cough, Swelling and Other (See Comments)    Review of Systems  As per HPI  PE:    01/18/2024    9:13 AM 12/21/2023   10:01 AM 12/21/2023    9:51 AM  Vitals with BMI  Height 5\' 11"   5\' 11"   Weight 225 lbs 10 oz  233 lbs 13 oz  BMI 31.48  32.62  Systolic 129 148 161  Diastolic 84 80 88  Pulse 93  85     Physical Exam  Gen: Alert, well appearing.  Patient is oriented to person, place, time, and situation. Left shoulder without deformity.  He has some mild tenderness to palpation around the anterolateral acromion on the left.  No tenderness over the [facility] joint or the biceps tendon.  He has the most pain with abduction and internal rotation, abduction limited to about 80 degrees, negative drop sign.  Hawkins positive Neer's equivocal.  Speeds negative.  Neurovascular intact.  LABS:  none  IMPRESSION AND PLAN:  #1 left shoulder pain, acute.  He does have a history of significant glenohumeral osteoarthritis on recent plain radiograph. Bedside [facility] ultrasound today shows complete full-thickness tear of the supraspinatus with retraction. Biceps tendon intact and normal on long and short views, no effusion in the sheath.  Subscap tendon thickened and with hypoechoic changes/loss of fibrillar architecture but without tear AC joint with distended capsule but no geyser sign.   Posterior glenohumeral joint without effusion.  He has degenerative rotator  cuff tear and arthropathy. Home exercises reviewed and handed to patient today. He will let me know early next week if things are getting any better.  If they are not then we will start formal physical therapy. If the pain is worsening then we can do steroid injection prior to starting physical therapy to see if he can participate better in PT.  An After Visit Summary was printed and given to the patient.  FOLLOW UP: Return if symptoms worsen or fail to improve.  Signed:  Arletha Lady, MD           01/18/2024

## 2024-01-18 NOTE — Patient Instructions (Signed)
 Rotator Cuff Tear Rehab After Surgery Ask your health care provider which exercises are safe for you. Do exercises exactly as told by your health care provider and adjust them as directed. It is normal to feel mild stretching, pulling, tightness, or discomfort as you do these exercises. Stop right away if you feel sudden pain or your pain gets worse. Do not begin these exercises until told by your health care provider. Stretching and range-of-motion exercises These exercises warm up your muscles and joints and improve the movement and flexibility of your shoulder. These exercises also help to relieve pain. Shoulder pendulum In this exercise, you let the injured arm dangle toward the floor and then swing it like a clock pendulum. Stand near a table or counter that you can hold onto for balance. Bend forward at the waist and let your left / right arm hang straight down. Use your other arm to support you and help you stay balanced. Relax your left / right arm and shoulder muscles, and move your hips and your trunk so your left / right arm swings freely. Your arm should swing because of the motion of your body, not because you are using your arm or shoulder muscles. Keep moving your hips and trunk so your arm swings in the following directions, as told by your health care provider: Side to side. Forward and backward. In clockwise and counterclockwise circles. Slowly return to the starting position. Repeat __________ times, or for __________ seconds per direction. Complete this exercise __________ times a day. Shoulder flexion, seated This exercise is sometimes called table slides. In this exercise, you raise your arm in front of your body until you feel a stretch in your injured shoulder. Sit in a stable chair so your left / right forearm can rest on a flat surface. Your elbow should rest at a height that keeps your upper arm next to your body. Keeping your left / right shoulder relaxed, lean forward  at the waist and let your hand slide forward (flexion). Stop when you feel a stretch in your shoulder, or when you reach the angle that is recommended by your health care provider. Hold for __________ seconds. Slowly return to the starting position. Repeat __________ times. Complete this exercise __________ times a day. Shoulder flexion, standing In this exercise, you raise your arm in front of your body (flexion) until you feel a stretch in your injured shoulder. Stand and hold a broomstick, a cane, or a similar object. Place your hands a little more than shoulder-width apart on the object. Your left / right hand should be palm-up, and your other hand should be palm-down. Keep your elbow straight and your shoulder muscles relaxed. Push the stick up with your healthy arm to raise your left / right arm in front of your body, and then over your head until you feel a stretch in your shoulder. Avoid shrugging your shoulder while you raise your arm. Keep your shoulder blade tucked down toward the middle of your back. Keep your left / right shoulder muscles relaxed. Hold for __________ seconds. Slowly return to the starting position. Repeat __________ times. Complete this exercise __________ times a day. Shoulder abduction, active-assisted You will need a stick, broom handle, or similar object to help you (assist) in doing this exercise. Lie on your back. This is the supine position. Hold a broomstick, a cane, or a similar object. Place your hands a little more than shoulder-width apart on the object. Your left / right hand should  be palm-up, and your other hand should be palm-down. Keeping your shoulder relaxed, push the stick to raise your left / right arm out to your side (abduction) and then over your head. Use your other hand to help move the stick. Stop when you feel a stretch in your shoulder, or when you reach the angle that is recommended by your health care provider. Avoid shrugging your  shoulder while you raise your arm. Keep your shoulder blade tucked down toward the middle of your back. Hold for __________ seconds. Slowly return to the starting position. Repeat __________ times. Complete this exercise __________ times a day. Shoulder flexion, active-assisted  Lie on your back. You may bend your knees for comfort. Hold a broomstick, a cane, or a similar object so that your hands are about shoulder-width apart. Your palms should face toward your feet. Raise your left / right arm over your head, then behind your head toward the floor (flexion). Use your other hand to help you do this (active-assisted). Stop when you feel a gentle stretch in your shoulder, or when you reach the angle that is recommended by your health care provider. Hold for __________ seconds. Use the stick and your other arm to help you return your left / right arm to the starting position. Repeat __________ times. Complete this exercise __________ times a day. External rotation  Sit in a stable chair without armrests, or stand up. Tuck a soft object, such as a folded towel or a small ball, under your left / right upper arm. Hold a broomstick, a cane, or a similar object with your palms face-down, toward the floor. Bend your elbows to a 90-degree angle (right angle), and keep your hands about shoulder-width apart. Straighten your healthy arm and push the stick across your body, toward your left / right side. Keep your left / right arm bent. This will rotate your left / right forearm away from your body (external rotation). Hold for __________ seconds. Slowly return to the starting position. Repeat __________ times. Complete this exercise __________ times a day. Strengthening exercises These exercises build strength and endurance in your shoulder. Endurance is the ability to use your muscles for a long time, even after they get tired. Do not start doing these exercises until your health care provider  approves. Shoulder flexion, isometric Stand or sit in a doorway, facing the door frame. Keep your left / right arm straight and make a gentle fist with your hand. Place your fist against the door frame. Only your fist should be touching the frame. Keep your upper arm at your side. Gently press your fist against the door frame, as if you are trying to raise your arm above your head (isometric shoulder flexion). Avoid shrugging your shoulder while you press your hand into the door frame. Keep your shoulder blade tucked down toward the middle of your back. Hold for __________ seconds. Slowly release the tension, and relax your muscles completely before you repeat the exercise. Repeat __________ times. Complete this exercise __________ times a day. Shoulder abduction, isometric  Stand or sit in a doorway. Your left / right arm should be closest to the door frame. Keep your left / right arm straight, and place the back of your hand against the door frame. Only your hand should be touching the frame. Keep the rest of your arm close to your side. Gently press the back of your hand against the door frame, as if you are trying to raise your arm out  to the side (isometric shoulder abduction). Avoid shrugging your shoulder while you press your hand into the door frame. Keep your shoulder blade tucked down toward the middle of your back. Hold for __________ seconds. Slowly release the tension, and relax your muscles completely before you repeat the exercise. Repeat __________ times. Complete this exercise __________ times a day. Internal rotation, isometric This is an exercise in which you press your palm against a door frame without moving your shoulder joint (isometric). Stand or sit in a doorway, facing the door frame. Bend your left / right elbow, and place the palm of your hand against the door frame. Only your palm should be touching the frame. Keep your upper arm at your side. Gently press your hand  against the door frame, as if you are trying to push your arm toward your abdomen (internal rotation). Gradually increase the pressure until you are pressing as hard as you can. Stop increasing the pressure if you feel shoulder pain. Avoid shrugging your shoulder while you press your hand into the door frame. Keep your shoulder blade tucked down toward the middle of your back. Hold for __________ seconds. Slowly release the tension, and relax your muscles completely before you repeat the exercise. Repeat __________ times. Complete this exercise __________ times a day. External rotation, isometric This is an exercise in which you press the back of your wrist against a door frame without moving your shoulder joint (isometric). Stand or sit in a doorway, facing the door frame. Bend your left / right elbow and place the back of your wrist against the door frame. Only the back of your wrist should be touching the frame. Keep your upper arm at your side. Gently press your wrist against the door frame, as if you are trying to push your arm away from your abdomen (external rotation). Gradually increase the pressure until you are pressing as hard as you can. Stop increasing the pressure if you feel pain. Avoid shrugging your shoulder while you press your wrist into the door frame. Keep your shoulder blade tucked down toward the middle of your back. Hold for __________ seconds. Slowly release the tension, and relax your muscles completely before you repeat the exercise. Repeat __________ times. Complete this exercise __________ times a day. This information is not intended to replace advice given to you by your health care provider. Make sure you discuss any questions you have with your health care provider. Document Revised: 01/31/2020 Document Reviewed: 01/31/2020 Elsevier Patient Education  2024 [employer].

## 2024-01-21 ENCOUNTER — Other Ambulatory Visit: Payer: Self-pay | Admitting: Family Medicine

## 2024-01-21 DIAGNOSIS — E119 Type 2 diabetes mellitus without complications: Secondary | ICD-10-CM

## 2024-02-13 ENCOUNTER — Telehealth: Payer: Self-pay

## 2024-02-13 DIAGNOSIS — M5416 Radiculopathy, lumbar region: Secondary | ICD-10-CM

## 2024-02-13 NOTE — Telephone Encounter (Signed)
 Patient called triage and wants another injection. Last injection done on 4/24 gave him 100% relief for two months.

## 2024-02-13 NOTE — Addendum Note (Signed)
 Addended by: ELDONNA GARDINER POUR on: 02/13/2024 02:35 PM   Modules accepted: Orders

## 2024-02-13 NOTE — Telephone Encounter (Signed)
 I put in for repeat but might do different level

## 2024-02-16 ENCOUNTER — Other Ambulatory Visit: Payer: Self-pay | Admitting: Family Medicine

## 2024-02-16 ENCOUNTER — Telehealth: Payer: Self-pay | Admitting: Physical Medicine and Rehabilitation

## 2024-02-16 DIAGNOSIS — E119 Type 2 diabetes mellitus without complications: Secondary | ICD-10-CM

## 2024-02-16 NOTE — Telephone Encounter (Signed)
 Patient called and wanted to know if he got approved for the injection. CB#[phone]

## 2024-03-01 ENCOUNTER — Other Ambulatory Visit: Payer: Self-pay

## 2024-03-01 ENCOUNTER — Ambulatory Visit: Admitting: Physical Medicine and Rehabilitation

## 2024-03-01 VITALS — BP 148/93 | HR 93

## 2024-03-01 DIAGNOSIS — M5416 Radiculopathy, lumbar region: Secondary | ICD-10-CM | POA: Diagnosis not present

## 2024-03-01 MED ORDER — METHYLPREDNISOLONE ACETATE 40 MG/ML IJ SUSP
40.0000 mg | Freq: Once | INTRAMUSCULAR | Status: AC
Start: 2024-03-01 — End: 2024-03-01
  Administered 2024-03-01: 40 mg

## 2024-03-01 NOTE — Progress Notes (Signed)
 Pain Scale   Average Pain 3 Patient advising he has chronic lower back radiating to his right leg. Patient advising his pain is worse in the AM. Patient advised to Dr. Eldonna, his pain radiates to left leg not right.        +Driver, -BT, -Dye Allergies.

## 2024-03-01 NOTE — Patient Instructions (Signed)

## 2024-03-01 NOTE — Progress Notes (Signed)
 Jason [name] - 88 y.o. male MRN [id number]  Date of birth: 21-Dec-1935  Office Visit Note: Visit Date: 03/01/2024 PCP: Candise Aleene VEAR, MD Referred by: Candise Aleene VEAR, MD  Subjective: Chief Complaint  Patient presents with   Lower Back - Pain   HPI:  Jason [name] is a 88 y.o. male who comes in today at the request of Duwaine Pouch, FNP for planned Left L4-5 Lumbar Interlaminar epidural steroid injection with fluoroscopic guidance.  The patient has failed conservative care including home exercise, medications, time and activity modification.  This injection will be diagnostic and hopefully therapeutic.  Please see requesting physician notes for further details and justification. Prior injection 100% relief for 2 months. Opted today to try L4-5 level to get more cranial spread and hope for longer relief.   ROS Otherwise per HPI.  Assessment & Plan: Visit Diagnoses:    ICD-10-CM   1. Lumbar radiculopathy  M54.16 XR C-ARM NO REPORT    Epidural Steroid injection    methylPREDNISolone  acetate (DEPO-MEDROL ) injection 40 mg      Plan: No additional findings.   Meds & Orders:  Meds ordered this encounter  Medications   methylPREDNISolone  acetate (DEPO-MEDROL ) injection 40 mg    Orders Placed This Encounter  Procedures   XR C-ARM NO REPORT   Epidural Steroid injection    Follow-up: Return for visit to requesting provider as needed.   Procedures: No procedures performed  Lumbar Epidural Steroid Injection - Interlaminar Approach with Fluoroscopic Guidance  Patient: Jason [name]      Date of Birth: 1936/08/08 MRN: [id number] PCP: Candise Aleene VEAR, MD      Visit Date: 03/01/2024   Universal Protocol:     Consent Given By: the patient  Position: PRONE  Additional Comments: Vital signs were monitored before and after the procedure. Patient was prepped and draped in the usual sterile fashion. The correct patient, procedure, and site was verified.   Injection Procedure  Details:   Procedure diagnoses: Lumbar radiculopathy [M54.16]   Meds Administered:  Meds ordered this encounter  Medications   methylPREDNISolone  acetate (DEPO-MEDROL ) injection 40 mg     Laterality: Left  Location/Site:  L4-5  Needle: 3.5 in., 20 ga. Tuohy  Needle Placement: Paramedian epidural  Findings:   -Comments: Excellent flow of contrast into the epidural space.  Procedure Details: Using a paramedian approach from the side mentioned above, the region overlying the inferior lamina was localized under fluoroscopic visualization and the soft tissues overlying this structure were infiltrated with 4 ml. of 1% Lidocaine without Epinephrine. The Tuohy needle was inserted into the epidural space using a paramedian approach.   The epidural space was localized using loss of resistance along with counter oblique bi-planar fluoroscopic views.  After negative aspirate for air, blood, and CSF, a 2 ml. volume of Isovue-250 was injected into the epidural space and the flow of contrast was observed. Radiographs were obtained for documentation purposes.    The injectate was administered into the level noted above.   Additional Comments:  The patient tolerated the procedure well Dressing: 2 x 2 sterile gauze and Band-Aid    Post-procedure details: Patient was observed during the procedure. Post-procedure instructions were reviewed.  Patient left the clinic in stable condition.   Clinical History: CLINICAL DATA:  Lumbar radiculopathy   EXAM: MRI LUMBAR SPINE WITHOUT CONTRAST   TECHNIQUE: Multiplanar, multisequence MR imaging of the lumbar spine was performed. No intravenous contrast was administered.   COMPARISON:  Lumbar spine October 31, 2023   FINDINGS: Segmentation:  Standard.   Alignment:  Physiologic.   Vertebrae:  No fracture, evidence of discitis, or bone lesion.   Conus medullaris and cauda equina: Conus extends to the L1 level. Conus and cauda equina appear  normal.   Paraspinal and other soft tissues:   Disc levels:   T12 - L1 No disc protrusion. No foraminal stenosis. No central canal stenosis.   L1 - L2 medium-sized central, left paracentral disc herniation flattening and compression of the thecal sac and encroaching the left neural foramina   L2 - L3 desiccated disc material with a small central left paracentral spondylitic bulging disc left lateral disc herniation encroaching the left neural foramina   L3 - L4 narrowing of the disc space with desiccated disc material with a central, right and left paracentral bulging disc flattening the thecal sac without disc herniation or canal stenosis   L4 - L5 desiccated disc material without disc herniation or canal stenosis or foraminal narrowing with hypertrophic degenerative facet changes   L5 - S1 desiccated disc material without disc herniation with hypertrophic degenerative facet changes   IMPRESSION: *L1-2 medium-sized central, left paracentral disc herniation flattening and compression of the thecal sac and encroaching the left neural foramina. *L2-3 small central, left paracentral spondylitic bulging disc left lateral disc herniation encroaching the left neural foramina. *L3-4 central, right and left paracentral bulging disc flattening the thecal sac without disc herniation or canal stenosis. *L4-5 desiccated disc material without disc herniation or canal stenosis or foraminal narrowing with hypertrophic degenerative facet changes. *L5-S1 desiccated disc material without disc herniation with hypertrophic degenerative facet changes.     Electronically Signed   By: Franky Chard M.D.   On: 11/16/2023 13:50     Objective:  VS:  HT:    WT:   BMI:     BP:(!) 148/93  HR:93bpm  TEMP: ( )  RESP:  Physical Exam Vitals and nursing note reviewed.  Constitutional:      General: He is not in acute distress.    Appearance: Normal appearance. He is obese. He is not  ill-appearing.  HENT:     Head: Normocephalic and atraumatic.     Right Ear: External ear normal.     Left Ear: External ear normal.     Nose: No congestion.  Eyes:     Extraocular Movements: Extraocular movements intact.  Cardiovascular:     Rate and Rhythm: Normal rate.     Pulses: Normal pulses.  Pulmonary:     Effort: Pulmonary effort is normal. No respiratory distress.  Abdominal:     General: There is no distension.     Palpations: Abdomen is soft.  Musculoskeletal:        General: No tenderness or signs of injury.     Cervical back: Neck supple.     Right lower leg: No edema.     Left lower leg: No edema.     Comments: Patient has good distal strength without clonus.  Skin:    Findings: No erythema or rash.  Neurological:     General: No focal deficit present.     Mental Status: He is alert and oriented to person, place, and time.     Cranial Nerves: No cranial nerve deficit.     Sensory: No sensory deficit.     Motor: No weakness or abnormal muscle tone.     Coordination: Coordination normal.     Gait: Gait abnormal.  Psychiatric:  Mood and Affect: Mood normal.        Behavior: Behavior normal.      Imaging: No results found.

## 2024-03-01 NOTE — Procedures (Signed)
 Lumbar Epidural Steroid Injection - Interlaminar Approach with Fluoroscopic Guidance  Patient: Jason [name]      Date of Birth: 1936-05-14 MRN: [id number] PCP: Candise Aleene VEAR, MD      Visit Date: 03/01/2024   Universal Protocol:     Consent Given By: the patient  Position: PRONE  Additional Comments: Vital signs were monitored before and after the procedure. Patient was prepped and draped in the usual sterile fashion. The correct patient, procedure, and site was verified.   Injection Procedure Details:   Procedure diagnoses: Lumbar radiculopathy [M54.16]   Meds Administered:  Meds ordered this encounter  Medications   methylPREDNISolone  acetate (DEPO-MEDROL ) injection 40 mg     Laterality: Left  Location/Site:  L4-5  Needle: 3.5 in., 20 ga. Tuohy  Needle Placement: Paramedian epidural  Findings:   -Comments: Excellent flow of contrast into the epidural space.  Procedure Details: Using a paramedian approach from the side mentioned above, the region overlying the inferior lamina was localized under fluoroscopic visualization and the soft tissues overlying this structure were infiltrated with 4 ml. of 1% Lidocaine without Epinephrine. The Tuohy needle was inserted into the epidural space using a paramedian approach.   The epidural space was localized using loss of resistance along with counter oblique bi-planar fluoroscopic views.  After negative aspirate for air, blood, and CSF, a 2 ml. volume of Isovue-250 was injected into the epidural space and the flow of contrast was observed. Radiographs were obtained for documentation purposes.    The injectate was administered into the level noted above.   Additional Comments:  The patient tolerated the procedure well Dressing: 2 x 2 sterile gauze and Band-Aid    Post-procedure details: Patient was observed during the procedure. Post-procedure instructions were reviewed.  Patient left the clinic in stable condition.

## 2024-03-27 ENCOUNTER — Encounter: Payer: Self-pay | Admitting: Podiatry

## 2024-03-27 ENCOUNTER — Ambulatory Visit: Admitting: Podiatry

## 2024-03-27 ENCOUNTER — Ambulatory Visit (INDEPENDENT_AMBULATORY_CARE_PROVIDER_SITE_OTHER)

## 2024-03-27 DIAGNOSIS — M21619 Bunion of unspecified foot: Secondary | ICD-10-CM

## 2024-03-27 DIAGNOSIS — L600 Ingrowing nail: Secondary | ICD-10-CM | POA: Diagnosis not present

## 2024-03-27 DIAGNOSIS — M2031 Hallux varus (acquired), right foot: Secondary | ICD-10-CM | POA: Diagnosis not present

## 2024-03-27 DIAGNOSIS — M21611 Bunion of right foot: Secondary | ICD-10-CM

## 2024-03-27 MED ORDER — DOXYCYCLINE HYCLATE 100 MG PO TABS: 100.0000 mg | ORAL_TABLET | Freq: Two times a day (BID) | ORAL | 0 refills | Status: DC

## 2024-03-27 MED ORDER — MUPIROCIN 2 % EX OINT
1.0000 | TOPICAL_OINTMENT | Freq: Two times a day (BID) | CUTANEOUS | 2 refills | Status: DC
Start: 2024-03-27 — End: 2024-04-11

## 2024-03-27 NOTE — Progress Notes (Signed)
 Subjective:  Patient ID: Jason [name], male    DOB: 02-08-1936,  MRN: [id number]  Chief Complaint  Patient presents with   Toe Pain    Right foot great toenail painful. 8 pain x 3 weeks. 0 treatments. NIDDM A1C 6.1.     Discussed the use of AI scribe software for clinical note transcription with the patient, who gave verbal consent to proceed.  History of Present Illness He is an 88 year old male with diabetes who presents with right big toe pain.  He experiences persistent pain in the right big toe, particularly when touched, for the past couple of weeks. The pain is localized to the right corner and possibly extends along the edge of the toe. There is no injury, drainage, or bleeding. He suspects an ingrown toenail, having had similar issues in the past.  He has diabetes with a recent blood sugar level of 170 mg/dL and an J8r of 6.1, indicating good control. He is not on blood thinners.      Objective:    Physical Exam General: AAO x3, NAD  Dermatological: Nails appear to be hypertrophic, dystrophic with yellow discoloration.  On the right medial nail border there is incurvation of nail with edema erythema on the nail border but there is no drainage or pus or ascending cellulitis.  On the lateral aspect nail distally there is incurvation present nail border with granulation tissue present.  There is no drainage or pus.  Vascular: Dorsalis Pedis artery and Posterior Tibial artery pedal pulses are 2/4 bilateral with immedate capillary fill time.  There is no pain with calf compression, swelling, warmth, erythema.   Neruologic: Sensation decreased.  Musculoskeletal: Tenderness on the ingrown toenails.  Digital contractures present.  Gait: Unassisted, Nonantalgic.     No images are attached to the encounter.    Results LABS Blood Glucose: 170 (03/27/2024) Hemoglobin A1c: 6.1 (10/2023)    Assessment:   1. Bunion      Plan:  Patient was evaluated and treated and all  questions answered.  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 History of ingrown toenails requiring removal. -At this time, recommended partial nail removal without chemical matricectomy to the right medial hallux. Risks and complications were discussed with the patient for which they understand and  verbally consent to the procedure. Under sterile conditions a total of 3 mL of a mixture of 2% lidocaine plain and 0.5% Marcaine plain was infiltrated in a hallux block fashion. Once anesthetized, the skin was prepped in sterile fashion. A tourniquet was then applied. Next the medial border of the hallux nail border was sharply excised making sure to remove the entire offending nail border. Once the nail was  Removed, the area was debrided and the underlying skin was intact. The area was irrigated and hemostasis was obtained.  A dry sterile dressing was applied. After application of the dressing the tourniquet was removed and there is found to be an immediate capillary refill time to the digit. The patient tolerated the procedure well any complications. Post procedure instructions were discussed the patient for which he verbally understood. Follow-up in one week for nail check or sooner if any problems are to arise. Discussed signs/symptoms of worsening infection and directed to call the office immediately should any occur or go directly to the emergency room. In the meantime, encouraged to call the office with any questions, concerns, changes  symptoms. - Mupirocin  ointment. -Doxycycline  - Scheduled follow-up in two weeks to assess healing.  Left great toe ingrown toenail Early signs of ingrown toenail, less severe than the right toe. - Apply mupirocin  ointment. - Monitor for signs of worsening or infection.  Type 2 diabetes mellitus without complications Well-controlled with  recent A1c of 6.1. Current blood sugar around 170 mg/dL.   Return in about 2 weeks (around 04/10/2024) for NAIL CHECK, INGROWN TOENAIL.   Donnice JONELLE Fees DPM

## 2024-03-27 NOTE — Patient Instructions (Signed)
Soak Instructions- HAVE SOMEONE ELSE CHECK THE TEMPERATURE OF THE WATER    THE DAY AFTER THE PROCEDURE  Place 1/4 cup of epsom salts in a quart of warm tap water.  Submerge your foot or feet with outer bandage intact for the initial soak; this will allow the bandage to become moist and wet for easy lift off.  Once you remove your bandage, continue to soak in the solution for 20 minutes.  This soak should be done twice a day.  Next, remove your foot or feet from solution, blot dry the affected area and cover.  You may use a band aid large enough to cover the area or use gauze and tape.  Apply other medications to the area as directed by the doctor such as polysporin neosporin.  IF YOUR SKIN BECOMES IRRITATED WHILE USING THESE INSTRUCTIONS, IT IS OKAY TO SWITCH TO  WHITE VINEGAR AND WATER. Or you may use antibacterial soap and water to keep the toe clean  Monitor for any signs/symptoms of infection. Call the office immediately if any occur or go directly to the emergency room. Call with any questions/concerns.

## 2024-04-06 ENCOUNTER — Other Ambulatory Visit: Payer: Self-pay | Admitting: Family Medicine

## 2024-04-06 DIAGNOSIS — E1169 Type 2 diabetes mellitus with other specified complication: Secondary | ICD-10-CM

## 2024-04-06 DIAGNOSIS — F411 Generalized anxiety disorder: Secondary | ICD-10-CM

## 2024-04-07 ENCOUNTER — Other Ambulatory Visit: Payer: Self-pay | Admitting: Family Medicine

## 2024-04-07 DIAGNOSIS — E785 Hyperlipidemia, unspecified: Secondary | ICD-10-CM

## 2024-04-10 ENCOUNTER — Encounter: Payer: Self-pay | Admitting: Podiatry

## 2024-04-10 ENCOUNTER — Ambulatory Visit: Admitting: Podiatry

## 2024-04-10 VITALS — BP 142/84 | HR 88

## 2024-04-10 DIAGNOSIS — L600 Ingrowing nail: Secondary | ICD-10-CM

## 2024-04-10 NOTE — Progress Notes (Unsigned)
 Subjective: Chief Complaint  Patient presents with   Nail Problem    I'm not sure.  It looks ugly and it has hurt for two sold weeks.  The left one is fine.  Saw Dr. Aleene Hockey - several mos ago, will see him tomorrow; A1c - 37.63    88 year old male presents the office today above concerns.  States that he was having pain for couple days but the pain has not subsided.  Denies any drainage or pus.  Objective: AAO x3, NAD DP/PT pulses palpable bilaterally, CRT less than 3 seconds Status post partial nail avulsion right hallux.  This area has healed over and only a small scab still remains.  There is no drainage or pus.  There is no tenderness on exam today. No significant incurvation present left hallux toenail.  No edema, erythema. No pain with calf compression, swelling, warmth, erythema  Assessment: Status post partial nail avulsion  Plan: -All treatment options discussed with the patient including all alternatives, risks, complications.  -At this time the pain is resolved and there is no signs of infection.  The small amount of antibiotic ointment in the area followed by dressing daily with the area open at nighttime.  Monitor for any signs or symptoms of infection and/or reoccurrence. -Patient encouraged to call the office with any questions, concerns, change in symptoms.   Donnice JONELLE Fees DPM   --- Patient seen by provider and was checking out. Patient access advocate called RN to front due to patient complaining of dizziness. Upon arrival to patient BP was checked. 250 pm: 114/80 while sitting. Patient was given water. Patient only reports dizziness and history of orthostatic hypotension and having skipped his medication for this earlier today. He reports improvement in symptoms after drinking water and says I could be a little dehydrated. BP checked again at 258 pm: 102/78 while standing. Patient waited an additional 10 minutes and had BP rechecked: 122/80. Patient reports  resolution of symptoms. He was escorted out by RN and patient access advocate. Patient reported normally being unsteady on his feet but has a cane to assist with balance issues. Once in vehicle, RN asked again if patient was feeling better and how dizziness was. Patient reported feeling much better and fine now.

## 2024-04-11 ENCOUNTER — Other Ambulatory Visit

## 2024-04-12 ENCOUNTER — Ambulatory Visit: Admitting: Family Medicine

## 2024-04-17 ENCOUNTER — Encounter: Payer: Self-pay | Admitting: Family Medicine

## 2024-04-17 ENCOUNTER — Ambulatory Visit (INDEPENDENT_AMBULATORY_CARE_PROVIDER_SITE_OTHER): Admitting: Family Medicine

## 2024-04-17 VITALS — BP 125/77 | HR 90 | Temp 97.3°F | Ht 71.0 in | Wt 209.0 lb

## 2024-04-17 DIAGNOSIS — M25511 Pain in right shoulder: Secondary | ICD-10-CM

## 2024-04-17 DIAGNOSIS — E1121 Type 2 diabetes mellitus with diabetic nephropathy: Secondary | ICD-10-CM

## 2024-04-17 DIAGNOSIS — Z7985 Long-term (current) use of injectable non-insulin antidiabetic drugs: Secondary | ICD-10-CM | POA: Diagnosis not present

## 2024-04-17 DIAGNOSIS — E1169 Type 2 diabetes mellitus with other specified complication: Secondary | ICD-10-CM

## 2024-04-17 DIAGNOSIS — E7800 Pure hypercholesterolemia, unspecified: Secondary | ICD-10-CM

## 2024-04-17 DIAGNOSIS — G8929 Other chronic pain: Secondary | ICD-10-CM

## 2024-04-17 DIAGNOSIS — N1831 Chronic kidney disease, stage 3a: Secondary | ICD-10-CM

## 2024-04-17 DIAGNOSIS — Z7984 Long term (current) use of oral hypoglycemic drugs: Secondary | ICD-10-CM | POA: Diagnosis not present

## 2024-04-17 DIAGNOSIS — E119 Type 2 diabetes mellitus without complications: Secondary | ICD-10-CM

## 2024-04-17 DIAGNOSIS — E669 Obesity, unspecified: Secondary | ICD-10-CM

## 2024-04-17 DIAGNOSIS — I10 Essential (primary) hypertension: Secondary | ICD-10-CM | POA: Diagnosis not present

## 2024-04-17 DIAGNOSIS — Z1283 Encounter for screening for malignant neoplasm of skin: Secondary | ICD-10-CM

## 2024-04-17 LAB — POCT GLYCOSYLATED HEMOGLOBIN (HGB A1C)
HbA1c POC (<> result, manual entry): 6.1 % (ref 4.0–5.6)
HbA1c, POC (controlled diabetic range): 6.1 % (ref 0.0–7.0)
HbA1c, POC (prediabetic range): 6.1 % (ref 5.7–6.4)
Hemoglobin A1C: 6.1 % — AB (ref 4.0–5.6)

## 2024-04-17 MED ORDER — TIRZEPATIDE 7.5 MG/0.5ML ~~LOC~~ SOAJ
7.5000 mg | SUBCUTANEOUS | Status: DC
Start: 2024-04-17 — End: 2024-06-21

## 2024-04-17 MED ORDER — METFORMIN HCL 500 MG PO TABS: 500.0000 mg | ORAL_TABLET | Freq: Two times a day (BID) | ORAL | Status: DC

## 2024-04-17 MED ORDER — FREESTYLE LIBRE 3 PLUS SENSOR MISC: 6 refills | Status: AC

## 2024-04-17 NOTE — Progress Notes (Signed)
 OFFICE VISIT  04/17/2024  CC:  Chief Complaint  Patient presents with   Medical Management of Chronic Issues    Pt is fasting    Patient is a 88 y.o. male who presents for follow-up diabetes, hypertension, hyperlipidemia, chronic renal insufficiency stage III, and chronic right shoulder pain. #1 diabetes with nephropathy, well-controlled. Most recent hemoglobin A1c on 10/17/2023 was 6.1%. Urine did show microalbuminuria.  He is going to see an endocrinologist next week and I anticipate that the urine microalbumin/creatinine will be repeated. Continue glimepiride  1 mg daily, metformin  1000 mg twice daily, pioglitazone  15 mg daily, and Mounjaro  5 mg weekly.   #2 hypertension, history of orthostatic hypotension. Doing well on losartan  25 mg a day, Lopressor  25 mg twice a day, and midodrine  10 mg 3 times a day.  He has hydralazine  25 mg tabs to take as needed significant elevation of blood pressure.   3.  Chronic renal insufficiency stage III.  Proteinuria recently detected on testing at the [facility].  Anticipate the urine microalbumin/creatinine will be repeated at his upcoming endocrinologist visit. Serum creatinine was stable at 1.47 on 10/17/2023. He is on losartan  25 mg a day.  INTERIM HX: Jason [name] is feeling well. He still has some orthostatic dizziness on and off. Blood pressure normal at home this morning.  He has been seeing an endocrinologist through the [facility]. His glimepiride  was stopped.  His metformin  XR was decreased to 500 mg twice a day.  He was continued on his Actos  15 mg a day and just recently his Mounjaro  was increased to 7.5 mg weekly.  He asks for a dermatology referral today for general skin cancer screening.  He says both shoulders feel fine lately. He is doing home exercises.   Past Medical History:  Diagnosis Date   Anxiety and depression    Ascending aortic aneurysm (HCC) 01/22/2022   Bilateral shoulder pain, unspecified chronicity    XR 10/2023-->+Bilat RC  tendonopathy+AC arth and mild GH arth on R.  Severe GH arth/RC arthropathy on L   CAD in native artery 01/22/2022   Chronic renal insufficiency, stage 3 (moderate) (HCC)    Diabetes mellitus without complication (HCC)    Gait instability 01/22/2022   GERD (gastroesophageal reflux disease)    Gout    Hypercholesterolemia    Hypertension    Lumbar compression fracture (HCC)    L2, 20%, chronic (imaging 2024).  DEXA -1.0 04/2023   Orthostatic hypotension    OSA (obstructive sleep apnea)    Osteoarthritis of left hip    + troch burs   Prostate cancer (HCC)    1990-->prostatectomy, released from urol x many years   Urinary incontinence     Past Surgical History:  Procedure Laterality Date   CHOLECYSTECTOMY, LAPAROSCOPIC     1992   DEXA     T score -1.0 Sept 2024   ROBOT ASSISTED LAPAROSCOPIC RADICAL PROSTATECTOMY     1999   ROTATOR CUFF REPAIR     Left 1991, right 1997   TOTAL KNEE ARTHROPLASTY     bilat 2004   TRANSTHORACIC ECHOCARDIOGRAM     11/24/22, EF nl, +LVH with no WMA, grd I DD, valves ok (no amyloid changes)    Outpatient Medications Prior to Visit  Medication Sig Dispense Refill   allopurinol  (ZYLOPRIM ) 300 MG tablet Take 1 tablet (300 mg total) by mouth daily. 90 tablet 1   aspirin EC 81 MG tablet Take 1 tablet every day by oral route.  atorvastatin  (LIPITOR) 40 MG tablet TAKE 1 TABLET BY MOUTH EVERY DAY 30 tablet 0   Continuous Glucose Receiver (FREESTYLE LIBRE 3 READER) DEVI Inject 1 each into the skin every 14 (fourteen) days. 2 each 2   Continuous Glucose Sensor (FREESTYLE LIBRE 3 SENSOR) MISC INJECT 1 EACH INTO THE SKIN EVERY 14 (FOURTEEN) DAYS. 2 each 2   fluticasone  (FLONASE ) 50 MCG/ACT nasal spray Place 1-2 sprays into both nostrils daily. 16 g 6   glucose blood test strip E11.9 Use to test blood sugar twice daily or as needed 100 each 12   ipratropium (ATROVENT ) 0.03 % nasal spray PLACE 2 SPRAYS INTO BOTH NOSTRILS EVERY 12 (TWELVE) HOURS. USE 15 TO 30 MIN  PRIOR TO BREAKFAST AND SUPPER 30 mL 6   metFORMIN  (GLUCOPHAGE ) 500 MG tablet TAKE 2 TABLETS (1,000 MG TOTAL) BY MOUTH 2 (TWO) TIMES DAILY WITH A MEAL. 360 tablet 0   metoprolol  tartrate (LOPRESSOR ) 25 MG tablet TAKE 1 TABLET BY MOUTH TWICE A DAY (Patient taking differently: Take 12.5 mg by mouth 2 (two) times daily.) 180 tablet 3   midodrine  (PROAMATINE ) 10 MG tablet TAKE 1 TABLET BEFORE GETTING OUT OF BED OR SOON AFTER, 1 TABLET AT 12PM, 1 TABLET AROUND 4PM (Patient taking differently: TAKE 1 TABLET BEFORE GETTING OUT OF BED OR SOON AFTER, 1 TABLET AT 12PM.) 270 tablet 2   Multiple Vitamins-Minerals (ICAPS AREDS 2 PO) TAKE 1 CAPSULE BY MOUTH TWICE A DAY FOR MACULAR DEGENERATION     omeprazole  (PRILOSEC) 20 MG capsule TAKE 1 CAPSULE BY MOUTH EVERY DAY 90 capsule 1   pioglitazone  (ACTOS ) 15 MG tablet TAKE 1 TABLET (15 MG TOTAL) BY MOUTH DAILY. 30 tablet 0   tirzepatide  (MOUNJARO ) 5 MG/0.5ML Pen Inject 5 mg into the skin once a week. 6 mL 3   venlafaxine  (EFFEXOR ) 75 MG tablet TAKE 1 TABLET BY MOUTH EVERY DAY 30 tablet 0   No facility-administered medications prior to visit.    Allergies  Allergen Reactions   Lisinopril Cough, Swelling and Other (See Comments)    Review of Systems As per HPI  PE:    04/17/2024    9:51 AM 04/10/2024    2:38 PM 03/01/2024    1:07 PM  Vitals with BMI  Height 5' 11    Weight 209 lbs    BMI 29.16    Systolic 125 142 851  Diastolic 77 84 93  Pulse 90 88 93    Physical Exam  Gen: Alert, well appearing.  Patient is oriented to person, place, time, and situation. AFFECT: pleasant, lucid thought and speech. CV: RRR, no m/r/g.   LUNGS: CTA bilat, nonlabored resps, good aeration in all lung fields. EXT: no clubbing or cyanosis.  no edema.  Foot exam -  no swelling, tenderness or skin or vascular lesions. Color and temperature is normal. Sensation is intact. Peripheral pulses are palpable. Toenails are normal.   LABS:  Last CBC Lab Results  Component  Value Date   WBC 12.5 10/17/2023   HGB 14.6 10/17/2023   HCT 45 10/17/2023   MCV 90.3 05/06/2023   MCH 29.0 05/06/2023   RDW 13.8 05/06/2023   PLT 276 05/06/2023   Last metabolic panel Lab Results  Component Value Date   GLUCOSE 143 (H) 08/05/2023   NA 138 10/17/2023   K 4.2 10/17/2023   CL 100 10/17/2023   CO2 27 (A) 10/17/2023   BUN 25 (A) 10/17/2023   CREATININE 1.5 (A) 10/17/2023   EGFR 46  10/17/2023   CALCIUM  10.0 10/17/2023   PROT 6.9 05/06/2023   ALBUMIN 4.5 10/17/2023   BILITOT 0.5 05/06/2023   ALKPHOS 94 10/17/2023   AST 21 10/17/2023   ALT 34 10/17/2023   Last lipids Lab Results  Component Value Date   CHOL 157 10/17/2023   HDL 55 10/17/2023   LDLCALC 74 10/17/2023   TRIG 142 10/17/2023   CHOLHDL 2.7 05/06/2023   Last hemoglobin A1c Lab Results  Component Value Date   HGBA1C 6.1 (A) 04/17/2024   HGBA1C 6.1 04/17/2024   HGBA1C 6.1 04/17/2024   HGBA1C 6.1 04/17/2024   IMPRESSION AND PLAN:  1 diabetes with nephropathy, well-controlled. Point-of-care hemoglobin A1c today here is 6.1%. Urine did show microalbuminuria.  He is going to see an endocrinologist next week he is followed by [facility] at the [facility] now. Current regimen of pioglitazone  15 mg a day, metformin  XR 500 mg twice a day, and Mounjaro  7.5 mg weekly. No changes today.  Feet exam normal today. Will repeat urine microalbumin/creatinine at next follow-up in 3 months (history of albuminuria).   #2 hypertension, history of orthostatic hypotension. Doing well on losartan  25 mg a day, Lopressor  HALF of a 25 mg tab twice a day, and midodrine  10 mg 3 times a day.  He has hydralazine  25 mg tabs to take as needed significant elevation of blood pressure. Monitor electrolytes and creatinine today.  3.  Chronic renal insufficiency stage III.  Proteinuria recently detected on testing at the [facility] several months ago. Will repeat urine microalbumin/creatinine in 3 months. He is on losartan  25 mg a  day Check renal function today.  #4 bilateral shoulder pain/osteoarthritis. Doing well lately.  5.  Skin cancer screening.  Per patient request will refer to dermatology today.  #6 hypercholesterolemia, LDL was 74 approximately 6 months ago. Doing well on Lipitor 40 mg a day. Lipid panel and hepatic panel today.  An After Visit Summary was printed and given to the patient.  FOLLOW UP: No follow-ups on file.  Signed:  Gerlene Hockey, MD           04/17/2024

## 2024-04-18 ENCOUNTER — Ambulatory Visit: Payer: Self-pay | Admitting: Family Medicine

## 2024-04-18 LAB — COMPREHENSIVE METABOLIC PANEL WITH GFR
AG Ratio: 1.6 (calc) (ref 1.0–2.5)
ALT: 15 U/L (ref 9–46)
AST: 21 U/L (ref 10–35)
Albumin: 4.1 g/dL (ref 3.6–5.1)
Alkaline phosphatase (APISO): 76 U/L (ref 35–144)
BUN: 24 mg/dL (ref 7–25)
CO2: 25 mmol/L (ref 20–32)
Calcium: 9.5 mg/dL (ref 8.6–10.3)
Chloride: 102 mmol/L (ref 98–110)
Creat: 1.15 mg/dL (ref 0.70–1.22)
Globulin: 2.6 g/dL (ref 1.9–3.7)
Glucose, Bld: 165 mg/dL — ABNORMAL HIGH (ref 65–99)
Potassium: 4.6 mmol/L (ref 3.5–5.3)
Sodium: 140 mmol/L (ref 135–146)
Total Bilirubin: 0.7 mg/dL (ref 0.2–1.2)
Total Protein: 6.7 g/dL (ref 6.1–8.1)
eGFR: 62 mL/min/1.73m2 (ref >=60)

## 2024-04-18 LAB — LIPID PANEL
Cholesterol: 144 mg/dL (ref <200)
HDL: 42 mg/dL (ref >=40)
LDL Cholesterol (Calc): 81 mg/dL
Non-HDL Cholesterol (Calc): 102 mg/dL (ref <130)
Total CHOL/HDL Ratio: 3.4 (calc) (ref <5.0)
Triglycerides: 109 mg/dL (ref <150)

## 2024-05-02 ENCOUNTER — Other Ambulatory Visit: Payer: Self-pay | Admitting: Family Medicine

## 2024-05-02 DIAGNOSIS — F411 Generalized anxiety disorder: Secondary | ICD-10-CM

## 2024-05-03 ENCOUNTER — Other Ambulatory Visit: Payer: Self-pay | Admitting: Family Medicine

## 2024-05-03 DIAGNOSIS — E1169 Type 2 diabetes mellitus with other specified complication: Secondary | ICD-10-CM

## 2024-05-04 ENCOUNTER — Ambulatory Visit: Admitting: Family Medicine

## 2024-05-06 ENCOUNTER — Other Ambulatory Visit: Payer: Self-pay | Admitting: Family Medicine

## 2024-05-06 DIAGNOSIS — E1169 Type 2 diabetes mellitus with other specified complication: Secondary | ICD-10-CM

## 2024-05-06 DIAGNOSIS — E785 Hyperlipidemia, unspecified: Secondary | ICD-10-CM

## 2024-05-07 ENCOUNTER — Ambulatory Visit (INDEPENDENT_AMBULATORY_CARE_PROVIDER_SITE_OTHER): Admitting: Family Medicine

## 2024-05-07 ENCOUNTER — Encounter: Payer: Self-pay | Admitting: Family Medicine

## 2024-05-07 ENCOUNTER — Other Ambulatory Visit: Payer: Self-pay | Admitting: Family Medicine

## 2024-05-07 VITALS — BP 127/76 | HR 94 | Temp 97.4°F | Ht 71.0 in | Wt 208.4 lb

## 2024-05-07 DIAGNOSIS — G8929 Other chronic pain: Secondary | ICD-10-CM

## 2024-05-07 DIAGNOSIS — M25511 Pain in right shoulder: Secondary | ICD-10-CM

## 2024-05-07 DIAGNOSIS — R2681 Unsteadiness on feet: Secondary | ICD-10-CM

## 2024-05-07 MED ORDER — TRIAMCINOLONE ACETONIDE 40 MG/ML IJ SUSP
40.0000 mg | Freq: Once | INTRAMUSCULAR | Status: AC
  Administered 2024-05-07: 40 mg via INTRA_ARTICULAR

## 2024-05-07 NOTE — Addendum Note (Signed)
 Addended by: FLETA CARE D on: 05/07/2024 11:13 AM   Modules accepted: Orders

## 2024-05-07 NOTE — Progress Notes (Signed)
 OFFICE VISIT  05/07/2024  CC:  Chief Complaint  Patient presents with   Shoulder Pain    Right; pt states pain is a little worse than last visit; last injection 12/21/23    Patient is a 88 y.o. male who presents for right shoulder pain.  HPI: Having gradual worsening of his chronic right shoulder pain.  Hurts to AB duct and reach out. Points to the lateral aspect under the acromion. He requests steroid injection today.  11/16/2023 right shoulder radiograph showed AC osteoarthrosis with subtle subacromial and calcific changes along the greater tuberosity that could correlate with chronic calcific tendinitis or bursitis.  I did a right shoulder subacromial injection on 12/21/2023--> this helped greatly.  Also did an injection 08/05/2023 and it helped greatly.  Review of systems: No fever, no rash.  No arm paresthesias or radiating neck pain. He does have chronic orthostatic hypotension/dizziness and feels unsteady when he walks. He is interested in physical therapy for this.  Past Medical History:  Diagnosis Date   Anxiety and depression    Ascending aortic aneurysm (HCC) 01/22/2022   Bilateral shoulder pain, unspecified chronicity    XR 10/2023-->+Bilat RC tendonopathy+AC arth and mild GH arth on R.  Severe GH arth/RC arthropathy on L   CAD in native artery 01/22/2022   Chronic renal insufficiency, stage 3 (moderate) (HCC)    Diabetes mellitus without complication (HCC)    Gait instability 01/22/2022   GERD (gastroesophageal reflux disease)    Gout    Hypercholesterolemia    Hypertension    Lumbar compression fracture (HCC)    L2, 20%, chronic (imaging 2024).  DEXA -1.0 04/2023   Orthostatic hypotension    OSA (obstructive sleep apnea)    Osteoarthritis of left hip    + troch burs   Prostate cancer (HCC)    1990-->prostatectomy, released from urol x many years   Urinary incontinence     Past Surgical History:  Procedure Laterality Date   CHOLECYSTECTOMY, LAPAROSCOPIC      1992   DEXA     T score -1.0 Sept 2024   ROBOT ASSISTED LAPAROSCOPIC RADICAL PROSTATECTOMY     1999   ROTATOR CUFF REPAIR     Left 1991, right 1997   TOTAL KNEE ARTHROPLASTY     bilat 2004   TRANSTHORACIC ECHOCARDIOGRAM     11/24/22, EF nl, +LVH with no WMA, grd I DD, valves ok (no amyloid changes)    Outpatient Medications Prior to Visit  Medication Sig Dispense Refill   allopurinol  (ZYLOPRIM ) 300 MG tablet Take 1 tablet (300 mg total) by mouth daily. 90 tablet 1   aspirin EC 81 MG tablet Take 1 tablet every day by oral route.     Continuous Glucose Receiver (FREESTYLE LIBRE 3 READER) DEVI Inject 1 each into the skin every 14 (fourteen) days. 2 each 2   Continuous Glucose Sensor (FREESTYLE LIBRE 3 PLUS SENSOR) MISC Change sensor every 15 days. 2 each 6   fluticasone  (FLONASE ) 50 MCG/ACT nasal spray Place 1-2 sprays into both nostrils daily. 16 g 6   glucose blood test strip E11.9 Use to test blood sugar twice daily or as needed 100 each 12   ipratropium (ATROVENT ) 0.03 % nasal spray PLACE 2 SPRAYS INTO BOTH NOSTRILS EVERY 12 (TWELVE) HOURS. USE 15 TO 30 MIN PRIOR TO BREAKFAST AND SUPPER 30 mL 6   metFORMIN  (GLUCOPHAGE ) 500 MG tablet Take 1 tablet (500 mg total) by mouth 2 (two) times daily with a meal.  metoprolol  tartrate (LOPRESSOR ) 25 MG tablet TAKE 1 TABLET BY MOUTH TWICE A DAY (Patient taking differently: Take 12.5 mg by mouth 2 (two) times daily.) 180 tablet 3   midodrine  (PROAMATINE ) 10 MG tablet TAKE 1 TABLET BEFORE GETTING OUT OF BED OR SOON AFTER, 1 TABLET AT 12PM, 1 TABLET AROUND 4PM (Patient taking differently: TAKE 1 TABLET BEFORE GETTING OUT OF BED OR SOON AFTER, 1 TABLET AT 12PM.) 270 tablet 2   Multiple Vitamins-Minerals (ICAPS AREDS 2 PO) TAKE 1 CAPSULE BY MOUTH TWICE A DAY FOR MACULAR DEGENERATION     omeprazole  (PRILOSEC) 20 MG capsule TAKE 1 CAPSULE BY MOUTH EVERY DAY 90 capsule 1   pioglitazone  (ACTOS ) 15 MG tablet TAKE 1 TABLET (15 MG TOTAL) BY MOUTH DAILY. 30  tablet 0   tirzepatide  (MOUNJARO ) 7.5 MG/0.5ML Pen Inject 7.5 mg into the skin once a week.     venlafaxine  (EFFEXOR ) 75 MG tablet TAKE 1 TABLET BY MOUTH EVERY DAY 90 tablet 0   No facility-administered medications prior to visit.    Allergies  Allergen Reactions   Lisinopril Cough, Swelling and Other (See Comments)    Review of Systems  As per HPI  PE:    05/07/2024    9:44 AM 04/17/2024    9:51 AM 04/10/2024    2:38 PM  Vitals with BMI  Height 5' 11 5' 11   Weight 208 lbs 6 oz 209 lbs   BMI 29.08 29.16   Systolic 127 125 857  Diastolic 76 77 84  Pulse 94 90 88     Physical Exam  General: Alert and well-appearing. He is cautious getting up onto the exam table. Right shoulder tender to palpation around the acromion.  No tenderness over the [facility] joint or biceps tendon. He has significant pain and limitation of abduction.  Supraspinatus sign is positive.  Negative drop sign.  He can internally rotate well. Positive Hawkins and Neer's.  Negative Yergason's and speeds.   LABS:  Last CBC Lab Results  Component Value Date   WBC 12.5 10/17/2023   HGB 14.6 10/17/2023   HCT 45 10/17/2023   MCV 90.3 05/06/2023   MCH 29.0 05/06/2023   RDW 13.8 05/06/2023   PLT 276 05/06/2023   Last metabolic panel Lab Results  Component Value Date   GLUCOSE 165 (H) 04/17/2024   NA 140 04/17/2024   K 4.6 04/17/2024   CL 102 04/17/2024   CO2 25 04/17/2024   BUN 24 04/17/2024   CREATININE 1.15 04/17/2024   EGFR 62 04/17/2024   CALCIUM  9.5 04/17/2024   PROT 6.7 04/17/2024   ALBUMIN 4.5 10/17/2023   BILITOT 0.7 04/17/2024   ALKPHOS 94 10/17/2023   AST 21 04/17/2024   ALT 15 04/17/2024   Last hemoglobin A1c Lab Results  Component Value Date   HGBA1C 6.1 (A) 04/17/2024   HGBA1C 6.1 04/17/2024   HGBA1C 6.1 04/17/2024   HGBA1C 6.1 04/17/2024    IMPRESSION AND PLAN:  Chronic right shoulder pain, progressive the last 1 month or so.  He has some glenohumeral arthritis and  rotator cuff tendinosis and impingement. Home physical therapy is not leading to any improvement lately. Steroid injection has helped significantly with decreasing pain and gaining function for at least a few months. He wants to proceed with steroid injection again today.  Ultrasound-guided injection is preferred based on studies that show increased duration, increased effect, greater accuracy, decreased procedural pain, increased response rate, and decreased cost with ultrasound-guided versus blind injection. Procedure:  Real-time ultrasound guided injection of right subacromial bursa. Device: GE [employer] informed consent obtained.  Timeout conducted.  No overlying erythema, induration, or other signs of local infection. After sterile prep with Betadine, injected a mixture of 40 mg of Kenalog  and 3 mL of 1% lidocaine without epi.  Injectate seen filling subacromial bursa.. Patient tolerated the procedure well.  No immediate complications.  Post-injection care discussed. Advised to call if fever/chills, erythema, drainage, or persistent bleeding. Impression: Technically successful ultrasound-guided injection.  #2 unsteady gait due to orthostatic hypotension. He requests referral to physical therapy--> ordered today.  An After Visit Summary was printed and given to the patient.  FOLLOW UP: Return for Keep appt already arranged for 07/17/24.  Signed:  Gerlene Hockey, MD           05/07/2024

## 2024-05-09 ENCOUNTER — Encounter: Payer: Self-pay | Admitting: Family Medicine

## 2024-05-10 ENCOUNTER — Telehealth: Payer: Self-pay

## 2024-05-10 NOTE — Telephone Encounter (Signed)
 Copied from CRM [id number]. 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 Please contact patient at [phone]   Per patient, he called to cancel his appointment because he was not feeling well. Patient was informed that he could call the per our protocol, if you cancel appointment less than 24 hours prior to the appointment, a no show letter is generated. It was not documented that the reason for the cancel/ rescheduled appointment was due to any other reason other than no show/ 24 hour cancellation.

## 2024-05-21 DIAGNOSIS — R262 Difficulty in walking, not elsewhere classified: Secondary | ICD-10-CM | POA: Diagnosis not present

## 2024-05-27 ENCOUNTER — Other Ambulatory Visit: Payer: Self-pay | Admitting: Family Medicine

## 2024-05-27 DIAGNOSIS — M109 Gout, unspecified: Secondary | ICD-10-CM

## 2024-05-28 ENCOUNTER — Ambulatory Visit
Admission: RE | Admit: 2024-05-28 | Discharge: 2024-05-28 | Disposition: A | Discharge status: Home / Self Care | Attending: Family Medicine | Admitting: Family Medicine

## 2024-05-28 ENCOUNTER — Ambulatory Visit: Payer: Self-pay | Admitting: Family Medicine

## 2024-05-28 ENCOUNTER — Ambulatory Visit

## 2024-05-28 VITALS — BP 151/84 | HR 99 | Temp 97.3°F | Resp 19

## 2024-05-28 DIAGNOSIS — J8410 Pulmonary fibrosis, unspecified: Secondary | ICD-10-CM | POA: Diagnosis not present

## 2024-05-28 DIAGNOSIS — R059 Cough, unspecified: Secondary | ICD-10-CM

## 2024-05-28 DIAGNOSIS — J189 Pneumonia, unspecified organism: Secondary | ICD-10-CM

## 2024-05-28 DIAGNOSIS — J90 Pleural effusion, not elsewhere classified: Secondary | ICD-10-CM | POA: Diagnosis not present

## 2024-05-28 DIAGNOSIS — R0602 Shortness of breath: Secondary | ICD-10-CM

## 2024-05-28 DIAGNOSIS — R531 Weakness: Secondary | ICD-10-CM

## 2024-05-28 MED ORDER — PREDNISONE 20 MG PO TABS: ORAL_TABLET | ORAL | 0 refills | Status: DC

## 2024-05-28 MED ORDER — AMOXICILLIN-POT CLAVULANATE 875-125 MG PO TABS: 1.0000 | ORAL_TABLET | Freq: Two times a day (BID) | ORAL | 0 refills | Status: AC

## 2024-05-28 MED ORDER — HYDROCODONE BIT-HOMATROP MBR 5-1.5 MG/5ML PO SOLN
5.0000 mL | Freq: Four times a day (QID) | ORAL | 0 refills | Status: DC | PRN
Start: 2024-05-28 — End: 2024-06-12

## 2024-05-28 MED ORDER — AZITHROMYCIN 250 MG PO TABS: 250.0000 mg | ORAL_TABLET | Freq: Every day | ORAL | 0 refills | Status: DC

## 2024-05-28 NOTE — Discharge Instructions (Addendum)
 Advised patient/wife of chest x-ray results with hardcopy and image provided.  Advised patient to take medications as directed with food to completion.  Advised patient to take prednisone  and Zithromax  with first dose of Augmentin  for the next 5 of 10 days.  Advised may use Hycodan cough syrup prior to sleep for cough due to sedative effects.  Encouraged to increase daily water intake to 64 ounces per day while taking these medications.  Advised if symptoms worsen and/or unresolved please follow-up with your PCP or here for further evaluation.  Advised patient to have repeat chest x-ray on or about 06/28/2024 to ensure infection has not resolved.

## 2024-05-28 NOTE — Telephone Encounter (Signed)
 FYI Only or Action Required?: FYI only for provider.  Patient was last seen in primary care on 05/07/2024 by McGowen, Aleene DEL, MD.  Called Nurse Triage reporting Shortness of Breath.  Symptoms began a week ago.  Triage Disposition: Go to ED Now (Notify PCP)  Patient/caregiver understands and will follow disposition?: Yes        Copied from CRM [id number]. Topic: Clinical - Red Word Triage >> May 28, 2024 10:35 AM Franky GRADE wrote: Red Word that prompted transfer to Nurse Triage: Patient is experiencing difficulty breathing, and muscle spasms in the chest. He is also feeling unbalanced. Reason for Disposition  [1] MODERATE difficulty breathing (e.g., speaks in phrases, SOB even at rest, pulse 100-120) AND [2] NEW-onset or WORSE than normal  Answer Assessment - Initial Assessment Questions This RN recommends pt goes to ED and has another adult drive pt there. Pt agreeable.    Pt states his balance has been bad for some time, but it seems like it is worse Difficulty breathing in last week; pt states it is severe Pt states when he lays down he will intermittently have spasms in stomach and chest  RESPIRATORY STATUS: Describe your breathing? (e.g., wheezing, shortness of breath, unable to speak, severe coughing)      Difficulty breathing even when sitting still   PATTERN Does the difficult breathing come and go, or has it been constant since it started?      Constant  RECURRENT SYMPTOM: Have you had difficulty breathing before? If Yes, ask: When was the last time? and What happened that time?      No, been going on for about a year or so  Denies chest pain  O2 SATURATION MONITOR:  Do you use an oxygen saturation monitor (pulse oximeter) at home? If Yes, ask: What is your reading (oxygen level) today? What is your usual oxygen saturation reading? (e.g., 95%)       Not able to check  Protocols used: Breathing Difficulty-A-AH

## 2024-05-28 NOTE — ED Provider Notes (Signed)
 [name]    CSN: [id number] Arrival date & time: 05/28/24  1427      History   Chief Complaint Chief Complaint  Patient presents with   Wheezing    Trouble breathing - Entered by patient   Shortness of Breath   Cough    HPI Jason [name] is a 88 y.o. male.   HPI Very pleasant 88 year old male presents with wheezing, productive cough, weakness, and chest tightness for 1 week.  PMH significant for CAD, T2DM, stage III CKD and prostate cancer.  Patient is accompanied by his wife this afternoon.  Past Medical History:  Diagnosis Date   Anxiety and depression    Ascending aortic aneurysm 01/22/2022   Bilateral shoulder pain, unspecified chronicity    XR 10/2023-->+Bilat RC tendonopathy+AC arth and mild GH arth on R.  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   Obstructive sleep apnea of adult 08/11/2022   Diabetic nephropathy with proteinuria (HCC) 08/11/2022   Obesity 08/11/2022   Hyperlipidemia 08/11/2022   Stage 3a chronic kidney disease (HCC) 08/11/2022   Anxiety disorder, unspecified 08/11/2022   Diabetes mellitus (HCC) 08/11/2022   Primary malignant neoplasm of prostate (HCC) 08/02/2022   Carcinoma of prostate (HCC) 08/02/2022   Shortness of breath 02/22/2022   Pure  hypercholesterolemia 02/22/2022   Pain in joint, shoulder region 02/22/2022   Other malaise and fatigue 02/22/2022   Nonexudative age-related macular degeneration, bilateral, intermediate dry stage 02/22/2022   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   Gastroesophageal reflux disease 02/22/2022   Benign paroxysmal positional vertigo 02/22/2022   CAD in native artery 01/22/2022   Thoracic aortic aneurysm, without rupture, unspecified 01/22/2022   Gait instability 01/22/2022   Essential hypertension 01/17/2022   Type 2 diabetes mellitus without complications (HCC) 01/17/2022   HLD (hyperlipidemia) 01/17/2022   Obstructive sleep apnea 01/17/2022   Sensorineural hearing loss, bilateral 11/09/2021   Orthostatic hypotension 11/09/2021   Imbalance 11/09/2021   Overweight 02/21/2020   First degree heart block 02/21/2020   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   Malignant tumor of prostate (HCC) 02/21/2020   Vitamin D  deficiency 05/31/2016   History of malignant neoplasm of prostate 05/31/2016   History of malignant neoplasm of skin 05/31/2016   Bilateral hearing loss 05/31/2016   Anxiety state 05/31/2016   Bronchospasm 09/24/2010   Cough 09/19/2010   Acute bronchitis 09/19/2010   Allergic rhinitis due to pollen 09/19/2010   Acute sinusitis 09/19/2010    Past Surgical History:  Procedure Laterality Date   CHOLECYSTECTOMY, LAPAROSCOPIC     1992   DEXA     T score -1.0 Sept 2024   ROBOT ASSISTED LAPAROSCOPIC RADICAL PROSTATECTOMY     1999   ROTATOR CUFF REPAIR  Left 1991, right 1997   TOTAL KNEE ARTHROPLASTY     bilat 2004   TRANSTHORACIC ECHOCARDIOGRAM     11/24/22, EF nl, +LVH with no WMA, grd I DD, valves ok (no amyloid changes)       Home Medications    Prior to Admission medications   Medication Sig Start Date End Date Taking? Authorizing Provider  amoxicillin -clavulanate (AUGMENTIN ) 875-125 MG tablet Take 1 tablet by mouth 2 (two) times daily for 10 days. 05/28/24 06/07/24 Yes  Teddy Sharper, FNP  azithromycin  (ZITHROMAX ) 250 MG tablet Take 1 tablet (250 mg total) by mouth daily. Take first 2 tablets together, then 1 every day until finished. 05/28/24  Yes Teddy Sharper, FNP  HYDROcodone bit-homatropine (HYCODAN) 5-1.5 MG/5ML syrup Take 5 mLs by mouth every 6 (six) hours as needed for cough. 05/28/24  Yes Teddy Sharper, FNP  predniSONE  (DELTASONE ) 20 MG tablet Take 3 tabs PO daily x 5 days. 05/28/24  Yes Teddy Sharper, FNP  allopurinol  (ZYLOPRIM ) 300 MG tablet TAKE 1 TABLET BY MOUTH EVERY DAY 05/28/24   McGowen, Aleene DEL, MD  aspirin EC 81 MG tablet Take 1 tablet every day by oral route. 02/21/20   [provider]  atorvastatin  (LIPITOR) 40 MG tablet TAKE 1 TABLET BY MOUTH EVERY DAY 05/07/24   McGowen, Aleene DEL, MD  Continuous Glucose Receiver (FREESTYLE LIBRE 3 READER) DEVI Inject 1 each into the skin every 14 (fourteen) days. 06/08/23   McGowen, Aleene DEL, MD  Continuous Glucose Sensor (FREESTYLE LIBRE 3 PLUS SENSOR) MISC Change sensor every 15 days. 04/17/24   McGowen, Aleene DEL, MD  fluticasone  (FLONASE ) 50 MCG/ACT nasal spray Place 1-2 sprays into both nostrils daily. 01/26/23   Levora Reyes SAUNDERS, MD  glucose blood test strip E11.9 Use to test blood sugar twice daily or as needed 08/12/22   Pavero, Lonni, RPH  ipratropium (ATROVENT ) 0.03 % nasal spray PLACE 2 SPRAYS INTO BOTH NOSTRILS EVERY 12 (TWELVE) HOURS. USE 15 TO 30 MIN PRIOR TO BREAKFAST AND SUPPER 04/08/23   McGowen, Aleene DEL, MD  metFORMIN  (GLUCOPHAGE ) 500 MG tablet Take 1 tablet (500 mg total) by mouth 2 (two) times daily with a meal. 04/17/24   McGowen, Aleene DEL, MD  metoprolol  tartrate (LOPRESSOR ) 25 MG tablet TAKE 1 TABLET BY MOUTH TWICE A DAY Patient taking differently: Take 12.5 mg by mouth 2 (two) times daily. 05/30/23   Raford Riggs, MD  midodrine  (PROAMATINE ) 10 MG tablet TAKE 1 TABLET BEFORE GETTING OUT OF BED OR SOON AFTER, 1 TABLET AT 12PM, 1 TABLET AROUND 4PM Patient taking  differently: TAKE 1 TABLET BEFORE GETTING OUT OF BED OR SOON AFTER, 1 TABLET AT 12PM. 05/31/23   Raford Riggs, MD  Multiple Vitamins-Minerals (ICAPS AREDS 2 PO) TAKE 1 CAPSULE BY MOUTH TWICE A DAY FOR MACULAR DEGENERATION 08/05/22   [provider]  omeprazole  (PRILOSEC) 20 MG capsule TAKE 1 CAPSULE BY MOUTH EVERY DAY 01/11/24   McGowen, Aleene DEL, MD  pioglitazone  (ACTOS ) 15 MG tablet TAKE 1 TABLET (15 MG TOTAL) BY MOUTH DAILY. 05/07/24   McGowen, Aleene DEL, MD  tirzepatide  (MOUNJARO ) 7.5 MG/0.5ML Pen Inject 7.5 mg into the skin once a week. 04/17/24   McGowen, Aleene DEL, MD  venlafaxine  (EFFEXOR ) 75 MG tablet TAKE 1 TABLET BY MOUTH EVERY DAY 05/02/24   McGowen, Aleene DEL, MD    Family History Family History  Problem Relation Age of Onset   Heart attack Mother    Cancer Father    Cancer Sister  Social History Social History   Tobacco Use   Smoking status: Former    Types: Pipe   Smokeless tobacco: Never  Vaping Use   Vaping status: Never Used  Substance Use Topics   Alcohol use: Yes    Alcohol/week: 2.0 standard drinks of alcohol    Types: 2 Glasses of wine per week    Comment: a little bit   Drug use: Never     Allergies   Lisinopril   Review of Systems Review of Systems  Constitutional:  Positive for activity change, chills and fatigue.  HENT:  Positive for congestion.   Respiratory:  Positive for cough and shortness of breath.   All other systems reviewed and are negative.    Physical Exam Triage Vital Signs ED Triage Vitals  Encounter Vitals Group     BP      Girls Systolic BP Percentile      Girls Diastolic BP Percentile      Boys Systolic BP Percentile      Boys Diastolic BP Percentile      Pulse      Resp      Temp      Temp src      SpO2      Weight      Height      Head Circumference      Peak Flow      Pain Score      Pain Loc      Pain Education      Exclude from Growth Chart    No data found.  Updated Vital Signs BP (!)  151/84   Pulse 99   Temp (!) 97.3 F (36.3 C)   Resp 19   SpO2 94%    Physical Exam Vitals and nursing note reviewed.  Constitutional:      Appearance: Normal appearance. He is normal weight. He is ill-appearing.  HENT:     Head: Normocephalic and atraumatic.     Right Ear: Tympanic membrane, ear canal and external ear normal.     Left Ear: Tympanic membrane, ear canal and external ear normal.     Mouth/Throat:     Mouth: Mucous membranes are moist.     Pharynx: Oropharynx is clear.  Eyes:     Extraocular Movements: Extraocular movements intact.     Conjunctiva/sclera: Conjunctivae normal.     Pupils: Pupils are equal, round, and reactive to light.  Cardiovascular:     Rate and Rhythm: Normal rate and regular rhythm.     Pulses: Normal pulses.     Heart sounds: Normal heart sounds.  Pulmonary:     Effort: Pulmonary effort is normal.     Breath sounds: No wheezing, rhonchi or rales.     Comments: Diminished breath sounds noted throughout Musculoskeletal:        General: Normal range of motion.  Skin:    General: Skin is warm and dry.  Neurological:     General: No focal deficit present.     Mental Status: He is alert and oriented to person, place, and time. Mental status is at baseline.  Psychiatric:        Mood and Affect: Mood normal.        Behavior: Behavior normal.      UC Treatments / Results  Labs (all labs ordered are listed, but only abnormal results are displayed) Labs Reviewed - No data to display  EKG   Radiology DG Chest 2 View Result  Date: 05/28/2024 EXAM: 2 VIEW(S) XRAY OF THE CHEST 05/28/2024 03:39:00 PM COMPARISON: Chest x-ray 06/16/2023. CLINICAL HISTORY: Cough x 1 week. 88 year-old male c/o cough, SOB and weakness x 1 week. FINDINGS: LUNGS AND PLEURA: There is some atelectasis and patchy airspace disease in the left lower lobe which is new. There is a new trace left pleural effusion. Calcified granuloma in the right upper lobe appears stable.  No pulmonary edema. No pneumothorax. HEART AND MEDIASTINUM: Heart is mildly enlarged, unchanged. Large hiatal hernia is again seen. BONES AND SOFT TISSUES: No acute osseous abnormality. IMPRESSION: 1. New left lower lobe airspace disease. Recommend follow-up chest x-ray in 4 to 6 weeks to confirm resolution. 2. New trace left pleural effusion. 3. Stable mild cardiomegaly. 4. Stable large hiatal hernia. Electronically signed by: Greig Pique MD 05/28/2024 04:07 PM EDT RP Workstation: [id number]    Procedures Procedures (including critical care time)  Medications Ordered in UC Medications - No data to display  Initial Impression / Assessment and Plan / UC Course  I have reviewed the triage vital signs and the nursing notes.  Pertinent labs & imaging results that were available during my care of the patient were reviewed by me and considered in my medical decision making (see chart for details).     MDM: 1.  Community-acquired pneumonia of left lower lobe of lung-CXR results revealed above patient/wife advised, Rx'd Augmentin  875/125 mg tablet: Take 1 tablet twice daily x 10 days, Rx'd Zithromax  (500 mg day 1, then 250 mg day 2 through 5);  2.  Cough, unspecified type-Rx'd prednisone  60 mg tablet: Take 3 tablets p.o. daily x 5 days, Rx'd Hycodan 5-1.5 mg / 5 mL syrup: Take 5 mL every 6 hours, as needed for cough. Advised patient/wife of chest x-ray results with hardcopy and image provided.  Advised patient to take medications as directed with food to completion.  Advised patient to take prednisone  and Zithromax  with first dose of Augmentin  for the next 5 of 10 days.  Advised may use Hycodan cough syrup prior to sleep for cough due to sedative effects.  Encouraged to increase daily water intake to 64 ounces per day while taking these medications.  Advised if symptoms worsen and/or unresolved please follow-up with your PCP or here for further evaluation.  Advised patient to have repeat chest x-ray on or  about 06/28/2024 to ensure infection has not resolved.  Patient discharged home, hemodynamically stable. Final Clinical Impressions(s) / UC Diagnoses   Final diagnoses:  Cough, unspecified type  Community acquired pneumonia of left lower lobe of lung     Discharge Instructions      Advised patient/wife of chest x-ray results with hardcopy and image provided.  Advised patient to take medications as directed with food to completion.  Advised patient to take prednisone  and Zithromax  with first dose of Augmentin  for the next 5 of 10 days.  Advised may use Hycodan cough syrup prior to sleep for cough due to sedative effects.  Encouraged to increase daily water intake to 64 ounces per day while taking these medications.  Advised if symptoms worsen and/or unresolved please follow-up with your PCP or here for further evaluation.  Advised patient to have repeat chest x-ray on or about 06/28/2024 to ensure infection has not resolved.     ED Prescriptions     Medication Sig Dispense Auth. Provider   amoxicillin -clavulanate (AUGMENTIN ) 875-125 MG tablet Take 1 tablet by mouth 2 (two) times daily for 10 days. 20 tablet Edouard Gikas,  FNP   azithromycin  (ZITHROMAX ) 250 MG tablet Take 1 tablet (250 mg total) by mouth daily. Take first 2 tablets together, then 1 every day until finished. 6 tablet Lynne Takemoto, FNP   predniSONE  (DELTASONE ) 20 MG tablet Take 3 tabs PO daily x 5 days. 15 tablet Karron Goens, FNP   HYDROcodone bit-homatropine (HYCODAN) 5-1.5 MG/5ML syrup Take 5 mLs by mouth every 6 (six) hours as needed for cough. 120 mL Teddy Sharper, FNP      I have reviewed the PDMP during this encounter.   Teddy Sharper, FNP 05/28/24 1644

## 2024-05-28 NOTE — ED Triage Notes (Addendum)
 Pt presents to uc with co productive cough, weakness, chest tightness for one week. Pt reports history of orthostatic hypotension. Wife at bedside. Pt reports daughter whom he lives with has been sick as well.

## 2024-05-28 NOTE — Telephone Encounter (Signed)
 FYI reviewed. Pt advised to go to ED

## 2024-05-29 ENCOUNTER — Telehealth: Payer: Self-pay

## 2024-05-29 NOTE — Telephone Encounter (Signed)
 LMTRC

## 2024-06-01 ENCOUNTER — Ambulatory Visit: Payer: Self-pay

## 2024-06-01 NOTE — Telephone Encounter (Signed)
 Patient states that he is too weak to go anywhere. Endorses continued dizziness and weakness. Patient reports tripping last night and falling backwards. Endorses hitting his head but no LOC. Patient was told by EMS last night that he was likely dehydrated and needed to be seen in the ED. Patient refused last night to go and refusing today. Patient is wanting a nurse to come give him fluids at home. Patient is given strict ED precautions. Patient is asking for a call back from the office.  FYI Only or Action Required?: Action required by provider: clinical question for provider.  Patient was last seen in primary care on 05/07/2024 by McGowen, Aleene DEL, MD.  Called Nurse Triage reporting Fall.  Symptoms began yesterday.  Interventions attempted: Rest, hydration, or home remedies.  Symptoms are: unchanged.  Triage Disposition: Go to ED Now (or PCP Triage)  Patient/caregiver understands and will follow disposition?: No, refuses disposition  Reason for Disposition  Patient sounds very sick or weak to the triager  Answer Assessment - Initial Assessment Questions 1. MECHANISM: How did the fall happen?     Patient reports getting up to the bathroom and tripped.  2. DOMESTIC VIOLENCE AND ELDER ABUSE SCREENING: Did you fall because someone pushed you or tried to hurt you? If Yes, ask: Are you safe now?     no 3. ONSET: When did the fall happen? (e.g., minutes, hours, or days ago)     Last night 4. LOCATION: What part of the body hit the ground? (e.g., back, buttocks, head, hips, knees, hands, head, stomach)     Sideways backwards 5. INJURY: Did you hurt (injure) yourself when you fell? If Yes, ask: What did you injure? Tell me more about this? (e.g., body area; type of injury; pain severity)     Patient did hit his head but denies LOC 6. PAIN: Is there any pain? If Yes, ask: How bad is the pain? (e.g., Scale 0-10; or none, mild,      no 7. SIZE: For cuts, bruises, or  swelling, ask: How large is it? (e.g., inches or centimeters)      no 9. OTHER SYMPTOMS: Do you have any other symptoms? (e.g., dizziness, fever, weakness; new-onset or worsening).      Weakness, dizziness,  10. CAUSE: What do you think caused the fall (or falling)? (e.g., dizzy spell, tripped)       Tripped.  Protocols used: Falls and [facility]

## 2024-06-01 NOTE — Telephone Encounter (Signed)
 Pt was advised by CMA, we cannot send someone to his home for this. He should proceed to the ED

## 2024-06-01 NOTE — Telephone Encounter (Signed)
 Copied from CRM [id number]. Topic: Clinical - Red Word Triage >> Jun 01, 2024  8:12 AM Berneda FALCON wrote: Red Word that prompted transfer to Nurse Triage: Patient fell last night and had EMS come to help him up. Patient states they told him he needs fluids and would like to know if someone could come out to his home and give him an IV. EMS told him he was dehydrated. States there were no injuries but he is feeling weak.  Still feeling weak today  Call disconnected. Called back and left message to call back.

## 2024-06-01 NOTE — Telephone Encounter (Signed)
 FYI Only or Action Required?: Action required by provider: clinical question for provider.  Patient was last seen in primary care on 05/07/2024 by McGowen, Aleene DEL, MD.  Called Nurse Triage reporting Advice Only.  Symptoms began information only call.  Interventions attempted: Other: information only call.  Symptoms are: information only call.  Triage Disposition: Go to ED Now (overriding Call PCP When Office is Open)  Patient/caregiver understands and will follow disposition?: No, wishes to speak with PCP                              Reason for Disposition  [1] Caller requesting NON-URGENT health information AND [2] PCP's office is the best resource  Answer Assessment - Initial Assessment Questions 1. REASON FOR CALL: What is the main reason for your call? or How can I best help you?     Patient is calling in again after being triaged this morning by another RN. Patient denies new or worsening symptoms. Patient has still not gone to the ED. Patient is upset because he has not heard back from his provider and wants to know if he can send someone out to administer him fluids. This RN advised patient that ED has ability to administer fluids. Patient declined ED again and requested this RN to send another message to PCP. Please advise.  Protocols used: Information Only Call - No Triage-A-AH

## 2024-06-02 ENCOUNTER — Ambulatory Visit

## 2024-06-02 ENCOUNTER — Telehealth: Payer: Self-pay

## 2024-06-02 DIAGNOSIS — F411 Generalized anxiety disorder: Secondary | ICD-10-CM | POA: Diagnosis not present

## 2024-06-02 DIAGNOSIS — E785 Hyperlipidemia, unspecified: Secondary | ICD-10-CM | POA: Diagnosis not present

## 2024-06-02 DIAGNOSIS — K7460 Unspecified cirrhosis of liver: Secondary | ICD-10-CM | POA: Diagnosis not present

## 2024-06-02 DIAGNOSIS — N1831 Chronic kidney disease, stage 3a: Secondary | ICD-10-CM | POA: Diagnosis not present

## 2024-06-02 DIAGNOSIS — J90 Pleural effusion, not elsewhere classified: Secondary | ICD-10-CM | POA: Diagnosis not present

## 2024-06-02 DIAGNOSIS — I10 Essential (primary) hypertension: Secondary | ICD-10-CM | POA: Diagnosis not present

## 2024-06-02 DIAGNOSIS — R7989 Other specified abnormal findings of blood chemistry: Secondary | ICD-10-CM | POA: Diagnosis not present

## 2024-06-02 DIAGNOSIS — E119 Type 2 diabetes mellitus without complications: Secondary | ICD-10-CM | POA: Diagnosis not present

## 2024-06-02 DIAGNOSIS — I7121 Aneurysm of the ascending aorta, without rupture: Secondary | ICD-10-CM | POA: Diagnosis not present

## 2024-06-02 DIAGNOSIS — R2681 Unsteadiness on feet: Secondary | ICD-10-CM | POA: Diagnosis not present

## 2024-06-02 DIAGNOSIS — E663 Overweight: Secondary | ICD-10-CM | POA: Diagnosis not present

## 2024-06-02 NOTE — Telephone Encounter (Signed)
 Spoke with Jonthan, [place] reports dizziness and a recent fall. Reported he thinks he needs fluids and CT. Requested patient go to the Emergency room.

## 2024-06-03 DIAGNOSIS — R2689 Other abnormalities of gait and mobility: Secondary | ICD-10-CM | POA: Diagnosis not present

## 2024-06-03 DIAGNOSIS — Z6828 Body mass index (BMI) 28.0-28.9, adult: Secondary | ICD-10-CM | POA: Diagnosis not present

## 2024-06-03 DIAGNOSIS — R918 Other nonspecific abnormal finding of lung field: Secondary | ICD-10-CM | POA: Diagnosis not present

## 2024-06-03 DIAGNOSIS — I5021 Acute systolic (congestive) heart failure: Secondary | ICD-10-CM | POA: Diagnosis not present

## 2024-06-03 DIAGNOSIS — R0602 Shortness of breath: Secondary | ICD-10-CM | POA: Diagnosis not present

## 2024-06-03 DIAGNOSIS — K7460 Unspecified cirrhosis of liver: Secondary | ICD-10-CM | POA: Diagnosis not present

## 2024-06-03 DIAGNOSIS — I081 Rheumatic disorders of both mitral and tricuspid valves: Secondary | ICD-10-CM | POA: Diagnosis not present

## 2024-06-03 DIAGNOSIS — I7121 Aneurysm of the ascending aorta, without rupture: Secondary | ICD-10-CM | POA: Diagnosis not present

## 2024-06-03 DIAGNOSIS — E663 Overweight: Secondary | ICD-10-CM | POA: Diagnosis not present

## 2024-06-03 DIAGNOSIS — I471 Supraventricular tachycardia, unspecified: Secondary | ICD-10-CM | POA: Diagnosis not present

## 2024-06-03 DIAGNOSIS — E876 Hypokalemia: Secondary | ICD-10-CM | POA: Diagnosis not present

## 2024-06-03 DIAGNOSIS — Z7984 Long term (current) use of oral hypoglycemic drugs: Secondary | ICD-10-CM | POA: Diagnosis not present

## 2024-06-03 DIAGNOSIS — I130 Hypertensive heart and chronic kidney disease with heart failure and stage 1 through stage 4 chronic kidney disease, or unspecified chronic kidney disease: Secondary | ICD-10-CM | POA: Diagnosis not present

## 2024-06-03 DIAGNOSIS — E1122 Type 2 diabetes mellitus with diabetic chronic kidney disease: Secondary | ICD-10-CM | POA: Diagnosis not present

## 2024-06-03 DIAGNOSIS — K760 Fatty (change of) liver, not elsewhere classified: Secondary | ICD-10-CM | POA: Diagnosis not present

## 2024-06-03 DIAGNOSIS — E119 Type 2 diabetes mellitus without complications: Secondary | ICD-10-CM | POA: Diagnosis not present

## 2024-06-03 DIAGNOSIS — R7989 Other specified abnormal findings of blood chemistry: Secondary | ICD-10-CM | POA: Diagnosis not present

## 2024-06-03 DIAGNOSIS — N1831 Chronic kidney disease, stage 3a: Secondary | ICD-10-CM | POA: Diagnosis not present

## 2024-06-03 DIAGNOSIS — R0600 Dyspnea, unspecified: Secondary | ICD-10-CM | POA: Diagnosis not present

## 2024-06-03 DIAGNOSIS — E785 Hyperlipidemia, unspecified: Secondary | ICD-10-CM | POA: Diagnosis not present

## 2024-06-03 DIAGNOSIS — J9811 Atelectasis: Secondary | ICD-10-CM | POA: Diagnosis not present

## 2024-06-03 DIAGNOSIS — I4891 Unspecified atrial fibrillation: Secondary | ICD-10-CM | POA: Diagnosis not present

## 2024-06-03 DIAGNOSIS — J90 Pleural effusion, not elsewhere classified: Secondary | ICD-10-CM | POA: Diagnosis not present

## 2024-06-03 DIAGNOSIS — I2510 Atherosclerotic heart disease of native coronary artery without angina pectoris: Secondary | ICD-10-CM | POA: Diagnosis not present

## 2024-06-03 DIAGNOSIS — I452 Bifascicular block: Secondary | ICD-10-CM | POA: Diagnosis not present

## 2024-06-03 DIAGNOSIS — R042 Hemoptysis: Secondary | ICD-10-CM | POA: Diagnosis not present

## 2024-06-03 DIAGNOSIS — I5023 Acute on chronic systolic (congestive) heart failure: Secondary | ICD-10-CM | POA: Diagnosis not present

## 2024-06-03 DIAGNOSIS — F411 Generalized anxiety disorder: Secondary | ICD-10-CM | POA: Diagnosis not present

## 2024-06-03 DIAGNOSIS — E871 Hypo-osmolality and hyponatremia: Secondary | ICD-10-CM | POA: Diagnosis not present

## 2024-06-03 DIAGNOSIS — R2681 Unsteadiness on feet: Secondary | ICD-10-CM | POA: Diagnosis not present

## 2024-06-03 DIAGNOSIS — E669 Obesity, unspecified: Secondary | ICD-10-CM | POA: Diagnosis not present

## 2024-06-03 DIAGNOSIS — F419 Anxiety disorder, unspecified: Secondary | ICD-10-CM | POA: Diagnosis not present

## 2024-06-03 DIAGNOSIS — Z79899 Other long term (current) drug therapy: Secondary | ICD-10-CM | POA: Diagnosis not present

## 2024-06-03 DIAGNOSIS — I10 Essential (primary) hypertension: Secondary | ICD-10-CM | POA: Diagnosis not present

## 2024-06-03 DIAGNOSIS — J918 Pleural effusion in other conditions classified elsewhere: Secondary | ICD-10-CM | POA: Diagnosis not present

## 2024-06-06 NOTE — Progress Notes (Addendum)
 [facility] Progress Note 06/06/2024  Hospital Day 3  Assessement   Active Hospital Problems   Diabetes mellitus (*)    *Bilateral pleural effusion    Elevated troponin I level    Dyspnea, unspecified type    Aneurysm of ascending aorta without rupture    Cirrhosis (*)    Stage 3a chronic kidney disease (*)    Gait instability    Hyperlipidemia    Essential hypertension    Overweight    Anxiety state     Plan   # Dyspnea, unspecified type secondary to Bilateral pleural effusions/acute systolic CHF-elevated nt-pro bnp >15K.  Echo showed EF of 30-35%, moderate MR, moderate TR. Reviewed records from [facility] which showed EF of 55-60% on 11/24/2022. Continue diuresis with IV lasix. S/p thoracentesis on 06/04/2024. Currently pt saturating well on RA. Appreciate cardiology consultation. He underwent cardiolyte stress test today which was negative for ischemia.       # Diabetes mellitus-hgb A1C 6.8 suggesting good control. He takes zepbound  and meformin as outpt. Continue humalog ISS with meals and at bedtime.      # Elevated troponin I level-secondary to CHF. Pt was in a fib with RVR on presentation which could have been contributing to elevated troponins.      # a fib with RVR-new diagnosis. continue metoprolol . Chads 2 vasc score was 4 points with stroke risk of 4.8% per year. Continue full-dose lovenox for now. Will plan to transition to eliquis at discharge.       # Aneurysm of ascending aorta without rupture-noted. Outpt follow-up with PCP.       # Cirrhosis-new diagnosis. Will need to refer to GI as outpt. Possibly secondary to fatty liver disease (pt states that he has been obese for most of his life but lost 80 lbs on zepbound  in the past 9 months).      # Stage 3a chronic kidney disease-noted.       # Gait instability-continue PT.      # Hyperlipidemia-Cholesterol panel showed elevated cholesterol of 185, LD of 107. Continue  statin.      # Essential hypertension-continue metoprolol  with prn IV hydralazine .      # Overweight-affects all aspects of care       # Anxiety state-noted   #hemoptysis-mild. CT chest without contrast on 06/05/2024 negative. Likely secondary to coughing.   #hypokalemia-repleted.   Plan of care discussed with pt, nursing staff, wife at bedside, cardiology (Dr. Uvaldo). D/c planning for AM  Subjective  Jason [name] is a 88 y.o. White or Caucasian [1] male admitted for dyspnea secondary to b/l pleural effusions. States that shortness of breath is improving but he has not walked much. No chest pain or palpitations. He has had some mild hemoptysis with blood-tinged napkin at bedside. Denies any chest pain, palpitations, melena, hematochezia.   Objective  Physical Exam: Temp:  [97.5 F (36.4 C)-98.6 F (37 C)]  Heart Rate:  [72-89]  Resp:  [16-20]  BP: (110-137)/(73-80)  SpO2:  [95 %-99 %]   Intake/Output Summary (Last 24 hours) at 06/06/2024 1526 Last data filed at 06/06/2024 1510 Gross per 24 hour  Intake 840 ml  Output 500 ml  Net 340 ml   Wt Readings from Last 1 Encounters:  06/03/24 98 kg (216 lb)     Diet: Consistent Carbohydrate  Percent Meals Eaten (%): 75 %   General: Well developed, well nourished White or Caucasian [1] male resting comfortably in  bed, in no acute distress.  Lymphatic: No cervical, axillary, inguinal lymphadenopathy.  Skin: No rashes.  HEENT: Normocephalic, atraumatic.  Pupils equally round and reactive to light & accomodation. Extraocular movements intact. Oropharynx moist & pink. Nasopharynx clear.   Neck: Supple. No meningismus, lymphadenopathy, carotid bruits, or jugular venous distention.  Chest: Decreased breath sounds.   Heart: Normal S1S2. No rubs, gallups, murmurs.  Abdomen: Soft, non-tender, non-distended.  Normoactive bowel sounds.  No hepatosplenomegaly. No masses.   Breasts, GU, Rectal: Deferred  Extremities: No clubbing,  cyanosis, edema.  Neurologic: Alert & oriented to person, place, time & situation. No focal neurologic deficits.   Labs: Recent Results (from the past 24 hours)  If new NPO order, complete POCT Glucose every 4 hours   Collection Time: 06/05/24  4:04 PM  Result Value Ref Range   Glucose, POC 185 (H) 70 - 99 mg/dL   OPERATOR ID [id number]    INSTRUMENT ID [id number]   If new NPO order, complete POCT Glucose every 4 hours   Collection Time: 06/05/24  7:41 PM  Result Value Ref Range   Glucose, POC 145 (H) 70 - 99 mg/dL   OPERATOR ID [id number]    INSTRUMENT ID [id number]   If new NPO order, complete POCT Glucose every 4 hours   Collection Time: 06/06/24 12:10 AM  Result Value Ref Range   Glucose, POC 142 (H) 70 - 99 mg/dL   OPERATOR ID [id number]    INSTRUMENT ID [id number]   Basic Metabolic Panel   Collection Time: 06/06/24  1:36 AM  Result Value Ref Range   Na 136 136 - 146 mmol/L   Potassium 2.9 (L) 3.7 - 5.4 mmol/L   Cl 95 (L) 97 - 108 mmol/L   CO2 29 20 - 32 mmol/L   AGAP 12 7 - 16 mmol/L   Glucose 140 (H) 65 - 99 mg/dL   BUN 41 (H) 8 - 27 mg/dL   Creatinine 8.83 9.23 - 1.27 mg/dL   Ca 8.8 8.6 - 89.7 mg/dL   BUN/CREAT RATIO 64.6 (H) 11.0 - 26.0   eGFR 61 >=60 mL/min/1.54m2  CBC   Collection Time: 06/06/24  1:50 AM  Result Value Ref Range   WBC 7.9 4.0 - 10.5 thou/mcL   RBC 4.54 (L) 4.63 - 6.08 million/mcL   HGB 13.3 (L) 13.7 - 17.5 gm/dL   HCT 58.9 59.8 - 48.9 %   MCV 90.3 79.0 - 92.2 fL   MCH 29.3 25.7 - 32.2 pg   MCHC 32.4 32.3 - 36.5 gm/dL   Plt Ct 801 849 - 599 thou/mcL   RDW SD 48.1 (H) 35.1 - 46.3 fL   MPV 11.0 9.4 - 12.4 fL   NRBC% 0.0 0.0 - 0.2 /100WBC   Absolute NRBC Count 0.00 0.00 - 0.01 thou/mcL  If new NPO order, complete POCT Glucose every 4 hours   Collection Time: 06/06/24  8:04 AM  Result Value Ref Range   Glucose, POC 170 (H) 70 - 99 mg/dL   OPERATOR ID [id number]    INSTRUMENT ID [id number]   If new NPO order, complete POCT Glucose every 4  hours   Collection Time: 06/06/24 12:17 PM  Result Value Ref Range   Glucose, POC 274 (H) 70 - 99 mg/dL   OPERATOR ID [id number]    INSTRUMENT ID [id number]    Recent Labs    Units 06/06/24 1217 06/06/24 0804 06/06/24 0136 06/06/24 0010 06/05/24 1941 06/05/24 1604  GLUCOSE mg/dL 725* 829* 859* 857*  145* 185*    Imaging: NM Heart Spect Ph Stress (R) Result Date: 06/06/2024 TECHNIQUE: Following pharmacologic stress, the patient underwent standard Sestamibi re-injection cardiac SPECT imaging.  Also following intravenous administration of 92m Technetium Sestamibi, left ventricular wall motion analysis in a cine format was performed and evaluation of left ventricular function was performed throughout the cardiac cycle with computer generated left ventricular ejection fraction. PHARMACEUTICAL: 12.9 mCi technetium sestamibi at rest and 38.3 mCi technetium sestamibi at stress. COMPARISON: Coronary CTA November 04, 2021 INDICATION: Other, Please Specify in Comments Newly reduced LVEF 30-35% from 50-55%  in 11/2022 FINDINGS: No reversible perfusion defects. Matched defect involving the inferior wall. No wall motion abnormalities. Calculated ejection fraction is 44%.   IMPRESSION: 1. No evidence of inducible ischemia. 2. Matched defect involving the inferior wall, favor due to diaphragmatic attenuation artifact. Electronically Signed by: Rilla Pickett on 06/06/2024 12:01 PM    Time spent: 50 minutes   Balinda GORMAN Short, DO 06/06/2024 3:26 PM

## 2024-06-07 ENCOUNTER — Ambulatory Visit: Admitting: Podiatry

## 2024-06-07 NOTE — Nursing Note (Signed)
 Acknowledgement of AVS Instructions  Discharge instructions have been reviewed with the patient/support person. The patient/support person has been provided a copy of the discharge instructions.  The patient/support person has been given the opportunity for a demonstration of specific follow-up care tasks. Shelburne A Maness, RN 06/07/2024 / 3:45 PM

## 2024-06-07 NOTE — Progress Notes (Signed)
 ------------------------------------------------------------------------------- Attestation signed by Marcello MARLA Lennox, MD at 06/07/24 1317 I have personally interviewed and examined the patient and I agree with the consultation note by Duwaine Montenegro, NP cardiology.  [name] is a 88 y.o. male with past medical history of hypertension, hyperlipidemia, type 2 diabetes mellitus, thoracic aortic aneurysm without rupture, cirrhosis of liver, chronic kidney disease stage IIIa presented on   06/02/2024 with chief complaint of shortness of breath, cough, weak, dehydrated.  He was recently seen in urgent care and diagnosed with left-sided pneumonia.  He was placed on steroids, Augmentin  and azithromycin .  He has not noted much improvement with these medications.  He was admitted with new onset atrial fibrillation with RVR and bilateral pleural effusions.   Cardiology is consulted 06/05/2024 for new onset HFrEF 30-35%.  Echocardiogram at [facility] on 11/24/2022 showed LVEF 50 to 55% with mild diastolic dysfunction, mild dilation of ascending aorta at 41 mm.  His new onset of atrial fibrillation with RVR and bilateral pleural effusions may be potential etiology for decline in LVEF.   Labs: sodium 135, potassium 4.5, BUN 33, creatinine 1.25 with GFR 56.  On admission AST was 44 and ALT 77.  Admission NT proBNP 15,462.  Hemoglobin 13.4, platelet count 202,000.  D-dimer was 2.09.  CTPA showed no pulmonary embolism. Large right pleural effusion loculated left pleural effusion extending into the left base fissure./Ascending thoracic aortic aneurysm measured at 4.2 cm, mild cardiomegaly and cardiac calcifications, cirrhosis was noted.    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  Coronary CT at [facility] on 11/04/2021 which showed calcium  score of 340 (left main: 0, LAD 224, circumflex: 65.6, RCA 79.5, PDA 0). Scattered  coronary calcifications causing mild to moderate stenosis in the proximal circumflex, moderate stenosis in the distal RCA and mild stenosis in the LAD.     I have personally reviewed patients cardiac telemetry, current medications, laboratory studies, EKG, echocardiogram and pertinent medical records.  Exam reveals no JVD or carotid bruits. Cardiac exam reveals regular rate and rhythm without murmur. No rub or gallop. Lungs are clear to auscultation. There is no peripheral edema.  Recommendations:  HFrEF, EF 30-35%, drop from 55-60% a year ago CHF, acute systolic A fib with RVR Mild thoracic aortic aneurysm CKD stage 3 NSVT  Essential hypertension  Hyperlipidemia  Diabetes mellitus (*)   Echocardiogram during this admission:   There is mild hypertrophy. EF: 30-35%. Quantitative analysis of left ventricular Global Longitudinal Strain (GLS) imaging is -9.600%. Ejection fraction measured by 3D is 32%, .  Aortic Valve: The aortic valve is tricuspid. The leaflets are mildly thickened. There is mild sclerosis. Mild thickened right coronar cusp. Mild aortic valve regurgitation. .  Mitral Valve: There is moderate regurgitation with a centrally directed jet. .  Tricuspid Valve: There is moderate regurgitation.   Recommendation:   Recommend to switch to oral Lasix 40 mg twice daily. Start low dose toprol  xl, will start low-dose Entresto.   Can switch heparin to Eliquis.  Will wait to start  MRA and SGLT2 inhibitor as tolerated by hemodynamics. Nuclear stress test from yesterday was negative for demonstration of inducible ischemia. Afib:HR is controlled, continue anticoagulation.  We discussed about rate control versus rhythm control strategy.  Discussed about electrical versus mechanical cardioversion and need for TEE. Will consider cardioversion after completion of 3 to 4 weeks of uninterrupted anticoagulation.  Will follow-up in the  morning.   -------------------------------------------------------------------------------  [facility] CARDIOLOGY PROGRESS NOTE   PRIMARY CARDIOLOGIST:Dr. Elspeth Sage - [facility] - since Retired. Will need to establish New Cardiologist CONSULTING CARDIOLOGIST: Dr. [name] on 06/05/24 for CHF  ASSESSMENT: Newly developed HRrEF of 30-35% from normal LVEF 55-60% in 11/2022 NT-proBNP 15,462 Echo 06/03/2024: LVEF 30 to 35% Echo 11/24/2022 at [facility]: LVEF 55 to 60%, no wall motion abnormalities, grade 1 diastolic dysfunction, mild dilation of the ascending aorta measuring 41 mm NM Cardiolite stress test 06/06/24:  No evidence of inducible ischemia. Matched defect involving the inferior wall, favor due to diaphragmatic attenuation artifact. Fatty liver disease Newly diagnosed Atrial fibrillation with RVR - potential etiology of newly reduced LVEF Chads 2 vasc score was 4 points with stroke risk of 4.8% per year.  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 Moderate mitral regurgitation and TR Thoracic aortic aneurysm measuring up to 4.2 x 4.0 cm.  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 CKD stage IIIa  RECOMMENDATIONS: GDMT forHFrEF: Beta-blocker: DC PO Lopressor . Start Toprol  XL 50mg  daily.   ACE/ARB/ARNI: Start Losartan  25mg  po daily.  Diuretic regimen: Change IV Lasix 40 mg  twice daily to Lasix 40mg  bid.  SGLT2: Contraindicated in CKD stage IIIa MRA: Pending tolerance to ARB and K+ results Strict I&O and daily weights 2  g sodium restriction New onset atrial fibrillation Rate control:DC PO Lopressor . Start Toprol  XL 50mg  daily.  Anticoagulation: Lovenox for now secondary to diagnostic testing, i.e. thoracentesis.  Otherwise will need transition to Eliquis 5 mg twice daily. Hypokalemia with K 2.9 yesterday. Will obtain BMP today.   Dr. [name] will follow with any additional recommendations  Olam Specking, MSN, ANP-BC 06/07/2024 / 12:54 PM  ______________________________________________________________________ INTERVAL HISTORY: Patient is sitting in chair. Denies chest pain or shortness of breath. SBP in the 110-120's. Telemetry shows rate controlled Afib rates in the 70's. Has diuresed 700 ml plus 4 unmeasured voids.   HISTORY: Mr. Keaney is a 88 y.o. male with past medical history inclusive of but not limited to hypertension, hyperlipidemia, type 2 diabetes mellitus, thoracic aortic aneurysm without rupture, cirrhosis of liver, chronic kidney disease stage IIIa, Denies history of MI, CVA, tobacco or illicit drug use.   Patient presented to [facility] on 06/02/2024 with chief complaint of shortness of breath, cough, weak, dehydrated.  He was recently seen in urgent care and diagnosed with left-sided pneumonia.  He was placed on steroids, Augmentin  and azithromycin .  He has not noted much improvement with these medications.  He was admitted with new onset atrial fibrillation with RVR and bilateral pleural effusions.  Cardiology is consulted 06/05/2024 for new onset HFrEF 30-35%.  Echocardiogram at [facility] on 11/24/2022 showed LVEF 50 to 55% with mild diastolic dysfunction, mild dilation of ascending aorta at 41 mm.  His new onset of atrial fibrillation with RVR and bilateral pleural effusions may be potential etiology for decline in LVEF.  Pertinent labs, sodium 135, potassium 4.5, BUN 33, creatinine 1.25 with GFR 56.  On admission AST was 44 and ALT 77.  Admission NT proBNP 15,462.   Hemoglobin 13.4, platelet count 202,000.  D-dimer was 2.09.  CTPA showed no pulmonary embolism. Large right pleural effusion loculated left pleural effusion extending into the left base fissure./Ascending thoracic aortic aneurysm measured at 4.2 cm, mild cardiomegaly and cardiac calcifications, cirrhosis was noted.   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  Patient denies history of cardiac arrhythmias.  Patient is unaware of heart rate being elevated.  Denies palpitations, presyncope or syncope.  Denies history of bleeding.  Patient had a coronary CT at [facility] on 11/04/2021 which showed calcium  score of 340 (left main: 0, LAD 224, circumflex: 65.6, RCA 79.5, PDA 0). Scattered coronary calcifications causing mild to moderate stenosis in the proximal circumflex, moderate stenosis in the distal RCA and mild stenosis in the LAD.  Reports having a stress test a long time ago.  I am unable to locate documentation of this.  No documented left heart catheterization   He reports having localized chest wall discomfort across both right and left lower precordium.  Occurs while coughing.  Denies exertional chest pain.  Non positional and non radiating.  No nausea, vomiting, diaphoresis.  Patient reports he was able to ambulate with a walker and a cane prior to admission.  Patient endorses / denies chest pain, shortness of breath, orthopnea, dyspnea on exertion, PND, early satiety, lower extremity edema, palpitations, presyncope or syncope.   On my interview and exam: SBP range 110-1 140s with DBP range 70 to 80s. Telemetry shows atrial fibrillation with ventricular rate 70 to 90s.  Oxygen saturations 96 to 97%.  He has diuresed 900 mL over the past 24 hours +2 unmeasured voids.  I have personally reviewed patients cardiac telemetry, current  medications, laboratory studies, EKG, echocardiogram and pertinent medical records.   ROS:  All systems reviewed and negative other than above.  PMH / PROBLEM LIST: History reviewed. No pertinent past medical history.  SURGICAL HISTORY: History reviewed. No pertinent surgical history.  FAMILY HISTORY: family history is not on file.  SOCIAL HISTORY:   reports that he has never smoked. He has never used smokeless tobacco. He reports current alcohol use of about 1.0 - 2.0 standard drink of alcohol per week. He reports that he does not use drugs.  MEDICATIONS: Infusions:   Medications:  . allopurinol   300 mg Oral Daily  . aspirin  81 mg Oral Daily  . atorvastatin  calcium   40 mg Oral HS  . enoxaparin  1 mg/kg Subcutaneous Q12H  . furosemide  40 mg IntraVENous BID 6a-2p  . insulin lispro (HUMALOG,ADMELOG)  1-5 Units Subcutaneous MealsHS  . metoprolol  tartrate  50 mg Oral BID  . pantoprazole sodium  40 mg Oral Daily  . venlafaxine  HCl  75 mg Oral Daily    Visit Vitals BP 104/72 (BP Location: Right Upper Arm, Patient Position: Sitting)  Pulse 70  Temp 98.3 F (36.8 C) (Oral)  Resp 18     PHYSICAL EXAM: Admission weight:  Wt Readings from Last 3 Encounters:  06/03/24 216 lb (98 kg)   Temp Readings from Last 3 Encounters:  06/07/24 98.3 F (36.8 C) (Oral)   BP Readings from Last 3 Encounters:  06/07/24 104/72  11/04/21 (!) 167/85   Pulse Readings from Last 3 Encounters:  06/07/24 70  11/04/21 68   BMI: Body mass index is 30.13 kg/m.  General: Comfortable, no acute distress Neuro:  Oriented to time/person/place  Neck: No jugular venous distention  CV: Irregular rate and rhythm. No murmurs, rubs, or gallops. Lungs:Diminished in bases. Normal lung excursion. Abd: Soft, non tender. Normoactive BS; No HJR Ext: 2+ DP, PT and radial pulses bilaterally; no peripheral edema; no clubbing or cyanosis Skin: Warm and dry.     INTAKE AND OUTPUT:  Intake/Output Summary  (Last 24 hours) at 06/07/2024 1254 Last data filed at 06/07/2024 1131 Gross per 24 hour  Intake 1220 ml  Output 1600 ml  Net -380 ml    LABS: Lab Results  Component Value Date   Hemoglobin A1c 6.8 (H) 06/02/2024   Cholesterol, Total 185 05/07/2010   HDL 53 05/07/2010   LDL Calculated 107 (H) 05/07/2010   Triglycerides 124 05/07/2010   WBC 7.9 06/06/2024   HGB 13.3 (L) 06/06/2024   Plt Ct 198 06/06/2024   Creatinine 1.14 06/07/2024   BUN 41 (H) 06/07/2024   Na 134 (L) 06/07/2024   Potassium 3.4 (L) 06/07/2024   Cl 94 (L) 06/07/2024   CO2 28 06/07/2024   ALT 77 (H) 06/02/2024   AST 44 (H) 06/02/2024   ALK PHOS 96 06/02/2024   T Bili 0.6 06/02/2024   NT-ProBNP  Date Value Ref Range Status  06/02/2024 15,462 (H) <=1,799 pg/mL Final    Comment:    Among patients with dyspnea, NT-proBNPis highly sensitive for the detection of acute congestive heart failure.  In addition, a NT-proBNP <300 pg/mL effectively rules out acute congestive heart failure, with 98% negative predictive value.         Note: This documented was generated using voice recognition software. There may be unintended transcription errors that were not detected upon document review.  Electronically signed: Olam Jenkins Brewster, NP [facility] 06/07/2024 / 12:54 PM

## 2024-06-07 NOTE — Progress Notes (Signed)
 [facility] Case Management Discharge Note   Patient:   Jason [name] MR Number:  [id number] Patient Date of Birth: 1936/03/21 Age/Sex:  88 y.o./male   Discharge Plan   Case Management interviewed: Patient Disposition: Home                                           Discussed readiness, willingness and ability to provide or support self-management activities when needed after discharge from the acute care setting with: Patient       Transportation   Does the patient need discharge transport arranged?: No   Who is arranging transport?: his wife will provide tranportation Mode of transportation?: private vehicle    Accepted Agency   Selected Continued Care - Admitted Since 06/02/2024   No services have been selected for the patient.      The  level of care algorithm was used for appropriate DC planning.The pt will DC home with the Wife. Pt wife is at bedside to transport the pt home. Pt and Wife are denying needs when SW met with them at bedside. Please advise if needs arise.   Electronically signed: Ellouise Harsh, BSW, CSW  06/07/2024 12:53 PM

## 2024-06-08 ENCOUNTER — Telehealth: Payer: Self-pay

## 2024-06-08 DIAGNOSIS — I10 Essential (primary) hypertension: Secondary | ICD-10-CM

## 2024-06-08 NOTE — Transitions of Care (Post Inpatient/ED Visit) (Signed)
   06/08/2024  Name: Jason [name] MRN: [id number] DOB: 1936/04/16  Today's TOC FU Call Status: Today's TOC FU Call Status:: Unsuccessful Call (1st Attempt) Unsuccessful Call (1st Attempt) Date: 06/08/24  Attempted to reach the patient regarding the most recent Inpatient/ED visit.  Follow Up Plan: Additional outreach attempts will be made to reach the patient to complete the Transitions of Care (Post Inpatient/ED visit) call.   Alan Ee, RN, BSN, CEN [employer]- Transition of Care Team.  [facility] [phone]

## 2024-06-08 NOTE — Transitions of Care (Post Inpatient/ED Visit) (Signed)
 06/08/2024  Name: Jason [name] MRN: [id number] DOB: [birth year]  Today's TOC FU Call Status: Today's TOC FU Call Status:: Successful TOC FU Call Completed TOC FU Call Complete Date: 06/08/24 Patient's Name and Date of Birth confirmed.  Transition Care Management Follow-up Telephone Call- Phone call with wife who is on the [facility]. How have you been since you were released from the hospital?: Better (tired and weak. breathing is better) Any questions or concerns?: No  Items Reviewed: Did you receive and understand the discharge instructions provided?: Yes Medications obtained,verified, and reconciled?: Yes (Medications Reviewed) Any new allergies since your discharge?: No Dietary orders reviewed?: Yes Type of Diet Ordered:: low carb Do you have support at home?: Yes People in Home [RPT]: spouse Name of Support/Comfort Primary Source: wife,  Erminio  Medications Reviewed Today: Medications Reviewed Today     Reviewed by Rumalda Alan PENNER, RN (Registered Nurse) on 06/08/24 at 1419  Med List Status: <None>   Medication Order Taking? Sig Documenting Provider Last Dose Status Informant  allopurinol  (ZYLOPRIM ) 300 MG tablet [id number] Yes TAKE 1 TABLET BY MOUTH EVERY DAY McGowen, Aleene VEAR, MD  Active   apixaban (ELIQUIS) 5 MG TABS tablet [id number] Yes Take 5 mg by mouth 2 (two) times daily. [provider]  Active   aspirin EC 81 MG tablet [id number] Yes Take 1 tablet every day by oral route. [provider]  Active   atorvastatin  (LIPITOR) 40 MG tablet [id number] Yes TAKE 1 TABLET BY MOUTH EVERY DAY McGowen, Aleene VEAR, MD  Active   azithromycin  (ZITHROMAX ) 250 MG tablet [id number]  Take 1 tablet (250 mg total) by mouth daily. Take first 2 tablets together, then 1 every day until finished.  Patient not taking: Reported on 06/08/2024   Teddy Sharper, FNP  Active   Continuous Glucose Receiver (FREESTYLE LIBRE 3 READER) DEVI [id number] Yes Inject 1 each into the skin every 14  (fourteen) days. McGowen, Aleene VEAR, MD  Active   Continuous Glucose Sensor (FREESTYLE LIBRE 3 PLUS SENSOR) [place] [id number] Yes Change sensor every 15 days. McGowen, Philip H, MD  Active   fluticasone  (FLONASE ) 50 MCG/ACT nasal spray [id number]  Place 1-2 sprays into both nostrils daily.  Patient not taking: Reported on 06/08/2024   Levora Reyes SAUNDERS, MD  Active   furosemide (LASIX) 40 MG tablet [id number] Yes Take 40 mg by mouth 2 (two) times daily. [provider]  Consider Medication Status and Discontinue (Change in therapy)   furosemide (LASIX) 40 MG tablet [id number] Yes Take 40 mg by mouth daily. [provider]  Active   glucose blood test strip [id number]  E11.9 Use to test blood sugar twice daily or as needed Pavero, Christopher, [facility]  Active   HYDROcodone bit-homatropine (HYCODAN) 5-1.5 MG/5ML syrup [id number]  Take 5 mLs by mouth every 6 (six) hours as needed for cough.  Patient not taking: Reported on 06/08/2024   Ragan, Michael, FNP  Active   ipratropium (ATROVENT ) 0.03 % nasal spray [id number]  PLACE 2 SPRAYS INTO BOTH NOSTRILS EVERY 12 (TWELVE) HOURS. USE 15 TO 30 MIN PRIOR TO BREAKFAST AND SUPPER  Patient not taking: Reported on 06/08/2024   McGowen, Philip H, MD  Active   metFORMIN  (GLUCOPHAGE ) 500 MG tablet [id number] Yes Take 1 tablet (500 mg total) by mouth 2 (two) times daily with a meal. McGowen, Aleene VEAR, MD  Active   metoprolol  tartrate (LOPRESSOR ) 25 MG tablet [id number] Yes TAKE 1 TABLET BY MOUTH TWICE A DAY  Patient taking  differently: Take 25 mg by mouth daily.   Raford Riggs, MD  Active   metoprolol  tartrate (LOPRESSOR ) 25 MG tablet [id number] Yes Take 25 mg by mouth daily. [provider]  Active   midodrine  (PROAMATINE ) 10 MG tablet [id number]  TAKE 1 TABLET BEFORE GETTING OUT OF BED OR SOON AFTER, 1 TABLET AT 12PM, 1 TABLET AROUND 4PM  Patient not taking: Reported on 06/08/2024   Raford Riggs, MD  Active   Multiple Vitamins-Minerals  (ICAPS AREDS 2 PO) [id number] Yes TAKE 1 CAPSULE BY MOUTH TWICE A DAY FOR MACULAR DEGENERATION [provider]  Active   omeprazole  (PRILOSEC) 20 MG capsule [id number] Yes TAKE 1 CAPSULE BY MOUTH EVERY DAY McGowen, Aleene DEL, MD  Active   pioglitazone  (ACTOS ) 15 MG tablet [id number] Yes TAKE 1 TABLET (15 MG TOTAL) BY MOUTH DAILY. McGowen, Philip H, MD  Active   predniSONE  (DELTASONE ) 20 MG tablet [id number]  Take 3 tabs PO daily x 5 days.  Patient not taking: Reported on 06/08/2024   Ragan, Michael, FNP  Active   tirzepatide  (MOUNJARO ) 7.5 MG/0.5ML Pen [id number] Yes Inject 7.5 mg into the skin once a week.  Patient taking differently: Inject 7.5 mg into the skin once a week.   Candise Aleene DEL, MD  Active   venlafaxine  (EFFEXOR ) 75 MG tablet [id number] Yes TAKE 1 TABLET BY MOUTH EVERY DAY McGowen, Aleene DEL, MD  Active             Home Care and Equipment/Supplies: Were Home Health Services Ordered?: No Any new equipment or medical supplies ordered?: No  Functional Questionnaire: Do you need assistance with bathing/showering or dressing?: No Do you need assistance with meal preparation?: Yes (wife) Do you need assistance with eating?: No Do you have difficulty maintaining continence: No Do you need assistance with getting out of bed/getting out of a chair/moving?: Yes (wife and walker) Do you have difficulty managing or taking your medications?: No (wife manages medications)  Follow up appointments reviewed: PCP Follow-up appointment confirmed?: Yes Date of PCP follow-up appointment?: 06/12/24 Follow-up Provider: [facility] Follow-up appointment confirmed?: Yes Date of Specialist follow-up appointment?: 06/11/24 Follow-Up Specialty Provider:: Cardiology Do you need transportation to your follow-up appointment?: No Do you understand care options if your condition(s) worsen?: Yes-patient verbalized understanding  SDOH Interventions Today    Flowsheet Row Most  Recent Value  SDOH Interventions   Food Insecurity Interventions Intervention Not Indicated  Housing Interventions Intervention Not Indicated  Transportation Interventions Intervention Not Indicated  Utilities Interventions Intervention Not Indicated   Today's Vitals   06/08/24 1340 06/08/24 1341  BP:  108/67  Weight:  186 lb 9.6 oz (84.6 kg)  PainSc: 0-No pain 0-No pain    Goals Addressed             This Visit's Progress    VBCI Transitions of Care (TOC) Care Plan       Problems:  Recent Hospitalization for treatment of bilateral pleural effusions New onset of Afib Weight loss of 80 plus pounds- weak  Goal:  Over the next 30 days, the patient will not experience hospital readmission  Interventions:  Transitions of Care: Doctor Visits  - discussed the importance of doctor visits Post discharge activity limitations prescribed by provider reviewed Reviewed Signs and symptoms of infection Reviewed all medications and EMR updated. Confirned patient has hospital follow up planned with PCP and cardiology Reviewed DM diet and DM control Encouraged daily monitoring of VSS at home and recording.  Reviewed  recent A1c  Reviewed and offered 30 day TOC program and wife agreed. Provided my contact information Scheduled next TOC call This note sent to PCP AFIB Interventions:   Counseled on increased risk of stroke due to Afib and benefits of anticoagulation for stroke prevention Reviewed importance of adherence to anticoagulant exactly as prescribed Counseled on bleeding risk associated with eliquis and importance of self-monitoring for signs/symptoms of bleeding Counseled on importance of regular laboratory monitoring as prescribed Counseled on seeking medical attention after a head injury or if there is blood in the urine/stool Afib action plan reviewed Screening for signs and symptoms of depression related to chronic disease state  Assessed social determinant of health  barriers  Patient Self Care Activities:  Attend all scheduled provider appointments Call pharmacy for medication refills 3-7 days in advance of running out of medications Call provider office for new concerns or questions  Notify RN Care Manager of TOC call rescheduling needs Participate in Transition of Care Program/Attend TOC scheduled calls Take medications as prescribed   call office if I gain more than 2 pounds in one day or 5 pounds in one week track weight in diary use salt in moderation watch for swelling in feet, ankles and legs every day weigh myself daily bring diary to all appointments know when to call the doctor:for any changes in condition and weight gain make a plan to eat healthy keep all lab appointments take medicine as prescribed  Plan:  Telephone follow up appointment with care management team member scheduled for:  06/15/2024 at 10 am The patient has been provided with contact information for the care management team and has been advised to call with any health related questions or concerns.          Alan Ee, RN, BSN, CEN [employer]- Transition of Care Team.  [facility] [phone]

## 2024-06-12 ENCOUNTER — Encounter: Payer: Self-pay | Admitting: Family Medicine

## 2024-06-12 ENCOUNTER — Ambulatory Visit: Admitting: Family Medicine

## 2024-06-12 VITALS — BP 115/73 | HR 90 | Temp 96.9°F | Ht 71.0 in | Wt 190.4 lb

## 2024-06-12 DIAGNOSIS — J181 Lobar pneumonia, unspecified organism: Secondary | ICD-10-CM | POA: Diagnosis not present

## 2024-06-12 DIAGNOSIS — R0600 Dyspnea, unspecified: Secondary | ICD-10-CM | POA: Diagnosis not present

## 2024-06-12 DIAGNOSIS — I5021 Acute systolic (congestive) heart failure: Secondary | ICD-10-CM | POA: Diagnosis not present

## 2024-06-12 DIAGNOSIS — Z7984 Long term (current) use of oral hypoglycemic drugs: Secondary | ICD-10-CM | POA: Diagnosis not present

## 2024-06-12 DIAGNOSIS — R296 Repeated falls: Secondary | ICD-10-CM | POA: Diagnosis not present

## 2024-06-12 DIAGNOSIS — K1120 Sialoadenitis, unspecified: Secondary | ICD-10-CM

## 2024-06-12 DIAGNOSIS — R5381 Other malaise: Secondary | ICD-10-CM

## 2024-06-12 DIAGNOSIS — R197 Diarrhea, unspecified: Secondary | ICD-10-CM

## 2024-06-12 DIAGNOSIS — E119 Type 2 diabetes mellitus without complications: Secondary | ICD-10-CM | POA: Diagnosis not present

## 2024-06-12 DIAGNOSIS — I4819 Other persistent atrial fibrillation: Secondary | ICD-10-CM | POA: Diagnosis not present

## 2024-06-12 DIAGNOSIS — I4891 Unspecified atrial fibrillation: Secondary | ICD-10-CM

## 2024-06-12 DIAGNOSIS — Z8679 Personal history of other diseases of the circulatory system: Secondary | ICD-10-CM | POA: Diagnosis not present

## 2024-06-12 MED ORDER — CEPHALEXIN 500 MG PO CAPS: 500.0000 mg | ORAL_CAPSULE | Freq: Three times a day (TID) | ORAL | 0 refills | Status: DC

## 2024-06-12 NOTE — Patient Instructions (Addendum)
 Stop taking lasix for now.  I am checking a cbc, bmet, and BNP today.  I sent in a prescription for antibiotic (cephalexin) for your parotid gland

## 2024-06-12 NOTE — Progress Notes (Signed)
 06/12/2024  CC:  Chief Complaint  Patient presents with   Hospitalization Follow-up    Patient is a 88 y.o.  male who presents accompanied by his wife Erminio for hospital follow up, specifically Transitional Care Services face-to-face visit. Dates hospitalized: 10/18 to 06/07/2024. Days since d/c from hospital: 5 days Patient was discharged from hospital to home. Reason for admission to hospital: Shortness of breath and weakness.  Date of interactive (phone) contact with patient and/or caregiver: 06/08/2024.  I have reviewed patient's discharge summary plus pertinent specific notes, labs, and imaging from the hospitalization.  He had cough, shortness of breath, progressive generalized weakness, recurrent falls--> urgent care diagnosed him with pneumonia.  He did not improve significantly on antibiotics.  He was sent to the emergency room, was in rapid A-fib upon presentation. Labs showed sodium 130, serum creatinine 1.24, glucose 242, AST 44, ALT 77.  Troponin T mildly elevated but downward trend.  BNP and D dimer elevated. CT angio showed bilateral pleural effusions, 4.2 cm ascending thoracic aortic aneurysm, age-indeterminate L2 compression fracture, mild cardiomegaly, and coronary artery calcifications as well as cirrhosis.  No evidence of pulmonary embolism. Echocardiogram showed EF 30 to 35%, moderate MR, moderate TR.  This is decreased from an EF of 55 to 60% in April 2024.  He underwent thoracentesis and diuresis.  He had a Cardiolite stress test in the hospital which was negative for ischemia.  He was put on low-dose Toprol  but Sim was not started due to soft blood pressures. Due to new diagnosis A-fib he was started on Eliquis. 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  (06/07/2024 labs--> sodium 134, potassium 3.4, BUN 41, creatinine 1.14, hemoglobin 13.3, white blood cell 7.9, platelet 198 Thoracentesis fluid LDH 60, reactive/degenerated mesothelial cells,  macrophages and mixed inflammation.  Negative for malignancy. proBNP 15,462 on 06/02/2024.  D-dimer 2.09 on 06/02/2024.)  CURRENTLY: Very tired.  He can ambulate very slowly with a walker.  He had a fall onto his knees after he got home, paramedics had to be called to come pick him up.  No injury sustained. He has had a little bit of nosebleed and some bruising since getting on Eliquis.  No blood in stool or urine. Today he started to have some loose stools.  He has poor p.o. intake.  No nausea, vomiting, or abdominal pain. No fevers. No shortness of breath or chest pain or palpitations or sensation of heart racing.  No significant dizziness.  Of note, just after getting out of the hospital he noticed a swelling in the right mandibular area.  It does not hurt.  He can chew and swallow without problem.  Salivation does not make it hurt.   Medication reconciliation was done today and patient is taking meds as recommended by discharging hospitalist/specialist.   Discharge medication list: Eliquis 5 mg twice daily, Lasix 40 mg daily, Toprol -XL 25 mg daily, allopurinol  300 mg daily, aspirin 81 mg daily, atorvastatin  40 mg daily, Nexium 40 mg daily, Hycodan syrup every 6 hours as needed cough, metformin  1000 mg twice daily, pioglitazone  15 mg daily, Mounjaro  7.5 mg weekly, venlafaxine  75 mg daily.   He was instructed to stop taking midodrine , omeprazole , Lopressor , prednisone , and azithromycin .  PMH:  Past Medical History:  Diagnosis Date   Anxiety and depression    Ascending aortic aneurysm 01/22/2022   Bilateral shoulder pain, unspecified chronicity    XR 10/2023-->+Bilat RC tendonopathy+AC arth and mild GH arth on R.  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  DEXA -1.0 04/2023   Orthostatic hypotension    OSA (obstructive sleep apnea)    Osteoarthritis of left hip    + troch burs   Prostate cancer (HCC)    1990-->prostatectomy, released from urol x many years   Urinary incontinence     PSH:  Past Surgical History:  Procedure Laterality Date   CHOLECYSTECTOMY, LAPAROSCOPIC     1992   DEXA     T score -1.0 Sept 2024   ROBOT ASSISTED LAPAROSCOPIC RADICAL PROSTATECTOMY     1999   ROTATOR CUFF REPAIR     Left 1991, right 1997   TOTAL KNEE ARTHROPLASTY     bilat 2004   TRANSTHORACIC ECHOCARDIOGRAM     11/24/22, EF nl, +LVH with no WMA, grd I DD, valves ok (no amyloid changes)    MEDS:  Outpatient Medications Prior to Visit  Medication Sig Dispense Refill   allopurinol  (ZYLOPRIM ) 300 MG tablet TAKE 1 TABLET BY MOUTH EVERY DAY 90 tablet 0   apixaban (ELIQUIS) 5 MG TABS tablet Take 5 mg by mouth 2 (two) times daily.     aspirin EC 81 MG tablet Take 1 tablet every day by oral route.     atorvastatin  (LIPITOR) 40 MG tablet TAKE 1 TABLET BY MOUTH EVERY DAY 90 tablet 0   Continuous Glucose Receiver (FREESTYLE LIBRE 3 READER) DEVI Inject 1 each into the skin every 14 (fourteen) days. 2 each 2   Continuous Glucose Sensor (FREESTYLE LIBRE 3 PLUS SENSOR) MISC Change sensor every 15 days. 2 each 6   furosemide (LASIX) 40 MG tablet Take 40 mg by mouth 2 (two) times daily.     furosemide (LASIX) 40 MG tablet Take 40 mg by mouth daily.     glucose blood test strip E11.9 Use to test blood sugar twice daily or as needed 100 each 12   metFORMIN  (GLUCOPHAGE ) 500 MG tablet Take 1 tablet (500 mg total) by mouth 2 (two) times daily with a meal.     metoprolol  succinate (TOPROL -XL) 25 MG 24 hr tablet Take 25 mg by mouth daily.     metoprolol  tartrate (LOPRESSOR ) 25 MG tablet Take 25 mg by mouth daily.     midodrine  (PROAMATINE ) 10 MG tablet TAKE 1 TABLET BEFORE GETTING OUT  OF BED OR SOON AFTER, 1 TABLET AT 12PM, 1 TABLET AROUND 4PM 270 tablet 2   Multiple Vitamins-Minerals (ICAPS AREDS 2 PO) TAKE 1 CAPSULE BY MOUTH TWICE A DAY FOR MACULAR DEGENERATION     omeprazole  (PRILOSEC) 20 MG capsule TAKE 1 CAPSULE BY MOUTH EVERY DAY 90 capsule 1   pioglitazone  (ACTOS ) 15 MG tablet TAKE 1 TABLET (15 MG TOTAL) BY MOUTH DAILY. 90 tablet 0   tirzepatide  (MOUNJARO ) 7.5 MG/0.5ML Pen Inject 7.5 mg into the skin once a week. (Patient taking differently: Inject 7.5 mg into the skin once a week.)     venlafaxine  (EFFEXOR ) 75 MG tablet TAKE 1 TABLET BY MOUTH EVERY DAY 90 tablet 0   azithromycin  (ZITHROMAX ) 250 MG tablet Take 1 tablet (250 mg total) by mouth daily. Take first 2 tablets together,  then 1 every day until finished. (Patient not taking: Reported on 06/08/2024) 6 tablet 0   fluticasone  (FLONASE ) 50 MCG/ACT nasal spray Place 1-2 sprays into both nostrils daily. (Patient not taking: Reported on 06/12/2024) 16 g 6   HYDROcodone bit-homatropine (HYCODAN) 5-1.5 MG/5ML syrup Take 5 mLs by mouth every 6 (six) hours as needed for cough. (Patient not taking: Reported on 06/08/2024) 120 mL 0   ipratropium (ATROVENT ) 0.03 % nasal spray PLACE 2 SPRAYS INTO BOTH NOSTRILS EVERY 12 (TWELVE) HOURS. USE 15 TO 30 MIN PRIOR TO BREAKFAST AND SUPPER (Patient not taking: Reported on 06/12/2024) 30 mL 6   metoprolol  tartrate (LOPRESSOR ) 25 MG tablet TAKE 1 TABLET BY MOUTH TWICE A DAY (Patient not taking: Reported on 06/12/2024) 180 tablet 3   predniSONE  (DELTASONE ) 20 MG tablet Take 3 tabs PO daily x 5 days. (Patient not taking: Reported on 06/08/2024) 15 tablet 0   No facility-administered medications prior to visit.    Physical Exam    06/12/2024   11:14 AM 06/08/2024    1:41 PM 05/28/2024    3:04 PM  Vitals with BMI  Height 5' 11    Weight 190 lbs 6 oz 186 lbs 10 oz   BMI 26.57    Systolic 115 108 848  Diastolic 73 67 84  Pulse 90  99   General: Alert, chronically ill-appearing.  No  acute distress. He is oriented x 4 and has lucid thought and speech. No significant pallor.  No jaundice. The right parotid gland region has a focal 3 to 4 cm subcu nodule/mass that is rubbery, nontender, and mobile. It does not feel fluctuant and has no overlying erythema.  I do not feel any submandibular or anterior cervical lymphadenopathy. Oropharyngeal mucosa very dry, no erythema or swelling. Cardiovascular: Irregularly irregular, rate approximately 85-90, no murmur or rub. Lungs: Clear to auscultation bilaterally, with decreased breath sounds in both bases, further up on left than right.  Breathing is nonlabored.  No wheezing or prolongation of the expiratory phase. Extremities: No edema.  Pertinent labs/imaging Last CBC Lab Results  Component Value Date   WBC 12.5 10/17/2023   HGB 14.6 10/17/2023   HCT 45 10/17/2023   MCV 90.3 05/06/2023   MCH 29.0 05/06/2023   RDW 13.8 05/06/2023   PLT 276 05/06/2023   Last metabolic panel Lab Results  Component Value Date   GLUCOSE 165 (H) 04/17/2024   NA 140 04/17/2024   K 4.6 04/17/2024   CL 102 04/17/2024   CO2 25 04/17/2024   BUN 24 04/17/2024   CREATININE 1.15 04/17/2024   EGFR 62 04/17/2024   CALCIUM  9.5 04/17/2024   PROT 6.7 04/17/2024   ALBUMIN 4.5 10/17/2023   BILITOT 0.7 04/17/2024   ALKPHOS 94 10/17/2023   AST 21 04/17/2024   ALT 15 04/17/2024   Lab Results  Component Value Date   HGBA1C 6.1 (A) 04/17/2024   HGBA1C 6.1 04/17/2024   HGBA1C 6.1 04/17/2024   HGBA1C 6.1 04/17/2024   ASSESSMENT/PLAN:  #1 new onset atrial fibrillation with rapid ventricular response. Rate is relatively well-controlled on 25 mg Toprol -XL daily. So far so good on Eliquis 5 mg twice daily. We did discuss in detail the potential benefits and risks of this medication, particularly in a patient who has a high risk of falls. At this point we chose to continue the medication and he will follow-up with cardiology this afternoon.  #2 acute  CHF. No sign of volume overload. He has breath sounds indicative  of residual effusion but it does not sound like this has progressed any since having the thoracentesis in the hospital. Continue Toprol  XL 25 mg a day.  I think he is dehydrated so I will have him temporarily hold the Lasix. Monitor electrolytes and creatinine today.  #3 lobar pneumonia.  This was the trigger for all of his subsequent illness. He has completed a course of antibiotics.  4.  New right parotid gland swelling. Acute onset plus exam features today do not suggest malignancy. Will treat as infection and see how this goes over the next 1 week.  If not improved will do imaging.  Sour hard candy encouraged. Check CBC today.  5.  Orthostatic hypotension, history of. He is off midodrine  since the hospitalization.  #6 diabetes without complication, well-controlled. Hemoglobin A1c 6.8% in the hospital.  Hold off on starting Mounjaro  in the setting of his weight loss/acute illness lately.  Okay to continue metformin  500 mg twice daily. Plan repeat hemoglobin A1c in 3 months.  #7 diarrhea. This just started today.  Observe.  8.  Debilitated patient, unsteady gait, recurrent falls. See #1 above regarding discussion about risks of anticoagulation in this setting. Will set up home health PT and nursing.  Medical decision making of high complexity was utilized today.  FOLLOW UP: 1 week  Signed:  Gerlene Hockey, MD           06/12/2024

## 2024-06-13 ENCOUNTER — Ambulatory Visit: Payer: Self-pay | Admitting: Family Medicine

## 2024-06-13 LAB — BASIC METABOLIC PANEL WITH GFR
BUN/Creatinine Ratio: 29 (calc) — ABNORMAL HIGH (ref 6–22)
BUN: 40 mg/dL — ABNORMAL HIGH (ref 7–25)
CO2: 28 mmol/L (ref 20–32)
Calcium: 9.4 mg/dL (ref 8.6–10.3)
Chloride: 92 mmol/L — ABNORMAL LOW (ref 98–110)
Creat: 1.37 mg/dL — ABNORMAL HIGH (ref 0.70–1.22)
Glucose, Bld: 174 mg/dL — ABNORMAL HIGH (ref 65–99)
Potassium: 4.2 mmol/L (ref 3.5–5.3)
Sodium: 137 mmol/L (ref 135–146)
eGFR: 50 mL/min/1.73m2 — ABNORMAL LOW (ref >=60)

## 2024-06-13 LAB — CBC WITH DIFFERENTIAL/PLATELET
Absolute Lymphocytes: 1724 {cells}/uL (ref 850–3900)
Absolute Monocytes: 770 {cells}/uL (ref 200–950)
Basophils Absolute: 30 {cells}/uL (ref 0–200)
Basophils Relative: 0.4 %
Eosinophils Absolute: 163 {cells}/uL (ref 15–500)
Eosinophils Relative: 2.2 %
HCT: 43 % (ref 38.5–50.0)
Hemoglobin: 13.8 g/dL (ref 13.2–17.1)
MCH: 29.8 pg (ref 27.0–33.0)
MCHC: 32.1 g/dL (ref 32.0–36.0)
MCV: 92.9 fL (ref 80.0–100.0)
MPV: 11.3 fL (ref 7.5–12.5)
Monocytes Relative: 10.4 %
Neutro Abs: 4714 {cells}/uL (ref 1500–7800)
Neutrophils Relative %: 63.7 %
Platelets: 255 Thousand/uL (ref 140–400)
RBC: 4.63 Million/uL (ref 4.20–5.80)
RDW: 13.4 % (ref 11.0–15.0)
Total Lymphocyte: 23.3 %
WBC: 7.4 Thousand/uL (ref 3.8–10.8)

## 2024-06-13 LAB — BRAIN NATRIURETIC PEPTIDE: Brain Natriuretic Peptide: 167 pg/mL — ABNORMAL HIGH (ref <100)

## 2024-06-13 NOTE — Addendum Note (Signed)
 Addended by: FLETA CARE D on: 06/13/2024 08:41 AM   Modules accepted: Orders

## 2024-06-15 ENCOUNTER — Other Ambulatory Visit: Payer: Self-pay

## 2024-06-15 NOTE — Patient Instructions (Signed)
 Visit Information  Thank you for taking time to visit with me today. Please don't hesitate to contact me if I can be of assistance to you before our next scheduled telephone appointment.  Following are the goals we discussed today:   Goals Addressed             This Visit's Progress    [facility] Transitions of Care (TOC) Care Plan       Problems:  Recent Hospitalization for treatment of bilateral pleural effusions 06/15/2024  Patient reports that he is feeling better. Still very weak.  No shortness of breath at this time New onset of Afib 06/15/2024  Denies any irregular heart beat at this time.  Taking medications as prescribed. No bleeding. Weight loss of 80 plus pounds- weak 06/15/2024  Weight today of 187.  Additional weight loss since hospital discharge.   Goal:  Over the next 30 days, the patient will not experience hospital readmission  Interventions:  Transitions of Care: Doctor Visits  - discussed the importance of doctor visits Post discharge activity limitations prescribed by provider reviewed- encouraged patient to walk more to but up energy.  Reviewed Signs and symptoms of infection Reviewed all medications and EMR updated. Reviewed post hospital cardiology and PCP visits.  Encouraged daily monitoring of VSS at home and recording.  Scheduled next TOC call  AFIB Interventions:   Counseled on increased risk of stroke due to Afib and benefits of anticoagulation for stroke prevention Reviewed importance of adherence to anticoagulant exactly as prescribed Counseled on bleeding risk associated with eliquis and importance of self-monitoring for signs/symptoms of bleeding Counseled on importance of regular laboratory monitoring as prescribed Counseled on seeking medical attention after a head injury or if there is blood in the urine/stool Afib action plan reviewed Reviewed no episodes of Afib that patient has notice since hospital discharge.  Reviewed action plan for episodes  of Afib and when to call 911.   Patient Self Care Activities:  Attend all scheduled provider appointments Call pharmacy for medication refills 3-7 days in advance of running out of medications Call provider office for new concerns or questions  Notify RN Care Manager of TOC call rescheduling needs Participate in Transition of Care Program/Attend TOC scheduled calls Take medications as prescribed   call office if I gain more than 2 pounds in one day or 5 pounds in one week track weight in diary use salt in moderation watch for swelling in feet, ankles and legs every day weigh myself daily bring diary to all appointments know when to call the doctor:for any changes in condition and weight gain make a plan to eat healthy keep all lab appointments take medicine as prescribed Finish antibiotics  Plan:  Telephone follow up appointment with care management team member scheduled for:  06/22/2024 at 11am The patient has been provided with contact information for the care management team and has been advised to call with any health related questions or concerns.          Our next appointment is by telephone on 06/22/2024 at 11am  Please call the care guide team at [phone] if you need to cancel or reschedule your appointment.   If you are experiencing a Mental Health or Behavioral Health Crisis or need someone to talk to, please call the Suicide and Crisis Lifeline: 988 call the [place]  National Suicide Prevention Lifeline: 215-808-3136 or TTY: 678 373 0181 TTY 214-105-5718) to talk to a trained counselor call 1-800-273-TALK (toll free, 24 hour hotline) call 911  Care plan and visit instructions communicated with the patient verbally today. Patient agrees to receive a copy in MyChart. Active MyChart status and patient understanding of how to access instructions and care plan via MyChart confirmed with patient.     Alan Ee, RN, BSN, CEN [employer]- Transition of Care  Team.  [facility] [phone]

## 2024-06-15 NOTE — Transitions of Care (Post Inpatient/ED Visit) (Signed)
 Transition of Care week 2  Visit Note  06/15/2024  Name: Jason [name] MRN: [id number]          DOB: [birth year]  Situation: Patient enrolled in [facility] 30-day program. Visit completed with patient by telephone.   Background:   Initial Transition Care Management Follow-up Telephone Call Discharge Date and Diagnosis: 06/07/24, Bilateral pleural effusions   Past Medical History:  Diagnosis Date   Anxiety and depression    Ascending aortic aneurysm 01/22/2022   Bilateral shoulder pain, unspecified chronicity    XR 10/2023-->+Bilat RC tendonopathy+AC arth and mild GH arth on R.  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  DEXA -1.0 04/2023   Orthostatic hypotension    OSA (obstructive sleep apnea)    Osteoarthritis of left hip    + troch burs   Prostate cancer (HCC)    1990-->prostatectomy, released from urol x many years   Urinary incontinence     Assessment: Patient reports that he continues to be weak.  Reports additional weight loss. Lasix on hold.  Reports that he feels like he is eating a little better. Drinking ensure.  Patient Reported Symptoms: Cognitive Cognitive Status: Able to follow simple commands, Alert and oriented to person, place, and time, Normal speech and language skills (voice sounds stronger)      Neurological Neurological Review of Symptoms: Weakness Neurological Management Strategies: Routine screening Neurological Comment: continues to have generalized weakness. Drinking ensure supplements.  HEENT HEENT Symptoms Reported: Not assessed      Cardiovascular Cardiovascular Symptoms Reported: Fatigue Does patient have uncontrolled Hypertension?:  No Weight: 187 lb (84.8 kg) (home monitoring) Cardiovascular Comment: denies any feelings of afib, denies swelling. denies shortness of breath. has had cardiology follow up  Respiratory Respiratory Symptoms Reported: No symptoms reported    Endocrine Endocrine Symptoms Reported: No symptoms reported Is patient diabetic?: Yes Is patient checking blood sugars at home?: Yes List most recent blood sugar readings, include date and time of day: no reading at this time Endocrine Self-Management Outcome: 5 (very good)  Gastrointestinal Gastrointestinal Symptoms Reported: Constipation Additional Gastrointestinal Details: LBM 2 days ago.  Patient is being observant of bowels Gastrointestinal Management Strategies: Fluid modification Gastrointestinal Self-Management Outcome: 5 (very good)    Genitourinary Genitourinary Symptoms Reported: No symptoms reported    Integumentary Integumentary Symptoms Reported: No symptoms reported    Musculoskeletal Musculoskelatal Symptoms Reviewed: Difficulty walking Additional Musculoskeletal Details: continues to use walker Musculoskeletal Management Strategies: Medical device      Psychosocial Psychosocial Symptoms Reported: Not assessed         Today's Vitals   06/15/24 1038 06/15/24 1039  Weight: 187 lb (84.8 kg) 187 lb (84.8 kg)  PainSc: 0-No pain 0-No pain     Medications Reviewed Today     Reviewed by Rumalda Alan PENNER, RN (Registered Nurse) on 06/15/24 at 1032  Med List Status: <None>   Medication Order Taking? Sig Documenting Provider Last Dose Status Informant  allopurinol  (ZYLOPRIM ) 300 MG tablet [id number] Yes TAKE 1 TABLET BY MOUTH EVERY DAY McGowen, Aleene VEAR, MD  Active   apixaban (ELIQUIS) 5 MG TABS tablet [id number] Yes Take 5 mg by  mouth 2 (two) times daily. [provider]  Active   aspirin EC 81 MG tablet [id number] Yes Take 1 tablet every day by oral route. [provider]  Active   atorvastatin  (LIPITOR) 40 MG tablet  [id number] Yes TAKE 1 TABLET BY MOUTH EVERY DAY McGowen, Aleene DEL, MD  Active   cephALEXin (KEFLEX) 500 MG capsule [id number] Yes Take 1 capsule (500 mg total) by mouth 3 (three) times daily. McGowen, Aleene DEL, MD  Active   Continuous Glucose Receiver (FREESTYLE LIBRE 3 READER) [place] [id number] Yes Inject 1 each into the skin every 14 (fourteen) days. McGowen, Aleene DEL, MD  Active   Continuous Glucose Sensor (FREESTYLE LIBRE 3 PLUS SENSOR) [place] [id number] Yes Change sensor every 15 days. McGowen, Philip H, MD  Active   furosemide (LASIX) 40 MG tablet [id number]  Take 40 mg by mouth 2 (two) times daily.  Patient not taking: Reported on 06/15/2024   [provider]  Active   furosemide (LASIX) 40 MG tablet [id number] Yes Take 40 mg by mouth daily. [provider]  Active   glucose blood test strip [id number] Yes E11.9 Use to test blood sugar twice daily or as needed Pavero, Christopher, [facility]  Active   metFORMIN  (GLUCOPHAGE ) 500 MG tablet [id number] Yes Take 1 tablet (500 mg total) by mouth 2 (two) times daily with a meal. McGowen, Aleene DEL, MD  Active   metoprolol  succinate (TOPROL -XL) 25 MG 24 hr tablet [id number] Yes Take 25 mg by mouth daily. [provider]  Active   metoprolol  tartrate (LOPRESSOR ) 25 MG tablet [id number] Yes Take 25 mg by mouth daily. [provider]  Active   midodrine  (PROAMATINE ) 10 MG tablet [id number] Yes TAKE 1 TABLET BEFORE GETTING OUT OF BED OR SOON AFTER, 1 TABLET AT 12PM, 1 TABLET AROUND 4PM Raford Riggs, MD  Active   Multiple Vitamins-Minerals (ICAPS AREDS 2 PO) [id number] Yes TAKE 1 CAPSULE BY MOUTH TWICE A DAY FOR MACULAR DEGENERATION [provider]  Active   omeprazole  (PRILOSEC) 20 MG capsule [id number] Yes TAKE 1 CAPSULE BY MOUTH EVERY DAY McGowen, Aleene DEL, MD  Active   pioglitazone  (ACTOS ) 15 MG tablet [id number] Yes TAKE 1 TABLET (15 MG TOTAL) BY MOUTH DAILY. McGowen, Philip H, MD  Active   tirzepatide  (MOUNJARO ) 7.5 MG/0.5ML  Pen [id number] Yes Inject 7.5 mg into the skin once a week.  Patient taking differently: Inject 7.5 mg into the skin once a week.   McGowen, Philip H, MD  Active   venlafaxine  (EFFEXOR ) 75 MG tablet [id number] Yes TAKE 1 TABLET BY MOUTH EVERY DAY McGowen, Aleene DEL, MD  Active             Goals Addressed             This Visit's Progress    [facility] Transitions of Care (TOC) Care Plan       Problems:  Recent Hospitalization for treatment of bilateral pleural effusions 06/15/2024  Patient reports that he is feeling better. Still very weak.  No shortness of breath at this time New onset of Afib 06/15/2024  Denies any irregular heart beat at this time.  Taking medications as prescribed. No bleeding. Weight loss of 80 plus pounds- weak 06/15/2024  Weight today of 187.  Additional weight loss since hospital discharge.   Goal:  Over the next 30 days, the patient will not experience hospital readmission  Interventions:  Transitions of Care: Doctor Visits  - discussed the importance of doctor visits Post  discharge activity limitations prescribed by provider reviewed- encouraged patient to walk more to but up energy.  Reviewed Signs and symptoms of infection Reviewed all medications and EMR updated. Reviewed post hospital cardiology and PCP visits.  Encouraged daily monitoring of VSS at home and recording.  Scheduled next TOC call  AFIB Interventions:   Counseled on increased risk of stroke due to Afib and benefits of anticoagulation for stroke prevention Reviewed importance of adherence to anticoagulant exactly as prescribed Counseled on bleeding risk associated with eliquis and importance of self-monitoring for signs/symptoms of bleeding Counseled on importance of regular laboratory monitoring as prescribed Counseled on seeking medical attention after a head injury or if there is blood in the urine/stool Afib action plan reviewed Reviewed no episodes of Afib that patient has notice since  hospital discharge.  Reviewed action plan for episodes of Afib and when to call 911.   Patient Self Care Activities:  Attend all scheduled provider appointments Call pharmacy for medication refills 3-7 days in advance of running out of medications Call provider office for new concerns or questions  Notify RN Care Manager of TOC call rescheduling needs Participate in Transition of Care Program/Attend TOC scheduled calls Take medications as prescribed   call office if I gain more than 2 pounds in one day or 5 pounds in one week track weight in diary use salt in moderation watch for swelling in feet, ankles and legs every day weigh myself daily bring diary to all appointments know when to call the doctor:for any changes in condition and weight gain make a plan to eat healthy keep all lab appointments take medicine as prescribed Finish antibiotics  Plan:  Telephone follow up appointment with care management team member scheduled for:  06/22/2024 at 11am The patient has been provided with contact information for the care management team and has been advised to call with any health related questions or concerns.         Recommendation:   Continue Current Plan of Care  Follow Up Plan:   Telephone follow up appointment date/time:  06/22/2024 at 1100  Alan Ee, RN, BSN, [employer]- Transition of Care Team.  [facility] [phone]

## 2024-06-18 ENCOUNTER — Encounter: Payer: Self-pay | Admitting: Radiology

## 2024-06-19 DIAGNOSIS — I081 Rheumatic disorders of both mitral and tricuspid valves: Secondary | ICD-10-CM | POA: Diagnosis not present

## 2024-06-19 DIAGNOSIS — J181 Lobar pneumonia, unspecified organism: Secondary | ICD-10-CM | POA: Diagnosis not present

## 2024-06-19 DIAGNOSIS — I130 Hypertensive heart and chronic kidney disease with heart failure and stage 1 through stage 4 chronic kidney disease, or unspecified chronic kidney disease: Secondary | ICD-10-CM | POA: Diagnosis not present

## 2024-06-19 DIAGNOSIS — E1122 Type 2 diabetes mellitus with diabetic chronic kidney disease: Secondary | ICD-10-CM | POA: Diagnosis not present

## 2024-06-19 DIAGNOSIS — Z7984 Long term (current) use of oral hypoglycemic drugs: Secondary | ICD-10-CM | POA: Diagnosis not present

## 2024-06-19 DIAGNOSIS — M1612 Unilateral primary osteoarthritis, left hip: Secondary | ICD-10-CM | POA: Diagnosis not present

## 2024-06-19 DIAGNOSIS — E876 Hypokalemia: Secondary | ICD-10-CM | POA: Diagnosis not present

## 2024-06-19 DIAGNOSIS — I712 Thoracic aortic aneurysm, without rupture, unspecified: Secondary | ICD-10-CM | POA: Diagnosis not present

## 2024-06-19 DIAGNOSIS — I2510 Atherosclerotic heart disease of native coronary artery without angina pectoris: Secondary | ICD-10-CM | POA: Diagnosis not present

## 2024-06-19 DIAGNOSIS — Z7982 Long term (current) use of aspirin: Secondary | ICD-10-CM | POA: Diagnosis not present

## 2024-06-19 DIAGNOSIS — Z6826 Body mass index (BMI) 26.0-26.9, adult: Secondary | ICD-10-CM | POA: Diagnosis not present

## 2024-06-19 DIAGNOSIS — K7460 Unspecified cirrhosis of liver: Secondary | ICD-10-CM | POA: Diagnosis not present

## 2024-06-19 DIAGNOSIS — Z7901 Long term (current) use of anticoagulants: Secondary | ICD-10-CM | POA: Diagnosis not present

## 2024-06-19 DIAGNOSIS — N1831 Chronic kidney disease, stage 3a: Secondary | ICD-10-CM | POA: Diagnosis not present

## 2024-06-19 DIAGNOSIS — E663 Overweight: Secondary | ICD-10-CM | POA: Diagnosis not present

## 2024-06-19 DIAGNOSIS — F411 Generalized anxiety disorder: Secondary | ICD-10-CM | POA: Diagnosis not present

## 2024-06-19 DIAGNOSIS — E7800 Pure hypercholesterolemia, unspecified: Secondary | ICD-10-CM | POA: Diagnosis not present

## 2024-06-19 DIAGNOSIS — G4733 Obstructive sleep apnea (adult) (pediatric): Secondary | ICD-10-CM | POA: Diagnosis not present

## 2024-06-19 DIAGNOSIS — I7121 Aneurysm of the ascending aorta, without rupture: Secondary | ICD-10-CM | POA: Diagnosis not present

## 2024-06-19 DIAGNOSIS — I5021 Acute systolic (congestive) heart failure: Secondary | ICD-10-CM | POA: Diagnosis not present

## 2024-06-19 DIAGNOSIS — I951 Orthostatic hypotension: Secondary | ICD-10-CM | POA: Diagnosis not present

## 2024-06-19 DIAGNOSIS — I4891 Unspecified atrial fibrillation: Secondary | ICD-10-CM | POA: Diagnosis not present

## 2024-06-19 DIAGNOSIS — F32A Depression, unspecified: Secondary | ICD-10-CM | POA: Diagnosis not present

## 2024-06-19 DIAGNOSIS — K219 Gastro-esophageal reflux disease without esophagitis: Secondary | ICD-10-CM | POA: Diagnosis not present

## 2024-06-19 DIAGNOSIS — M1030 Gout due to renal impairment, unspecified site: Secondary | ICD-10-CM | POA: Diagnosis not present

## 2024-06-21 ENCOUNTER — Encounter: Payer: Self-pay | Admitting: Family Medicine

## 2024-06-21 ENCOUNTER — Ambulatory Visit (INDEPENDENT_AMBULATORY_CARE_PROVIDER_SITE_OTHER): Admitting: Family Medicine

## 2024-06-21 VITALS — BP 106/72 | HR 73 | Temp 96.8°F | Ht 71.0 in | Wt 196.8 lb

## 2024-06-21 DIAGNOSIS — E669 Obesity, unspecified: Secondary | ICD-10-CM

## 2024-06-21 DIAGNOSIS — I5021 Acute systolic (congestive) heart failure: Secondary | ICD-10-CM | POA: Diagnosis not present

## 2024-06-21 DIAGNOSIS — E119 Type 2 diabetes mellitus without complications: Secondary | ICD-10-CM | POA: Diagnosis not present

## 2024-06-21 DIAGNOSIS — R600 Localized edema: Secondary | ICD-10-CM | POA: Diagnosis not present

## 2024-06-21 DIAGNOSIS — N2889 Other specified disorders of kidney and ureter: Secondary | ICD-10-CM | POA: Diagnosis not present

## 2024-06-21 DIAGNOSIS — R5381 Other malaise: Secondary | ICD-10-CM | POA: Diagnosis not present

## 2024-06-21 DIAGNOSIS — Z7901 Long term (current) use of anticoagulants: Secondary | ICD-10-CM

## 2024-06-21 DIAGNOSIS — I4891 Unspecified atrial fibrillation: Secondary | ICD-10-CM

## 2024-06-21 MED ORDER — METFORMIN HCL 500 MG PO TABS: 500.0000 mg | ORAL_TABLET | Freq: Two times a day (BID) | ORAL | Status: DC

## 2024-06-21 MED ORDER — EMPAGLIFLOZIN 10 MG PO TABS: 10.0000 mg | ORAL_TABLET | Freq: Every day | ORAL | 1 refills | Status: AC

## 2024-06-21 NOTE — Patient Instructions (Signed)
 Continue to stay off Lasix for now. You may take a 40 mg tab if you gain 3 pounds in 1 day or 5 pounds in 1 week. If you have to do this please notify our office or make an appointment to see me again.  Decrease your metformin  to one of the 500 mg tabs twice a day.

## 2024-06-21 NOTE — Telephone Encounter (Signed)
 All fine. No new recommendations.

## 2024-06-21 NOTE — Progress Notes (Signed)
 OFFICE VISIT  06/21/2024  CC:  Chief Complaint  Patient presents with   Medical Management of Chronic Issues    1 week f/u a-fib, CHF; Home Health [facility])   Patient is a 88 y.o. male who presents accompanied by his daughter and wife for 1 week follow-up right parotid gland swelling, debilitated patient, CHF, and atrial fibrillation. A/P as of last visit: #1 new onset atrial fibrillation with rapid ventricular response. Rate is relatively well-controlled on 25 mg Toprol -XL daily. So far so good on Eliquis 5 mg twice daily. We did discuss in detail the potential benefits and risks of this medication, particularly in a patient who has a high risk of falls. At this point we chose to continue the medication and he will follow-up with cardiology this afternoon.   #2 acute CHF. No sign of volume overload. He has breath sounds indicative of residual effusion but it does not sound like this has progressed any since having the thoracentesis in the hospital. Continue Toprol  XL 25 mg a day.  I think he is dehydrated so I will have him temporarily hold the Lasix. Monitor electrolytes and creatinine today.   #3 lobar pneumonia.  This was the trigger for all of his subsequent illness. He has completed a course of antibiotics.   4.  New right parotid gland swelling. Acute onset plus exam features today do not suggest malignancy. Will treat as infection and see how this goes over the next 1 week.  If not improved will do imaging.  Sour hard candy encouraged. Check CBC today.   5.  Orthostatic hypotension, history of. He is off midodrine  since the hospitalization.   #6 diabetes without complication, well-controlled. Hemoglobin A1c 6.8% in the hospital.  Hold off on starting Mounjaro  in the setting of his weight loss/acute illness lately.  Okay to continue metformin  500 mg twice daily. Plan repeat hemoglobin A1c in 3 months.   #7 diarrhea. This just started today.  Observe.   8.   Debilitated patient, unsteady gait, recurrent falls. See #1 above regarding discussion about risks of anticoagulation in this setting. Will set up home health PT and nursing.  INTERIM HX: Labs last week showed stable kidney function, normal blood counts, normal electrolytes.  I recommended that he continue to stay off Lasix until I see him today.  M[place]den continues to gradually feel better--> energy level coming back some, appetite coming back some.  He drinks 2 Ensure per day.  He has no pain and no shortness of breath.  He walks with a walker. No swelling in the legs. His parotid gland swelling has completely resolved.  He had an echocardiogram and cardiology office follow-up since I last saw him.  Ejection fraction was 30 to 35%.  He continued off the Lasix. There is plan for DCCV after he has been on the Eliquis for at least 4 weeks.  ROS as above, plus--> no fevers, no CP, no wheezing, no cough, no dizziness, no HAs, no rashes, no melena/hematochezia.  No polyuria or polydipsia.  No myalgias or arthralgias.  No focal weakness, paresthesias, or tremors.  No acute vision or hearing abnormalities.  No dysuria or unusual/new urinary urgency or frequency.  No recent changes in lower legs. No n/v/d or abd pain.  No palpitations.     Past Medical History:  Diagnosis Date   Anxiety and depression    Ascending aortic aneurysm 01/22/2022   Bilateral shoulder pain, unspecified chronicity    XR 10/2023-->+Bilat RC tendonopathy+AC arth and  mild GH arth on R.  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  DEXA -1.0 04/2023   Orthostatic hypotension    OSA (obstructive sleep apnea)     Osteoarthritis of left hip    + troch burs   Prostate cancer (HCC)    1990-->prostatectomy, released from urol x many years   Urinary incontinence     Past Surgical History:  Procedure Laterality Date   CHOLECYSTECTOMY, LAPAROSCOPIC     1992   DEXA     T score -1.0 Sept 2024   ROBOT ASSISTED LAPAROSCOPIC RADICAL PROSTATECTOMY     1999   ROTATOR CUFF REPAIR     Left 1991, right 1997   TOTAL KNEE ARTHROPLASTY     bilat 2004   TRANSTHORACIC ECHOCARDIOGRAM     11/24/22, EF nl, +LVH with no WMA, grd I DD, valves ok (no amyloid changes)    Outpatient Medications Prior to Visit  Medication Sig Dispense Refill   allopurinol  (ZYLOPRIM ) 300 MG tablet TAKE 1 TABLET BY MOUTH EVERY DAY 90 tablet 0   apixaban (ELIQUIS) 5 MG TABS tablet Take 5 mg by mouth 2 (two) times daily.     aspirin EC 81 MG tablet Take 1 tablet every day by oral route.     atorvastatin  (LIPITOR) 40 MG tablet TAKE 1 TABLET BY MOUTH EVERY DAY 90 tablet 0   Continuous Glucose Receiver (FREESTYLE LIBRE 3 READER) DEVI Inject 1 each into the skin every 14 (fourteen) days. 2 each 2   Continuous Glucose Sensor (FREESTYLE LIBRE 3 PLUS SENSOR) MISC Change sensor every 15 days. 2 each 6   furosemide (LASIX) 40 MG tablet Take 40 mg by mouth daily.     glucose blood test strip E11.9 Use to test blood sugar twice daily or as needed 100 each 12   metoprolol  succinate (TOPROL -XL) 25 MG 24 hr tablet Take 25 mg by mouth daily.     midodrine  (PROAMATINE ) 10 MG tablet TAKE 1 TABLET BEFORE GETTING OUT OF BED OR SOON AFTER, 1 TABLET AT 12PM, 1 TABLET AROUND 4PM 270 tablet 2   Multiple Vitamins-Minerals (ICAPS AREDS 2 PO) TAKE 1 CAPSULE BY MOUTH TWICE A DAY FOR MACULAR DEGENERATION     omeprazole  (PRILOSEC) 20 MG capsule TAKE 1 CAPSULE BY MOUTH EVERY DAY 90 capsule 1   pioglitazone  (ACTOS ) 15 MG tablet TAKE 1 TABLET (15 MG TOTAL) BY MOUTH DAILY. 90 tablet 0   venlafaxine  (EFFEXOR ) 75 MG tablet TAKE 1 TABLET BY MOUTH EVERY DAY 90 tablet 0    cephALEXin (KEFLEX) 500 MG capsule Take 1 capsule (500 mg total) by mouth 3 (three) times daily. 21 capsule 0   metFORMIN  (GLUCOPHAGE ) 500 MG tablet Take 1 tablet (500 mg total) by mouth 2 (two) times daily with a meal.     metoprolol  tartrate (LOPRESSOR ) 25 MG tablet Take 25 mg by mouth daily.     tirzepatide  (MOUNJARO ) 7.5 MG/0.5ML Pen Inject 7.5 mg into the skin once a week. (Patient taking differently: Inject 7.5 mg into the skin once a week.)     furosemide (LASIX) 40 MG tablet Take 40 mg by mouth 2 (  two) times daily. (Patient not taking: Reported on 06/15/2024)     No facility-administered medications prior to visit.    Allergies  Allergen Reactions   Lisinopril Cough, Swelling and Other (See Comments)    Review of Systems As per HPI  PE:    06/21/2024   11:00 AM 06/15/2024   10:39 AM 06/15/2024   10:38 AM  Vitals with BMI  Height 5' 11    Weight 196 lbs 13 oz 187 lbs 187 lbs  BMI 27.46 26.09 26.09  Systolic 106    Diastolic 72    Pulse 73     02 sat 98% RA today  Physical Exam  Gen: Alert, well appearing.  Patient is oriented to person, place, time, and situation. AFFECT: pleasant, lucid thought and speech. Parotid glands were without any asymmetry, nodularity, or tenderness. CV: irreg irreg, rate approx 65-70, no murmur audible, no rub LUNGS: mild dec BS in L base, otherwise normal aeration.  Breathing nonlabored. EXT: no clubbing or cyanosis.  No pitting edema.    LABS:  Last CBC Lab Results  Component Value Date   WBC 7.4 06/12/2024   HGB 13.8 06/12/2024   HCT 43.0 06/12/2024   MCV 92.9 06/12/2024   MCH 29.8 06/12/2024   RDW 13.4 06/12/2024   PLT 255 06/12/2024   Last metabolic panel Lab Results  Component Value Date   GLUCOSE 174 (H) 06/12/2024   NA 137 06/12/2024   K 4.2 06/12/2024   CL 92 (L) 06/12/2024   CO2 28 06/12/2024   BUN 40 (H) 06/12/2024   CREATININE 1.37 (H) 06/12/2024   EGFR 50 (L) 06/12/2024   CALCIUM  9.4 06/12/2024   PROT  6.7 04/17/2024   ALBUMIN 4.5 10/17/2023   BILITOT 0.7 04/17/2024   ALKPHOS 94 10/17/2023   AST 21 04/17/2024   ALT 15 04/17/2024   Last lipids Lab Results  Component Value Date   CHOL 144 04/17/2024   HDL 42 04/17/2024   LDLCALC 81 04/17/2024   TRIG 109 04/17/2024   CHOLHDL 3.4 04/17/2024   Last hemoglobin A1c Lab Results  Component Value Date   HGBA1C 6.1 (A) 04/17/2024   HGBA1C 6.1 04/17/2024   HGBA1C 6.1 04/17/2024   HGBA1C 6.1 04/17/2024   Last thyroid  functions Lab Results  Component Value Date   TSH 2.12 10/17/2023   T4TOTAL 7.1 04/12/2022   Last vitamin D  Lab Results  Component Value Date   VD25OH 76 10/17/2023   Last vitamin B12 and Folate Lab Results  Component Value Date   VITAMINB12 422 04/12/2022   FOLATE 18.5 04/12/2022   IMPRESSION AND PLAN:  #1 new onset atrial fibrillation with rapid ventricular response. Rate is well-controlled on 25 mg Toprol -XL daily. No problems with Eliquis 5 mg twice daily. We have discussed in detail the potential benefits and risks of this medication, particularly in a patient who has a high risk of falls. Cardiology will likely do DCCV after he has been on Eliquis for a full month.  #2  Acute systolic CHF.  EF after discharge from the hospital is down to 30 to 35%. Currently with no signs or symptoms of volume overload, although he has gained some weight back--> 196.8 pounds today compared to 190.4 pounds 9 days ago.   Will continue to hold off on Lasix but we reviewed restarting this if he gains 3 pounds in a 24-hour period or 5 pounds in a week and he will let us  know if he has to do this.  In the meantime, GDMT is the plan.  This is somewhat limited by soft blood pressure and renal insufficiency. He will continue Toprol -XL 25 mg a day.  No ACE/ARB/ARNI at this time but if bp goes up we may start one cautiously.  We'll start jardiance 10mg  today.   3.  Recent acute right parotid gland swelling--> resolved over the last  week with Keflex.   4.  Orthostatic hypotension, history of. He is off midodrine  since the hospitalization.   #5 diabetes without complication, well-controlled. Hemoglobin A1c 6.8% in the hospital.  Hold off on starting Mounjaro  in the setting of his weight loss/acute illness lately.  Decrease metformin  500 mg twice daily and continue Actos  15 mg a day. Plan repeat hemoglobin A1c in 3 months.   #6 chronic renal insufficiency stage IIIa. Serum creatinine 1.37, GFR 50 at last check about a week ago. Electrolytes were normal.  7.  Debilitated patient, unsteady gait, history of recurrent falls. See #1 above regarding discussion about risks of anticoagulation in this setting. He is improving. Home health nursing and PT has been set up.  I personally spent a total of 43 minutes in the care of the patient today including preparing to see the patient, getting/reviewing separately obtained history, performing a medically appropriate exam/evaluation, counseling and educating, placing orders, documenting clinical information in the EHR, communicating results, and coordinating care.  An After Visit Summary was printed and given to the patient.  FOLLOW UP: Return in about 2 weeks (around 07/05/2024) for routine chronic illness f/u.  Signed:  Gerlene Hockey, MD           06/21/2024

## 2024-06-22 ENCOUNTER — Other Ambulatory Visit: Payer: Self-pay

## 2024-06-22 NOTE — Patient Instructions (Signed)
 Visit Information  Thank you for taking time to visit with me today. Please don't hesitate to contact me if I can be of assistance to you before our next scheduled telephone appointment.  Following are the goals we discussed today:   Goals Addressed             This Visit's Progress    [facility] Transitions of Care (TOC) Care Plan       Problems:  Recent Hospitalization for treatment of bilateral pleural effusions 06/15/2024  Patient reports that he is feeling better. Still very weak.  No shortness of breath at this time  06/22/2024 breathing better, denies shortness of breath.  No cough New onset of Afib 06/15/2024  Denies any irregular heart beat at this time.  Taking medications as prescribed. No bleeding. 06/22/2024  denies any epsiodes in the last.  Weight loss of 80 plus pounds- weak 06/15/2024  Weight today of 187.  Additional weight loss since hospital discharge. 06/22/2024  energy improved. No falls using a walker. Weight 192 today. Reports appetite is better today. No diarrhea. Continues to drink ensure. Sleeping well.  Sore in nose and nosebleeds.   Goal:  Over the next 30 days, the patient will not experience hospital readmission  Interventions:  Transitions of Care: Doctor Visits  - discussed the importance of doctor visits Post discharge activity limitations prescribed by provider reviewed- encouraged patient to walk more to but up energy.  Reviewed Signs and symptoms of infection Reviewed all medications and EMR updated. Reviewed post hospital cardiology and PCP visits.  Encouraged daily monitoring of VSS at home and recording.  Scheduled next TOC call Reviewed use of antibiotic ointment in his nose for the sore. Reviewed bleeding control. Encouraged humidifier.  Encouraged patient to call MD for worsening bleeding or any other concerns.  Encoruaged patient to eat and drink well. Reviewed importance of being active in a safe manner. He is currently walking in the home.    AFIB Interventions: Counseled on increased risk of stroke due to Afib and benefits of anticoagulation for stroke prevention Reviewed importance of adherence to anticoagulant exactly as prescribed Counseled on bleeding risk associated with eliquis and importance of self-monitoring for signs/symptoms of bleeding Counseled on importance of regular laboratory monitoring as prescribed Counseled on seeking medical attention after a head injury or if there is blood in the urine/stool Afib action plan reviewed Reviewed no episodes of Afib that patient has notice since hospital discharge.  Reviewed action plan for episodes of Afib and when to call 911.   Patient Self Care Activities:  Attend all scheduled provider appointments Call pharmacy for medication refills 3-7 days in advance of running out of medications Call provider office for new concerns or questions  Notify RN Care Manager of TOC call rescheduling needs Participate in Transition of Care Program/Attend TOC scheduled calls Take medications as prescribed   call office if I gain more than 2 pounds in one day or 5 pounds in one week track weight in diary use salt in moderation watch for swelling in feet, ankles and legs every day weigh myself daily bring diary to all appointments know when to call the doctor:for any changes in condition and weight gain make a plan to eat healthy keep all lab appointments take medicine as prescribed Plan:   Telephone follow up appointment with care management team member scheduled for:  06/28/2024 at 9am The patient has been provided with contact information for the care management team and has been advised  to call with any health related questions or concerns.          Our next appointment is by telephone on 06/28/2024 at 9  Please call the care guide team at [phone] if you need to cancel or reschedule your appointment.   If you are experiencing a Mental Health or Behavioral Health  Crisis or need someone to talk to, please call the Suicide and Crisis Lifeline: 988 call the [place]  National Suicide Prevention Lifeline: (435)536-6159 or TTY: 641-766-8838 TTY 313-244-9051) to talk to a trained counselor call 1-800-273-TALK (toll free, 24 hour hotline) call 911   Care plan and visit instructions communicated with the patient verbally today. Patient agrees to receive a copy in MyChart. Active MyChart status and patient understanding of how to access instructions and care plan via MyChart confirmed with patient.     Alan Ee, RN, BSN, CEN [employer]- Transition of Care Team.  [facility] [phone]

## 2024-06-22 NOTE — Transitions of Care (Post Inpatient/ED Visit) (Signed)
 Transition of Care week 3  Visit Note  06/22/2024  Name: Jason [name] MRN: [id number]          DOB: 26-Jul-1936  Situation: Patient enrolled in [facility] 30-day program. Visit completed with patient by telephone.   Background:   Initial Transition Care Management Follow-up Telephone Call Discharge Date and Diagnosis: 06/07/24, Bilateral pleural effusions   Past Medical History:  Diagnosis Date   Anxiety and depression    Ascending aortic aneurysm 01/22/2022   Bilateral shoulder pain, unspecified chronicity    XR 10/2023-->+Bilat RC tendonopathy+AC arth and mild GH arth on R.  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  DEXA -1.0 04/2023   Orthostatic hypotension    OSA (obstructive sleep apnea)    Osteoarthritis of left hip    + troch burs   Prostate cancer (HCC)    1990-->prostatectomy, released from urol x many years   Urinary incontinence     Assessment: Patient reports that he is slowly feeling better.  Reports home health is active with him. Reports improved appetite. Drinking supplements. No new falls.  Using walker.  Reports monitoring VS daily.    New concern for sore in his nose and some nose bleeds.  Patient Reported Symptoms: Cognitive Cognitive Status: Able to follow simple commands, Alert and oriented to person, place, and time, Normal speech and language skills      Neurological Neurological Review of Symptoms: Weakness Neurological Comment: reports decreasing weakness.  HEENT HEENT Symptoms Reported: Nosebleed HEENT Management Strategies: Medication therapy, Routine screening HEENT Comment: patient reports that he has a sore in his nose and has had  some nose bleeds. Suggested humidifed air in the home,  saline nasal spray, antibiotics ointment on a qtip on the sore in his nose.  If bleeding get worse to call MD.    Cardiovascular Cardiovascular Symptoms Reported: Fatigue Weight: 192 lb (87.1 kg) Cardiovascular Comment: denies any swelling. denies any irregular heart beat.  Respiratory Respiratory Symptoms Reported: No symptoms reported Respiratory Management Strategies: Routine screening  Endocrine Endocrine Symptoms Reported: No symptoms reported Is patient diabetic?: Yes Is patient checking blood sugars at home?: Yes List most recent blood sugar readings, include date and time of day: during call today 93 Endocrine Self-Management Outcome: 4 (good)  Gastrointestinal Gastrointestinal Symptoms Reported: No symptoms reported Other Gastrointestinal Symptoms: denies any more diarrhea      Genitourinary Genitourinary Symptoms Reported: No symptoms reported    Integumentary Integumentary Symptoms Reported: No symptoms reported Skin Management Strategies: Routine screening  Musculoskeletal Musculoskelatal Symptoms Reviewed: Unsteady gait Additional Musculoskeletal Details: continues to use walker. and do PT.  walking in the home. Musculoskeletal Management Strategies: Medical device Musculoskeletal Self-Management Outcome: 5 (very good)      Psychosocial Psychosocial Symptoms Reported: Not assessed         Vitals:   06/22/24 1052  BP: 117/81  Pulse: 90    Medications Reviewed Today     Reviewed by Rumalda Alan PENNER, RN (Registered Nurse) on 06/22/24 at 1046  Med List Status: <None>   Medication Order Taking? Sig Documenting Provider Last Dose Status Informant  allopurinol  (ZYLOPRIM ) 300 MG tablet [id number]  Yes TAKE 1 TABLET BY MOUTH EVERY DAY McGowen, Aleene DEL, MD  Active   apixaban (ELIQUIS) 5 MG TABS tablet [id number] Yes Take 5 mg by mouth 2 (two) times daily. [provider]  Active   aspirin EC 81 MG tablet  [id number] Yes Take 1 tablet every day by oral route. [provider]  Active   atorvastatin  (LIPITOR) 40 MG tablet [id number] Yes TAKE 1 TABLET BY MOUTH EVERY DAY McGowen, Aleene DEL, MD  Active   Continuous Glucose Receiver (FREESTYLE LIBRE 3 READER) [place] [id number] Yes Inject 1 each into the skin every 14 (fourteen) days. McGowen, Aleene DEL, MD  Active   Continuous Glucose Sensor (FREESTYLE LIBRE 3 PLUS SENSOR) [place] [id number] Yes Change sensor every 15 days. McGowen, Philip H, MD  Active   empagliflozin (JARDIANCE) 10 MG TABS tablet [id number] Yes Take 1 tablet (10 mg total) by mouth daily. McGowen, Philip H, MD  Active   furosemide (LASIX) 40 MG tablet [id number]  Take 40 mg by mouth daily.  Patient not taking: Reported on 06/22/2024   [provider]  Active   glucose blood test strip [id number] Yes E11.9 Use to test blood sugar twice daily or as needed Pavero, Christopher, [facility]  Active   metFORMIN  (GLUCOPHAGE ) 500 MG tablet [id number] Yes Take 1 tablet (500 mg total) by mouth 2 (two) times daily with a meal. McGowen, Aleene DEL, MD  Active   metoprolol  succinate (TOPROL -XL) 25 MG 24 hr tablet [id number] Yes Take 25 mg by mouth daily. [provider]  Active   midodrine  (PROAMATINE ) 10 MG tablet [id number]  TAKE 1 TABLET BEFORE GETTING OUT OF BED OR SOON AFTER, 1 TABLET AT 12PM, 1 TABLET AROUND 4PM  Patient not taking: Reported on 06/22/2024   Raford Riggs, MD  Active   Multiple Vitamins-Minerals (ICAPS AREDS 2 PO) [id number] Yes TAKE 1 CAPSULE BY MOUTH TWICE A DAY FOR MACULAR DEGENERATION [provider]  Active   omeprazole  (PRILOSEC) 20 MG capsule [id number] Yes TAKE 1 CAPSULE BY MOUTH EVERY DAY McGowen, Aleene DEL, MD  Active   pioglitazone  (ACTOS ) 15 MG tablet [id number] Yes TAKE 1 TABLET (15 MG TOTAL) BY MOUTH DAILY. McGowen, Philip H, MD  Active   venlafaxine  (EFFEXOR ) 75 MG tablet [id number] Yes TAKE 1 TABLET BY MOUTH EVERY DAY McGowen, Aleene DEL, MD  Active              Goals Addressed             This Visit's Progress    [facility] Transitions of Care (TOC) Care Plan       Problems:  Recent Hospitalization for treatment of bilateral pleural effusions 06/15/2024  Patient reports that he is feeling better. Still very weak.  No shortness of breath at this time  06/22/2024 breathing better, denies shortness of breath.  No cough New onset of Afib 06/15/2024  Denies any irregular heart beat at this time.  Taking medications as prescribed. No bleeding. 06/22/2024  denies any epsiodes in the last.  Weight loss of 80 plus pounds- weak 06/15/2024  Weight today of 187.  Additional weight loss since hospital discharge. 06/22/2024  energy improved. No falls using a walker. Weight 192 today. Reports appetite is better today. No diarrhea. Continues to drink ensure. Sleeping well.  Sore in nose and nosebleeds.   Goal:  Over the next 30 days, the patient will not experience hospital readmission  Interventions:  Transitions of Care: Doctor Visits  - discussed the importance of  doctor visits Post discharge activity limitations prescribed by provider reviewed- encouraged patient to walk more to but up energy.  Reviewed Signs and symptoms of infection Reviewed all medications and EMR updated. Reviewed post hospital cardiology and PCP visits.  Encouraged daily monitoring of VSS at home and recording.  Scheduled next TOC call Reviewed use of antibiotic ointment in his nose for the sore. Reviewed bleeding control. Encouraged humidifier.  Encouraged patient to call MD for worsening bleeding or any other concerns.  Encoruaged patient to eat and drink well. Reviewed importance of being active in a safe manner. He is currently walking in the home.   AFIB Interventions: Counseled on increased risk of stroke due to Afib and benefits of anticoagulation for stroke prevention Reviewed importance of adherence to anticoagulant exactly as prescribed Counseled on bleeding risk  associated with eliquis and importance of self-monitoring for signs/symptoms of bleeding Counseled on importance of regular laboratory monitoring as prescribed Counseled on seeking medical attention after a head injury or if there is blood in the urine/stool Afib action plan reviewed Reviewed no episodes of Afib that patient has notice since hospital discharge.  Reviewed action plan for episodes of Afib and when to call 911.   Patient Self Care Activities:  Attend all scheduled provider appointments Call pharmacy for medication refills 3-7 days in advance of running out of medications Call provider office for new concerns or questions  Notify RN Care Manager of TOC call rescheduling needs Participate in Transition of Care Program/Attend TOC scheduled calls Take medications as prescribed   call office if I gain more than 2 pounds in one day or 5 pounds in one week track weight in diary use salt in moderation watch for swelling in feet, ankles and legs every day weigh myself daily bring diary to all appointments know when to call the doctor:for any changes in condition and weight gain make a plan to eat healthy keep all lab appointments take medicine as prescribed Plan:   Telephone follow up appointment with care management team member scheduled for:  06/28/2024 at 9am The patient has been provided with contact information for the care management team and has been advised to call with any health related questions or concerns.          Recommendation:   Continue Current Plan of Care  Follow Up Plan:   Telephone follow up appointment date/time:  06/28/2024   Alan Ee, RN, BSN, CEN [facility]- Transition of Care Team.  [facility] [phone]

## 2024-06-25 ENCOUNTER — Telehealth: Payer: Self-pay

## 2024-06-25 DIAGNOSIS — J181 Lobar pneumonia, unspecified organism: Secondary | ICD-10-CM | POA: Diagnosis not present

## 2024-06-25 DIAGNOSIS — I4891 Unspecified atrial fibrillation: Secondary | ICD-10-CM | POA: Diagnosis not present

## 2024-06-25 DIAGNOSIS — I5021 Acute systolic (congestive) heart failure: Secondary | ICD-10-CM | POA: Diagnosis not present

## 2024-06-25 DIAGNOSIS — I130 Hypertensive heart and chronic kidney disease with heart failure and stage 1 through stage 4 chronic kidney disease, or unspecified chronic kidney disease: Secondary | ICD-10-CM | POA: Diagnosis not present

## 2024-06-25 DIAGNOSIS — I7121 Aneurysm of the ascending aorta, without rupture: Secondary | ICD-10-CM | POA: Diagnosis not present

## 2024-06-25 DIAGNOSIS — N1831 Chronic kidney disease, stage 3a: Secondary | ICD-10-CM | POA: Diagnosis not present

## 2024-06-25 DIAGNOSIS — I2510 Atherosclerotic heart disease of native coronary artery without angina pectoris: Secondary | ICD-10-CM | POA: Diagnosis not present

## 2024-06-25 DIAGNOSIS — E1122 Type 2 diabetes mellitus with diabetic chronic kidney disease: Secondary | ICD-10-CM | POA: Diagnosis not present

## 2024-06-25 DIAGNOSIS — M1030 Gout due to renal impairment, unspecified site: Secondary | ICD-10-CM | POA: Diagnosis not present

## 2024-06-25 DIAGNOSIS — Z9181 History of falling: Secondary | ICD-10-CM | POA: Diagnosis not present

## 2024-06-25 DIAGNOSIS — I951 Orthostatic hypotension: Secondary | ICD-10-CM | POA: Diagnosis not present

## 2024-06-25 DIAGNOSIS — I712 Thoracic aortic aneurysm, without rupture, unspecified: Secondary | ICD-10-CM | POA: Diagnosis not present

## 2024-06-25 NOTE — Telephone Encounter (Signed)
 Home health orders received 06/25/24 for [facility] health initiation orders: Yes.  Home health re-certification orders: No. Patient last seen by ordering physician for this condition: 06/21/24. Must be less than 90 days for re-certification and less than 30 days prior for initiation. Visit must have been for the condition the orders are being placed.  Patient meets criteria for Physician to sign orders: Yes.        Current med list has been attached: Yes        Orders placed on physicians desk for signature: 06/25/24 (date) If patient does not meet criteria for orders to be signed: pt was called to schedule appt. Appt is scheduled for 07/05/24.    Placed on PCP desk to review and sign, if appropriate.   Sokha Craker D Wille Aubuchon

## 2024-06-25 NOTE — Telephone Encounter (Signed)
 Received fax from [facility] Certification & Plan of Care 06/19/24 to 08/17/24   Order # [id number]   McGowen inbox front office

## 2024-06-26 NOTE — Telephone Encounter (Signed)
 Signed and put in box to go up front. Signed:  Gerlene Hockey, MD           06/26/2024

## 2024-06-27 DIAGNOSIS — Z7982 Long term (current) use of aspirin: Secondary | ICD-10-CM | POA: Diagnosis not present

## 2024-06-27 DIAGNOSIS — M1030 Gout due to renal impairment, unspecified site: Secondary | ICD-10-CM | POA: Diagnosis not present

## 2024-06-27 DIAGNOSIS — K219 Gastro-esophageal reflux disease without esophagitis: Secondary | ICD-10-CM | POA: Diagnosis not present

## 2024-06-27 DIAGNOSIS — M1612 Unilateral primary osteoarthritis, left hip: Secondary | ICD-10-CM | POA: Diagnosis not present

## 2024-06-27 DIAGNOSIS — N1831 Chronic kidney disease, stage 3a: Secondary | ICD-10-CM | POA: Diagnosis not present

## 2024-06-27 DIAGNOSIS — F411 Generalized anxiety disorder: Secondary | ICD-10-CM | POA: Diagnosis not present

## 2024-06-27 DIAGNOSIS — E876 Hypokalemia: Secondary | ICD-10-CM | POA: Diagnosis not present

## 2024-06-27 DIAGNOSIS — G4733 Obstructive sleep apnea (adult) (pediatric): Secondary | ICD-10-CM | POA: Diagnosis not present

## 2024-06-27 DIAGNOSIS — Z6826 Body mass index (BMI) 26.0-26.9, adult: Secondary | ICD-10-CM | POA: Diagnosis not present

## 2024-06-27 DIAGNOSIS — E663 Overweight: Secondary | ICD-10-CM | POA: Diagnosis not present

## 2024-06-27 DIAGNOSIS — I5021 Acute systolic (congestive) heart failure: Secondary | ICD-10-CM | POA: Diagnosis not present

## 2024-06-27 DIAGNOSIS — K7460 Unspecified cirrhosis of liver: Secondary | ICD-10-CM | POA: Diagnosis not present

## 2024-06-27 DIAGNOSIS — F32A Depression, unspecified: Secondary | ICD-10-CM | POA: Diagnosis not present

## 2024-06-27 DIAGNOSIS — I130 Hypertensive heart and chronic kidney disease with heart failure and stage 1 through stage 4 chronic kidney disease, or unspecified chronic kidney disease: Secondary | ICD-10-CM | POA: Diagnosis not present

## 2024-06-27 DIAGNOSIS — I4891 Unspecified atrial fibrillation: Secondary | ICD-10-CM | POA: Diagnosis not present

## 2024-06-27 DIAGNOSIS — Z7901 Long term (current) use of anticoagulants: Secondary | ICD-10-CM | POA: Diagnosis not present

## 2024-06-27 DIAGNOSIS — I081 Rheumatic disorders of both mitral and tricuspid valves: Secondary | ICD-10-CM | POA: Diagnosis not present

## 2024-06-27 DIAGNOSIS — Z7984 Long term (current) use of oral hypoglycemic drugs: Secondary | ICD-10-CM | POA: Diagnosis not present

## 2024-06-27 DIAGNOSIS — E1122 Type 2 diabetes mellitus with diabetic chronic kidney disease: Secondary | ICD-10-CM | POA: Diagnosis not present

## 2024-06-27 DIAGNOSIS — I7121 Aneurysm of the ascending aorta, without rupture: Secondary | ICD-10-CM | POA: Diagnosis not present

## 2024-06-27 DIAGNOSIS — I951 Orthostatic hypotension: Secondary | ICD-10-CM | POA: Diagnosis not present

## 2024-06-27 DIAGNOSIS — I712 Thoracic aortic aneurysm, without rupture, unspecified: Secondary | ICD-10-CM | POA: Diagnosis not present

## 2024-06-27 DIAGNOSIS — J181 Lobar pneumonia, unspecified organism: Secondary | ICD-10-CM | POA: Diagnosis not present

## 2024-06-27 DIAGNOSIS — E7800 Pure hypercholesterolemia, unspecified: Secondary | ICD-10-CM | POA: Diagnosis not present

## 2024-06-27 DIAGNOSIS — I2510 Atherosclerotic heart disease of native coronary artery without angina pectoris: Secondary | ICD-10-CM | POA: Diagnosis not present

## 2024-06-28 ENCOUNTER — Other Ambulatory Visit: Payer: Self-pay

## 2024-06-28 NOTE — Patient Instructions (Signed)
 Visit Information  Thank you for taking time to visit with me today. Please don't hesitate to contact me if I can be of assistance to you before our next scheduled telephone appointment.  Following are the goals we discussed today:   Goals Addressed             This Visit's Progress    [facility] Transitions of Care (TOC) Care Plan       Problems:  Recent Hospitalization for treatment of bilateral pleural effusions 06/15/2024  Patient reports that he is feeling better. Still very weak.  No shortness of breath at this time  06/22/2024 breathing better, denies shortness of breath.  No cough.  06/28/2024  Patient reports that his breathing is much improved. Reports decrease in weakness, improved energy. Eating well. New onset of Afib 06/15/2024  Denies any irregular heart beat at this time.  Taking medications as prescribed. No bleeding. 06/22/2024  denies any epsiodes in the last. 06/28/2024  Denies any noticeable palpations.  BP stable at home per patient.  Reports decrease in nose bleeds.  Weight loss of 80 plus pounds- weak 06/15/2024  Weight today of 187.  Additional weight loss since hospital discharge. 06/22/2024  energy improved. No falls using a walker. Weight 192 today. Reports appetite is better today. No diarrhea. Continues to drink ensure. Sleeping well. 06/28/2024  Reports appetite is better and he is eating well.  Feeling better overall. Active with home health nursing and PT will start next week.  Sore in nose and nosebleeds. 06/28/2024  reports decrease in nose bleeds. Continues to have a sore in his nose but overall improved.  Denies any new concerns today.   Goal:  Over the next 30 days, the patient will not experience hospital readmission  Interventions:  Transitions of Care: Doctor Visits  - discussed the importance of doctor visits- pending follow up with PCP in 1 week.  Encouraged daily monitoring of VSS at home and recording.  Scheduled next TOC call Reviewed use of antibiotic  ointment in his nose for the sore. Reviewed bleeding control. Encouraged humidifier. Encouraged patient to continue this for his nose Encouraged patient to call MD for worsening bleeding or any other concerns.  Encoruaged patient to eat and drink well. Reviewed improved appetite.  Reviewed importance of being active in a safe manner. He is currently walking in the home.  PT to start next week.    AFIB Interventions: Counseled on increased risk of stroke due to Afib and benefits of anticoagulation for stroke prevention Reviewed importance of adherence to anticoagulant exactly as prescribed Counseled on bleeding risk associated with eliquis and importance of self-monitoring for signs/symptoms of bleeding Counseled on importance of regular laboratory monitoring as prescribed Counseled on seeking medical attention after a head injury or if there is blood in the urine/stool Afib action plan reviewed Reviewed no episodes of Afib that patient has notice since hospital discharge.  Reviewed action plan for episodes of Afib and when to call 911.   Patient Self Care Activities:  Attend all scheduled provider appointments Call pharmacy for medication refills 3-7 days in advance of running out of medications Call provider office for new concerns or questions  Notify RN Care Manager of TOC call rescheduling needs Participate in Transition of Care Program/Attend TOC scheduled calls Take medications as prescribed   call office if I gain more than 2 pounds in one day or 5 pounds in one week track weight in diary use salt in moderation watch for swelling  in feet, ankles and legs every day weigh myself daily bring diary to all appointments know when to call the doctor:for any changes in condition and weight gain make a plan to eat healthy keep all lab appointments take medicine as prescribed Keep up the great work.  Continue to be active in a safe manner with walker.  Plan:   Telephone follow up  appointment with care management team member scheduled for:  07/05/2024 at noon The patient has been provided with contact information for the care management team and has been advised to call with any health related questions or concerns.          Our next appointment is by telephone on 07/05/2024 at noon  Please call the care guide team at [phone] if you need to cancel or reschedule your appointment.   If you are experiencing a Mental Health or Behavioral Health Crisis or need someone to talk to, please call the Suicide and Crisis Lifeline: 988 call the [place]  National Suicide Prevention Lifeline: 864-169-9537 or TTY: 737-200-5585 TTY 314-485-9333) to talk to a trained counselor call 1-800-273-TALK (toll free, 24 hour hotline) call 911   Care plan and visit instructions communicated with the patient verbally today. Patient agrees to receive a copy in MyChart. Active MyChart status and patient understanding of how to access instructions and care plan via MyChart confirmed with patient.     Alan Ee, RN, BSN, CEN [employer]- Transition of Care Team.  [facility] [phone]

## 2024-06-28 NOTE — Transitions of Care (Post Inpatient/ED Visit) (Signed)
 Transition of Care week 4  Visit Note  06/28/2024  Name: Jason [name] MRN: [id number]          DOB: [birth year]  Situation: Patient enrolled in [facility] 30-day program. Visit completed with patient and wife by telephone.   Background:   Initial Transition Care Management Follow-up Telephone Call Discharge Date and Diagnosis: 06/07/24, Bilateral pleural effusions   Past Medical History:  Diagnosis Date   Anxiety and depression    Ascending aortic aneurysm 01/22/2022   Bilateral shoulder pain, unspecified chronicity    XR 10/2023-->+Bilat RC tendonopathy+AC arth and mild GH arth on R.  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  DEXA -1.0 04/2023   Orthostatic hypotension    OSA (obstructive sleep apnea)    Osteoarthritis of left hip    + troch burs   Prostate cancer (HCC)    1990-->prostatectomy, released from urol x many years   Urinary incontinence     Assessment: Patient reports that he is doing well and feeling stronger. No new concerns today. Nosebleeds have decreased. PT to start next week.  Patient Reported Symptoms: Cognitive Cognitive Status: Able to follow simple commands, Alert and oriented to person, place, and time, Normal speech and language skills      Neurological Neurological Review of Symptoms: Weakness Neurological Management Strategies: Routine screening Neurological Self-Management Outcome: 4 (good) Neurological Comment: Feels a little stronger.  HEENT HEENT Symptoms Reported: Other: HEENT Management Strategies: Routine screening HEENT Comment: nosebleeding has decreased.  sore in nose still present but getting better.    Cardiovascular  Cardiovascular Symptoms Reported: No symptoms reported Does patient have uncontrolled Hypertension?: No Cardiovascular Management Strategies: Medication therapy Weight: 195 lb 3.2 oz (88.5 kg) Cardiovascular Self-Management Outcome: 5 (very good) Cardiovascular Comment: denies swelling today  Respiratory Respiratory Symptoms Reported: No symptoms reported Respiratory Self-Management Outcome: 5 (very good)  Endocrine Endocrine Symptoms Reported: No symptoms reported Is patient diabetic?: Yes Is patient checking blood sugars at home?: Yes List most recent blood sugar readings, include date and time of day: today 107. Endocrine Self-Management Outcome: 5 (very good) Endocrine Comment: denies any low readings  Gastrointestinal Gastrointestinal Symptoms Reported: No symptoms reported Other Gastrointestinal Symptoms: LBM- yesterday Gastrointestinal Self-Management Outcome: 5 (very good)    Genitourinary Genitourinary Symptoms Reported: No symptoms reported    Integumentary Integumentary Symptoms Reported: Bruising    Musculoskeletal Musculoskelatal Symptoms Reviewed: Unsteady gait Additional Musculoskeletal Details: using walker. Waiting for PT to start. No falls in the last week.        Psychosocial Psychosocial Symptoms Reported: Not assessed         Today's Vitals   06/28/24 0924 06/28/24 0926 06/28/24 0927  BP:   127/83  Weight: 195 lb 3.2 oz (88.5 kg)  195 lb 3.2 oz (88.5 kg)  PainSc:  0-No pain      Medications Reviewed Today     Reviewed by Rumalda Alan PENNER, RN (Registered Nurse) on 06/28/24 at [phone]  Med List Status: <None>   Medication Order Taking? Sig Documenting Provider Last Dose Status Informant  allopurinol  (ZYLOPRIM ) 300 MG tablet [id number] Yes TAKE 1 TABLET BY MOUTH EVERY DAY McGowen,  Aleene DEL, MD  Active   apixaban (ELIQUIS) 5 MG TABS tablet [id number] Yes Take 5 mg by mouth 2 (two) times daily. [provider]  Active   aspirin EC 81 MG tablet  [id number] Yes Take 1 tablet every day by oral route. [provider]  Active   atorvastatin  (LIPITOR) 40 MG tablet [id number] Yes TAKE 1 TABLET BY MOUTH EVERY DAY McGowen, Aleene DEL, MD  Active   Continuous Glucose Receiver (FREESTYLE LIBRE 3 READER) DEVI [id number] Yes Inject 1 each into the skin every 14 (fourteen) days. McGowen, Aleene DEL, MD  Active   Continuous Glucose Sensor (FREESTYLE LIBRE 3 PLUS SENSOR) [place] [id number] Yes Change sensor every 15 days. McGowen, Philip H, MD  Active   empagliflozin (JARDIANCE) 10 MG TABS tablet [id number] Yes Take 1 tablet (10 mg total) by mouth daily. McGowen, Philip H, MD  Active   furosemide (LASIX) 40 MG tablet [id number]  Take 40 mg by mouth daily.  Patient not taking: Reported on 06/28/2024   [provider]  Active   glucose blood test strip [id number] Yes E11.9 Use to test blood sugar twice daily or as needed Pavero, Christopher, [facility]  Active   metFORMIN  (GLUCOPHAGE ) 500 MG tablet [id number] Yes Take 1 tablet (500 mg total) by mouth 2 (two) times daily with a meal. McGowen, Aleene DEL, MD  Active   metoprolol  succinate (TOPROL -XL) 25 MG 24 hr tablet [id number] Yes Take 25 mg by mouth daily. [provider]  Active   midodrine  (PROAMATINE ) 10 MG tablet [id number] Yes TAKE 1 TABLET BEFORE GETTING OUT OF BED OR SOON AFTER, 1 TABLET AT 12PM, 1 TABLET AROUND 4PM Raford Riggs, MD  Active   Multiple Vitamins-Minerals (ICAPS AREDS 2 PO) [id number] Yes TAKE 1 CAPSULE BY MOUTH TWICE A DAY FOR MACULAR DEGENERATION [provider]  Active   omeprazole  (PRILOSEC) 20 MG capsule [id number] Yes TAKE 1 CAPSULE BY MOUTH EVERY DAY McGowen, Aleene DEL, MD  Active   pioglitazone  (ACTOS ) 15 MG tablet [id number] Yes TAKE 1 TABLET (15 MG TOTAL) BY MOUTH DAILY. McGowen, Philip H, MD  Active   venlafaxine  (EFFEXOR ) 75 MG tablet [id number] Yes TAKE 1 TABLET BY MOUTH EVERY DAY McGowen, Aleene DEL, MD  Active             Goals Addressed              This Visit's Progress    [facility] Transitions of Care (TOC) Care Plan       Problems:  Recent Hospitalization for treatment of bilateral pleural effusions 06/15/2024  Patient reports that he is feeling better. Still very weak.  No shortness of breath at this time  06/22/2024 breathing better, denies shortness of breath.  No cough.  06/28/2024  Patient reports that his breathing is much improved. Reports decrease in weakness, improved energy. Eating well. New onset of Afib 06/15/2024  Denies any irregular heart beat at this time.  Taking medications as prescribed. No bleeding. 06/22/2024  denies any epsiodes in the last. 06/28/2024  Denies any noticeable palpations.  BP stable at home per patient.  Reports decrease in nose bleeds.  Weight loss of 80 plus pounds- weak 06/15/2024  Weight today of 187.  Additional weight loss since hospital discharge. 06/22/2024  energy improved. No falls using a walker. Weight 192 today. Reports appetite is better today. No diarrhea. Continues to drink ensure. Sleeping well. 06/28/2024  Reports appetite is better and he is eating well.  Feeling better overall. Active  with home health nursing and PT will start next week.  Sore in nose and nosebleeds. 06/28/2024  reports decrease in nose bleeds. Continues to have a sore in his nose but overall improved.  Denies any new concerns today.   Goal:  Over the next 30 days, the patient will not experience hospital readmission  Interventions:  Transitions of Care: Doctor Visits  - discussed the importance of doctor visits- pending follow up with PCP in 1 week.  Encouraged daily monitoring of VSS at home and recording.  Scheduled next TOC call Reviewed use of antibiotic ointment in his nose for the sore. Reviewed bleeding control. Encouraged humidifier. Encouraged patient to continue this for his nose Encouraged patient to call MD for worsening bleeding or any other concerns.  Encoruaged patient to eat and drink well. Reviewed improved  appetite.  Reviewed importance of being active in a safe manner. He is currently walking in the home.  PT to start next week.    AFIB Interventions: Counseled on increased risk of stroke due to Afib and benefits of anticoagulation for stroke prevention Reviewed importance of adherence to anticoagulant exactly as prescribed Counseled on bleeding risk associated with eliquis and importance of self-monitoring for signs/symptoms of bleeding Counseled on importance of regular laboratory monitoring as prescribed Counseled on seeking medical attention after a head injury or if there is blood in the urine/stool Afib action plan reviewed Reviewed no episodes of Afib that patient has notice since hospital discharge.  Reviewed action plan for episodes of Afib and when to call 911.   Patient Self Care Activities:  Attend all scheduled provider appointments Call pharmacy for medication refills 3-7 days in advance of running out of medications Call provider office for new concerns or questions  Notify RN Care Manager of TOC call rescheduling needs Participate in Transition of Care Program/Attend TOC scheduled calls Take medications as prescribed   call office if I gain more than 2 pounds in one day or 5 pounds in one week track weight in diary use salt in moderation watch for swelling in feet, ankles and legs every day weigh myself daily bring diary to all appointments know when to call the doctor:for any changes in condition and weight gain make a plan to eat healthy keep all lab appointments take medicine as prescribed Keep up the great work.  Continue to be active in a safe manner with walker.  Plan:   Telephone follow up appointment with care management team member scheduled for:  07/05/2024 at noon The patient has been provided with contact information for the care management team and has been advised to call with any health related questions or concerns.         Recommendation:    Continue Current Plan of Care  Follow Up Plan:   Telephone follow up appointment date/time:  07/05/2024 at noon  Alan Ee, RN, BSN, [employer]- Transition of Care Team.  [facility] [phone]

## 2024-07-02 ENCOUNTER — Ambulatory Visit: Admitting: Podiatry

## 2024-07-03 DIAGNOSIS — Z7982 Long term (current) use of aspirin: Secondary | ICD-10-CM | POA: Diagnosis not present

## 2024-07-03 DIAGNOSIS — Z7984 Long term (current) use of oral hypoglycemic drugs: Secondary | ICD-10-CM | POA: Diagnosis not present

## 2024-07-03 DIAGNOSIS — I2510 Atherosclerotic heart disease of native coronary artery without angina pectoris: Secondary | ICD-10-CM | POA: Diagnosis not present

## 2024-07-03 DIAGNOSIS — K7460 Unspecified cirrhosis of liver: Secondary | ICD-10-CM | POA: Diagnosis not present

## 2024-07-03 DIAGNOSIS — E7800 Pure hypercholesterolemia, unspecified: Secondary | ICD-10-CM | POA: Diagnosis not present

## 2024-07-03 DIAGNOSIS — F411 Generalized anxiety disorder: Secondary | ICD-10-CM | POA: Diagnosis not present

## 2024-07-03 DIAGNOSIS — Z6826 Body mass index (BMI) 26.0-26.9, adult: Secondary | ICD-10-CM | POA: Diagnosis not present

## 2024-07-03 DIAGNOSIS — I5021 Acute systolic (congestive) heart failure: Secondary | ICD-10-CM | POA: Diagnosis not present

## 2024-07-03 DIAGNOSIS — M1612 Unilateral primary osteoarthritis, left hip: Secondary | ICD-10-CM | POA: Diagnosis not present

## 2024-07-03 DIAGNOSIS — N1831 Chronic kidney disease, stage 3a: Secondary | ICD-10-CM | POA: Diagnosis not present

## 2024-07-03 DIAGNOSIS — I951 Orthostatic hypotension: Secondary | ICD-10-CM | POA: Diagnosis not present

## 2024-07-03 DIAGNOSIS — J181 Lobar pneumonia, unspecified organism: Secondary | ICD-10-CM | POA: Diagnosis not present

## 2024-07-03 DIAGNOSIS — K219 Gastro-esophageal reflux disease without esophagitis: Secondary | ICD-10-CM | POA: Diagnosis not present

## 2024-07-03 DIAGNOSIS — I4891 Unspecified atrial fibrillation: Secondary | ICD-10-CM | POA: Diagnosis not present

## 2024-07-03 DIAGNOSIS — E1122 Type 2 diabetes mellitus with diabetic chronic kidney disease: Secondary | ICD-10-CM | POA: Diagnosis not present

## 2024-07-03 DIAGNOSIS — I130 Hypertensive heart and chronic kidney disease with heart failure and stage 1 through stage 4 chronic kidney disease, or unspecified chronic kidney disease: Secondary | ICD-10-CM | POA: Diagnosis not present

## 2024-07-03 DIAGNOSIS — F32A Depression, unspecified: Secondary | ICD-10-CM | POA: Diagnosis not present

## 2024-07-03 DIAGNOSIS — Z7901 Long term (current) use of anticoagulants: Secondary | ICD-10-CM | POA: Diagnosis not present

## 2024-07-03 DIAGNOSIS — M1030 Gout due to renal impairment, unspecified site: Secondary | ICD-10-CM | POA: Diagnosis not present

## 2024-07-03 DIAGNOSIS — E663 Overweight: Secondary | ICD-10-CM | POA: Diagnosis not present

## 2024-07-03 DIAGNOSIS — E876 Hypokalemia: Secondary | ICD-10-CM | POA: Diagnosis not present

## 2024-07-03 DIAGNOSIS — I7121 Aneurysm of the ascending aorta, without rupture: Secondary | ICD-10-CM | POA: Diagnosis not present

## 2024-07-03 DIAGNOSIS — I081 Rheumatic disorders of both mitral and tricuspid valves: Secondary | ICD-10-CM | POA: Diagnosis not present

## 2024-07-03 DIAGNOSIS — G4733 Obstructive sleep apnea (adult) (pediatric): Secondary | ICD-10-CM | POA: Diagnosis not present

## 2024-07-03 DIAGNOSIS — I712 Thoracic aortic aneurysm, without rupture, unspecified: Secondary | ICD-10-CM | POA: Diagnosis not present

## 2024-07-04 ENCOUNTER — Telehealth: Payer: Self-pay

## 2024-07-04 DIAGNOSIS — F411 Generalized anxiety disorder: Secondary | ICD-10-CM | POA: Diagnosis not present

## 2024-07-04 DIAGNOSIS — E876 Hypokalemia: Secondary | ICD-10-CM | POA: Diagnosis not present

## 2024-07-04 DIAGNOSIS — Z7984 Long term (current) use of oral hypoglycemic drugs: Secondary | ICD-10-CM | POA: Diagnosis not present

## 2024-07-04 DIAGNOSIS — J181 Lobar pneumonia, unspecified organism: Secondary | ICD-10-CM | POA: Diagnosis not present

## 2024-07-04 DIAGNOSIS — I4891 Unspecified atrial fibrillation: Secondary | ICD-10-CM | POA: Diagnosis not present

## 2024-07-04 DIAGNOSIS — K219 Gastro-esophageal reflux disease without esophagitis: Secondary | ICD-10-CM | POA: Diagnosis not present

## 2024-07-04 DIAGNOSIS — Z7901 Long term (current) use of anticoagulants: Secondary | ICD-10-CM | POA: Diagnosis not present

## 2024-07-04 DIAGNOSIS — F32A Depression, unspecified: Secondary | ICD-10-CM | POA: Diagnosis not present

## 2024-07-04 DIAGNOSIS — I2510 Atherosclerotic heart disease of native coronary artery without angina pectoris: Secondary | ICD-10-CM | POA: Diagnosis not present

## 2024-07-04 DIAGNOSIS — G4733 Obstructive sleep apnea (adult) (pediatric): Secondary | ICD-10-CM | POA: Diagnosis not present

## 2024-07-04 DIAGNOSIS — E663 Overweight: Secondary | ICD-10-CM | POA: Diagnosis not present

## 2024-07-04 DIAGNOSIS — I5021 Acute systolic (congestive) heart failure: Secondary | ICD-10-CM | POA: Diagnosis not present

## 2024-07-04 DIAGNOSIS — I081 Rheumatic disorders of both mitral and tricuspid valves: Secondary | ICD-10-CM | POA: Diagnosis not present

## 2024-07-04 DIAGNOSIS — E1122 Type 2 diabetes mellitus with diabetic chronic kidney disease: Secondary | ICD-10-CM | POA: Diagnosis not present

## 2024-07-04 DIAGNOSIS — I7121 Aneurysm of the ascending aorta, without rupture: Secondary | ICD-10-CM | POA: Diagnosis not present

## 2024-07-04 DIAGNOSIS — I712 Thoracic aortic aneurysm, without rupture, unspecified: Secondary | ICD-10-CM | POA: Diagnosis not present

## 2024-07-04 DIAGNOSIS — M1612 Unilateral primary osteoarthritis, left hip: Secondary | ICD-10-CM | POA: Diagnosis not present

## 2024-07-04 DIAGNOSIS — E7800 Pure hypercholesterolemia, unspecified: Secondary | ICD-10-CM | POA: Diagnosis not present

## 2024-07-04 DIAGNOSIS — N1831 Chronic kidney disease, stage 3a: Secondary | ICD-10-CM | POA: Diagnosis not present

## 2024-07-04 DIAGNOSIS — K7460 Unspecified cirrhosis of liver: Secondary | ICD-10-CM | POA: Diagnosis not present

## 2024-07-04 DIAGNOSIS — Z6826 Body mass index (BMI) 26.0-26.9, adult: Secondary | ICD-10-CM | POA: Diagnosis not present

## 2024-07-04 DIAGNOSIS — Z7982 Long term (current) use of aspirin: Secondary | ICD-10-CM | POA: Diagnosis not present

## 2024-07-04 DIAGNOSIS — M1030 Gout due to renal impairment, unspecified site: Secondary | ICD-10-CM | POA: Diagnosis not present

## 2024-07-04 DIAGNOSIS — I951 Orthostatic hypotension: Secondary | ICD-10-CM | POA: Diagnosis not present

## 2024-07-04 DIAGNOSIS — I130 Hypertensive heart and chronic kidney disease with heart failure and stage 1 through stage 4 chronic kidney disease, or unspecified chronic kidney disease: Secondary | ICD-10-CM | POA: Diagnosis not present

## 2024-07-04 NOTE — Telephone Encounter (Signed)
 Received fax from [facility]   Order # [id number]   McGowen inbox front office

## 2024-07-04 NOTE — Telephone Encounter (Signed)
 Home health orders received 07/04/24 for [name] health initiation orders: Yes.  Home health re-certification orders: No. Patient last seen by ordering physician for this condition: 06/21/24. Must be less than 90 days for re-certification and less than 30 days prior for initiation. Visit must have been for the condition the orders are being placed.  Patient meets criteria for Physician to sign orders: Yes.        Current med list has been attached: No        Orders placed on physicians desk for signature: 07/04/24 (date) If patient does not meet criteria for orders to be signed: pt was called to schedule appt. Appt is scheduled for 07/05/24.   Placed on PCP desk to review and sign, if appropriate.   Jason [name] Jason [name]

## 2024-07-04 NOTE — Telephone Encounter (Signed)
 Signed and put in box to go up front. Signed:  Gerlene Hockey, MD           07/04/2024

## 2024-07-05 ENCOUNTER — Other Ambulatory Visit: Payer: Self-pay

## 2024-07-05 ENCOUNTER — Ambulatory Visit (INDEPENDENT_AMBULATORY_CARE_PROVIDER_SITE_OTHER): Admitting: Family Medicine

## 2024-07-05 ENCOUNTER — Other Ambulatory Visit: Payer: Self-pay | Admitting: Family Medicine

## 2024-07-05 ENCOUNTER — Encounter: Payer: Self-pay | Admitting: Family Medicine

## 2024-07-05 VITALS — BP 106/70 | HR 75 | Temp 98.2°F | Wt 199.4 lb

## 2024-07-05 VITALS — BP 134/86 | HR 89 | Wt 195.0 lb

## 2024-07-05 DIAGNOSIS — Z7901 Long term (current) use of anticoagulants: Secondary | ICD-10-CM | POA: Diagnosis not present

## 2024-07-05 DIAGNOSIS — J340 Abscess, furuncle and carbuncle of nose: Secondary | ICD-10-CM

## 2024-07-05 DIAGNOSIS — R5381 Other malaise: Secondary | ICD-10-CM | POA: Diagnosis not present

## 2024-07-05 DIAGNOSIS — I4891 Unspecified atrial fibrillation: Secondary | ICD-10-CM

## 2024-07-05 DIAGNOSIS — E1169 Type 2 diabetes mellitus with other specified complication: Secondary | ICD-10-CM

## 2024-07-05 DIAGNOSIS — I5022 Chronic systolic (congestive) heart failure: Secondary | ICD-10-CM

## 2024-07-05 MED ORDER — MUPIROCIN 2 % EX OINT: 1.0000 | TOPICAL_OINTMENT | Freq: Three times a day (TID) | CUTANEOUS | 0 refills | Status: AC

## 2024-07-05 MED ORDER — METFORMIN HCL 500 MG PO TABS: 500.0000 mg | ORAL_TABLET | Freq: Two times a day (BID) | ORAL | 1 refills | Status: AC

## 2024-07-05 NOTE — Transitions of Care (Post Inpatient/ED Visit) (Signed)
 Transition of Care week 5  Visit Note  07/05/2024  Name: Jason [name] MRN: [id number]          DOB: May 21, 1936  Situation: Patient enrolled in [facility] 30-day program. Visit completed with patient by telephone.   Background:   Initial Transition Care Management Follow-up Telephone Call Discharge Date and Diagnosis: 06/07/24, Bilateral pleural effusions   Past Medical History:  Diagnosis Date   Anxiety and depression    Ascending aortic aneurysm 01/22/2022   Bilateral shoulder pain, unspecified chronicity    XR 10/2023-->+Bilat RC tendonopathy+AC arth and mild GH arth on R.  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  DEXA -1.0 04/2023   Orthostatic hypotension    OSA (obstructive sleep apnea)    Osteoarthritis of left hip    + troch burs   Prostate cancer (HCC)    1990-->prostatectomy, released from urol x many years   Urinary incontinence     Assessment: Patient reports that he is improving and denies any new concerns.  PT to start after Thanksgiving.  Patient Reported Symptoms: Cognitive Cognitive Status: Able to follow simple commands, Alert and oriented to person, place, and time, Normal speech and language skills      Neurological Neurological Review of Symptoms: Weakness Neurological Comment: using walker  HEENT HEENT Symptoms Reported: Other: HEENT Comment: reports sore in nose is healing and no longer having nosebleeds.    Cardiovascular Cardiovascular Symptoms Reported: No symptoms reported Weight: 195 lb (88.5 kg)  Respiratory Respiratory Symptoms Reported: No symptoms reported Other Respiratory Symptoms: patient reports breathing is good.   sound  stronger on the phone.    Endocrine Endocrine Symptoms Reported: No symptoms reported Is patient diabetic?: Yes Is patient checking blood sugars at home?: Yes List most recent blood sugar readings, include date and time of day: 100.    Gastrointestinal Gastrointestinal Symptoms Reported: No symptoms reported Other Gastrointestinal Symptoms: LBM yesterday- normal      Genitourinary Genitourinary Symptoms Reported: No symptoms reported    Integumentary Integumentary Symptoms Reported: No symptoms reported    Musculoskeletal Musculoskelatal Symptoms Reviewed: Unsteady gait Additional Musculoskeletal Details: continues to use walker. no falls.  pending PT starting after Thanksgiving. Musculoskeletal Management Strategies: Medical device Musculoskeletal Self-Management Outcome: 4 (good)      Psychosocial Psychosocial Symptoms Reported: No symptoms reported         Today's Vitals   07/05/24 1126  BP: 134/86  Pulse: 89  Weight: 195 lb (88.5 kg)   Pain Scale: 0-10 Pain Score: 0-No pain  Medications Reviewed Today     Reviewed by Rumalda Alan PENNER, RN (Registered Nurse) on 07/05/24 at 1134  Med List Status: <None>   Medication Order Taking? Sig Documenting Provider Last Dose Status Informant  allopurinol  (ZYLOPRIM ) 300 MG tablet [id number] Yes TAKE 1 TABLET BY MOUTH EVERY DAY McGowen, Aleene VEAR, MD  Active   apixaban (ELIQUIS) 5 MG TABS tablet [id number] Yes Take 5 mg by mouth 2 (two) times daily. [provider]  Active   aspirin EC 81 MG tablet [id number] Yes Take 1 tablet every day by oral route. [provider]  Active   atorvastatin  (  LIPITOR) 40 MG tablet [id number] Yes TAKE 1 TABLET BY MOUTH EVERY DAY McGowen, Aleene DEL, MD  Active   Continuous Glucose Receiver (FREESTYLE LIBRE 3 READER) DEVI [id number] Yes Inject 1 each into the skin every 14 (fourteen) days. McGowen, Aleene DEL, MD  Active   Continuous Glucose Sensor (FREESTYLE LIBRE 3 PLUS SENSOR) [place] [id number] Yes  Change sensor every 15 days. McGowen, Philip H, MD  Active   empagliflozin (JARDIANCE) 10 MG TABS tablet [id number] Yes Take 1 tablet (10 mg total) by mouth daily. McGowen, Philip H, MD  Active   furosemide (LASIX) 40 MG tablet [id number] Yes Take 40 mg by mouth daily. [provider]  Active   glucose blood test strip [id number] Yes E11.9 Use to test blood sugar twice daily or as needed Pavero, Christopher, [facility]  Active   metFORMIN  (GLUCOPHAGE ) 500 MG tablet [id number] Yes Take 1 tablet (500 mg total) by mouth 2 (two) times daily with a meal. McGowen, Aleene DEL, MD  Active   metoprolol  succinate (TOPROL -XL) 25 MG 24 hr tablet [id number] Yes Take 25 mg by mouth daily. [provider]  Active   midodrine  (PROAMATINE ) 10 MG tablet [id number] Yes TAKE 1 TABLET BEFORE GETTING OUT OF BED OR SOON AFTER, 1 TABLET AT 12PM, 1 TABLET AROUND 4PM Raford Riggs, MD  Active   Multiple Vitamins-Minerals (ICAPS AREDS 2 PO) [id number] Yes TAKE 1 CAPSULE BY MOUTH TWICE A DAY FOR MACULAR DEGENERATION [provider]  Active   omeprazole  (PRILOSEC) 20 MG capsule [id number] Yes TAKE 1 CAPSULE BY MOUTH EVERY DAY McGowen, Aleene DEL, MD  Active   pioglitazone  (ACTOS ) 15 MG tablet [id number]  TAKE 1 TABLET (15 MG TOTAL) BY MOUTH DAILY.  Patient not taking: Reported on 07/05/2024   McGowen, Philip H, MD  Active   venlafaxine  (EFFEXOR ) 75 MG tablet [id number] Yes TAKE 1 TABLET BY MOUTH EVERY DAY McGowen, Aleene DEL, MD  Active             Goals Addressed             This Visit's Progress    [facility]I Transitions of Care (TOC) Care Plan       Problems:  Recent Hospitalization for treatment of bilateral pleural effusions 06/15/2024  Patient reports that he is feeling better. Still very weak.  No shortness of breath at this time  06/22/2024 breathing better, denies shortness of breath.  No cough.  06/28/2024  Patient reports that his breathing is much improved. Reports decrease in weakness, improved energy.  Eating well.07/05/2024 Breathing is better - no shortness of breath and or coughing.  New onset of Afib 06/15/2024  Denies any irregular heart beat at this time.  Taking medications as prescribed. No bleeding. 06/22/2024  denies any epsiodes in the last. 06/28/2024  Denies any noticeable palpations.  BP stable at home per patient.  Reports decrease in nose bleeds. 07/05/2024 decreased nose bleeds. No afib.  Weight loss of 80 plus pounds- weak 06/15/2024  Weight today of 187.  Additional weight loss since hospital discharge. 06/22/2024  energy improved. No falls using a walker. Weight 192 today. Reports appetite is better today. No diarrhea. Continues to drink ensure. Sleeping well. 06/28/2024  Reports appetite is better and he is eating well.  Feeling better overall. Active with home health nursing and PT will start next week. 07/05/2024  weight 195. No weight loss. Eating well.  Sore in nose and nosebleeds. 06/28/2024  reports decrease in nose bleeds. Continues to have a  sore in his nose but overall improved.  Denies any new concerns today.   Goal:  Over the next 30 days, the patient will not experience hospital readmission  Interventions:  Transitions of Care: Doctor Visits  - discussed the importance of doctor visits- Reviewed use of antibiotic ointment in his nose for the sore. Reviewed bleeding control. Encouraged humidifier. Encouraged patient to continue this for his nose Encouraged patient to call MD for worsening bleeding or any other concerns.  Encoruaged patient to eat and drink well. Reviewed improved appetite.  Reviewed importance of being active in a safe manner. He is currently walking in the home.  Pt pending start.  Reviewed the completion of the [facility] program and patient has agreed to complex case management. Referral placed.   AFIB Interventions: Counseled on increased risk of stroke due to Afib and benefits of anticoagulation for stroke prevention Reviewed importance of adherence  to anticoagulant exactly as prescribed Counseled on bleeding risk associated with eliquis and importance of self-monitoring for signs/symptoms of bleeding Counseled on importance of regular laboratory monitoring as prescribed Counseled on seeking medical attention after a head injury or if there is blood in the urine/stool Afib action plan reviewed Reviewed no episodes of Afib that patient has notice since hospital discharge.  Reviewed action plan for episodes of Afib and when to call 911.   Patient Self Care Activities:  Attend all scheduled provider appointments Call pharmacy for medication refills 3-7 days in advance of running out of medications Call provider office for new concerns or questions  Take medications as prescribed   call office if I gain more than 2 pounds in one day or 5 pounds in one week track weight in diary use salt in moderation watch for swelling in feet, ankles and legs every day weigh myself daily bring diary to all appointments know when to call the doctor:for any changes in condition and weight gain make a plan to eat healthy keep all lab appointments take medicine as prescribed Keep up the great work.  Continue to be active in a safe manner with walker.  Plan:   Expect a call from a care guide to schedule you with a nurse for future follow up.        Recommendation:   Continue Current Plan of Care  Follow Up Plan:   Closing From:  Transitions of Care Program  Alan Ee, RN, BSN, [employer]- Transition of Care Team.  [facility] [phone]

## 2024-07-05 NOTE — Progress Notes (Signed)
 OFFICE VISIT  07/05/2024  CC: No chief complaint on file.   Patient is a 88 y.o. male who presents accompanied by his wife for 2-week follow-up debilitation, afib, chf. A/P as of last visit: #1 new onset atrial fibrillation with rapid ventricular response. Rate is well-controlled on 25 mg Toprol -XL daily. No problems with Eliquis 5 mg twice daily. We have discussed in detail the potential benefits and risks of this medication, particularly in a patient who has a high risk of falls. Cardiology will likely do DCCV after he has been on Eliquis for a full month.   #2  Acute systolic CHF.  EF after discharge from the hospital is down to 30 to 35%. Currently with no signs or symptoms of volume overload, although he has gained some weight back--> 196.8 pounds today compared to 190.4 pounds 9 days ago.   Will continue to hold off on Lasix but we reviewed restarting this if he gains 3 pounds in a 24-hour period or 5 pounds in a week and he will let us  know if he has to do this. In the meantime, GDMT is the plan.  This is somewhat limited by soft blood pressure and renal insufficiency. He will continue Toprol -XL 25 mg a day.  No ACE/ARB/ARNI at this time but if bp goes up we may start one cautiously.  We'll start jardiance 10mg  today.    3.  Recent acute right parotid gland swelling--> resolved over the last week with Keflex.   4.  Orthostatic hypotension, history of. He is off midodrine  since the hospitalization.   #5 diabetes without complication, well-controlled. Hemoglobin A1c 6.8% in the hospital.  Hold off on starting Mounjaro  in the setting of his weight loss/acute illness lately.  Decrease metformin  500 mg twice daily and continue Actos  15 mg a day. Plan repeat hemoglobin A1c in 3 months.   #6 chronic renal insufficiency stage IIIa. Serum creatinine 1.37, GFR 50 at last check about a week ago. Electrolytes were normal.   7.  Debilitated patient, unsteady gait, history of recurrent  falls. See #1 above regarding discussion about risks of anticoagulation in this setting. He is improving. Home health nursing and PT has been set up.  INTERIM HX: Jason [name] is feeling better, getting his energy back some and eating better. He does not feel any palpitations or racing heart.  No dizziness.  No falls. No abdominal or lower extremity swelling.  He has some intermittent bleeding from right nostril, has a sore in the nostril. Small amounts of blood, occurs most days.  He is taking aspirin and Eliquis. Not taking Lasix for now.  ROS as above, plus--> no fevers, no CP, no dizziness, no HAs, no rashes, no melena/hematochezia.  No polyuria or polydipsia.  No myalgias or arthralgias.  No focal weakness, paresthesias, or tremors.  No acute vision or hearing abnormalities.  No dysuria or unusual/new urinary urgency or frequency.   No n/v/d or abd pain.    Past Medical History:  Diagnosis Date   Anxiety and depression    Ascending aortic aneurysm 01/22/2022   Bilateral shoulder pain, unspecified chronicity    XR 10/2023-->+Bilat RC tendonopathy+AC arth and mild GH arth on R.  Severe GH arth/RC arthropathy on L   CAD in native artery 01/22/2022   Chronic renal insufficiency, stage 3 (moderate)    Diabetes mellitus without complication (HCC)    Gait instability 01/22/2022   GERD (gastroesophageal reflux disease)    Gout    Hypercholesterolemia  Hypertension    Lumbar compression fracture (HCC)    L2, 20%, chronic (imaging 2024).  DEXA -1.0 04/2023   Orthostatic hypotension    OSA (obstructive sleep apnea)    Osteoarthritis of left hip    + troch burs   Prostate cancer (HCC)    1990-->prostatectomy, released from urol x many years   Urinary incontinence     Past Surgical History:  Procedure Laterality Date   CHOLECYSTECTOMY, LAPAROSCOPIC     1992   DEXA     T score -1.0 Sept 2024   ROBOT ASSISTED LAPAROSCOPIC RADICAL PROSTATECTOMY     1999   ROTATOR CUFF REPAIR      Left 1991, right 1997   TOTAL KNEE ARTHROPLASTY     bilat 2004   TRANSTHORACIC ECHOCARDIOGRAM     11/24/22, EF nl, +LVH with no WMA, grd I DD, valves ok (no amyloid changes).  05/2024 (new onset a-fib): EF 30-35 LV hypokin, mod MR    Outpatient Medications Prior to Visit  Medication Sig Dispense Refill   allopurinol  (ZYLOPRIM ) 300 MG tablet TAKE 1 TABLET BY MOUTH EVERY DAY 90 tablet 0   apixaban (ELIQUIS) 5 MG TABS tablet Take 5 mg by mouth 2 (two) times daily.     aspirin EC 81 MG tablet Take 1 tablet every day by oral route.     atorvastatin  (LIPITOR) 40 MG tablet TAKE 1 TABLET BY MOUTH EVERY DAY 90 tablet 0   Continuous Glucose Receiver (FREESTYLE LIBRE 3 READER) DEVI Inject 1 each into the skin every 14 (fourteen) days. 2 each 2   Continuous Glucose Sensor (FREESTYLE LIBRE 3 PLUS SENSOR) MISC Change sensor every 15 days. 2 each 6   empagliflozin (JARDIANCE) 10 MG TABS tablet Take 1 tablet (10 mg total) by mouth daily. 90 tablet 1   furosemide (LASIX) 40 MG tablet Take 40 mg by mouth daily.     glucose blood test strip E11.9 Use to test blood sugar twice daily or as needed 100 each 12   metFORMIN  (GLUCOPHAGE ) 500 MG tablet Take 1 tablet (500 mg total) by mouth 2 (two) times daily with a meal.     metoprolol  succinate (TOPROL -XL) 25 MG 24 hr tablet Take 25 mg by mouth daily.     midodrine  (PROAMATINE ) 10 MG tablet TAKE 1 TABLET BEFORE GETTING OUT OF BED OR SOON AFTER, 1 TABLET AT 12PM, 1 TABLET AROUND 4PM 270 tablet 2   Multiple Vitamins-Minerals (ICAPS AREDS 2 PO) TAKE 1 CAPSULE BY MOUTH TWICE A DAY FOR MACULAR DEGENERATION     omeprazole  (PRILOSEC) 20 MG capsule TAKE 1 CAPSULE BY MOUTH EVERY DAY 90 capsule 1   venlafaxine  (EFFEXOR ) 75 MG tablet TAKE 1 TABLET BY MOUTH EVERY DAY 90 tablet 0   pioglitazone  (ACTOS ) 15 MG tablet TAKE 1 TABLET (15 MG TOTAL) BY MOUTH DAILY. (Patient not taking: Reported on 07/05/2024) 90 tablet 0   No facility-administered medications prior to visit.     Allergies  Allergen Reactions   Lisinopril Cough, Swelling and Other (See Comments)    Review of Systems As per HPI  PE:    07/05/2024    1:22 PM 07/05/2024   11:26 AM 06/28/2024    9:27 AM  Vitals with BMI  Weight 199 lbs 6 oz 195 lbs 195 lbs 3 oz  BMI 27.82 27.21 27.24  Systolic 106 134 872  Diastolic 70 86 83  Pulse 75 89      Physical Exam  Gen: Alert, well appearing.  Patient is oriented to person, place, time, and situation. AFFECT: pleasant, lucid thought and speech. Right nostril with superficial mucosal ulceration with crusting.  No blood. Cardiovascular: Irregularly irregular, pulse approximately 80, no murmur or rub. Lungs are clear bilaterally, breathing is nonlabored. Extremities show no cyanosis or edema.  LABS:  Last CBC Lab Results  Component Value Date   WBC 7.4 06/12/2024   HGB 13.8 06/12/2024   HCT 43.0 06/12/2024   MCV 92.9 06/12/2024   MCH 29.8 06/12/2024   RDW 13.4 06/12/2024   PLT 255 06/12/2024   Last metabolic panel Lab Results  Component Value Date   GLUCOSE 174 (H) 06/12/2024   NA 137 06/12/2024   K 4.2 06/12/2024   CL 92 (L) 06/12/2024   CO2 28 06/12/2024   BUN 40 (H) 06/12/2024   CREATININE 1.37 (H) 06/12/2024   EGFR 50 (L) 06/12/2024   CALCIUM  9.4 06/12/2024   PROT 6.7 04/17/2024   ALBUMIN 4.5 10/17/2023   BILITOT 0.7 04/17/2024   ALKPHOS 94 10/17/2023   AST 21 04/17/2024   ALT 15 04/17/2024   Last hemoglobin A1c Lab Results  Component Value Date   HGBA1C 6.1 (A) 04/17/2024   HGBA1C 6.1 04/17/2024   HGBA1C 6.1 04/17/2024   HGBA1C 6.1 04/17/2024   IMPRESSION AND PLAN:  1 new onset atrial fibrillation with rapid ventricular response. Rate is well-controlled on 25 mg Toprol -XL daily. No problems with Eliquis 5 mg twice daily. We have discussed in detail the potential benefits and risks of this medication, particularly in a patient who has a high risk of falls. Cardiology will likely do DCCV after he has been  on Eliquis for a full month--> next cardiology follow-up is 07/16/2024. STOP ASPIRIN  #2  Acute systolic CHF.  EF after discharge from the hospital is down to 30 to 35%. Currently with no signs or symptoms of volume overload, although he has gained some weight back--> weight at home lately has been around 194 to 196 pounds.   Will continue to hold off on Lasix but we reviewed restarting this if he gains 3 pounds in a 24-hour period or 5 pounds in a week and he will let us  know if he has to do this. In the meantime, GDMT is the plan.  This is somewhat limited by soft blood pressure and renal insufficiency. He will continue Toprol -XL 25 mg a day.  No ACE/ARB/ARNI at this time but if bp goes up we may start one cautiously.   Recently started Jardiance 10 mg a day.  #3 diabetes without complication, well-controlled. Hemoglobin A1c 6.8% in the hospital. He recently saw his endocrinologist for follow-up: He will remain off of his Mounjaro  for about 6 months. His Actos  was stopped. He remains on metformin  500 mg twice a day and Jardiance 10 mg a day.   #4 chronic renal insufficiency stage IIIa. Serum creatinine 1.37, GFR 50 at last check about 3 wks ago. Electrolytes were normal.   5.  Debilitated patient, unsteady gait, history of recurrent falls. See #1 above regarding discussion about risks of anticoagulation in this setting. He is improving. Home health nursing and PT has been set up.  6.  Right nostril ulceration, possibly staph infection. Bactroban  ointment 3 times daily with Q-tip.  An After Visit Summary was printed and given to the patient.  FOLLOW UP: No follow-ups on file.  Signed:  Gerlene Hockey, MD           07/05/2024

## 2024-07-05 NOTE — Patient Instructions (Signed)
 Visit Information  Thank you for taking time to visit with me today. Please don't hesitate to contact me if I can be of assistance to you before our next scheduled telephone appointment.  Following are the goals we discussed today:   Goals Addressed             This Visit's Progress    COMPLETED: [facility] Transitions of Care (TOC) Care Plan       Problems:  Recent Hospitalization for treatment of bilateral pleural effusions 06/15/2024  Patient reports that he is feeling better. Still very weak.  No shortness of breath at this time  06/22/2024 breathing better, denies shortness of breath.  No cough.  06/28/2024  Patient reports that his breathing is much improved. Reports decrease in weakness, improved energy. Eating well.07/05/2024 Breathing is better - no shortness of breath and or coughing.  New onset of Afib 06/15/2024  Denies any irregular heart beat at this time.  Taking medications as prescribed. No bleeding. 06/22/2024  denies any epsiodes in the last. 06/28/2024  Denies any noticeable palpations.  BP stable at home per patient.  Reports decrease in nose bleeds. 07/05/2024 decreased nose bleeds. No afib.  Weight loss of 80 plus pounds- weak 06/15/2024  Weight today of 187.  Additional weight loss since hospital discharge. 06/22/2024  energy improved. No falls using a walker. Weight 192 today. Reports appetite is better today. No diarrhea. Continues to drink ensure. Sleeping well. 06/28/2024  Reports appetite is better and he is eating well.  Feeling better overall. Active with home health nursing and PT will start next week. 07/05/2024  weight 195. No weight loss. Eating well.  Sore in nose and nosebleeds. 06/28/2024  reports decrease in nose bleeds. Continues to have a sore in his nose but overall improved.  Denies any new concerns today.   Goal:  Over the next 30 days, the patient will not experience hospital readmission  Interventions:  Transitions of Care: Doctor Visits  - discussed the  importance of doctor visits- Reviewed use of antibiotic ointment in his nose for the sore. Reviewed bleeding control. Encouraged humidifier. Encouraged patient to continue this for his nose Encouraged patient to call MD for worsening bleeding or any other concerns.  Encoruaged patient to eat and drink well. Reviewed improved appetite.  Reviewed importance of being active in a safe manner. He is currently walking in the home.  Pt pending start.  Reviewed the completion of the [facility] program and patient has agreed to complex case management. Referral placed.   AFIB Interventions: Counseled on increased risk of stroke due to Afib and benefits of anticoagulation for stroke prevention Reviewed importance of adherence to anticoagulant exactly as prescribed Counseled on bleeding risk associated with eliquis and importance of self-monitoring for signs/symptoms of bleeding Counseled on importance of regular laboratory monitoring as prescribed Counseled on seeking medical attention after a head injury or if there is blood in the urine/stool Afib action plan reviewed Reviewed no episodes of Afib that patient has notice since hospital discharge.  Reviewed action plan for episodes of Afib and when to call 911.   Patient Self Care Activities:  Attend all scheduled provider appointments Call pharmacy for medication refills 3-7 days in advance of running out of medications Call provider office for new concerns or questions  Take medications as prescribed   call office if I gain more than 2 pounds in one day or 5 pounds in one week track weight in diary use salt in moderation watch  for swelling in feet, ankles and legs every day weigh myself daily bring diary to all appointments know when to call the doctor:for any changes in condition and weight gain make a plan to eat healthy keep all lab appointments take medicine as prescribed Keep up the great work.  Continue to be active in a safe manner with  walker.  Plan:   Expect a call from a care guide to schedule you with a nurse for future follow up.        Please call the care guide team at [phone] if have a question about your appointments.   If you are experiencing a Mental Health or Behavioral Health Crisis or need someone to talk to, please call the Suicide and Crisis Lifeline: 988 call the [place]  National Suicide Prevention Lifeline: 5072132969 or TTY: 301-482-1562 TTY 779 815 1145) to talk to a trained counselor call 1-800-273-TALK (toll free, 24 hour hotline) call 911   Care plan and visit instructions communicated with the patient verbally today. Patient agrees to receive a copy in MyChart. Active MyChart status and patient understanding of how to access instructions and care plan via MyChart confirmed with patient.     Alan Ee, RN, BSN, CEN [employer]- Transition of Care Team.  [facility] [phone]

## 2024-07-08 ENCOUNTER — Other Ambulatory Visit: Payer: Self-pay | Admitting: Family Medicine

## 2024-07-08 DIAGNOSIS — K219 Gastro-esophageal reflux disease without esophagitis: Secondary | ICD-10-CM

## 2024-07-16 DIAGNOSIS — Z1330 Encounter for screening examination for mental health and behavioral disorders, unspecified: Secondary | ICD-10-CM | POA: Diagnosis not present

## 2024-07-16 DIAGNOSIS — I10 Essential (primary) hypertension: Secondary | ICD-10-CM | POA: Diagnosis not present

## 2024-07-16 DIAGNOSIS — I4819 Other persistent atrial fibrillation: Secondary | ICD-10-CM | POA: Diagnosis not present

## 2024-07-16 DIAGNOSIS — I5022 Chronic systolic (congestive) heart failure: Secondary | ICD-10-CM | POA: Diagnosis not present

## 2024-07-16 DIAGNOSIS — E785 Hyperlipidemia, unspecified: Secondary | ICD-10-CM | POA: Diagnosis not present

## 2024-07-16 DIAGNOSIS — E1169 Type 2 diabetes mellitus with other specified complication: Secondary | ICD-10-CM | POA: Diagnosis not present

## 2024-07-16 DIAGNOSIS — I7121 Aneurysm of the ascending aorta, without rupture: Secondary | ICD-10-CM | POA: Diagnosis not present

## 2024-07-17 ENCOUNTER — Encounter: Payer: Self-pay | Admitting: Family Medicine

## 2024-07-17 ENCOUNTER — Ambulatory Visit (INDEPENDENT_AMBULATORY_CARE_PROVIDER_SITE_OTHER): Admitting: Family Medicine

## 2024-07-17 VITALS — BP 128/78 | HR 86 | Temp 96.5°F | Resp 12 | Wt 195.6 lb

## 2024-07-17 DIAGNOSIS — E663 Overweight: Secondary | ICD-10-CM | POA: Diagnosis not present

## 2024-07-17 DIAGNOSIS — K7460 Unspecified cirrhosis of liver: Secondary | ICD-10-CM | POA: Diagnosis not present

## 2024-07-17 DIAGNOSIS — K219 Gastro-esophageal reflux disease without esophagitis: Secondary | ICD-10-CM | POA: Diagnosis not present

## 2024-07-17 DIAGNOSIS — F411 Generalized anxiety disorder: Secondary | ICD-10-CM | POA: Diagnosis not present

## 2024-07-17 DIAGNOSIS — Z6826 Body mass index (BMI) 26.0-26.9, adult: Secondary | ICD-10-CM | POA: Diagnosis not present

## 2024-07-17 DIAGNOSIS — M1612 Unilateral primary osteoarthritis, left hip: Secondary | ICD-10-CM | POA: Diagnosis not present

## 2024-07-17 DIAGNOSIS — I130 Hypertensive heart and chronic kidney disease with heart failure and stage 1 through stage 4 chronic kidney disease, or unspecified chronic kidney disease: Secondary | ICD-10-CM | POA: Diagnosis not present

## 2024-07-17 DIAGNOSIS — N2889 Other specified disorders of kidney and ureter: Secondary | ICD-10-CM

## 2024-07-17 DIAGNOSIS — F32A Depression, unspecified: Secondary | ICD-10-CM | POA: Diagnosis not present

## 2024-07-17 DIAGNOSIS — E1122 Type 2 diabetes mellitus with diabetic chronic kidney disease: Secondary | ICD-10-CM | POA: Diagnosis not present

## 2024-07-17 DIAGNOSIS — I7121 Aneurysm of the ascending aorta, without rupture: Secondary | ICD-10-CM | POA: Diagnosis not present

## 2024-07-17 DIAGNOSIS — I2510 Atherosclerotic heart disease of native coronary artery without angina pectoris: Secondary | ICD-10-CM | POA: Diagnosis not present

## 2024-07-17 DIAGNOSIS — N1831 Chronic kidney disease, stage 3a: Secondary | ICD-10-CM | POA: Diagnosis not present

## 2024-07-17 DIAGNOSIS — I951 Orthostatic hypotension: Secondary | ICD-10-CM | POA: Diagnosis not present

## 2024-07-17 DIAGNOSIS — I5021 Acute systolic (congestive) heart failure: Secondary | ICD-10-CM | POA: Diagnosis not present

## 2024-07-17 DIAGNOSIS — M109 Gout, unspecified: Secondary | ICD-10-CM | POA: Diagnosis not present

## 2024-07-17 DIAGNOSIS — I4891 Unspecified atrial fibrillation: Secondary | ICD-10-CM

## 2024-07-17 DIAGNOSIS — M1030 Gout due to renal impairment, unspecified site: Secondary | ICD-10-CM | POA: Diagnosis not present

## 2024-07-17 DIAGNOSIS — I081 Rheumatic disorders of both mitral and tricuspid valves: Secondary | ICD-10-CM | POA: Diagnosis not present

## 2024-07-17 DIAGNOSIS — G4733 Obstructive sleep apnea (adult) (pediatric): Secondary | ICD-10-CM | POA: Diagnosis not present

## 2024-07-17 DIAGNOSIS — E785 Hyperlipidemia, unspecified: Secondary | ICD-10-CM | POA: Diagnosis not present

## 2024-07-17 DIAGNOSIS — N481 Balanitis: Secondary | ICD-10-CM | POA: Diagnosis not present

## 2024-07-17 DIAGNOSIS — R5381 Other malaise: Secondary | ICD-10-CM

## 2024-07-17 DIAGNOSIS — E876 Hypokalemia: Secondary | ICD-10-CM | POA: Diagnosis not present

## 2024-07-17 DIAGNOSIS — Z7984 Long term (current) use of oral hypoglycemic drugs: Secondary | ICD-10-CM | POA: Diagnosis not present

## 2024-07-17 DIAGNOSIS — E7800 Pure hypercholesterolemia, unspecified: Secondary | ICD-10-CM | POA: Diagnosis not present

## 2024-07-17 DIAGNOSIS — I712 Thoracic aortic aneurysm, without rupture, unspecified: Secondary | ICD-10-CM | POA: Diagnosis not present

## 2024-07-17 DIAGNOSIS — Z7982 Long term (current) use of aspirin: Secondary | ICD-10-CM | POA: Diagnosis not present

## 2024-07-17 DIAGNOSIS — Z7901 Long term (current) use of anticoagulants: Secondary | ICD-10-CM | POA: Diagnosis not present

## 2024-07-17 DIAGNOSIS — J181 Lobar pneumonia, unspecified organism: Secondary | ICD-10-CM | POA: Diagnosis not present

## 2024-07-17 MED ORDER — ATORVASTATIN CALCIUM 40 MG PO TABS: 40.0000 mg | ORAL_TABLET | Freq: Every day | ORAL | 3 refills | Status: AC

## 2024-07-17 MED ORDER — ALLOPURINOL 300 MG PO TABS: 300.0000 mg | ORAL_TABLET | Freq: Every day | ORAL | 3 refills | Status: AC

## 2024-07-17 MED ORDER — NYSTATIN 100000 UNIT/GM EX CREA: 1.0000 | TOPICAL_CREAM | Freq: Two times a day (BID) | CUTANEOUS | 3 refills | Status: AC

## 2024-07-17 NOTE — Progress Notes (Signed)
 OFFICE VISIT  07/17/2024  CC:  Chief Complaint  Patient presents with   Medical Management of Chronic Issues    Patient is a 88 y.o. male who presents accompanied by his wife for 2 week follow-up diabetes, hypertension, hyperlipidemia, chronic renal insufficiency stage III, debilitated patient. A/P as of last visit: 1 new onset atrial fibrillation with rapid ventricular response. Rate is well-controlled on 25 mg Toprol -XL daily. No problems with Eliquis 5 mg twice daily. We have discussed in detail the potential benefits and risks of this medication, particularly in a patient who has a high risk of falls. Cardiology will likely do DCCV after he has been on Eliquis for a full month--> next cardiology follow-up is 07/16/2024. STOP ASPIRIN   #2  Acute systolic CHF.  EF after discharge from the hospital is down to 30 to 35%. Currently with no signs or symptoms of volume overload, although he has gained some weight back--> weight at home lately has been around 194 to 196 pounds.   Will continue to hold off on Lasix but we reviewed restarting this if he gains 3 pounds in a 24-hour period or 5 pounds in a week and he will let us  know if he has to do this. In the meantime, GDMT is the plan.  This is somewhat limited by soft blood pressure and renal insufficiency. He will continue Toprol -XL 25 mg a day.  No ACE/ARB/ARNI at this time but if bp goes up we may start one cautiously.   Recently started Jardiance  10 mg a day.   #3 diabetes without complication, well-controlled. Hemoglobin A1c 6.8% in the hospital. He recently saw his endocrinologist for follow-up: He will remain off of his Mounjaro  for about 6 months. His Actos  was stopped. He remains on metformin  500 mg twice a day and Jardiance  10 mg a day.   #4 chronic renal insufficiency stage IIIa. Serum creatinine 1.37, GFR 50 at last check about 3 wks ago. Electrolytes were normal.   5.  Debilitated patient, unsteady gait, history of  recurrent falls. See #1 above regarding discussion about risks of anticoagulation in this setting. He is improving. Home health nursing and PT has been set up.   6.  Right nostril ulceration, possibly staph infection. Bactroban  ointment 3 times daily with Q-tip.  INTERIM HX: He saw cardiologist yesterday:  lasix 2-3 times a week restarted (first dose taken today), losartan  25mg  every day started, amiodarone 200 bid x 1 week then 200mg  once a day. DCCV scheduled for 07/24/24.  He has a rash in the penis area after starting Jardiance .  His wife used some over-the-counter miconazole and it seemed to have worked.  His diabetes is managed by endocrinology.  Past Medical History:  Diagnosis Date   Anxiety and depression    Ascending aortic aneurysm 01/22/2022   Atrial fibrillation, new onset (HCC)    05/2024   Bilateral shoulder pain, unspecified chronicity    XR 10/2023-->+Bilat RC tendonopathy+AC arth and mild GH arth on R.  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  DEXA -1.0 04/2023   Orthostatic hypotension    OSA (obstructive sleep apnea)  Osteoarthritis of left hip    + troch burs   Prostate cancer (HCC)    1990-->prostatectomy, released from urol x many years   Urinary incontinence     Past Surgical History:  Procedure Laterality Date   CHOLECYSTECTOMY, LAPAROSCOPIC     1992   DEXA     T score -1.0 Sept 2024   ROBOT ASSISTED LAPAROSCOPIC RADICAL PROSTATECTOMY     1999   ROTATOR CUFF REPAIR     Left 1991, right 1997   TOTAL KNEE ARTHROPLASTY     bilat 2004   TRANSTHORACIC ECHOCARDIOGRAM     11/24/22, EF nl, +LVH with no WMA, grd I DD, valves ok (no  amyloid changes).  05/2024 (new onset a-fib): EF 30-35 LV hypokin, mod MR    Outpatient Medications Prior to Visit  Medication Sig Dispense Refill   amiodarone (PACERONE) 200 MG tablet Take 200 mg by mouth. Take one tablet (200 mg dose) by mouth daily. Start after completion of 1 week of twice a day dose     apixaban (ELIQUIS) 5 MG TABS tablet Take 5 mg by mouth 2 (two) times daily.     Continuous Glucose Receiver (FREESTYLE LIBRE 3 READER) DEVI Inject 1 each into the skin every 14 (fourteen) days. 2 each 2   Continuous Glucose Sensor (FREESTYLE LIBRE 3 PLUS SENSOR) MISC Change sensor every 15 days. 2 each 6   empagliflozin  (JARDIANCE ) 10 MG TABS tablet Take 1 tablet (10 mg total) by mouth daily. 90 tablet 1   furosemide (LASIX) 20 MG tablet Take 20 mg by mouth 2 (two) times a week.     glucose blood test strip E11.9 Use to test blood sugar twice daily or as needed 100 each 12   losartan  (COZAAR ) 25 MG tablet Take 25 mg by mouth daily.     metFORMIN  (GLUCOPHAGE ) 500 MG tablet Take 1 tablet (500 mg total) by mouth 2 (two) times daily with a meal. 90 tablet 1   metoprolol  succinate (TOPROL -XL) 25 MG 24 hr tablet Take 25 mg by mouth daily.     midodrine  (PROAMATINE ) 10 MG tablet TAKE 1 TABLET BEFORE GETTING OUT OF BED OR SOON AFTER, 1 TABLET AT 12PM, 1 TABLET AROUND 4PM 270 tablet 2   Multiple Vitamins-Minerals (ICAPS AREDS 2 PO) TAKE 1 CAPSULE BY MOUTH TWICE A DAY FOR MACULAR DEGENERATION     mupirocin  ointment (BACTROBAN ) 2 % Apply 1 Application topically 3 (three) times daily. Apply to sore in R nostril tid 15 g 0   omeprazole  (PRILOSEC) 20 MG capsule TAKE 1 CAPSULE BY MOUTH EVERY DAY 90 capsule 1   venlafaxine  (EFFEXOR ) 75 MG tablet TAKE 1 TABLET BY MOUTH EVERY DAY 90 tablet 0   allopurinol  (ZYLOPRIM ) 300 MG tablet TAKE 1 TABLET BY MOUTH EVERY DAY 90 tablet 0   atorvastatin  (LIPITOR) 40 MG tablet TAKE 1 TABLET BY MOUTH EVERY DAY 90 tablet 0   furosemide (LASIX) 40 MG tablet Take 40 mg by mouth  daily. (Patient not taking: Reported on 07/17/2024)     No facility-administered medications prior to visit.    Allergies  Allergen Reactions   Lisinopril Cough, Swelling and Other (See Comments)    Review of Systems As per HPI  PE:    07/17/2024   10:21 AM 07/05/2024    1:22 PM 07/05/2024   11:26 AM  Vitals with BMI  Weight 195 lbs 10 oz 199 lbs 6 oz 195 lbs  BMI 27.29 27.82 27.21  Systolic 128  106 134  Diastolic 78 70 86  Pulse 86 75 89     Physical Exam  Gen: Alert, well appearing.  Patient is oriented to person, place, time, and situation. AFFECT: pleasant, lucid thought and speech. CV: irreg irreg, rate 80, no murmur or rub LUNGS: both bases with dec breath sounds, worse on the left.  No adventitious sounds.  Breathing is nonlabored. Extremities: 1+ bilateral lower extremity pitting edema. GU: Uncircumcised.  Glans and distal foreskin with mild erythema.  No classic candidal rashes present. There is a fair amount of urine on skin surface of penis/foreskin.   LABS:  Last CBC Lab Results  Component Value Date   WBC 7.4 06/12/2024   HGB 13.8 06/12/2024   HCT 43.0 06/12/2024   MCV 92.9 06/12/2024   MCH 29.8 06/12/2024   RDW 13.4 06/12/2024   PLT 255 06/12/2024   Last metabolic panel Lab Results  Component Value Date   GLUCOSE 174 (H) 06/12/2024   NA 137 06/12/2024   K 4.2 06/12/2024   CL 92 (L) 06/12/2024   CO2 28 06/12/2024   BUN 40 (H) 06/12/2024   CREATININE 1.37 (H) 06/12/2024   EGFR 50 (L) 06/12/2024   CALCIUM  9.4 06/12/2024   PROT 6.7 04/17/2024   ALBUMIN 4.5 10/17/2023   BILITOT 0.7 04/17/2024   ALKPHOS 94 10/17/2023   AST 21 04/17/2024   ALT 15 04/17/2024   Last lipids Lab Results  Component Value Date   CHOL 144 04/17/2024   HDL 42 04/17/2024   LDLCALC 81 04/17/2024   TRIG 109 04/17/2024   CHOLHDL 3.4 04/17/2024   Last hemoglobin A1c Lab Results  Component Value Date   HGBA1C 6.1 (A) 04/17/2024   HGBA1C 6.1 04/17/2024    HGBA1C 6.1 04/17/2024   HGBA1C 6.1 04/17/2024   Last thyroid  functions Lab Results  Component Value Date   TSH 2.12 10/17/2023   T4TOTAL 7.1 04/12/2022   Last vitamin D  Lab Results  Component Value Date   VD25OH 76 10/17/2023   Last vitamin B12 and Folate Lab Results  Component Value Date   VITAMINB12 422 04/12/2022   FOLATE 18.5 04/12/2022   IMPRESSION AND PLAN:  1 new onset atrial fibrillation with rapid ventricular response. Rate is well-controlled on 25 mg Toprol -XL daily. No problems with Eliquis 5 mg twice daily. We have discussed in detail the potential benefits and risks of this medication, particularly in a patient who has a high risk of falls. Cardiologist is loading amiodarone and there is plan for DCCV on 07/24/2024.   #2  Acute systolic CHF.  EF after discharge from the hospital is down to 30 to 35%. Currently with no signs or symptoms of volume overload, although he has gained some weight back--> weight at home lately has been around 194 to 196 pounds.   Cardiology suspects that his impaired EF is likely related to recent A-fib with rapid ventricular response. He will be on a schedule of Lasix 20 mg on Tuesdays and Thursdays. Losartan  25 mg started yesterday by cardiology. He will continue Toprol -XL 25 mg a day. Continue Jardiance  10 mg daily.  #3 diabetes without complication, well-controlled. Hemoglobin A1c 6.8% in the hospital approx 1 mo ago. He recently saw his endocrinologist for follow-up: He will remain off of his Mounjaro  for about 6 months (restart approx April/may 2026). He remains on metformin  500 mg twice a day and Jardiance  10 mg a day.   #4 chronic renal insufficiency stage IIIa. Serum creatinine 1.37, GFR 50  at last check about 5 wks ago. Electrolytes were normal at that time. Plan repeat labs at next follow-up in 1 month.   5.  Debilitated patient, unsteady gait, history of recurrent falls. See #1 above regarding discussion about risks of  anticoagulation in this setting. He is improving. He just started home health PT.  #6 balanitis. He will be at risk for recurrence of this.  Nystatin cream prescribed today, apply twice daily.  An After Visit Summary was printed and given to the patient.  FOLLOW UP: Return for keep appt set for 08/17/24.  Signed:  Gerlene Hockey, MD           07/17/2024

## 2024-07-19 ENCOUNTER — Other Ambulatory Visit: Payer: Self-pay | Admitting: *Deleted

## 2024-07-19 ENCOUNTER — Telehealth: Payer: Self-pay

## 2024-07-19 NOTE — Patient Instructions (Signed)
 Visit Information  Thank you for taking time to visit with me today. Please don't hesitate to contact me if I can be of assistance to you before our next scheduled appointment.  Our next appointment is by telephone on 08-01-2024 at 1:30 pm Please call the care guide team at [phone] if you need to cancel or reschedule your appointment.   Following is a copy of your care plan:   Goals Addressed             This Visit's Progress    VBCI RN Care Plan - AFIB       Problems:  Chronic Disease Management support and education needs related to Atrial Fibrillation  Goal: Over the next 90 days the Patient will attend all scheduled medical appointments: with primary care provider and specialist as evidenced by keeping all scheduled appointments        continue to work with RN Care Manager and/or Social Worker to address care management and care coordination needs related to Atrial Fibrillation as evidenced by adherence to care management team scheduled appointments     take all medications exactly as prescribed and will call provider for medication related questions as evidenced by compliance with all medications    verbalize basic understanding of Atrial Fibrillation disease process and self health management plan as evidenced by verbal explanation, recognizing/monitoring symptoms, lifestyle modifications  Interventions:   AFIB Interventions: Counseled on increased risk of stroke due to Afib and benefits of anticoagulation for stroke prevention Scheduled for cardioversion on 07-24-2024 Reviewed importance of adherence to anticoagulant exactly as prescribed Advised patient to discuss acute changes with provider Counseled on bleeding risk associated with Eliquis and importance of self-monitoring for signs/symptoms of bleeding Counseled on avoidance of NSAIDs due to increased bleeding risk with anticoagulants Counseled on importance of regular laboratory monitoring as prescribed Counseled on  seeking medical attention after a head injury or if there is blood in the urine/stool Afib action plan reviewed Screening for signs and symptoms of depression related to chronic disease state  Assessed social determinant of health barriers  Patient Self-Care Activities:  Attend all scheduled provider appointments Call provider office for new concerns or questions  Take medications as prescribed   bring symptom diary to all appointments check pulse (heart) rate before taking medicine make a plan to eat healthy take medicine as prescribed  Plan:  Telephone follow up appointment with care management team member scheduled for:  08-01-2024 at 1:30 pm          VBCI RN Care Plan - HF       Problems:  Chronic Disease Management support and education needs related to CHF  Goal: Over the next 90 days the Patient will attend all scheduled medical appointments: with primary care provider and specialist as evidenced by keeping all scheduled appointments        continue to work with RN Care Manager and/or Social Worker to address care management and care coordination needs related to CHF as evidenced by adherence to care management team scheduled appointments     take all medications exactly as prescribed and will call provider for medication related questions as evidenced by compliance with all medications    verbalize basic understanding of CHF disease process and self health management plan as evidenced by verbal explanation, recognizing/monitoring symptoms, lifestyle modifications  Interventions:   Heart Failure Interventions: Basic overview and discussion of pathophysiology of Heart Failure reviewed Provided education on low sodium diet Reviewed Heart Failure Action Plan in  depth and provided written copy Advised patient to weigh each morning after emptying bladder Discussed importance of daily weight and advised patient to weigh and record daily Reviewed role of diuretics in prevention  of fluid overload and management of heart failure; Discussed the importance of keeping all appointments with provider Provided patient with education about the role of exercise in the management of heart failure Advised patient to discuss increase in weight gain with provider Screening for signs and symptoms of depression related to chronic disease state  Assessed social determinant of health barriers  Wt Readings from Last 3 Encounters:  07/19/24 191 lb (86.6 kg)  07/17/24 195 lb 9.6 oz (88.7 kg)  07/05/24 199 lb 6.4 oz (90.4 kg)     Patient Self-Care Activities:  Attend all scheduled provider appointments Call provider office for new concerns or questions  Take medications as prescribed   call office if I gain more than 2 pounds in one day or 5 pounds in one week use salt in moderation watch for swelling in feet, ankles and legs every day weigh myself daily follow rescue plan if symptoms flare-up eat more whole grains, fruits and vegetables, lean meats and healthy fats  Plan:  Telephone follow up appointment with care management team member scheduled for:  08-01-2024 at 1:30 pm             Please call the Suicide and Crisis Lifeline: 988 call the [place]  National Suicide Prevention Lifeline: 845-449-9125 or TTY: 216-664-2809 TTY 520 094 8540) to talk to a trained counselor call 1-800-273-TALK (toll free, 24 hour hotline) if you are experiencing a Mental Health or Behavioral Health Crisis or need someone to talk to.  Patient verbalized understanding of Care plan and visit instructions communicated this visit  Rosina Forte, BSN RN [facility], [facility] RN Care Manager Direct Dial: [phone]  Fax: [phone]

## 2024-07-19 NOTE — Telephone Encounter (Signed)
 Home health orders received 120/4/25 for [facility] health initiation orders: Yes.  Home health re-certification orders: No. Patient last seen by ordering physician for this condition: 07/17/24. Must be less than 90 days for re-certification and less than 30 days prior for initiation. Visit must have been for the condition the orders are being placed.  Patient meets criteria for Physician to sign orders: Yes.        Current med list has been attached: No        Orders placed on physicians desk for signature: 07/19/24 (date) If patient does not meet criteria for orders to be signed: pt was called to schedule appt. Appt is scheduled for 08/17/24.   Placed on PCP desk to review and sign, if appropriate.   Alysiah Suppa D Sussie Minor

## 2024-07-19 NOTE — Telephone Encounter (Signed)
 Received fax from [facility]   Order # [id number]   [place] inbox front office

## 2024-07-19 NOTE — Patient Outreach (Signed)
 Complex Care Management   Visit Note  07/19/2024  Name:  Jason [name] MRN: [id number] DOB: 12/12/1935  Situation: Referral received for Complex Care Management related to Heart Failure and Atrial Fibrillation I obtained verbal consent from Patient.  Visit completed with Patient  on the phone  Background:   Past Medical History:  Diagnosis Date   Anxiety and depression    Ascending aortic aneurysm 01/22/2022   Atrial fibrillation, new onset (HCC)    05/2024   Bilateral shoulder pain, unspecified chronicity    XR 10/2023-->+Bilat RC tendonopathy+AC arth and mild GH arth on R.  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  DEXA -1.0 04/2023   Orthostatic hypotension    OSA (obstructive sleep apnea)    Osteoarthritis of left hip    + troch burs   Prostate cancer (HCC)    1990-->prostatectomy, released from urol x many years   Urinary incontinence     Assessment: Patient Reported Symptoms:  Cognitive Cognitive Status: No symptoms reported Cognitive/Intellectual Conditions Management [RPT]: None reported or documented in medical history or problem list   Health Maintenance Behaviors: Annual physical exam Healing Pattern: Slow Health Facilitated by: Rest  Neurological Neurological Review of Symptoms: No symptoms reported Neurological Management Strategies: Routine screening Neurological Self-Management Outcome: 4 (good)  HEENT HEENT Symptoms Reported: No symptoms reported HEENT Management Strategies: Routine screening HEENT Self-Management Outcome: 4 (good)    Cardiovascular Cardiovascular Symptoms Reported: No symptoms reported Does patient have uncontrolled  Hypertension?: No Cardiovascular Management Strategies: Routine screening Weight: 191 lb (86.6 kg) (patient reported) Cardiovascular Self-Management Outcome: 4 (good)  Respiratory Respiratory Symptoms Reported: No symptoms reported Respiratory Management Strategies: Routine screening Respiratory Self-Management Outcome: 4 (good)  Endocrine Endocrine Symptoms Reported: No symptoms reported Is patient diabetic?: Yes Is patient checking blood sugars at home?: Yes List most recent blood sugar readings, include date and time of day: 123 Endocrine Self-Management Outcome: 5 (very good)  Gastrointestinal Gastrointestinal Symptoms Reported: Constipation Gastrointestinal Management Strategies: Medication therapy, Coping strategies Gastrointestinal Self-Management Outcome: 4 (good)    Genitourinary Genitourinary Symptoms Reported: No symptoms reported Genitourinary Self-Management Outcome: 4 (good)  Integumentary Integumentary Symptoms Reported: No symptoms reported Skin Management Strategies: Routine screening Skin Self-Management Outcome: 4 (good)  Musculoskeletal Musculoskelatal Symptoms Reviewed: Unsteady gait, Weakness Musculoskeletal Management Strategies: Medical device Musculoskeletal Self-Management Outcome: 4 (good) Falls in the past year?: Yes Number of falls in past year: 2 or more Was there an injury with Fall?: No Fall Risk Category Calculator: 2 Patient Fall Risk Level: Moderate Fall Risk Patient at Risk for Falls Due to: History of fall(s), Impaired balance/gait Fall risk Follow up: Falls evaluation completed, Education provided  Psychosocial Psychosocial Symptoms Reported: No symptoms reported Behavioral Management Strategies: Coping strategies Behavioral Health Self-Management Outcome: 4 (good) Major Change/Loss/Stressor/Fears (CP): Denies Techniques to Cope with Loss/Stress/Change: Not applicable Quality of Family Relationships: involved, helpful, supportive Do you feel  physically threatened by others?: No    07/19/2024    PHQ2-9 Depression Screening   Little interest or pleasure in doing things Not at all  Feeling down, depressed, or hopeless Not at all  PHQ-2 - Total Score 0  Trouble  falling or staying asleep, or sleeping too much    Feeling tired or having little energy    Poor appetite or overeating     Feeling bad about yourself - or that you are a failure or have let yourself or your family down    Trouble concentrating on things, such as reading the newspaper or watching television    Moving or speaking so slowly that other people could have noticed.  Or the opposite - being so fidgety or restless that you have been moving around a lot more than usual    Thoughts that you would be better off dead, or hurting yourself in some way    PHQ2-9 Total Score    If you checked off any problems, how difficult have these problems made it for you to do your work, take care of things at home, or get along with other people    Depression Interventions/Treatment      Today's Vitals   07/19/24 1150  BP: (!) 144/88  Weight:    Pain Scale: 0-10 Pain Score: 0-No pain  Medications Reviewed Today     Reviewed by Bertrum Rosina HERO, RN (Registered Nurse) on 07/19/24 at 1144  Med List Status: <None>   Medication Order Taking? Sig Documenting Provider Last Dose Status Informant  allopurinol  (ZYLOPRIM ) 300 MG tablet [id number] Yes Take 1 tablet (300 mg total) by mouth daily. McGowen, Philip H, MD  Active   amiodarone (PACERONE) 200 MG tablet [id number] Yes Take 200 mg by mouth. Take one tablet (200 mg dose) by mouth daily. Start after completion of 1 week of twice a day dose [provider]  Active   apixaban (ELIQUIS) 5 MG TABS tablet [id number] Yes Take 5 mg by mouth 2 (two) times daily. [provider]  Active   atorvastatin  (LIPITOR) 40 MG tablet [id number] Yes Take 1 tablet (40 mg total) by mouth daily. McGowen, Aleene DEL, MD  Active   Continuous  Glucose Receiver (FREESTYLE LIBRE 3 READER) DEVI [id number] Yes Inject 1 each into the skin every 14 (fourteen) days. McGowen, Aleene DEL, MD  Active   Continuous Glucose Sensor (FREESTYLE LIBRE 3 PLUS SENSOR) [place] [id number] Yes Change sensor every 15 days. McGowen, Philip H, MD  Active   empagliflozin  (JARDIANCE ) 10 MG TABS tablet [id number] Yes Take 1 tablet (10 mg total) by mouth daily. McGowen, Philip H, MD  Active   furosemide (LASIX) 20 MG tablet [id number] Yes Take 20 mg by mouth 2 (two) times a week. [provider]  Active   glucose blood test strip [id number] Yes E11.9 Use to test blood sugar twice daily or as needed Pavero, Christopher, [facility]  Active   losartan  (COZAAR ) 25 MG tablet [id number] Yes Take 25 mg by mouth daily. [provider]  Active   metFORMIN  (GLUCOPHAGE ) 500 MG tablet [id number] Yes Take 1 tablet (500 mg total) by mouth 2 (two) times daily with a meal. McGowen, Aleene DEL, MD  Active   metoprolol  succinate (TOPROL -XL) 25 MG 24 hr tablet [id number] Yes Take 25 mg by mouth daily. [provider]  Active   midodrine  (PROAMATINE ) 10 MG tablet [id number]  TAKE 1 TABLET BEFORE GETTING OUT OF BED OR SOON AFTER, 1 TABLET AT 12PM, 1 TABLET AROUND 4PM  Patient not taking: Reported on 07/19/2024   Raford Riggs, MD  Consider Medication Status and Discontinue   Multiple Vitamins-Minerals (ICAPS AREDS 2 PO) [id number] Yes TAKE 1 CAPSULE BY MOUTH TWICE A DAY FOR MACULAR DEGENERATION [provider]  Active   mupirocin  ointment (BACTROBAN ) 2 % [id number] Yes Apply 1 Application topically 3 (three) times daily. Apply to sore in R nostril tid McGowen, Philip H, MD  Active   nystatin cream (MYCOSTATIN) [id number] Yes Apply 1 Application topically 2 (two) times daily. McGowen, Philip H, MD  Active   omeprazole  (PRILOSEC) 20 MG capsule [id number] Yes TAKE 1 CAPSULE BY MOUTH EVERY DAY McGowen, Aleene DEL, MD  Active   venlafaxine  (EFFEXOR ) 75 MG tablet [id number] Yes TAKE 1  TABLET BY MOUTH EVERY DAY McGowen, Aleene DEL, MD  Active             Recommendation:   Continue Current Plan of Care  Follow Up Plan:   Telephone follow-up in 2 weeks  Rosina Forte, BSN RN [facility], [facility] RN Care Manager Direct Dial: [phone]  Fax: [phone]

## 2024-07-24 DIAGNOSIS — I4819 Other persistent atrial fibrillation: Secondary | ICD-10-CM | POA: Diagnosis not present

## 2024-08-01 ENCOUNTER — Telehealth: Payer: Self-pay | Admitting: *Deleted

## 2024-08-01 ENCOUNTER — Encounter: Payer: Self-pay | Admitting: *Deleted

## 2024-08-01 ENCOUNTER — Other Ambulatory Visit: Payer: Self-pay | Admitting: *Deleted

## 2024-08-01 NOTE — Patient Instructions (Signed)
 Jason VEAR Pa - I am sorry I was unable to reach you today for our scheduled appointment. I work with Jason [name], Jason VEAR, MD and am calling to support your healthcare needs. Please contact me at [phone] at your earliest convenience. I look forward to speaking with you soon.   Thank you,  Jason [name], BSN RN [facility], [facility] RN Care Manager Direct Dial: [phone]  Fax: [phone]

## 2024-08-01 NOTE — Patient Instructions (Signed)
 Visit Information  Thank you for taking time to visit with me today. Please don't hesitate to contact me if I can be of assistance to you before our next scheduled appointment.  Your next care management appointment is by telephone on 08-31-2024 at 10:00 am  Telephone follow-up in 1 month  Please call the care guide team at [phone] if you need to cancel, schedule, or reschedule an appointment.   Please call the Suicide and Crisis Lifeline: 988 call the [place]  National Suicide Prevention Lifeline: (867)155-2789 or TTY: 581 303 3654 TTY 779-883-8749) to talk to a trained counselor call 1-800-273-TALK (toll free, 24 hour hotline) if you are experiencing a Mental Health or Behavioral Health Crisis or need someone to talk to.  Rosina Forte, BSN RN [facility], [facility] RN Care Manager Direct Dial: [phone]  Fax: [phone]

## 2024-08-01 NOTE — Patient Outreach (Signed)
 Complex Care Management   Visit Note  08/01/2024  Name:  Jason [name] MRN: [id number] DOB: 1936-07-01  Situation: Referral received for Complex Care Management related to Heart Failure and Atrial Fibrillation I obtained verbal consent from Patient.  Visit completed with Patient  on the phone  Background:   Past Medical History:  Diagnosis Date   Anxiety and depression    Ascending aortic aneurysm 01/22/2022   Atrial fibrillation, new onset (HCC)    05/2024   Bilateral shoulder pain, unspecified chronicity    XR 10/2023-->+Bilat RC tendonopathy+AC arth and mild GH arth on R.  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  DEXA -1.0 04/2023   Orthostatic hypotension    OSA (obstructive sleep apnea)    Osteoarthritis of left hip    + troch burs   Prostate cancer (HCC)    1990-->prostatectomy, released from urol x many years   Urinary incontinence     Assessment: Patient Reported Symptoms:  Cognitive Cognitive Status: No symptoms reported Cognitive/Intellectual Conditions Management [RPT]: None reported or documented in medical history or problem list   Health Maintenance Behaviors: Annual physical exam Healing Pattern: Slow Health Facilitated by: Rest  Neurological Neurological Review of Symptoms: Weakness Neurological Self-Management Outcome: 4 (good)  HEENT HEENT Symptoms Reported: No symptoms reported HEENT Self-Management Outcome: 4 (good)    Cardiovascular Cardiovascular Symptoms Reported: No symptoms reported Does patient have uncontrolled Hypertension?: No Cardiovascular Management Strategies: Routine screening Weight: 189 lb 9.6 oz (86 kg) (patient  reported)  Respiratory Respiratory Symptoms Reported: No symptoms reported Respiratory Management Strategies: Routine screening Respiratory Self-Management Outcome: 4 (good)  Endocrine Endocrine Symptoms Reported: No symptoms reported Is patient diabetic?: Yes Is patient checking blood sugars at home?: Yes List most recent blood sugar readings, include date and time of day: 117 Endocrine Self-Management Outcome: 5 (very good)  Gastrointestinal Gastrointestinal Symptoms Reported: Constipation Gastrointestinal Management Strategies: Coping strategies, Medication therapy Gastrointestinal Self-Management Outcome: 4 (good)    Genitourinary Genitourinary Symptoms Reported: No symptoms reported Genitourinary Self-Management Outcome: 4 (good)  Integumentary Integumentary Symptoms Reported: No symptoms reported Skin Self-Management Outcome: 4 (good)  Musculoskeletal Musculoskelatal Symptoms Reviewed: Unsteady gait, Weakness Musculoskeletal Management Strategies: Medical device, Routine screening Musculoskeletal Self-Management Outcome: 3 (uncertain) Falls in the past year?: Yes Number of falls in past year: 2 or more Was there an injury with Fall?: No Fall Risk Category Calculator: 2 Patient Fall Risk Level: Moderate Fall Risk Patient at Risk for Falls Due to: History of fall(s), Impaired balance/gait Fall risk Follow up: Falls evaluation completed, Education provided  Psychosocial Psychosocial Symptoms Reported: No symptoms reported Behavioral Management Strategies: Coping strategies Behavioral Health Self-Management Outcome: 4 (good) Major Change/Loss/Stressor/Fears (CP): Denies Techniques to Cope with Loss/Stress/Change: None      08/01/2024    PHQ2-9 Depression Screening   Little interest or pleasure in doing things Not at all  Feeling down, depressed, or hopeless Not at all  PHQ-2 - Total Score 0  Trouble falling or staying asleep, or sleeping too much    Feeling tired or having  little energy    Poor appetite or overeating     Feeling bad about yourself -  or that you are a failure or have let yourself or your family down    Trouble concentrating on things, such as reading the newspaper or watching television    Moving or speaking so slowly that other people could have noticed.  Or the opposite - being so fidgety or restless that you have been moving around a lot more than usual    Thoughts that you would be better off dead, or hurting yourself in some way    PHQ2-9 Total Score    If you checked off any problems, how difficult have these problems made it for you to do your work, take care of things at home, or get along with other people    Depression Interventions/Treatment      Today's Vitals   08/01/24 1521  Weight: 189 lb 9.6 oz (86 kg)   Pain Scale: 0-10 Pain Score: 0-No pain  Medications Reviewed Today     Reviewed by Bertrum Rosina HERO, RN (Registered Nurse) on 08/01/24 at 1517  Med List Status: <None>   Medication Order Taking? Sig Documenting Provider Last Dose Status Informant  allopurinol  (ZYLOPRIM ) 300 MG tablet [id number] Yes Take 1 tablet (300 mg total) by mouth daily. McGowen, Philip H, MD  Active   amiodarone (PACERONE) 200 MG tablet [id number] Yes Take 200 mg by mouth. Take one tablet (200 mg dose) by mouth daily. Start after completion of 1 week of twice a day dose [provider]  Active   apixaban (ELIQUIS) 5 MG TABS tablet [id number] Yes Take 5 mg by mouth 2 (two) times daily. [provider]  Active   atorvastatin  (LIPITOR) 40 MG tablet [id number] Yes Take 1 tablet (40 mg total) by mouth daily. McGowen, Aleene DEL, MD  Active   Continuous Glucose Receiver (FREESTYLE LIBRE 3 READER) DEVI [id number] Yes Inject 1 each into the skin every 14 (fourteen) days. McGowen, Aleene DEL, MD  Active   Continuous Glucose Sensor (FREESTYLE LIBRE 3 PLUS SENSOR) [place] [id number] Yes Change sensor every 15 days. McGowen, Philip H, MD  Active    empagliflozin  (JARDIANCE ) 10 MG TABS tablet [id number] Yes Take 1 tablet (10 mg total) by mouth daily. McGowen, Philip H, MD  Active   furosemide (LASIX) 20 MG tablet [id number] Yes Take 20 mg by mouth 2 (two) times a week. [provider]  Active   glucose blood test strip [id number] Yes E11.9 Use to test blood sugar twice daily or as needed Pavero, Christopher, [facility]  Active   losartan  (COZAAR ) 25 MG tablet [id number] Yes Take 25 mg by mouth daily. [provider]  Active   metFORMIN  (GLUCOPHAGE ) 500 MG tablet [id number] Yes Take 1 tablet (500 mg total) by mouth 2 (two) times daily with a meal. McGowen, Aleene DEL, MD  Active   metoprolol  succinate (TOPROL -XL) 25 MG 24 hr tablet [id number] Yes Take 25 mg by mouth daily. [provider]  Active   midodrine  (PROAMATINE ) 10 MG tablet [id number]  TAKE 1 TABLET BEFORE GETTING OUT OF BED OR SOON AFTER, 1 TABLET AT 12PM, 1 TABLET AROUND 4PM  Patient not taking: Reported on 08/01/2024   Raford Riggs, MD  Consider Medication Status and Discontinue   Multiple Vitamins-Minerals (ICAPS AREDS 2 PO) [id number] Yes TAKE 1 CAPSULE BY MOUTH TWICE A DAY FOR MACULAR DEGENERATION [provider]  Active   mupirocin  ointment (BACTROBAN ) 2 % [id number] Yes Apply 1 Application topically 3 (three) times daily. Apply to sore in R nostril tid McGowen, Aleene DEL, MD  Active   nystatin  cream (MYCOSTATIN ) [id number] Yes Apply 1 Application topically 2 (two) times daily. McGowen, Philip H, MD  Active   omeprazole  (PRILOSEC) 20 MG capsule [id number] Yes TAKE 1 CAPSULE BY MOUTH EVERY DAY McGowen, Aleene DEL, MD  Active   venlafaxine  (EFFEXOR ) 75 MG tablet [id number] Yes TAKE 1 TABLET BY MOUTH EVERY DAY McGowen, Aleene DEL, MD  Active             Recommendation:   Continue Current Plan of Care  Follow Up Plan:   Telephone follow-up in 1 month  Rosina Forte, BSN RN [facility], [facility] RN Care  Manager Direct Dial: [phone]  Fax: [phone]

## 2024-08-02 ENCOUNTER — Ambulatory Visit: Admitting: Podiatry

## 2024-08-02 DIAGNOSIS — B351 Tinea unguium: Secondary | ICD-10-CM | POA: Diagnosis not present

## 2024-08-02 DIAGNOSIS — M79674 Pain in right toe(s): Secondary | ICD-10-CM | POA: Diagnosis not present

## 2024-08-02 DIAGNOSIS — M79675 Pain in left toe(s): Secondary | ICD-10-CM | POA: Diagnosis not present

## 2024-08-02 NOTE — Progress Notes (Unsigned)
 Chief Complaint  Patient presents with   [facility]    Rm11 Diabetic foot care/ blood sugar 132/

## 2024-08-03 ENCOUNTER — Other Ambulatory Visit: Payer: Self-pay | Admitting: Family Medicine

## 2024-08-03 DIAGNOSIS — F411 Generalized anxiety disorder: Secondary | ICD-10-CM

## 2024-08-04 ENCOUNTER — Other Ambulatory Visit: Payer: Self-pay | Admitting: Family Medicine

## 2024-08-04 DIAGNOSIS — E669 Obesity, unspecified: Secondary | ICD-10-CM

## 2024-08-06 ENCOUNTER — Encounter: Payer: Self-pay | Admitting: Physician Assistant

## 2024-08-06 ENCOUNTER — Ambulatory Visit: Admitting: Physician Assistant

## 2024-08-06 VITALS — BP 97/64

## 2024-08-06 DIAGNOSIS — Z1283 Encounter for screening for malignant neoplasm of skin: Secondary | ICD-10-CM

## 2024-08-06 DIAGNOSIS — D044 Carcinoma in situ of skin of scalp and neck: Secondary | ICD-10-CM

## 2024-08-06 DIAGNOSIS — L814 Other melanin hyperpigmentation: Secondary | ICD-10-CM

## 2024-08-06 DIAGNOSIS — D1801 Hemangioma of skin and subcutaneous tissue: Secondary | ICD-10-CM | POA: Diagnosis not present

## 2024-08-06 DIAGNOSIS — L578 Other skin changes due to chronic exposure to nonionizing radiation: Secondary | ICD-10-CM

## 2024-08-06 DIAGNOSIS — Z85828 Personal history of other malignant neoplasm of skin: Secondary | ICD-10-CM

## 2024-08-06 DIAGNOSIS — C44319 Basal cell carcinoma of skin of other parts of face: Secondary | ICD-10-CM

## 2024-08-06 DIAGNOSIS — W908XXA Exposure to other nonionizing radiation, initial encounter: Secondary | ICD-10-CM

## 2024-08-06 DIAGNOSIS — L821 Other seborrheic keratosis: Secondary | ICD-10-CM

## 2024-08-06 DIAGNOSIS — C4491 Basal cell carcinoma of skin, unspecified: Secondary | ICD-10-CM

## 2024-08-06 DIAGNOSIS — D229 Melanocytic nevi, unspecified: Secondary | ICD-10-CM

## 2024-08-06 DIAGNOSIS — D099 Carcinoma in situ, unspecified: Secondary | ICD-10-CM

## 2024-08-06 DIAGNOSIS — D485 Neoplasm of uncertain behavior of skin: Secondary | ICD-10-CM | POA: Diagnosis not present

## 2024-08-06 DIAGNOSIS — Z8589 Personal history of malignant neoplasm of other organs and systems: Secondary | ICD-10-CM

## 2024-08-06 HISTORY — DX: Basal cell carcinoma of skin, unspecified: C44.91

## 2024-08-06 HISTORY — DX: Carcinoma in situ, unspecified: D09.9

## 2024-08-06 NOTE — Patient Instructions (Addendum)

## 2024-08-06 NOTE — Progress Notes (Signed)
 "  New Patient Visit   Subjective  Jason [name] is a 88 y.o. male NEW PATIENT who presents for the following: Skin Cancer Screening and Full Body Skin Exam - History of SCC and BCC  The patient presents for Total-Body Skin Exam (TBSE) for skin cancer screening and mole check. The patient has spots, moles and lesions to be evaluated, some may be new or changing and the patient may have concern these could be cancer.    The following portions of the chart were reviewed this encounter and updated as appropriate: medications, allergies, medical history  Review of Systems:  No other skin or systemic complaints except as noted in HPI or Assessment and Plan.  Objective  Well appearing patient in no apparent distress; mood and affect are within normal limits.  A full examination was performed including scalp, head, eyes, ears, nose, lips, neck, chest, axillae, abdomen, back, buttocks, bilateral upper extremities, bilateral lower extremities, hands, feet, fingers, toes, fingernails, and toenails. All findings within normal limits unless otherwise noted below.   Relevant physical exam findings are noted in the Assessment and Plan.  Right neck 1.2 cm scaly erythematous plaque  Left Upper Cutaneous Lip 0.6 cm pearly papule   Assessment & Plan   SKIN CANCER SCREENING PERFORMED TODAY.  ACTINIC DAMAGE - Chronic condition, secondary to cumulative UV/sun exposure - diffuse scaly erythematous macules with underlying dyspigmentation - Recommend daily broad spectrum sunscreen SPF 30+ to sun-exposed areas, reapply every 2 hours as needed.  - Staying in the shade or wearing long sleeves, sun glasses (UVA+UVB protection) and wide brim hats (4-inch brim around the entire circumference of the hat) are also recommended for sun protection.  - Call for new or changing lesions.  LENTIGINES, SEBORRHEIC KERATOSES, HEMANGIOMAS - Benign normal skin lesions - Benign-appearing - Call for any  changes  MELANOCYTIC NEVI - Tan-brown and/or pink-flesh-colored symmetric macules and papules - Benign appearing on exam today - Observation - Call clinic for new or changing moles - Recommend daily use of broad spectrum spf 30+ sunscreen to sun-exposed areas.   HISTORY OF SQUAMOUS CELL CARCINOMA OF THE SKIN - No evidence of recurrence today - No lymphadenopathy - Recommend regular full body skin exams - Recommend daily broad spectrum sunscreen SPF 30+ to sun-exposed areas, reapply every 2 hours as needed.  - Call if any new or changing lesions are noted between office visits   HISTORY OF BASAL CELL CARCINOMA OF THE SKIN - No evidence of recurrence today - Recommend regular full body skin exams - Recommend daily broad spectrum sunscreen SPF 30+ to sun-exposed areas, reapply every 2 hours as needed.  - Call if any new or changing lesions are noted between office visits      NEOPLASM OF UNCERTAIN BEHAVIOR OF SKIN (2) Right neck - Skin / nail biopsy Type of biopsy: tangential   Informed consent: discussed and consent obtained   Timeout: patient name, date of birth, surgical site, and procedure verified   Procedure prep:  Patient was prepped and draped in usual sterile fashion Prep type:  Isopropyl alcohol Anesthesia: the lesion was anesthetized in a standard fashion   Anesthetic:  1% lidocaine w/ epinephrine 1-100,000 buffered w/ 8.4% NaHCO3 Instrument used: flexible razor blade   Hemostasis achieved with: pressure, aluminum chloride and electrodesiccation   Outcome: patient tolerated procedure well   Post-procedure details: sterile dressing applied and wound care instructions given   Dressing type: bandage and petrolatum    Specimen 1 - Surgical  pathology Differential Diagnosis: LSC vs SCC vs other   Check Margins: No Left Upper Cutaneous Lip - Skin / nail biopsy Type of biopsy: tangential   Informed consent: discussed and consent obtained   Timeout: patient name,  date of birth, surgical site, and procedure verified   Procedure prep:  Patient was prepped and draped in usual sterile fashion Prep type:  Isopropyl alcohol Anesthesia: the lesion was anesthetized in a standard fashion   Anesthetic:  1% lidocaine w/ epinephrine 1-100,000 buffered w/ 8.4% NaHCO3 Instrument used: flexible razor blade   Hemostasis achieved with: pressure, aluminum chloride and electrodesiccation   Outcome: patient tolerated procedure well   Post-procedure details: sterile dressing applied and wound care instructions given   Dressing type: bandage and petrolatum    Specimen 2 - Surgical pathology Differential Diagnosis: BCC vs other  Check Margins: No ACTINIC SKIN DAMAGE   CHERRY ANGIOMA   LENTIGINES   MULTIPLE BENIGN NEVI   SCREENING EXAM FOR SKIN CANCER   HISTORY OF SQUAMOUS CELL CARCINOMA   HISTORY OF BASAL CELL CANCER    Return in about 6 months (around 02/04/2025) for TBSE.  I, Roseline Hutchinson, CMA, am acting as scribe for Anhar Mcdermott K, PA-C .   Documentation: I have reviewed the above documentation for accuracy and completeness, and I agree with the above.  Kailyn Dubie K, PA-C    "

## 2024-08-08 LAB — SURGICAL PATHOLOGY

## 2024-08-13 ENCOUNTER — Ambulatory Visit: Payer: Self-pay | Admitting: Physician Assistant

## 2024-08-15 ENCOUNTER — Telehealth: Payer: Self-pay

## 2024-08-15 NOTE — Telephone Encounter (Signed)
 Received fax from [facility] Certification and POC 08/18/24 to 10/16/24  Order # [id number]  McGowen front office inbox

## 2024-08-15 NOTE — Telephone Encounter (Signed)
 Home health orders received 08/15/24 for [facility] health initiation orders: Yes.  Home health re-certification orders: No. Patient last seen by ordering physician for this condition: 07/17/24. Must be less than 90 days for re-certification and less than 30 days prior for initiation. Visit must have been for the condition the orders are being placed.  Patient meets criteria for Physician to sign orders: Yes.        Current med list has been attached: Yes        Orders placed on physicians desk for signature: 08/15/24 (date) If patient does not meet criteria for orders to be signed: pt was called to schedule appt. Appt is scheduled for 08/17/24.   Placed on PCP desk to review and sign, if appropriate.   Kamela Blansett D Daejon Lich

## 2024-08-16 NOTE — Progress Notes (Unsigned)
 OFFICE VISIT  08/17/2024  CC:  Chief Complaint  Patient presents with   Medical Management of Chronic Issues    Pt would like to discuss taking magnesium supplements at night    Patient is a 89 y.o. male who presents for 1 month follow-up of A-fib, heart failure, chronic renal insufficiency, and debilitation. A/P as of last visit: 1 new onset atrial fibrillation with rapid ventricular response. Rate is well-controlled on 25 mg Toprol -XL daily. No problems with Eliquis 5 mg twice daily. We have discussed in detail the potential benefits and risks of this medication, particularly in a patient who has a high risk of falls. Cardiologist is loading amiodarone and there is plan for DCCV on 07/24/2024.   #2  Acute systolic CHF.  EF after discharge from the hospital is down to 30 to 35%. Currently with no signs or symptoms of volume overload, although he has gained some weight back--> weight at home lately has been around 194 to 196 pounds.   Cardiology suspects that his impaired EF is likely related to recent A-fib with rapid ventricular response. He will be on a schedule of Lasix 20 mg on Tuesdays and Thursdays. Losartan  25 mg started yesterday by cardiology. He will continue Toprol -XL 25 mg a day. Continue Jardiance  10 mg daily.   #3 diabetes without complication, well-controlled. Hemoglobin A1c 6.8% in the hospital approx 1 mo ago. He recently saw his endocrinologist for follow-up: He will remain off of his Mounjaro  for about 6 months (restart approx April/may 2026). He remains on metformin  500 mg twice a day and Jardiance  10 mg a day.   #4 chronic renal insufficiency stage IIIa. Serum creatinine 1.37, GFR 50 at last check about 5 wks ago. Electrolytes were normal at that time. Plan repeat labs at next follow-up in 1 month.   5.  Debilitated patient, unsteady gait, history of recurrent falls. See #1 above regarding discussion about risks of anticoagulation in this setting. He is  improving. He just started home health PT.   #6 balanitis. He will be at risk for recurrence of this.  Nystatin  cream prescribed today, apply twice daily.  INTERIM HX: He is gradually feeling better.  He was in sinus rhythm the morning of his scheduled cardioversion (07/24/2024) so the procedure was canceled.  He was continued on Eliquis 5 mg twice a day and amiodarone 200 mg a day.  Denies any palpitations, chest pain, or lower extremity swelling.  Has had some trouble sleeping and has been taking Tylenol PM. He understands the Benadryl may not be good for him (it has caused hangover effect) so he asks about taking magnesium supplement at night.  Past Medical History:  Diagnosis Date   Anxiety and depression    Ascending aortic aneurysm 01/22/2022   Atrial fibrillation, new onset (HCC)    05/2024   Basal cell carcinoma (BCC) 08/06/2024   Left upper cutaneous lip - Needs Mohs   Bilateral shoulder pain, unspecified chronicity    XR 10/2023-->+Bilat RC tendonopathy+AC arth and mild GH arth on R.  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  05/2024 (new onset a-fib): EF 30-35 LV hypokin, mod MR    Outpatient Medications Prior to Visit  Medication Sig Dispense Refill   allopurinol  (ZYLOPRIM ) 300 MG tablet Take 1 tablet (300 mg total) by mouth daily. 90 tablet 3   amiodarone (PACERONE) 200 MG tablet Take 200 mg by mouth. Take one tablet (200 mg dose) by mouth daily. Start after completion of 1 week of twice a day dose     apixaban (ELIQUIS) 5 MG TABS tablet Take 5 mg by mouth 2 (two) times daily.     atorvastatin  (LIPITOR) 40 MG tablet Take 1 tablet (40 mg total) by mouth daily. 90 tablet 3   Continuous Glucose Receiver (FREESTYLE LIBRE 3 READER) DEVI Inject 1 each into the skin every 14 (fourteen) days. 2 each 2   Continuous Glucose Sensor (FREESTYLE LIBRE 3 PLUS SENSOR) MISC Change sensor every 15 days. 2 each 6   empagliflozin  (JARDIANCE ) 10 MG TABS tablet Take 1 tablet (10 mg total) by mouth daily. 90 tablet 1   furosemide (LASIX) 20 MG tablet Take 20 mg by mouth 2 (two) times a week.     glucose blood test strip E11.9 Use to test blood sugar twice daily or as needed 100 each 12   losartan  (COZAAR ) 25 MG tablet Take 25 mg by mouth daily.     metFORMIN  (GLUCOPHAGE ) 500 MG tablet Take 1 tablet (500 mg total) by mouth 2 (two) times daily with a meal. 90 tablet 1   metoprolol  succinate (TOPROL -XL) 25 MG 24 hr tablet Take 25 mg by mouth daily.     Multiple Vitamins-Minerals (ICAPS AREDS 2 PO) TAKE 1 CAPSULE BY MOUTH TWICE A DAY FOR MACULAR DEGENERATION     mupirocin  ointment (BACTROBAN ) 2 % Apply 1 Application topically 3 (three) times daily. Apply to sore in R nostril tid 15 g 0   nystatin  cream (MYCOSTATIN ) Apply 1 Application topically 2 (two) times daily. 60 g 3   omeprazole  (PRILOSEC) 20 MG capsule TAKE 1 CAPSULE BY MOUTH EVERY DAY 90 capsule 1    venlafaxine  (EFFEXOR ) 75 MG tablet TAKE 1 TABLET BY MOUTH EVERY DAY 90 tablet 0   midodrine  (PROAMATINE ) 10 MG tablet TAKE 1 TABLET BEFORE GETTING OUT OF BED OR SOON AFTER, 1 TABLET AT 12PM, 1 TABLET AROUND 4PM (Patient not taking: Reported on 08/17/2024) 270 tablet 2   No facility-administered medications prior to visit.    Allergies[1]  Review of Systems As per HPI  PE:    08/17/2024   10:02 AM 08/06/2024   11:10 AM 08/01/2024    3:21 PM  Vitals with BMI  Weight 191 lbs 6 oz  189 lbs 10 oz  Systolic 125 97   Diastolic 74 64   Pulse 81       Physical Exam  Gen: Alert, well appearing.  Patient is oriented to person, place, time, and situation. AFFECT: pleasant, lucid thought and speech. CV: RRR, no m/r/g Chest is clear, no wheezing or rales. Normal symmetric air entry throughout both lung fields. No chest wall deformities or tenderness. EXT: no edema  LABS:  Last CBC Lab Results  Component Value Date   WBC 7.4 06/12/2024   HGB 13.8 06/12/2024   HCT 43.0 06/12/2024   MCV 92.9 06/12/2024   MCH 29.8 06/12/2024   RDW 13.4 06/12/2024   PLT 255 06/12/2024   Last metabolic panel Lab Results  Component Value Date   GLUCOSE 174 (H) 06/12/2024   NA 137 06/12/2024   K 4.2 06/12/2024   CL 92 (L) 06/12/2024   CO2 28 06/12/2024   BUN 40 (H) 06/12/2024   CREATININE 1.37 (H) 06/12/2024   EGFR 50 (L) 06/12/2024   CALCIUM  9.4 06/12/2024   PROT 6.7 04/17/2024   ALBUMIN 4.5 10/17/2023   BILITOT 0.7 04/17/2024   ALKPHOS 94 10/17/2023   AST 21 04/17/2024   ALT 15 04/17/2024   Lab Results  Component Value Date   TSH 2.12 10/17/2023   Lab Results  Component Value Date   CHOL 144 04/17/2024   HDL 42 04/17/2024   LDLCALC 81 04/17/2024   TRIG 109 04/17/2024   CHOLHDL 3.4 04/17/2024   IMPRESSION AND PLAN: #1 A-fib w/RVR--->he has spontaneously converted back to sinus rhythm. For now he will cont amiodarone 200 mg every day, 25 mg Toprol -XL daily and Eliquis 5 mg twice  daily. We have discussed in detail the potential benefits and risks of anticoag medication, particularly in a patient who has a high risk of falls. He has follow-up with his cardiologist on 09/18/2023. Check BMET and TSH today.  #2  Acute systolic CHF.  EF after discharge from the hospital was down to 30 to 35%. Currently with no signs or symptoms of volume overload. Cardiology suspects that his impaired EF is likely related to recent A-fib with rapid ventricular response. He will be on a schedule of Lasix 20 mg on Tuesdays and Thursdays. Continue Losartan  25 mg and Toprol -XL 25 mg a day. Continue Jardiance  10 mg daily.   #3 diabetes without complication, well-controlled. Hemoglobin A1c 6.8% in the hospital approx 2 mo ago. Per endocrinologist he will remain off of his Mounjaro  for about 6 months (restart approx April/may 2026). He remains on metformin  500 mg twice a day and Jardiance  10 mg a day.   #4 chronic renal insufficiency stage IIIa. Serum creatinine 1.37/GFR 50 and electrolytes normal at last check about 9 wks ago. BMET today.   5.  Debilitated patient, unsteady gait, history of recurrent falls. See #1 above regarding discussion about risks of anticoagulation in this setting. He is improving. His HH PT extension is in the process of approval.  #6 insomnia. He will work on sleep hygiene.  He will avoid Benadryl. He can take 500 mg of magnesium oxide at nighttime as long as his back level is normal today.  An After Visit Summary was printed and given to the patient.  FOLLOW UP: Return in about 2 months (around 10/15/2024) for routine chronic illness f/u.  Signed:  Gerlene Hockey, MD           08/17/2024      [1]  Allergies Allergen Reactions  Lisinopril Cough, Swelling and Other (See Comments)

## 2024-08-17 ENCOUNTER — Encounter: Payer: Self-pay | Admitting: Family Medicine

## 2024-08-17 ENCOUNTER — Ambulatory Visit: Admitting: Family Medicine

## 2024-08-17 VITALS — BP 125/74 | HR 81 | Temp 97.5°F | Wt 191.4 lb

## 2024-08-17 DIAGNOSIS — I4891 Unspecified atrial fibrillation: Secondary | ICD-10-CM | POA: Diagnosis not present

## 2024-08-17 DIAGNOSIS — I130 Hypertensive heart and chronic kidney disease with heart failure and stage 1 through stage 4 chronic kidney disease, or unspecified chronic kidney disease: Secondary | ICD-10-CM | POA: Diagnosis not present

## 2024-08-17 DIAGNOSIS — Z7984 Long term (current) use of oral hypoglycemic drugs: Secondary | ICD-10-CM | POA: Diagnosis not present

## 2024-08-17 DIAGNOSIS — Z79899 Other long term (current) drug therapy: Secondary | ICD-10-CM

## 2024-08-17 DIAGNOSIS — I5022 Chronic systolic (congestive) heart failure: Secondary | ICD-10-CM | POA: Diagnosis not present

## 2024-08-17 DIAGNOSIS — E119 Type 2 diabetes mellitus without complications: Secondary | ICD-10-CM | POA: Diagnosis not present

## 2024-08-17 DIAGNOSIS — M1030 Gout due to renal impairment, unspecified site: Secondary | ICD-10-CM | POA: Diagnosis not present

## 2024-08-17 DIAGNOSIS — N2889 Other specified disorders of kidney and ureter: Secondary | ICD-10-CM | POA: Diagnosis not present

## 2024-08-17 DIAGNOSIS — I5021 Acute systolic (congestive) heart failure: Secondary | ICD-10-CM | POA: Diagnosis not present

## 2024-08-17 DIAGNOSIS — I480 Paroxysmal atrial fibrillation: Secondary | ICD-10-CM | POA: Diagnosis not present

## 2024-08-17 DIAGNOSIS — F32A Depression, unspecified: Secondary | ICD-10-CM | POA: Diagnosis not present

## 2024-08-17 DIAGNOSIS — Z7901 Long term (current) use of anticoagulants: Secondary | ICD-10-CM | POA: Diagnosis not present

## 2024-08-17 DIAGNOSIS — I081 Rheumatic disorders of both mitral and tricuspid valves: Secondary | ICD-10-CM | POA: Diagnosis not present

## 2024-08-17 DIAGNOSIS — N1831 Chronic kidney disease, stage 3a: Secondary | ICD-10-CM | POA: Diagnosis not present

## 2024-08-17 DIAGNOSIS — I7121 Aneurysm of the ascending aorta, without rupture: Secondary | ICD-10-CM | POA: Diagnosis not present

## 2024-08-17 DIAGNOSIS — I712 Thoracic aortic aneurysm, without rupture, unspecified: Secondary | ICD-10-CM | POA: Diagnosis not present

## 2024-08-17 DIAGNOSIS — F5101 Primary insomnia: Secondary | ICD-10-CM

## 2024-08-17 DIAGNOSIS — E1143 Type 2 diabetes mellitus with diabetic autonomic (poly)neuropathy: Secondary | ICD-10-CM

## 2024-08-17 DIAGNOSIS — I951 Orthostatic hypotension: Secondary | ICD-10-CM | POA: Diagnosis not present

## 2024-08-17 DIAGNOSIS — E1122 Type 2 diabetes mellitus with diabetic chronic kidney disease: Secondary | ICD-10-CM | POA: Diagnosis not present

## 2024-08-17 DIAGNOSIS — I2510 Atherosclerotic heart disease of native coronary artery without angina pectoris: Secondary | ICD-10-CM | POA: Diagnosis not present

## 2024-08-17 LAB — BASIC METABOLIC PANEL WITH GFR
BUN: 23 mg/dL (ref 6–23)
CO2: 27 meq/L (ref 19–32)
Calcium: 9 mg/dL (ref 8.4–10.5)
Chloride: 99 meq/L (ref 96–112)
Creatinine, Ser: 1.02 mg/dL (ref 0.40–1.50)
GFR: 66.49 mL/min
Glucose, Bld: 115 mg/dL — ABNORMAL HIGH (ref 70–99)
Potassium: 4.5 meq/L (ref 3.5–5.1)
Sodium: 137 meq/L (ref 135–145)

## 2024-08-17 LAB — TSH: TSH: 3.41 u[IU]/mL (ref 0.35–5.50)

## 2024-08-17 LAB — MAGNESIUM: Magnesium: 1.9 mg/dL (ref 1.5–2.5)

## 2024-08-17 NOTE — Patient Instructions (Signed)
 Magnesium oxide 500mg  tab at bedtime.

## 2024-08-18 ENCOUNTER — Ambulatory Visit: Payer: Self-pay | Admitting: Family Medicine

## 2024-08-28 ENCOUNTER — Telehealth: Payer: Self-pay

## 2024-08-28 MED ORDER — METOPROLOL SUCCINATE ER 25 MG PO TB24: 25.0000 mg | ORAL_TABLET | Freq: Every day | ORAL | 0 refills | Status: AC

## 2024-08-28 NOTE — Telephone Encounter (Signed)
 Yes, continue 25mg  toprol  xl daily. RF sent.

## 2024-08-28 NOTE — Telephone Encounter (Signed)
 Pt wanted to know if he needs to continue with taking Metoprolol , if so he needs a refill. His next appt is scheduled for 3/4. Last OV note states 25mg  Toprol -XL daily for now.  Please fill, if appropriate.

## 2024-08-28 NOTE — Addendum Note (Signed)
 Addended by: CANDISE ALEENE DEL on: 08/28/2024 07:37 PM   Modules accepted: Orders

## 2024-08-29 NOTE — Telephone Encounter (Signed)
 Pt advised of provider recommendations and refill available at the pharmacy.

## 2024-08-31 ENCOUNTER — Other Ambulatory Visit: Payer: Self-pay | Admitting: *Deleted

## 2024-08-31 NOTE — Patient Outreach (Signed)
 Complex Care Management   Visit Note  08/31/2024  Name:  Jason [name] MRN: [id number] DOB: Apr 02, 1936  Situation: Referral received for Complex Care Management related to Heart Failure, Diabetes with Complications, and Atrial Fibrillation I obtained verbal consent from Patient.  Visit completed with Patient  on the phone  Background:   Past Medical History:  Diagnosis Date   Anxiety and depression    Ascending aortic aneurysm 01/22/2022   Atrial fibrillation, new onset (HCC)    05/2024   Basal cell carcinoma (BCC) 08/06/2024   Left upper cutaneous lip - Needs Mohs   Bilateral shoulder pain, unspecified chronicity    XR 10/2023-->+Bilat RC tendonopathy+AC arth and mild GH arth on R.  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   Right neck - needs Mohs   Urinary incontinence     Assessment: Patient Reported Symptoms:  Cognitive Cognitive Status: No symptoms reported Cognitive/Intellectual Conditions Management [RPT]: None reported or documented in medical history or problem list   Health Maintenance Behaviors: Annual physical exam Healing Pattern: Slow Health Facilitated by: Rest  Neurological Neurological Review of Symptoms: No symptoms reported Neurological Management Strategies: Routine screening Neurological Self-Management Outcome: 4 (good)  HEENT HEENT Symptoms Reported: No symptoms reported HEENT  Management Strategies: Routine screening HEENT Self-Management Outcome: 5 (very good)    Cardiovascular Cardiovascular Symptoms Reported: No symptoms reported Does patient have uncontrolled Hypertension?: No Cardiovascular Management Strategies: Routine screening, Weight management Do You Have a Working Readable Scale?: Yes Weight: 188 lb 9.6 oz (85.5 kg) Cardiovascular Self-Management Outcome: 5 (very good)  Respiratory Respiratory Symptoms Reported: No symptoms reported Respiratory Management Strategies: Routine screening Respiratory Self-Management Outcome: 4 (good)  Endocrine Endocrine Symptoms Reported: No symptoms reported Is patient diabetic?: Yes Is patient checking blood sugars at home?: Yes List most recent blood sugar readings, include date and time of day: 112 Endocrine Self-Management Outcome: 4 (good)  Gastrointestinal Gastrointestinal Symptoms Reported: Constipation Gastrointestinal Management Strategies: Coping strategies Gastrointestinal Self-Management Outcome: 4 (good)    Genitourinary Genitourinary Symptoms Reported: No symptoms reported Genitourinary Self-Management Outcome: 4 (good)  Integumentary Integumentary Symptoms Reported: Not assessed    Musculoskeletal Musculoskelatal Symptoms Reviewed: Unsteady gait, Weakness, Difficulty walking Musculoskeletal Management Strategies: Medical device Musculoskeletal Self-Management Outcome: 4 (good) Falls in the past year?: Yes Number of falls in past year: 2 or more Was there an injury with Fall?: No Fall Risk Category Calculator: 2 Patient Fall Risk Level: Moderate Fall Risk Patient at Risk for Falls Due to: History of fall(s), Impaired balance/gait, Impaired mobility Fall risk Follow up: Falls evaluation completed, Education provided  Psychosocial Psychosocial Symptoms Reported: No symptoms reported Behavioral Management Strategies: Coping strategies Behavioral Health Self-Management Outcome: 4 (good) Major  Change/Loss/Stressor/Fears (CP): Denies Techniques to Cope with Loss/Stress/Change: None      08/31/2024    PHQ2-9 Depression Screening   Little interest  or pleasure in doing things Not at all  Feeling down, depressed, or hopeless Not at all  PHQ-2 - Total Score 0  Trouble falling or staying asleep, or sleeping too much    Feeling tired or having little energy    Poor appetite or overeating     Feeling bad about yourself - or that you are a failure or have let yourself or your family down    Trouble concentrating on things, such as reading the newspaper or watching television    Moving or speaking so slowly that other people could have noticed.  Or the opposite - being so fidgety or restless that you have been moving around a lot more than usual    Thoughts that you would be better off dead, or hurting yourself in some way    PHQ2-9 Total Score    If you checked off any problems, how difficult have these problems made it for you to do your work, take care of things at home, or get along with other people    Depression Interventions/Treatment      Today's Vitals   08/31/24 1008  BP: 132/79  Pulse: 75  Weight: 188 lb 9.6 oz (85.5 kg)   Pain Scale: 0-10 Pain Score: 0-No pain  Medications Reviewed Today     Reviewed by Bertrum Rosina HERO, RN (Registered Nurse) on 08/31/24 at 1005  Med List Status: <None>   Medication Order Taking? Sig Documenting Provider Last Dose Status Informant  allopurinol  (ZYLOPRIM ) 300 MG tablet [id number]  Take 1 tablet (300 mg total) by mouth daily. McGowen, Philip H, MD  Active   amiodarone (PACERONE) 200 MG tablet [id number]  Take 200 mg by mouth. Take one tablet (200 mg dose) by mouth daily. Start after completion of 1 week of twice a day dose [provider]  Active   apixaban (ELIQUIS) 5 MG TABS tablet [id number]  Take 5 mg by mouth 2 (two) times daily. [provider]  Active   atorvastatin  (LIPITOR) 40 MG tablet [id number]  Take 1 tablet  (40 mg total) by mouth daily. McGowen, Aleene DEL, MD  Active   Continuous Glucose Receiver (FREESTYLE LIBRE 3 READER) DEVI [id number]  Inject 1 each into the skin every 14 (fourteen) days. McGowen, Aleene DEL, MD  Active   Continuous Glucose Sensor (FREESTYLE LIBRE 3 PLUS SENSOR) MISC [id number]  Change sensor every 15 days. McGowen, Philip H, MD  Active   empagliflozin  (JARDIANCE ) 10 MG TABS tablet [id number]  Take 1 tablet (10 mg total) by mouth daily. McGowen, Philip H, MD  Active   furosemide (LASIX) 20 MG tablet [id number]  Take 20 mg by mouth 2 (two) times a week. [provider]  Active   glucose blood test strip [id number]  E11.9 Use to test blood sugar twice daily or as needed Pavero, Christopher, [facility]  Active   losartan  (COZAAR ) 25 MG tablet [id number]  Take 25 mg by mouth daily. [provider]  Active   metFORMIN  (GLUCOPHAGE ) 500 MG tablet [id number]  Take 1 tablet (500 mg total) by mouth 2 (two) times daily with a meal. McGowen, Aleene DEL, MD  Active   metoprolol  succinate (TOPROL -XL) 25 MG 24 hr tablet [id number]  Take 1 tablet (25 mg total) by mouth daily. McGowen, Philip H, MD  Active   midodrine  (PROAMATINE ) 10 MG tablet [id number]  TAKE 1 TABLET BEFORE GETTING OUT OF BED OR SOON AFTER, 1 TABLET AT 12PM, 1 TABLET AROUND 4PM  Patient not taking:  Reported on 08/17/2024   Raford Riggs, MD  Active   Multiple Vitamins-Minerals (ICAPS AREDS 2 PO) [id number]  TAKE 1 CAPSULE BY MOUTH TWICE A DAY FOR MACULAR DEGENERATION [provider]  Active   mupirocin  ointment (BACTROBAN ) 2 % [id number]  Apply 1 Application topically 3 (three) times daily. Apply to sore in R nostril tid McGowen, Philip H, MD  Active   nystatin  cream (MYCOSTATIN ) [id number]  Apply 1 Application topically 2 (two) times daily. McGowen, Philip H, MD  Active   omeprazole  (PRILOSEC) 20 MG capsule [id number]  TAKE 1 CAPSULE BY MOUTH EVERY DAY McGowen, Aleene DEL, MD  Active   venlafaxine  (EFFEXOR ) 75 MG tablet  [id number]  TAKE 1 TABLET BY MOUTH EVERY DAY McGowen, Aleene DEL, MD  Active             Recommendation:   Continue Current Plan of Care  Follow Up Plan:   Telephone follow-up in 1 month  Rosina Forte, BSN RN [facility], [facility] RN Care Manager Direct Dial: [phone]  Fax: [phone]

## 2024-08-31 NOTE — Patient Instructions (Signed)
 Visit Information  Thank you for taking time to visit with me today. Please don't hesitate to contact me if I can be of assistance to you before our next scheduled appointment.  Your next care management appointment is by telephone on 10-02-2024 at 10:00 am  Telephone follow-up in 1 month  Please call the care guide team at [phone] if you need to cancel, schedule, or reschedule an appointment.   Please call the Suicide and Crisis Lifeline: 988 call the [place]  National Suicide Prevention Lifeline: 715-724-2651 or TTY: 5796580371 TTY 815-857-7836) to talk to a trained counselor call 1-800-273-TALK (toll free, 24 hour hotline) if you are experiencing a Mental Health or Behavioral Health Crisis or need someone to talk to.  Rosina Forte, BSN RN [facility], [facility] RN Care Manager Direct Dial: [phone]  Fax: [phone]

## 2024-09-03 ENCOUNTER — Encounter: Payer: Self-pay | Admitting: Dermatology

## 2024-09-04 ENCOUNTER — Telehealth: Payer: Self-pay

## 2024-09-04 ENCOUNTER — Other Ambulatory Visit (HOSPITAL_COMMUNITY): Payer: Self-pay

## 2024-09-04 NOTE — Telephone Encounter (Signed)
 Pharmacy Patient Advocate Encounter   Received notification from Pt Calls Messages that prior authorization for Freestyle sensor libre 3 plus is required/requested.   Insurance verification completed.   The patient is insured through [facility].   Per test claim: PA required; PA submitted to above mentioned insurance via Phone Key/confirmation #/EOC [id number] Status is pending

## 2024-09-04 NOTE — Telephone Encounter (Signed)
 Jason [name]

## 2024-09-04 NOTE — Telephone Encounter (Signed)
 PA request has been Submitted. New Encounter has been or will be created for follow up. For additional info see Pharmacy Prior Auth telephone encounter from 09/04/2024.

## 2024-09-06 ENCOUNTER — Encounter: Payer: Self-pay | Admitting: Dermatology

## 2024-09-06 ENCOUNTER — Ambulatory Visit: Admitting: Dermatology

## 2024-09-06 VITALS — BP 100/63 | HR 78 | Temp 97.9°F

## 2024-09-06 DIAGNOSIS — C44319 Basal cell carcinoma of skin of other parts of face: Secondary | ICD-10-CM | POA: Diagnosis not present

## 2024-09-06 DIAGNOSIS — L578 Other skin changes due to chronic exposure to nonionizing radiation: Secondary | ICD-10-CM

## 2024-09-06 DIAGNOSIS — L814 Other melanin hyperpigmentation: Secondary | ICD-10-CM

## 2024-09-06 DIAGNOSIS — C4491 Basal cell carcinoma of skin, unspecified: Secondary | ICD-10-CM

## 2024-09-06 NOTE — Progress Notes (Signed)
 "  Follow-Up Visit   Subjective  Jason [name] is a 89 y.o. 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  The following portions of the chart were reviewed this encounter and updated as appropriate: medications, allergies, medical history  Review of Systems:  No other skin or systemic complaints except as noted in HPI or Assessment and Plan.  Objective  Well appearing patient in no apparent distress; mood and affect are within normal limits.  A focused examination was performed of the following areas: Left upper cutaneous lip Relevant physical exam findings are noted in the Assessment and Plan.   Left Upper Cutaneous Lip Pink nodule   Assessment & Plan   BASAL CELL CARCINOMA (BCC), UNSPECIFIED SITE Left Upper Cutaneous Lip - Mohs surgery  Consent obtained: written  Anticoagulation: Is the patient taking prescription anticoagulant and/or aspirin prescribed/recommended by a physician? Yes   Was the anticoagulation regimen changed prior to Mohs? No    Anesthesia: Anesthesia method: local infiltration Local anesthetic: lidocaine 1% WITH epi  Procedure Details: Timeout: pre-procedure verification complete Procedure Prep: patient was prepped and draped in usual sterile fashion Prep type: chlorhexidine Biopsy accession number: [id number] Pre-Op diagnosis: basal cell carcinoma BCC subtype: nodular Surgical site (from skin exam): Left Upper Cutaneous Lip Pre-operative length (cm): 0.8 Pre-operative width (cm): 0.5 Indications for Mohs surgery: anatomic location where tissue conservation is critical  Micrographic Surgery Details: Post-operative length (cm): 1.2 Post-operative width (cm): 1 Number of Mohs stages: 1 Post surgery depth of defect: subcutaneous fat  Stage 1    Tumor features identified on Mohs section: no tumor identified  Reconstruction: Was the defect  reconstructed? Yes   Was reconstruction performed by the same Mohs surgeon? Yes   Setting of reconstruction: outpatient office When was reconstruction performed? same day Type of reconstruction: linear Linear reconstruction: complex  - Skin repair Complexity:  Complex Final length (cm):  3 Timeout: patient name, date of birth, surgical site, and procedure verified   Procedure prep:  Patient was prepped and draped in usual sterile fashion Prep type:  Chlorhexidine Anesthesia: the lesion was anesthetized in a standard fashion   Anesthetic:  1% lidocaine w/ epinephrine 1-100,000 buffered w/ 8.4% NaHCO3 Reason for type of repair: reduce tension to allow closure, allow closure of the large defect and preserve normal anatomy   Undermining: area extensively undermined   Subcutaneous layers (deep stitches):  Suture size:  5-0 Suture type: Monocryl (poliglecaprone 25)   Stitches:  Buried vertical mattress Fine/surface layer approximation (top stitches):  Suture size:  6-0 Suture type: fast-absorbing plain gut   Stitches: simple running   Hemostasis achieved with: suture, pressure and electrodesiccation Outcome: patient tolerated procedure well with no complications   Post-procedure details: sterile dressing applied and wound care instructions given   Dressing type: bandage and pressure dressing      Return in about 4 weeks (around 10/04/2024) for wound check.  LILLETTE Darice Smock, CMA, am acting as scribe for [name], MD.    09/06/2024  HISTORY OF PRESENT ILLNESS  Jason [name] is seen in consultation at the request of Erminio Like, PA-C for biopsy-proven Nodular Basal Cell Carcinoma of the left upper cutaneous lip. They note that the area has been present for about 6 months increasing in size with time.  There is no history of previous treatment.  Reports no other new or changing lesions and has  no other complaints today.  Medications and allergies: see patient chart.  Review  of systems: Reviewed 8 systems and notable for the above skin cancer.  All other systems reviewed are unremarkable/negative, unless noted in the HPI. Past medical history, surgical history, family history, social history were also reviewed and are noted in the chart/questionnaire.    PHYSICAL EXAMINATION  General: Well-appearing, in no acute distress, alert and oriented x 4. Vitals reviewed in chart (if available).   Skin: Exam reveals a 0.8 x 0.5 cm erythematous papule and biopsy scar on the left upper cutaneous lip. There are rhytids, telangiectasias, and lentigines, consistent with photodamage.  Biopsy report(s) reviewed, confirming the diagnosis.   ASSESSMENT  1) Nodular Basal Cell Carcinoma of the left upper cutaneous lip 2) photodamage 3) solar lentigines   PLAN   1. Due to location, size, histology, or recurrence and the likelihood of subclinical extension as well as the need to conserve normal surrounding tissue, the patient was deemed acceptable for Mohs micrographic surgery (MMS).  The nature and purpose of the procedure, associated benefits and risks including recurrence and scarring, possible complications such as pain, infection, and bleeding, and alternative methods of treatment if appropriate were discussed with the patient during consent. The lesion location was verified by the patient, by reviewing previous notes, pathology reports, and by photographs as well as angulation measurements if available.  Informed consent was reviewed and signed by the patient, and timeout was performed at 9:00 AM. See op note below.  2. For the photodamage and solar lentigines, sun protection discussed/information given on OTC sunscreens, and we recommend continued regular follow-up with primary dermatologist every 6 months or sooner for any growing, bleeding, or changing lesions. 3. Prognosis and future surveillance discussed. 4. Letter with treatment outcome sent to referring provider. 5. Pain  acetaminophen/ibuprofen  MOHS MICROGRAPHIC SURGERY AND RECONSTRUCTION  Initial size:   0.8 x 0.5 cm Surgical defect/wound size: 1.2 x 1.0 cm Anesthesia:    0.33% lidocaine with 1:200,000 epinephrine EBL:    <5 mL Complications:  None Repair type:   Complex SQ suture:   5-0 Monocryl Cutaneous suture:  6-0 Plain gut Final size of the repair: 3.0 cm  Stages: 1  STAGE I: Anesthesia achieved with 0.5% lidocaine with 1:200,000 epinephrine. ChloraPrep applied. 1 section(s) excised using Mohs technique (this includes total peripheral and deep tissue margin excision and evaluation with frozen sections, excised and interpreted by the same physician). The tumor was first debulked and then excised with an approx. 2mm margin.  Hemostasis was achieved with electrocautery as needed.  The specimen was then oriented, subdivided/relaxed, inked, and processed using Mohs technique.    Frozen section analysis revealed a clear deep and peripheral margin.  Reconstruction  The surgical wound was then cleaned, prepped, and re-anesthetized as above. Wound edges were undermined extensively along at least one entire edge and at a distance equal to or greater than the width of the defect (see wound defect size above) in order to achieve closure and decrease wound tension and anatomic distortion. Redundant tissue repair including standing cone removal was performed. Hemostasis was achieved with electrocautery. Subcutaneous and epidermal tissues were approximated with the above sutures. The surgical site was then lightly scrubbed with sterile, saline-soaked gauze. The area was then bandaged using Vaseline ointment, non-adherent gauze, gauze pads, and tape to provide an adequate pressure dressing. The patient tolerated the procedure well, was given detailed written and verbal wound care instructions, and was discharged in good condition.  The patient will follow-up: 4 weeks.    Documentation: I have reviewed the above  documentation for accuracy and completeness, and I agree with the above.  [name], MD  "

## 2024-09-06 NOTE — Patient Instructions (Addendum)
 Primary Closure and Flap Wound Care Instructions Your wound was closed with stitches. Some stitches are under the skin and some are on top. Keep the white pressure bandage on and dry for 48 hours (72 hours if you take blood thinners). After removing the bandage: Gently wash the area with soap and water twice a day Apply a thin layer of Vaseline, Aquaphor, or petroleum jelly Keep doing this until: Stitches dissolve or are removed (about 7-10 days) DO NOT LET A SCAB FORM OVER YOUR WOUND! If a thick scab forms, you may gently clean the edges with a Q-tip and a small amount of hydrogen peroxide You should cover the wound with a non-stick bandage  IN THE EVENT OF AN AFTER HOURS EMERGENCY (PAIN, BLEEDING, INFECTION), PLEASE CALL OUR OFFICE AND YOU BE DIRECTED TO THE ON CALL STAFF. MYCHART MESSAGES SENT AFTER HOURS WILL BE ADDRESSED ON THE NEXT BUSINESS DAY  What to Watch For After Surgery Bleeding Some light bleeding is normal in the first 24 hours. To lower bleeding risk: Do not lift more than 10 pounds for 1 week If surgery was on your face, head, or neck, do not bend or stoop for 72 hours If surgery was on an arm or leg, keep it raised as much as possible (Limit standing and walking if it was on the leg) If bleeding happens: Press firmly on the area for 30 minutes without looking If bleeding continues, press again for another 30 minutes Call the office if bleeding does not stop  Swelling and Bruising Swelling and bruising are normal. Use an ice pack for 15 minutes each hour during the first 1-2 days Wrap ice in a towel to keep bandages dry Keep the area raised when possible Use extra pillows if surgery was on the head or neck Raise arms or legs near heart level if surgery was there  Infection (Uncommon) Call the office if you notice: Increasing pain Redness or swelling Yellow drainage (pus) Symptoms starting several days after surgery  Pain Control Some pain is normal. Rest,  ice, and elevation help You may take Tylenol  (acetaminophen ) and Ibuprofen (Advil/Motrin) Do not take aspirin  for pain If you already take daily aspirin , continue it, but do not add extra Safe option (if allowed): Take 2 ibuprofen (200 mg each) 3 hours later take 2 Tylenol  (325 mg each) Keep alternating every 3 hours as needed If you cannot take ibuprofen: Take Tylenol  650 mg every 4-6 hours EXAMPLE: Time Medicine Dose  6:00 AM Ibuprofen 400 mg (2 tablets of 200 mg)  9:00 AM Tylenol  650 mg (2 tablets of 325 mg)  12:00 PM Ibuprofen 400 mg  3:00 PM Tylenol  650 mg  6:00 PM Ibuprofen 400 mg  9:00 PM Tylenol  650 mg   Healing and Scar Questions When will my scar fade? It takes about 1 year for the scar to mature. Redness usually fades over time. Can my wound open? Yes, the area is weak for the first 6 months. Avoid pulling or stretching it. When will feeling return? Numbness or tingling can last 1-2 years. Some numbness may be permanent. Nose stuffiness If surgery was on your nose, stuffiness can last several months and will slowly improve. When can I stop wound care? You may stop once the skin is fully healed with no open areas. Makeup and sunscreen You may use makeup and sunscreen once: Stitches are removed, and The wound is fully healed Use sunscreen daily on healed skin.  Lumps, Puffiness, and Massage Small lumps  under the skin are normal and will go away A small pimple along the scar can happen Use warm compresses Call if it does not improve Puffy scars can sometimes be treated -- call the office if concerned After 1 month, gently massage the scar 2-3 times a day  Scar Products No Scar products should be applied until 1 month after your surgery Scar creams are optional Plain Vaseline works very well and is safe Stop any product that causes irritation  Future Skin Checks You may develop skin cancer again. See your dermatologist every 6-12 months Regular skin  checks help find problems early   Important Information   Due to recent changes in healthcare laws, you may see results of your pathology and/or laboratory studies on MyChart before the doctors have had a chance to review them. We understand that in some cases there may be results that are confusing or concerning to you. Please understand that not all results are received at the same time and often the doctors may need to interpret multiple results in order to provide you with the best plan of care or course of treatment. Therefore, we ask that you please give us  2 business days to thoroughly review all your results before contacting the office for clarification. Should we see a critical lab result, you will be contacted sooner.     If You Need Anything After Your Visit   If you have any questions or concerns for your doctor, please call our main line at [phone]. If no one answers, please leave a voicemail as directed and we will return your call as soon as possible. Messages left after 4 pm will be answered the following business day.    You may also send us  a message via MyChart. We typically respond to MyChart messages within 1-2 business days.  For prescription refills, please ask your pharmacy to contact our office. Our fax number is [phone].  If you have an urgent issue when the clinic is closed that cannot wait until the next business day, you can page your doctor at the number below.     Please note that while we do our best to be available for urgent issues outside of office hours, we are not available 24/7.    If you have an urgent issue and are unable to reach us , you may choose to seek medical care at your doctor's office, retail clinic, urgent care center, or emergency room.   If you have a medical emergency, please immediately call 911 or go to the emergency department. In the event of inclement weather, please call our main line at [phone] for an update on the  status of any delays or closures.  Dermatology Medication Tips: Please keep the boxes that topical medications come in in order to help keep track of the instructions about where and how to use these. Pharmacies typically print the medication instructions only on the boxes and not directly on the medication tubes.   If your medication is too expensive, please contact our office at [phone] or send us  a message through MyChart.    We are unable to tell what your co-pay for medications will be in advance as this is different depending on your insurance coverage. However, we may be able to find a substitute medication at lower cost or fill out paperwork to get insurance to cover a needed medication.    If a prior authorization is required to get your medication covered by your insurance company,  company, please allow us  1-2 business days to complete this process.   Drug prices often vary depending on where the prescription is filled and some pharmacies may offer cheaper prices.   The website www.goodrx.com contains coupons for medications through different pharmacies. The prices here do not account for what the cost may be with help from insurance (it may be cheaper with your insurance), but the website can give you the price if you did not use any insurance.  - You can print the associated coupon and take it with your prescription to the pharmacy.  - You may also stop by our office during regular business hours and pick up a GoodRx coupon card.  - If you need your prescription sent electronically to a different pharmacy, notify our office through [facility] or by phone at [phone]    "

## 2024-09-07 ENCOUNTER — Ambulatory Visit: Admitting: Family Medicine

## 2024-09-07 ENCOUNTER — Other Ambulatory Visit (HOSPITAL_COMMUNITY): Payer: Self-pay

## 2024-09-07 NOTE — Telephone Encounter (Signed)
 Pharmacy Patient Advocate Encounter  Received notification from [employer] that Prior Authorization for [facility] sensor 3 plus has been APPROVED from 09/04/2024 to 09/04/2025. Ran test claim, Copay is $00.0. This test claim was processed through [facility]- copay amounts may vary at other pharmacies due to pharmacy/plan contracts, or as the patient moves through the different stages of their insurance plan.   PA #/Case ID/Reference #: [id number]

## 2024-09-07 NOTE — Telephone Encounter (Signed)
MyChart read by pt

## 2024-09-07 NOTE — Telephone Encounter (Signed)
 Received Fax for clinical questions and completed with progress notes and faxed back to [facility].

## 2024-09-13 ENCOUNTER — Telehealth: Payer: Self-pay

## 2024-09-13 NOTE — Telephone Encounter (Signed)
 Home health PT orders received 09/13/24 for [facility] health initiation orders: Yes.  Home health re-certification orders: No. Patient last seen by ordering physician for this condition: 08/17/24. Must be less than 90 days for re-certification and less than 30 days prior for initiation. Visit must have been for the condition the orders are being placed.  Patient meets criteria for Physician to sign orders: Yes.        Current med list has been attached: No        Orders placed on physicians desk for signature: 09/13/24 (date) If patient does not meet criteria for orders to be signed: pt was called to schedule appt. Appt is scheduled for 10/17/24.  Placed on PCP desk to review and sign, if appropriate.    Jason [name] D [name]

## 2024-09-13 NOTE — Telephone Encounter (Signed)
 Received fax from [facility]   Order # [id number]   [place] front office inbox

## 2024-10-02 ENCOUNTER — Telehealth: Admitting: *Deleted

## 2024-10-16 ENCOUNTER — Encounter: Admitting: Dermatology

## 2024-10-17 ENCOUNTER — Ambulatory Visit: Admitting: Family Medicine

## 2024-11-01 ENCOUNTER — Ambulatory Visit: Admitting: Podiatry

## 2024-11-28 ENCOUNTER — Encounter

## 2025-02-06 ENCOUNTER — Ambulatory Visit: Admitting: Physician Assistant
